# Patient Record
Sex: Male | Born: 1935 | Race: Black or African American | Hispanic: No | Smoking: Former smoker
Health system: Southern US, Community
[De-identification: ages and names within clinical notes are randomized; demographics above are authoritative.]

## PROBLEM LIST (undated history)

## (undated) DIAGNOSIS — I509 Heart failure, unspecified: Secondary | ICD-10-CM

## (undated) DIAGNOSIS — J449 Chronic obstructive pulmonary disease, unspecified: Secondary | ICD-10-CM

## (undated) DIAGNOSIS — N189 Chronic kidney disease, unspecified: Secondary | ICD-10-CM

## (undated) DIAGNOSIS — R0689 Other abnormalities of breathing: Secondary | ICD-10-CM

## (undated) DIAGNOSIS — I10 Essential (primary) hypertension: Secondary | ICD-10-CM

## (undated) DIAGNOSIS — K219 Gastro-esophageal reflux disease without esophagitis: Secondary | ICD-10-CM

## (undated) HISTORY — DX: Chronic obstructive pulmonary disease, unspecified: J44.9

## (undated) HISTORY — DX: Chronic kidney disease, unspecified: N18.9

## (undated) HISTORY — DX: Essential (primary) hypertension: I10

## (undated) HISTORY — DX: Other abnormalities of breathing: R06.89

---

## 2015-05-10 ENCOUNTER — Encounter: Payer: Self-pay | Admitting: Pulmonary Disease

## 2015-05-10 ENCOUNTER — Ambulatory Visit (INDEPENDENT_AMBULATORY_CARE_PROVIDER_SITE_OTHER)
Admission: RE | Admit: 2015-05-10 | Discharge: 2015-05-10 | Disposition: A | Discharge status: Home / Self Care | Payer: Medicare Other | Source: Ambulatory Visit | Attending: Pulmonary Disease | Admitting: Pulmonary Disease

## 2015-05-10 ENCOUNTER — Other Ambulatory Visit (INDEPENDENT_AMBULATORY_CARE_PROVIDER_SITE_OTHER): Payer: Medicare Other

## 2015-05-10 ENCOUNTER — Ambulatory Visit (INDEPENDENT_AMBULATORY_CARE_PROVIDER_SITE_OTHER): Payer: Medicare Other | Admitting: Pulmonary Disease

## 2015-05-10 VITALS — BP 152/82 | HR 78 | Ht 67.0 in | Wt 174.4 lb

## 2015-05-10 DIAGNOSIS — J42 Unspecified chronic bronchitis: Secondary | ICD-10-CM

## 2015-05-10 DIAGNOSIS — I10 Essential (primary) hypertension: Secondary | ICD-10-CM | POA: Insufficient documentation

## 2015-05-10 DIAGNOSIS — J449 Chronic obstructive pulmonary disease, unspecified: Secondary | ICD-10-CM | POA: Insufficient documentation

## 2015-05-10 LAB — COMPREHENSIVE METABOLIC PANEL
ALT: 13 U/L (ref 0–53)
AST: 16 U/L (ref 0–37)
Albumin: 4.4 g/dL (ref 3.5–5.2)
Alkaline Phosphatase: 73 U/L (ref 39–117)
BUN: 40 mg/dL — ABNORMAL HIGH (ref 6–23)
CALCIUM: 10.4 mg/dL (ref 8.4–10.5)
CHLORIDE: 103 meq/L (ref 96–112)
CO2: 27 meq/L (ref 19–32)
Creatinine, Ser: 2.51 mg/dL — ABNORMAL HIGH (ref 0.40–1.50)
GFR: 31.52 mL/min — AB (ref >60.00)
Glucose, Bld: 102 mg/dL — ABNORMAL HIGH (ref 70–99)
POTASSIUM: 4.6 meq/L (ref 3.5–5.1)
Sodium: 140 mEq/L (ref 135–145)
Total Bilirubin: 0.3 mg/dL (ref 0.2–1.2)
Total Protein: 8.2 g/dL (ref 6.0–8.3)

## 2015-05-10 NOTE — Progress Notes (Signed)
Subjective:    Patient ID: Greg RobertsJonathan Jones, male    DOB: 04/12/1928, 81 y.o.   MRN: [id number]  HPI  81 [place], presents to establish care for COPD accompanied by his sister. He smoked about half pack per day for 70 years-about 35 pack years before he quit in 2014. He was admitted for a pneumonia to [facility] in 10/2014 and was seen by pulmonologist there. He was diagnosed with severe COPD. PFTs 10/2014 showed FEV1 of 0.84-39%, FVC of 2.1-67%, without significant broncho-dilator response. DLCO was relatively preserved at 73%. His wife passed away in June, and he moved to [place] to be with his sister. He also has hypertension and is maintained on benazepril. He also has CK D stage V per report  He reports dyspnea on walking stairs, can walk in the store but cannot walk for extended distance. Reports occasional wheezing. He reports a daily cough productive of clear sputum.  Spirometry today showed FEV1 of 34%-0.81 He is maintained on a regimen of Symbicort and Spiriva and uses albuterol on an as-needed basis. He reports some kind of reaction when he took albuterol nebs in the hospital and prefers not to use this  Past Medical History  Diagnosis Date  . COPD (chronic obstructive pulmonary disease) (HCC)   . Hypertension   . Chronic kidney disease   . Respiratory insufficiency     No past surgical history on file.  Allergies  Allergen Reactions  . Other     Oxygen Sensitive  . Penicillins Rash  . Vitamin B12 Itching and Rash    Social History   Social History  . Marital Status: Widowed    Spouse Name: N/A  . Number of Children: N/A  . Years of Education: N/A   Occupational History  . Not on file.   Social History Main Topics  . Smoking status: Former Smoker -- 0.50 packs/day for 70 years    Types: Cigarettes    Quit date: 04/19/2013  . Smokeless tobacco: Not on file  . Alcohol Use: No  . Drug Use: No    . Sexual Activity: Not on file   Other Topics Concern  . Not on file   Social History Narrative  . No narrative on file   Family history- sister is healthy, no CAD No family history on file.   Review of Systems  Constitutional: Negative for fever, chills, activity change, appetite change and unexpected weight change.  HENT: Negative for congestion, dental problem, postnasal drip, rhinorrhea, sneezing, sore throat, trouble swallowing and voice change.   Eyes: Negative for visual disturbance.  Respiratory: Positive for shortness of breath. Negative for cough and choking.   Cardiovascular: Negative for chest pain and leg swelling.  Gastrointestinal: Negative for nausea, vomiting and abdominal pain.  Genitourinary: Negative for difficulty urinating.  Musculoskeletal: Negative for arthralgias.  Skin: Negative for rash.  Psychiatric/Behavioral: Negative for behavioral problems and confusion.       Objective:   Physical Exam  Gen. Pleasant, well-nourished, in no distress, normal affect ENT - no lesions, no post nasal drip Neck: No JVD, no thyromegaly, no carotid bruits Lungs: no use of accessory muscles, no dullness to percussion, decreased bilateral without rales or rhonchi  Cardiovascular: Rhythm regular, heart sounds  normal, no murmurs or gallops, no peripheral edema Abdomen: soft and non-tender, no hepatosplenomegaly, BS normal. Musculoskeletal: No deformities, no cyanosis or clubbing Neuro:  alert, non focal  Assessment & Plan:

## 2015-05-10 NOTE — Assessment & Plan Note (Signed)
You have severe COPD STOP lotensin-HCTZ - we will substitute different medication Blood work & CXR today OK to take mucinex 600 mg twice daily ( or cough syrup 5ml twice daily) We discussed signs of a flare up You will benefit from a rehab program

## 2015-05-10 NOTE — Patient Instructions (Signed)
You have severe COPD STOP lotensin-HCTZ - we will substitute different medication Blood work & CXR today OK to take mucinex 600 mg twice daily ( or cough syrup 5ml twice daily) We discussed signs of a flare up You will benefit from a rehab program 

## 2015-05-10 NOTE — Assessment & Plan Note (Signed)
Blood pressure is slight high today We'll check creatinine-he also has a chronic cough-hence benazepril will need to be stopped If creatinine is okay, we will substitute with ARB, otherwise we will have to use amlodipine

## 2015-05-11 LAB — CBC WITH DIFFERENTIAL/PLATELET
Basophils Absolute: 0.1 10*3/uL (ref 0.0–0.1)
Basophils Relative: 0.6 % (ref 0.0–3.0)
EOS ABS: 0.4 10*3/uL (ref 0.0–0.7)
EOS PCT: 4 % (ref 0.0–5.0)
HEMATOCRIT: 43.1 % (ref 39.0–52.0)
Hemoglobin: 14 g/dL (ref 13.0–17.0)
LYMPHS PCT: 22.2 % (ref 12.0–46.0)
Lymphs Abs: 2.2 10*3/uL (ref 0.7–4.0)
MCHC: 32.4 g/dL (ref 30.0–36.0)
MCV: 82.9 fl (ref 78.0–100.0)
MONOS PCT: 9.2 % (ref 3.0–12.0)
Monocytes Absolute: 0.9 10*3/uL (ref 0.1–1.0)
NEUTROS ABS: 6.4 10*3/uL (ref 1.4–7.7)
Neutrophils Relative %: 64 % (ref 43.0–77.0)
PLATELETS: 292 10*3/uL (ref 150.0–400.0)
RBC: 5.2 Mil/uL (ref 4.22–5.81)
RDW: 16.2 % — AB (ref 11.5–15.5)
WBC: 10.1 10*3/uL (ref 4.0–10.5)

## 2015-05-14 MED ORDER — AMLODIPINE BESYLATE 10 MG PO TABS: 10.0000 mg | ORAL_TABLET | Freq: Every day | ORAL | Status: DC

## 2015-05-17 ENCOUNTER — Telehealth: Payer: Self-pay | Admitting: Pulmonary Disease

## 2015-05-17 NOTE — Telephone Encounter (Signed)
[facility]. They received order on pt but since pt does not have PFT's with post bronch then 1) pt will need to be set up for PFT w/ post bronch or 2) change DX to emphysema. Please advise RA thanks

## 2015-05-18 NOTE — Telephone Encounter (Signed)
Called and spoke with Kirt BoysMolly Faxed a copy of the PFT w/ Post Bronch to [facility]. Nothing further needed. Closing encounter

## 2015-05-18 NOTE — Telephone Encounter (Signed)
PFTs with post bronch available (from diff hospital ) - pl look at scan folder & send them copy

## 2015-05-24 ENCOUNTER — Telehealth (HOSPITAL_COMMUNITY): Payer: Self-pay

## 2015-05-24 NOTE — Telephone Encounter (Signed)
Called patient to discuss Pulmonary Rehab referral. Patient is interested and we are currently verifying insurance.  Will follow up with patient once insurance is verified.

## 2015-05-28 ENCOUNTER — Encounter (HOSPITAL_COMMUNITY): Payer: Self-pay

## 2015-05-28 ENCOUNTER — Encounter (HOSPITAL_COMMUNITY)
Admission: RE | Admit: 2015-05-28 | Discharge: 2015-05-28 | Disposition: A | Discharge status: Home / Self Care | Payer: Medicare Other | Source: Ambulatory Visit | Attending: Pulmonary Disease | Admitting: Pulmonary Disease

## 2015-05-28 DIAGNOSIS — J449 Chronic obstructive pulmonary disease, unspecified: Secondary | ICD-10-CM | POA: Insufficient documentation

## 2015-05-28 DIAGNOSIS — K219 Gastro-esophageal reflux disease without esophagitis: Secondary | ICD-10-CM | POA: Insufficient documentation

## 2015-05-28 HISTORY — DX: Gastro-esophageal reflux disease without esophagitis: K21.9

## 2015-05-28 NOTE — Progress Notes (Signed)
Greg RobertsJonathan Jones 81 y.o. male Pulmonary Rehab Orientation Note 13:30-16:00 Patient arrived today in Cardiac and Pulmonary Rehab for orientation to Pulmonary Rehab. He was transported [facility] via wheel chair accompanied by his sister. He has not been prescribed oxygen for home use. Color good, skin warm and dry. Patient is oriented to time and place. Patient's medical history, psychosocial health, and medications reviewed. Psychosocial assessment reveals pt his sister. [place]. His wife died in June of this year and he decided to move to [place] to be closer to his sisters. He has one biological son who he talks with every day that still lives in [place]. He [place] the [place] [facility] [place]. Pt is currently retired. 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 Pt states he really has no hobbies. He denies depression but lays in bed most of the day and watches TV. He is hopeful that he will make new friends in pulmonary rehab.  Pt reports his stress level is low. Areas of stress/anxiety include Health.  Pt does exhibits signs of depression. Signs of depression include hypersomnia. PHQ2/9 score 0/0. Pt shows good  coping skills with positive outlook . He was offered emotional support and reassurance. Will continue to monitor and evaluate progress toward psychosocial goal(s). Physical assessment reveals heart rate is normal, S1S2 present. Breath sounds clear to auscultation, no wheezes, rales, or rhonchi, moderate to severe expiratory wheezing heard diffusely throughout both lungs. He was encouraged to use his rescue inhaler during assessment. It is noted that patient did not use inhaler properly. Technique reviewed and patient able to return successful demonstration for second puff. Wheezing improved after use and patient stated his breathing felt much better. Grip strength equal, strong. Distal pulses palpable. No edema  noted. Patient reports he does take medications as prescribed. Patient states he follows a Regular diet. The patient reports no specific efforts to gain or lose weight.. Patient's weight will be monitored closely. Demonstration and practice of PLB using pulse oximeter. Patient able to return demonstration satisfactorily. Safety and hand hygiene in the exercise area reviewed with patient. Patient voices understanding of the information reviewed. Department expectations discussed with patient and achievable goals were set. The patient shows enthusiasm about attending the program and we look forward to working with this nice gentleman. The patient is scheduled for a 6 min walk test on Tuesday 12/13 at 3:30, a face to face with the pulmonary rehab medical director on Thursday 12/15 at 10:30 and to begin exercise on Tuesday 12/20 in the 10:30 class.   45 minutes was spent on a variety of activities such as assessment of the patient, obtaining baseline data including height, weight, BMI, and grip strength, verifying medical history, allergies, and current medications, and teaching patient strategies for performing tasks with less respiratory effort with emphasis on pursed lip breathing.

## 2015-06-01 ENCOUNTER — Encounter (HOSPITAL_COMMUNITY)
Admission: RE | Admit: 2015-06-01 | Discharge: 2015-06-01 | Disposition: A | Discharge status: Home / Self Care | Payer: Medicare Other | Source: Ambulatory Visit | Attending: Pulmonary Disease | Admitting: Pulmonary Disease

## 2015-06-01 NOTE — Progress Notes (Addendum)
Greg Jones completed a Six-Minute Walk Test on 06/01/15 . Greg Jones walked 516 feet with 2 breaks lasting 1 minute and 15 seconds total.  The patient's lowest oxygen saturation was 93 %, highest heart rate was 124 bpm , and highest blood pressure was 190/80. The patient was on room air. Patient stated that nothing hindered their walk test.

## 2015-06-03 ENCOUNTER — Encounter (HOSPITAL_COMMUNITY)
Admission: RE | Admit: 2015-06-03 | Discharge: 2015-06-03 | Disposition: A | Discharge status: Home / Self Care | Payer: Medicare Other | Source: Ambulatory Visit | Attending: Pulmonary Disease | Admitting: Pulmonary Disease

## 2015-06-03 NOTE — Outcomes Assessment (Cosign Needed)
[facility] Baseline Outcomes Assessment   Anthropometrics:  . Height (inches): 67.5 in . Weight (kg): 80.2 kg . Grip strength was measured using a Dynamometer.  The patient's highest score was a 35.  Functional Status/Exercise Capacity: . Greg Jones had a resting heart rate of 93 BPM, a resting blood pressure of 178/70, and an oxygen saturation of 98 % on RA liters of O2.  Greg Jones performed a 6-minute walk test on 06/01/15.  The patient completed 516 feet in 6 minutes with 2 rest breaks.  This quantifies 1.92 METS.   Dyspnea Measures: . The [facility] is a simple and standardized method of classifying disability in patients with COPD.  The assessment correlates disability and dyspnea.  At entrance the patient scored a 4. The scale is provided below.   0= I only get breathless with strenuous exercise. 1= I get short of breath when hurrying on level ground or walking up a slight incline. 2= On level ground, I walk slower than people of the same age because of breathlessness, or have to stop for breath when walking at my own pace. 3= I stop for breath after walking 100 yards or after a few minutes on level ground. 4=I am too breathless to leave the house or I am breathless when dressing.   . The patient completed the [facility] Shortness Of Breath Questionnaire ([facility] [place]).  This questionnaire relates activities of daily living and shortness of breath.  The score ranges from 0-120, a higher score relates to severe shortness of breath during activities of daily living. The patient's score at entrance was 86.  Quality of Life: . Ferrans and Powers Quality of Life Index Pulmonary Version is used to assess the patients satisfaction in different domains of their life; health and functioning, socioeconomic, psychological/spiritual, and family. The overall score is recorded out of 30 points.  The patient's goal is to achieve an overall  score of 21 or higher.  [place] received a 18.00 at entrance.  . The Patient Health Questionnaire (PHQ-2) is a first step approach for the screening of depression.  If the patient scores positive on the PHQ-2 the patient should be further assessed with the PHQ-9.  The Patient Health Questionnaire (PHQ-9) assesses the degree of depression.  Depression is important to monitor and track in pulmonary patients due to its prevalence in the population.  If the patient advances to the PHQ-9 the goal is to score less than 4 on this assessment.  [place] scored a 0 at entrance.  Clinical Assessment Tools: . The COPD Assessment Test (CAT) is a measurement tool to quantify how much of an impact the disease has on the patient's life.  This assessment aids the Pulmonary Rehab Team in designing the patients individualized treatment plan.  A CAT score ranges from 0-40.  A score of 10 or below indicates that COPD has a low impact on the patient's life whereas a score of 30 or higher indicates a severe impact. The patient's goal is a decrease of 1 point from entrance to discharge.  Greg Jones had a CAT score of 33 at entrance.  Nutrition: . The "Rate My Plate" is a dietary assessment that quantifies the balance of a patient's diet.  This tool allows the Pulmonary Rehab Team to key in on the areas of the patient's diet that needs improving.  The team can then focus their nutritional education on those areas.  If the patient scores 24-40, this means  there are many ways they can make their eating habits healthier, 41-57 states that there are some ways they can make their eating habits healthier and a score of 58-72 states that they are making many healthy choices.  The patient's goal is to achieve a score of 49 or higher on this assessment.  Greg Jones scored a 44 at entrance.  Oxygen Compliance: . Patient is currently on RA at rest, RA at night, and RA for exercise.  Greg Jones has currently not been prescribed a cpap/bipap at  night.  Education: . Greg Jones will attend education classes during the course of Pulmonary Rehab.  Education classes that will be offered to the patient are Activities of Daily Living and Energy Conservation, Pursed Lip Breathing and Diaphragmatic Breathing, Nutrition, Exercise for the Pulmonary Patient, Warning Signs of Infection, Chronic Lung Disease, Advanced Directives, Medications, and Stress and Meditation.  The patient completed an assessment at the entrance of the program and will complete it again upon discharge to demonstrate the level of understanding provided by the educational classes.  This assessment includes 14 questions regarding all of the education topics above.  Greg Jones achieved a score of 13/13 at entrance.  Smoking Cessation:  Patient is not currently a smoker.  Exercise: . Greg Jones will be provided with an individualized Home Exercise Prescription (HEP) at the entrance of the program.  The patient will be followed by the Pulmonary Exercise Physiologist throughout the program to assist with the progression of the frequency, intensity, time, and type of exercise. The patient's long-term goal is to be exercising 30-60 minutes, 3-5 days per week. At entrance, the patient was exercising 0 days at home.

## 2015-06-03 NOTE — Progress Notes (Signed)
Initial Pulmonary Rehab Evaluation  S: Patient reports able to walk about 100 feet before having to stop for SOB.  No oxygen at home.  Reason for stopping is SOB only, no CP and no joint problem.  O:  Chronically ill appearing male. HEENT: [place]/AT, PERRL, EOM-I and MMM. Heart: RRR, Nl S1/S2, -M/R/G. Lung: Diminished BS diffusely. Abd: Soft, NT, ND and +BS. Ext: -edema and -tenderness.  CXR: I reviewed myself, hyperinflation.  A/P: 81 year old male with COPD presenting for pulmonary rehab, only limitation is SOB, no CP or joint discomfort.  May proceed with pulmonary rehab plans.  Alyson ReedyWesam G. Yacoub, M.D. [facility]monary/[facility] Care Medicine. Pager: [phone]. After hours pager: [phone].

## 2015-06-07 ENCOUNTER — Ambulatory Visit (INDEPENDENT_AMBULATORY_CARE_PROVIDER_SITE_OTHER): Payer: Medicare Other | Admitting: Adult Health

## 2015-06-07 ENCOUNTER — Encounter: Payer: Self-pay | Admitting: Adult Health

## 2015-06-07 VITALS — BP 116/70 | HR 84 | Temp 98.4°F | Ht 67.0 in | Wt 178.0 lb

## 2015-06-07 DIAGNOSIS — I10 Essential (primary) hypertension: Secondary | ICD-10-CM

## 2015-06-07 DIAGNOSIS — J449 Chronic obstructive pulmonary disease, unspecified: Secondary | ICD-10-CM

## 2015-06-07 NOTE — Assessment & Plan Note (Signed)
Severe COPD  Plan  Avoid ACE inhibitors if possible in future  Continue on Symbicort and Spiriva daily .  Begin Pulmonary rehab as planned.  Follow up with Primary MD for blood pressure management .  Follow up with Dr. Vassie LollAlva  In 3-4 months and As needed.

## 2015-06-07 NOTE — Assessment & Plan Note (Signed)
Blood pressure is controlled on Norvasc. avoid ACE inhibitor's if possible Follow-up primary care physician in the next few weeks for ongoing blood pressure management.

## 2015-06-07 NOTE — Patient Instructions (Addendum)
Continue on Symbicort and Spiriva daily .  Begin Pulmonary rehab as planned.  Follow up with Primary MD for blood pressure management .  Follow up with Dr. Vassie LollAlva  In 3-4 months and As needed.

## 2015-06-07 NOTE — Progress Notes (Signed)
Subjective:    Patient ID: Greg RobertsJonathan Jones, male    DOB: 11/03/1927, 81 y.o.   MRN: [id number]  HPI 81 year old male former smoker with severe COPD seen for pulmonary consult 05/10/2015 by Dr. Vassie LollAlva  TEST :  PFTs 10/2014 showed FEV1 of 0.84-39%, FVC of 2.1-67%, without significant broncho-dilator response. DLCO was relatively preserved at 73%. 04/2015 Spirometry today showed FEV1 of 34%-0.81  06/07/2015 Follow up : COPD  Patient returns for a one-month follow-up. Patient was seen last visit for severe COPD. FEV1 is at 34%.[name]. She was started on Symbicort and Spiriva.. Was changed off of his ACE inhibitor to Norvasc. Blood pressure is well-controlled. Patient does have known chronic stage IV kidney disease reported. His creatinine was 2.5. Patient says his cough might be slightly improved since last visit. We discussed follow up with his primary care doctor for further blood pressure management. Labs from last visit. Will be faxed to his primary care physician . Patient says overall he is doing okay. He does get winded with activity. Has a daily cough. He denies any hemoptysis, orthopnea, PND or leg swelling. Does request a [facility] to the [facility] for a pulse oximeter and walker. He is beginning pulmonary rehabilitation tomorrow.   Past Medical History  Diagnosis Date  . COPD (chronic obstructive pulmonary disease) (HCC)   . Hypertension   . Chronic kidney disease   . Respiratory insufficiency   . GERD (gastroesophageal reflux disease)    Current Outpatient Prescriptions on File Prior to Visit  Medication Sig Dispense Refill  . amLODipine (NORVASC) 10 MG tablet Take 1 tablet (10 mg total) by mouth daily. 30 tablet 1  . budesonide-formoterol (SYMBICORT) 160-4.5 MCG/ACT inhaler Inhale 2 puffs into the lungs 2 (two) times daily.    . cholecalciferol (VITAMIN D) 400 UNITS TABS tablet Take 400 Units by mouth 2 (two) times daily.    [name]. omeprazole (PRILOSEC) 20 MG capsule Take 20 mg by  mouth daily.    [name]. tiotropium (SPIRIVA) 18 MCG inhalation capsule Place 18 mcg into inhaler and inhale daily.    [name]. albuterol (PROVENTIL) (2.5 MG/3ML) 0.083% nebulizer solution Take 2.5 mg by nebulization every 6 (six) hours as needed for wheezing or shortness of breath. Reported on 06/07/2015    . prochlorperazine (COMPAZINE) 5 MG tablet Take 5 mg by mouth every 6 (six) hours as needed for nausea or vomiting. Reported on 06/07/2015     No current facility-administered medications on file prior to visit.      Review of Systems Constitutional:   No  weight loss, night sweats,  Fevers, chills,  +fatigue, or  lassitude.  HEENT:   No headaches,  Difficulty swallowing,  Tooth/dental problems, or  Sore throat,                No sneezing, itching, ear ache, nasal congestion, post nasal drip,   CV:  No chest pain,  Orthopnea, PND, swelling in lower extremities, anasarca, dizziness, palpitations, syncope.   GI  No heartburn, indigestion, abdominal pain, nausea, vomiting, diarrhea, change in bowel habits, loss of appetite, bloody stools.   Resp:    No chest wall deformity  Skin: no rash or lesions.  GU: no dysuria, change in color of urine, no urgency or frequency.  No flank pain, no hematuria   MS:  No joint pain or swelling.  No decreased range of motion.  No back pain.  Psych:  No change in mood or affect. No depression or anxiety.  No memory loss.         Objective:   Physical Exam  GEN: A/Ox3; pleasant , NAD, [name] chronically ill-appearing  HEENT:  [place]/AT,  EACs-clear, TMs-wnl, NOSE-clear, THROAT-clear, no lesions, no postnasal drip or exudate noted.   NECK:  Supple w/ fair ROM; no JVD; normal carotid impulses w/o bruits; no thyromegaly or nodules palpated; no lymphadenopathy.  RESP  Decreased BS in bases .no accessory muscle use, no dullness to percussion  CARD:  RRR, no m/r/g  , no peripheral edema, pulses intact, no cyanosis or clubbing.  GI:   Soft & nt; nml bowel  sounds; no organomegaly or masses detected.  Musco: Warm bil, no deformities or joint swelling noted.   Neuro: alert, no focal deficits noted.    Skin: Warm, no lesions or rashes '      Assessment & Plan:

## 2015-06-08 ENCOUNTER — Encounter (HOSPITAL_COMMUNITY)
Admission: RE | Admit: 2015-06-08 | Discharge: 2015-06-08 | Disposition: A | Discharge status: Home / Self Care | Payer: Medicare Other | Source: Ambulatory Visit | Attending: Pulmonary Disease | Admitting: Pulmonary Disease

## 2015-06-08 DIAGNOSIS — J449 Chronic obstructive pulmonary disease, unspecified: Secondary | ICD-10-CM | POA: Diagnosis present

## 2015-06-09 ENCOUNTER — Ambulatory Visit: Payer: Medicare Other | Admitting: Adult Health

## 2015-06-10 ENCOUNTER — Encounter (HOSPITAL_COMMUNITY)
Admission: RE | Admit: 2015-06-10 | Discharge: 2015-06-10 | Disposition: A | Discharge status: Home / Self Care | Payer: Medicare Other | Source: Ambulatory Visit | Attending: Pulmonary Disease | Admitting: Pulmonary Disease

## 2015-06-10 DIAGNOSIS — J449 Chronic obstructive pulmonary disease, unspecified: Secondary | ICD-10-CM | POA: Diagnosis not present

## 2015-06-10 NOTE — Progress Notes (Signed)
Today, Greg Jones exercised at [facility]. Service time was from 10:30am to 12:00pm.  The patient exercised by performing aerobic, strengthening, and stretching exercises. Oxygen saturation, heart rate, blood pressure, rate of perceived exertion, and shortness of breath were all monitored before, during, and after exercise. Greg Jones presented with no problems at today's exercise session. The patient attended education today with [facility]ston Trevino on Pursed Lip and Diaphragmatic Breathing.  The patient did not have an increase in workload intensity during today's exercise session.  Pre-exercise vitals: . Weight kg: 80.8 . Liters of O2: ra . SpO2: 95 . HR: 91 . BP: 124/62 . CBG: na  Exercise vitals: . Highest heartrate:  114 . Lowest oxygen saturation: 96 . Highest blood pressure: 118/70 . Liters of 02: ra  Post-exercise vitals: . SpO2: 99 . HR: 97 . BP: 112/70 . Liters of O2: ra . CBG: na  Dr. Alyson ReedyWesam G. Yacoub, Medical Director Dr. Gonzella Lexhungel is immediately available during today's Pulmonary Rehab session for Greg Jones on 06/10/15 at 10:30am class time.

## 2015-06-15 ENCOUNTER — Encounter (HOSPITAL_COMMUNITY)
Admission: RE | Admit: 2015-06-15 | Discharge: 2015-06-15 | Disposition: A | Discharge status: Home / Self Care | Payer: Medicare Other | Source: Ambulatory Visit | Attending: Pulmonary Disease | Admitting: Pulmonary Disease

## 2015-06-15 DIAGNOSIS — J449 Chronic obstructive pulmonary disease, unspecified: Secondary | ICD-10-CM | POA: Diagnosis not present

## 2015-06-15 NOTE — Progress Notes (Addendum)
Today, Greg Jones exercised at [facility]. Service time was from 1030 to 1155.  The patient exercised by performing aerobic, strengthening, and stretching exercises. Oxygen saturation, heart rate, blood pressure, rate of perceived exertion, and shortness of breath were all monitored before, during, and after exercise. Greg Jones presented with no problems at today's exercise session.  The patient did have an increase in workload intensity during today's exercise session.  Pre-exercise vitals: . Weight kg: 81.2 . Liters of O2: ra . SpO2: 94 . HR: 86 . BP: 120/54 . CBG: na  Exercise vitals: . Highest heartrate:  108 . Lowest oxygen saturation: 94 . Highest blood pressure: 126/70 . Liters of 02: ra  Post-exercise vitals: . SpO2: 93 . HR: 99 . BP: 104/62 . Liters of O2: ra . CBG: na  Dr. Alyson ReedyWesam G. Yacoub, Medical Director Dr. Konrad DoloresMerrell is immediately available during today's Pulmonary Rehab session for Greg Jones on 06/15/2015 at 1030 class time

## 2015-06-17 ENCOUNTER — Encounter (HOSPITAL_COMMUNITY)
Admission: RE | Admit: 2015-06-17 | Discharge: 2015-06-17 | Disposition: A | Discharge status: Home / Self Care | Payer: Medicare Other | Source: Ambulatory Visit | Attending: Pulmonary Disease | Admitting: Pulmonary Disease

## 2015-06-17 DIAGNOSIS — J449 Chronic obstructive pulmonary disease, unspecified: Secondary | ICD-10-CM | POA: Diagnosis not present

## 2015-06-17 NOTE — Progress Notes (Addendum)
Today, Greg Jones exercised at [facility]. Service time was from 10:30am to 12:20pm.  The patient exercised by performing aerobic, strengthening, and stretching exercises. Oxygen saturation, heart rate, blood pressure, rate of perceived exertion, and shortness of breath were all monitored before, during, and after exercise. Greg Jones presented with no problems at today's exercise session. The patient attended education class on Nutrition. Greg Jones also met with Dr. Nelda Marseille, Medical Director for Pulmonary rehab for his 30 day face to face.   The patient did not have an increase in workload intensity during today's exercise session.  Pre-exercise vitals: . Weight kg: 81.1 . Liters of O2: ra . SpO2: 98 . HR: 98 . BP: 128/52 . CBG: na  Exercise vitals: . Highest heartrate:  120 . Lowest oxygen saturation: 93 . Highest blood pressure: 134/80 . Liters of 02: ra  Post-exercise vitals: . SpO2: 95 . HR: 101 . BP: 110/66 . Liters of O2: ra . CBG: na  Dr. Rush Farmer, Medical Director Dr. Darrick Meigs is immediately available during today's Pulmonary Rehab session for Greg Jones on 06/17/15 at 10:30am class time

## 2015-06-22 ENCOUNTER — Encounter (HOSPITAL_COMMUNITY)
Admission: RE | Admit: 2015-06-22 | Discharge: 2015-06-22 | Disposition: A | Discharge status: Home / Self Care | Payer: Medicare Other | Source: Ambulatory Visit | Attending: Pulmonary Disease | Admitting: Pulmonary Disease

## 2015-06-22 DIAGNOSIS — J449 Chronic obstructive pulmonary disease, unspecified: Secondary | ICD-10-CM | POA: Diagnosis present

## 2015-06-22 NOTE — Progress Notes (Signed)
Reviewed & agree with plan  

## 2015-06-22 NOTE — Progress Notes (Signed)
Today, Greg Jones exercised at [facility]. Service time was from 10:30am to 12:05pm.  The patient exercised by performing aerobic, strengthening, and stretching exercises. Oxygen saturation, heart rate, blood pressure, rate of perceived exertion, and shortness of breath were all monitored before, during, and after exercise. Greg Jones presented with no problems at today's exercise session.  The patient did not have an increase in workload intensity during today's exercise session.  Pre-exercise vitals: . Weight kg: 81.6 . Liters of O2: ra . SpO2: 94 . HR: 96 . BP: 142/74 . CBG: na  Exercise vitals: . Highest heartrate:  109 . Lowest oxygen saturation: 94 . Highest blood pressure: 144/84 . Liters of 02: ra  Post-exercise vitals: . SpO2: 94 . HR: 104 . BP: 134/60 . Liters of O2: ra . CBG: na  Dr. Alyson ReedyWesam G. Yacoub, Medical Director Dr. Konrad DoloresMerrell is immediately available during today's Pulmonary Rehab session for Greg Jones on 06/22/15 at 10:30am class time

## 2015-06-24 ENCOUNTER — Encounter (HOSPITAL_COMMUNITY)
Admission: RE | Admit: 2015-06-24 | Discharge: 2015-06-24 | Disposition: A | Discharge status: Home / Self Care | Payer: Medicare Other | Source: Ambulatory Visit | Attending: Pulmonary Disease | Admitting: Pulmonary Disease

## 2015-06-24 DIAGNOSIS — J449 Chronic obstructive pulmonary disease, unspecified: Secondary | ICD-10-CM | POA: Diagnosis not present

## 2015-06-24 NOTE — Progress Notes (Signed)
Today, Greg Jones exercised at [facility]. Service time was from 1030 to 1200.  The patient exercised by performing aerobic, strengthening, and stretching exercises. Oxygen saturation, heart rate, blood pressure, rate of perceived exertion, and shortness of breath were all monitored before, during, and after exercise. Greg Jones presented with no problems at today's exercise session. Greg Jones also attended an education session on risk factor reduction.  The patient did have an increase in workload intensity during today's exercise session.  Pre-exercise vitals: . Weight kg: 81.3 . Liters of O2: ra . SpO2: 96 . HR: 96 . BP: 108/72 . CBG: na  Exercise vitals: . Highest heartrate:  111 . Lowest oxygen saturation: 97 . Highest blood pressure: 108/70 . Liters of 02: ra  Post-exercise vitals: . SpO2: 94 . HR: 95 . BP: 136/64 after albuterol use . Liters of O2: ra . CBG: na  Dr. Alyson ReedyWesam G. Yacoub, Medical Director Dr. Konrad DoloresMerrell is immediately available during today's Pulmonary Rehab session for Cherlyn RobertsJonathan Dukes on 06/24/2015 at 1030 class time

## 2015-06-25 ENCOUNTER — Telehealth: Payer: Self-pay | Admitting: Pulmonary Disease

## 2015-06-25 MED ORDER — PREDNISONE 10 MG PO TABS: ORAL_TABLET | ORAL | Status: DC

## 2015-06-25 NOTE — Telephone Encounter (Signed)
Called spoke with spouse and she is aware of recs. RX for prednisone sent in. Nothing further needed

## 2015-06-25 NOTE — Telephone Encounter (Signed)
Called spoke with spouse. She reports pt has been having increase SOB w/ exertion, prod cough (clear phlem), wheezing but no chest tx. They are wanting an RX called in to have on hand since snow is expected. RA NA. Please advise MW thanks

## 2015-06-25 NOTE — Telephone Encounter (Signed)
Prednisone 10 mg take  4 each am x 2 days,   2 each am x 2 days,  1 each am x 2 days and stop  

## 2015-06-28 ENCOUNTER — Telehealth (HOSPITAL_COMMUNITY): Payer: Self-pay | Admitting: *Deleted

## 2015-06-29 ENCOUNTER — Encounter (HOSPITAL_COMMUNITY): Admission: RE | Admit: 2015-06-29 | Payer: Medicare Other | Source: Ambulatory Visit

## 2015-06-29 NOTE — Progress Notes (Signed)
Late Entry  Today, Greg Jones exercised at [facility]. Service time was from 1030 to 1210.  The patient exercised by performing aerobic, strengthening, and stretching exercises. Oxygen saturation, heart rate, blood pressure, rate of perceived exertion, and shortness of breath were all monitored before, during, and after exercise. Greg Jones presented with no problems at today's exercise session.  The patient did not have an increase in workload intensity during today's exercise session.  Pre-exercise vitals: . Weight kg: 81.1 . Liters of O2: RA . SpO2: 95 . HR: 59 . BP: 108/72 . CBG: NA  Exercise vitals: . Highest heartrate:  111 . Lowest oxygen saturation: 91 . Highest blood pressure: 108/70 . Liters of 02: RA  Post-exercise vitals: . SpO2: 94 . HR: 95 . BP: 136/64 . Liters of O2: RA . CBG: NA Dr. Alyson ReedyWesam G. Yacoub, Medical Director Dr. Konrad DoloresMerrell is immediately available during today's Pulmonary Rehab session for Greg RobertsJonathan Jones on 12/20/2016at 1030 class time  .  .[name]

## 2015-07-01 ENCOUNTER — Encounter (HOSPITAL_COMMUNITY)
Admission: RE | Admit: 2015-07-01 | Discharge: 2015-07-01 | Disposition: A | Discharge status: Home / Self Care | Payer: Medicare Other | Source: Ambulatory Visit | Attending: Pulmonary Disease | Admitting: Pulmonary Disease

## 2015-07-01 DIAGNOSIS — J449 Chronic obstructive pulmonary disease, unspecified: Secondary | ICD-10-CM | POA: Diagnosis not present

## 2015-07-01 NOTE — Progress Notes (Signed)
Today, Greg Jones exercised at [facility]. Service time was from 1030 to 1215.  The patient exercised by performing aerobic, strengthening, and stretching exercises. Oxygen saturation, heart rate, blood pressure, rate of perceived exertion, and shortness of breath were all monitored before, during, and after exercise. Greg Jones presented with no problems at today's exercise session. He attended stress management class today.  The patient did not have an increase in workload intensity during today's exercise session.  Pre-exercise vitals: . Weight kg: 86.8 . Liters of O2: RA . SpO2: 96 . HR: 89 . BP: 139/60 . CBG: NA  Exercise vitals: . Highest heartrate:  107 . Lowest oxygen saturation: 93 . Highest blood pressure: 142/70 . Liters of 02: RA  Post-exercise vitals: . SpO2: 97 . HR: 88 . BP: 130/70 . Liters of O2: RA . CBG: NA Dr. Alyson ReedyWesam G. Yacoub, Medical Director Dr. Randol KernElgergawy is immediately available during today's Pulmonary Rehab session for Greg Jones on 07/01/2015  at 1030 class time.  .[name]

## 2015-07-01 NOTE — Progress Notes (Signed)
Greg RobertsJonathan Jones 82 y.o. male Nutrition Note Spoke with pt and pt's sister. There are some ways the pt can make his eating habits healthier. Pt's Rate Your Plate results reviewed with pt. Age-appropriate nutrition recommendations discussed.  No results found for: HGBA1C  Nutrition Diagnosis ? Food-and nutrition-related knowledge deficit related to lack of exposure to information as related to diagnosis of pulmonary disease ?  Nutrition Intervention ? Pt's individual nutrition plan and goals reviewed with pt. ? Benefits of adopting healthy eating habits discussed when pt's Rate Your Plate reviewed. ? Pt to attend the Nutrition and Lung Disease class ? Continual client-centered nutrition education by RD, as part of interdisciplinary care. Goal(s) 1. Describe the benefit of including fruits, vegetables, whole grains, and low-fat dairy products in a healthy meal plan. Monitor and Evaluate progress toward nutrition goal with team.   Mickle PlumbEdna Jaedynn Bohlken, M.Ed, RD, LDN, CDE 07/01/2015 11:34 AM

## 2015-07-01 NOTE — Psychosocial Assessment (Signed)
Greg RobertsJonathan Jones 82 y.o. male  30 day Psychosocial Note  Patient psychosocial assessment reveals barriers to participation in Pulmonary Rehab, although he has only missed 1 session in the past 30 days and this was due to inclimate weather. Psychosocial areas that are currently affecting patient's rehab experience include concerns about treatment decisions and the progression of his COPD. He has also recently moved to the area and is living with his sister. He has not social connections to [place] friends in [place] except by phone. He does stated he has a loss of interest in usual activities, and does not get out of bed during the day except on rehab days. This is in part to his coughing and wheezing. He states he does not cough or wheeze as much when he is lying still. This will prevent him from following his home exercise instructions. He also has [facility] to discuss better treatment options to manage his wheezing and coughing. Patient does not to exhibit positive coping skills to deal with his psychosocial concerns.He verbalized that he does but his sister is concerned about how much he remains in bed and watches TV. They will also discuss depression symptoms with his primary care tomorrow. Offered emotional support and reassurance. Patient does not feel he is making progress toward Pulmonary Rehab goals. Patient reports his health and activity level has not improved in the past 30 days as evidenced by patient's report of unchanged ability to exert himself without significant shortness of breath and wheezing. Patient states his sister has not noticed changes in his activity or mood. Patient reports not feeling positive about current and projected progression in Pulmonary Rehab. After reviewing the patient's treatment plan, the patient is not making progress toward Pulmonary Rehab goals. Hopefully his coughing and wheezing can be managed with other treatment options  and he can began to exert himself with less respiratory effects which will greatly improve his psychosocial health. Patient's rate of progress toward rehab goals is poor at this time Plan of action to help patient continue to work towards rehab goals include working closely with MD to determine best treatment for patient and to increase workloads on equipment as tolerated in order to increase his stamina and stregnth. Will continue to monitor and evaluate progress toward psychosocial goal(s).  Goal(s) in progress: Improved management of stress, depression, anxiety related to his COPD  Improved coping skills  Help patient work toward returning to meaningful activities that improve patient's QOL and are attainable with patient's lung disease

## 2015-07-01 NOTE — Progress Notes (Signed)
Pt completed Quality of Life survey as a participant in Pulmonary Rehab. Scores 21.0 or below are considered low. Pt score very low in several areas Overall 18.0, Health and Function 12.4, family 16.5.  Discussed in detail with patient. Patient feels his disease has progressed to the point that exercise may not help, but if patient were to see an improvement in his shortness of breath and stamina and strength, he feels his quality of life would improve significantly. He has an appointment with his MD tomorrow to discuss possible medication changes that would improve the amount of wheezing he does with minimal exertion. Will continue to follow patients quality of life.

## 2015-07-06 ENCOUNTER — Encounter (HOSPITAL_COMMUNITY)
Admission: RE | Admit: 2015-07-06 | Discharge: 2015-07-06 | Disposition: A | Discharge status: Home / Self Care | Payer: Medicare Other | Source: Ambulatory Visit | Attending: Pulmonary Disease | Admitting: Pulmonary Disease

## 2015-07-06 DIAGNOSIS — J449 Chronic obstructive pulmonary disease, unspecified: Secondary | ICD-10-CM | POA: Diagnosis not present

## 2015-07-06 NOTE — Progress Notes (Signed)
I have reviewed a Home Exercise Prescription with Greg Jones . Greg Jones is walking in house and doing some bands for current exercise at home.  The patient was advised to walk 2-3 days a week for 30-45 minutes at home.  [name] and I discussed how to progress jis exercise prescription.  The patient stated that their goals were to be able to move around better without getting SOB.  The patient stated that they understand the exercise prescription.  We reviewed exercise guidelines, target heart rate during exercise, oxygen use, weather, home pulse oximeter, endpoints for exercise, and goals.  Patient is encouraged to come to me with any questions. I also discussed program briefly with sister.  I will continue to follow up with the patient to assist them with progression and safety.   Fabio Pierce, MA, ACSM RCEP

## 2015-07-06 NOTE — Progress Notes (Signed)
Today, Greg Jones exercised at [facility]. Service time was from 1030 to 1210.  The patient exercised by performing aerobic, strengthening, and stretching exercises. Oxygen saturation, heart rate, blood pressure, rate of perceived exertion, and shortness of breath were all monitored before, during, and after exercise. Greg Jones presented with no problems at today's exercise session.  The patient did  have an increase in workload intensity during today's exercise session.  Pre-exercise vitals: . Weight kg: 82.7 . Liters of O2: RA . SpO2: 96 . HR: 81 . BP: 140/60 . CBG: NA  Exercise vitals: . Highest heartrate:  91 . Lowest oxygen saturation: 98 . Highest blood pressure: 118/60 . Liters of 02: RA  Post-exercise vitals: . SpO2: 98 . HR: 85 . BP: 132/76 . Liters of O2: RA . CBG: NA Dr. Alyson Reedy, Medical Director [name] is immediately available during today's Pulmonary Rehab session for Greg Jones on .tdd  at 1030 class time  .

## 2015-07-08 ENCOUNTER — Encounter (HOSPITAL_COMMUNITY)
Admission: RE | Admit: 2015-07-08 | Discharge: 2015-07-08 | Disposition: A | Discharge status: Home / Self Care | Payer: Medicare Other | Source: Ambulatory Visit | Attending: Pulmonary Disease | Admitting: Pulmonary Disease

## 2015-07-08 ENCOUNTER — Ambulatory Visit: Payer: Medicare Other | Admitting: Pulmonary Disease

## 2015-07-08 DIAGNOSIS — J449 Chronic obstructive pulmonary disease, unspecified: Secondary | ICD-10-CM | POA: Diagnosis not present

## 2015-07-08 NOTE — Progress Notes (Signed)
Today, Orell exercised at [facility]. Service time was from 1030 to 1220.  The patient exercised by performing aerobic, strengthening, and stretching exercises. Oxygen saturation, heart rate, blood pressure, rate of perceived exertion, and shortness of breath were all monitored before, during, and after exercise. Andrew presented with no problems at today's exercise session. Devrin also attended an education session on pulmonary medications. Masaru also met with Dr. Ellin Goodie, medical director to discuss any needs, concerns of the patient.  The patient did not have an increase in workload intensity during today's exercise session.  Pre-exercise vitals: . Weight kg: 82.5 . Liters of O2: ra . SpO2: 95 . HR: 79 . BP: 116/60 . CBG: na  Exercise vitals: . Highest heartrate:  103 . Lowest oxygen saturation: 98 . Highest blood pressure: 132/80 . Liters of 02: ra  Post-exercise vitals: . SpO2: 96 . HR: 87 . BP: 124/60 . Liters of O2: ra . CBG: na  Dr. Rush Farmer, Medical Director Dr. Marily Memos is immediately available during today's Pulmonary Rehab session for Yunus Stoklosa on 07/08/2015 at 1030 class time

## 2015-07-13 ENCOUNTER — Encounter (HOSPITAL_COMMUNITY)
Admission: RE | Admit: 2015-07-13 | Discharge: 2015-07-13 | Disposition: A | Discharge status: Home / Self Care | Payer: Medicare Other | Source: Ambulatory Visit | Attending: Pulmonary Disease | Admitting: Pulmonary Disease

## 2015-07-13 DIAGNOSIS — J449 Chronic obstructive pulmonary disease, unspecified: Secondary | ICD-10-CM | POA: Diagnosis not present

## 2015-07-13 NOTE — Progress Notes (Signed)
Today, Jeramiah exercised at [facility]. Service time was from 1030 to 1200.  The patient exercised by performing aerobic, strengthening, and stretching exercises. Oxygen saturation, heart rate, blood pressure, rate of perceived exertion, and shortness of breath were all monitored before, during, and after exercise. Ramere presented with no problems at today's exercise session.  The patient did not have an increase in workload intensity during today's exercise session.  Pre-exercise vitals: . Weight kg: 81.9 . Liters of O2: RA . SpO2: 96 . HR: 89 . BP: 138/56 . CBG: NA  Exercise vitals: . Highest heartrate:  104 . Lowest oxygen saturation: 98 . Highest blood pressure: 132/70 . Liters of 02: RA  Post-exercise vitals: . SpO2: 96 . HR: 93 . BP: 118/60 . Liters of O2: RA . CBG: NA Dr. Alyson Reedy, Medical Director [name] is immediately available during today's Pulmonary Rehab session for Nahiem Dredge on 07/13/2015  at 1030 class time  .

## 2015-07-14 ENCOUNTER — Encounter: Payer: Self-pay | Admitting: Adult Health

## 2015-07-14 ENCOUNTER — Ambulatory Visit (INDEPENDENT_AMBULATORY_CARE_PROVIDER_SITE_OTHER): Payer: Medicare Other | Admitting: Adult Health

## 2015-07-14 VITALS — BP 134/72 | HR 107 | Temp 97.9°F | Ht 67.0 in | Wt 181.0 lb

## 2015-07-14 DIAGNOSIS — J449 Chronic obstructive pulmonary disease, unspecified: Secondary | ICD-10-CM | POA: Diagnosis not present

## 2015-07-14 DIAGNOSIS — I10 Essential (primary) hypertension: Secondary | ICD-10-CM

## 2015-07-14 MED ORDER — ALBUTEROL SULFATE (2.5 MG/3ML) 0.083% IN NEBU: 2.5000 mg | INHALATION_SOLUTION | Freq: Four times a day (QID) | RESPIRATORY_TRACT | Status: DC | PRN

## 2015-07-14 MED ORDER — PREDNISONE 10 MG PO TABS: ORAL_TABLET | ORAL | Status: DC

## 2015-07-14 MED ORDER — AMLODIPINE BESYLATE 10 MG PO TABS: 10.0000 mg | ORAL_TABLET | Freq: Every day | ORAL | Status: DC

## 2015-07-14 MED ORDER — LEVALBUTEROL HCL 0.63 MG/3ML IN NEBU
0.6300 mg | INHALATION_SOLUTION | Freq: Once | RESPIRATORY_TRACT | Status: AC
  Administered 2015-07-14: 0.63 mg via RESPIRATORY_TRACT

## 2015-07-14 NOTE — Addendum Note (Signed)
Addended by: Karalee Height on: 07/14/2015 11:44 AM   Modules accepted: Orders

## 2015-07-14 NOTE — Progress Notes (Signed)
Subjective:    Patient ID: Greg Jones, male    DOB: 1928/05/09, 82 y.o.   MRN: [id number]  HPI    Review of Systems     Objective:   Physical Exam        Assessment & Plan:     Subjective:    Patient ID: Greg Jones, male    DOB: [birth year], 82 y.o.   MRN: [id number]  HPI 82 year old male former smoker with severe COPD seen for pulmonary consult 05/10/2015 by Dr. Vassie Loll  TEST :  PFTs 10/2014 showed FEV1 of 0.84-39%, FVC of 2.1-67%, without significant broncho-dilator response. DLCO was relatively preserved at 73%. 04/2015 Spirometry today showed FEV1 of 34%-0.81  07/14/2015 Follow up : COPD  Patient returns for a one-month follow-up  for severe COPD. FEV1 is at 34%.[name] She was remains on Symbicort and Spiriva..  Previously on ACE , stopped in Nov 2016 due to cough.  He complains that he continues to have wheezing and cough , gets better on prednisone but then feels wheezing comes back when he stops. He is in pulmonary rehab now, likes it but wheezing a lot.  email  from pulmonary rehab regarding DOE/Wheezing.   No desats with walking. Is getting a walker with seat.  He denies any hemoptysis, orthopnea, PND or leg swelling.   Past Medical History  Diagnosis Date  . COPD (chronic obstructive pulmonary disease) (HCC)   . Hypertension   . Chronic kidney disease   . Respiratory insufficiency   . GERD (gastroesophageal reflux disease)    Current Outpatient Prescriptions on File Prior to Visit  Medication Sig Dispense Refill  . amLODipine (NORVASC) 10 MG tablet Take 1 tablet (10 mg total) by mouth daily. 30 tablet 1  . budesonide-formoterol (SYMBICORT) 160-4.5 MCG/ACT inhaler Inhale 2 puffs into the lungs 2 (two) times daily.    . cholecalciferol (VITAMIN D) 400 UNITS TABS tablet Take 400 Units by mouth 2 (two) times daily.    [name] omeprazole (PRILOSEC) 20 MG capsule Take 20 mg by mouth daily.    [name] tiotropium (SPIRIVA) 18 MCG inhalation capsule Place 18 mcg into  inhaler and inhale daily.    [name] albuterol (PROVENTIL) (2.5 MG/3ML) 0.083% nebulizer solution Take 2.5 mg by nebulization every 6 (six) hours as needed for wheezing or shortness of breath. Reported on 07/14/2015    . prochlorperazine (COMPAZINE) 5 MG tablet Take 5 mg by mouth every 6 (six) hours as needed for nausea or vomiting. Reported on 07/14/2015     No current facility-administered medications on file prior to visit.      Review of Systems Constitutional:   No  weight loss, night sweats,  Fevers, chills,  +fatigue, or  lassitude.  HEENT:   No headaches,  Difficulty swallowing,  Tooth/dental problems, or  Sore throat,                No sneezing, itching, ear ache, nasal congestion, post nasal drip,   CV:  No chest pain,  Orthopnea, PND, swelling in lower extremities, anasarca, dizziness, palpitations, syncope.   GI  No heartburn, indigestion, abdominal pain, nausea, vomiting, diarrhea, change in bowel habits, loss of appetite, bloody stools.   Resp:    No chest wall deformity  Skin: no rash or lesions.  GU: no dysuria, change in color of urine, no urgency or frequency.  No flank pain, no hematuria   MS:  No joint pain or swelling.  No decreased range of motion.  No back pain.  Psych:  No change in mood or affect. No depression or anxiety.  No memory loss.         Objective:   Physical Exam Filed Vitals:   07/14/15 1028  BP: 134/72  Pulse: 107  Temp: 97.9 F (36.6 C)  TempSrc: Oral  Height:  (1.702 m)  Weight: 181 lb (82.101 kg)  SpO2: 96%     GEN: A/Ox3; pleasant , NAD, chronically ill-appearing  HEENT:  [place]/AT,  EACs-clear, TMs-wnl, NOSE-clear, THROAT-clear, no lesions, no postnasal drip or exudate noted.   NECK:  Supple w/ fair ROM; no JVD; normal carotid impulses w/o bruits; no thyromegaly or nodules palpated; no lymphadenopathy.  RESP  Decreased BS in bases .no accessory muscle use, no dullness to percussion  CARD:  RRR, no m/r/g  , no peripheral  edema, pulses intact, no cyanosis or clubbing.  GI:   Soft & nt; nml bowel sounds; no organomegaly or masses detected.  Musco: Warm bil, no deformities or joint swelling noted.   Neuro: alert, no focal deficits noted.    Skin: Warm, no lesions or rashes '      Assessment & Plan:

## 2015-07-14 NOTE — Patient Instructions (Addendum)
Continue on Symbicort and Spiriva daily .  Continue with  Pulmonary rehab. Prednisone  taper to  daily . Hold at this dose until seen back .  Follow up with Primary MD for blood pressure management .  Follow up with Dr. Vassie Loll  In March as planned and  and As needed.

## 2015-07-14 NOTE — Assessment & Plan Note (Signed)
Recurrent COPD flare , will try prednisone taper to low dose at  daily  Advised on side effects of steroids , hope is he will cont to get stronger in rehab On return in 6 weeks try to wean totally off steroids if able.   Plan  Continue on Symbicort and Spiriva daily .  Continue with  Pulmonary rehab. Prednisone  taper to  daily . Hold at this dose until seen back .  Follow up with Primary MD for blood pressure management .  Follow up with Dr. Vassie Loll  In March as planned and  and As needed.

## 2015-07-14 NOTE — Assessment & Plan Note (Signed)
Follow up with PCP for b/p rx

## 2015-07-15 ENCOUNTER — Encounter (HOSPITAL_COMMUNITY)
Admission: RE | Admit: 2015-07-15 | Discharge: 2015-07-15 | Disposition: A | Discharge status: Home / Self Care | Payer: Medicare Other | Source: Ambulatory Visit | Attending: Pulmonary Disease | Admitting: Pulmonary Disease

## 2015-07-15 DIAGNOSIS — J449 Chronic obstructive pulmonary disease, unspecified: Secondary | ICD-10-CM | POA: Diagnosis not present

## 2015-07-15 NOTE — Progress Notes (Signed)
Today, Joss exercised at [facility]. Service time was from 1030 to 1230.  The patient exercised by performing aerobic, strengthening, and stretching exercises. Oxygen saturation, heart rate, blood pressure, rate of perceived exertion, and shortness of breath were all monitored before, during, and after exercise. Prithvi presented with no problems at today's exercise session. Patient attended the Oxygen Safety class today.   The patient did not have an increase in workload intensity during today's exercise session.  Pre-exercise vitals: . Weight kg: 81.7 . Liters of O2: ra . SpO2: 95 . HR: 84 . BP: 140/62 . CBG: na  Exercise vitals: . Highest heartrate:  93 . Lowest oxygen saturation: 97 . Highest blood pressure: 146/76 . Liters of 02: ra  Post-exercise vitals: . SpO2: 96 . HR: 82 . BP: 118/60 . Liters of O2: ra . CBG: na Dr. Alyson Reedy, Medical Director Dr. Malachi Bonds is immediately available during today's Pulmonary Rehab session for Braeson Rupe on 07/15/2015  at 1030 class time.  [name]

## 2015-07-19 NOTE — Progress Notes (Signed)
Reviewed & agree with plan  

## 2015-07-20 ENCOUNTER — Encounter (HOSPITAL_COMMUNITY)
Admission: RE | Admit: 2015-07-20 | Discharge: 2015-07-20 | Disposition: A | Discharge status: Home / Self Care | Payer: Medicare Other | Source: Ambulatory Visit | Attending: Pulmonary Disease | Admitting: Pulmonary Disease

## 2015-07-20 DIAGNOSIS — J449 Chronic obstructive pulmonary disease, unspecified: Secondary | ICD-10-CM | POA: Diagnosis not present

## 2015-07-20 NOTE — Progress Notes (Signed)
Today, [name] exercised at [facility]. Service time was from 1030 to 1200.  The patient exercised by performing aerobic, strengthening, and stretching exercises. Oxygen saturation, heart rate, blood pressure, rate of perceived exertion, and shortness of breath were all monitored before, during, and after exercise. Greg Jones presented with no problems at today's exercise session.  The patient did not have an increase in workload intensity during today's exercise session.  Pre-exercise vitals: . Weight kg: 83.1 . Liters of O2: RA . SpO2: 94 . HR: 92 . BP: 110/70 . CBG: NA  Exercise vitals: . Highest heartrate:  106 . Lowest oxygen saturation: 95 . Highest blood pressure: 124/64 . Liters of 02: RA  Post-exercise vitals: . SpO2: 95 . HR: 86 . BP: 104/70 . Liters of O2: RA . CBG: NA Dr. Alyson Reedy, Medical Director [name] is immediately available during today's Pulmonary Rehab session for Greg Jones on 07/20/2015  at 1030 class time  .

## 2015-07-22 ENCOUNTER — Encounter (HOSPITAL_COMMUNITY)
Admission: RE | Admit: 2015-07-22 | Discharge: 2015-07-22 | Disposition: A | Discharge status: Home / Self Care | Payer: Medicare Other | Source: Ambulatory Visit | Attending: Pulmonary Disease | Admitting: Pulmonary Disease

## 2015-07-22 DIAGNOSIS — J449 Chronic obstructive pulmonary disease, unspecified: Secondary | ICD-10-CM | POA: Diagnosis not present

## 2015-07-22 NOTE — Progress Notes (Signed)
Today, Brittany exercised at [facility]. Service time was from 1030 to 1220.  The patient exercised by performing aerobic, strengthening, and stretching exercises. Oxygen saturation, heart rate, blood pressure, rate of perceived exertion, and shortness of breath were all monitored before, during, and after exercise. Deyon presented with no problems at today's exercise session. Patient attended the Diaphragmatic , purse lip breathing and pulmonary meds class today.   The patient did not have an increase in workload intensity during today's exercise session.  Pre-exercise vitals: . Weight kg: 82.6 . Liters of O2: ra . SpO2: 96 . HR: 76 . BP: 122/60 . CBG: na  Exercise vitals: . Highest heartrate:  111 . Lowest oxygen saturation: 92 . Highest blood pressure: 144/66 . Liters of 02: ra  Post-exercise vitals: . SpO2: 97 . HR: 96 . BP: 126/80 . Liters of O2: ra . CBG: na Dr. Alyson Reedy, Medical Director Dr. Ardyth Harps is immediately available during today's Pulmonary Rehab session for Djuan Talton on 07/22/2015  at 1030 class time.  [name]

## 2015-07-27 ENCOUNTER — Encounter (HOSPITAL_COMMUNITY)
Admission: RE | Admit: 2015-07-27 | Discharge: 2015-07-27 | Disposition: A | Discharge status: Home / Self Care | Payer: Medicare Other | Source: Ambulatory Visit | Attending: Pulmonary Disease | Admitting: Pulmonary Disease

## 2015-07-27 DIAGNOSIS — J449 Chronic obstructive pulmonary disease, unspecified: Secondary | ICD-10-CM | POA: Diagnosis not present

## 2015-07-27 NOTE — Progress Notes (Signed)
Today, Brayen exercised at [facility]. Service time was from 1030 to 1200.  The patient exercised by performing aerobic, strengthening, and stretching exercises. Oxygen saturation, heart rate, blood pressure, rate of perceived exertion, and shortness of breath were all monitored before, during, and after exercise. Nicklous presented with no problems at today's exercise session.  The patient did  have an increase in workload intensity during today's exercise session.  Pre-exercise vitals: . Weight kg: 84.5 . Liters of O2: ra . SpO2: 94 . HR: 85 . BP: 104/60 . CBG: na  Exercise vitals: . Highest heartrate:  98 . Lowest oxygen saturation: 95 . Highest blood pressure: 122/72 . Liters of 02: ra  Post-exercise vitals: . SpO2: 96 . HR: 97 . BP: 148/80 . Liters of O2: ra . CBG: na Dr. Alyson Reedy, Medical Director [name] is immediately available during today's Pulmonary Rehab session for Karver Fadden on 07/27/2015  at 1030 class time  .

## 2015-07-29 ENCOUNTER — Encounter (HOSPITAL_COMMUNITY)
Admission: RE | Admit: 2015-07-29 | Discharge: 2015-07-29 | Disposition: A | Discharge status: Home / Self Care | Payer: Medicare Other | Source: Ambulatory Visit | Attending: Pulmonary Disease | Admitting: Pulmonary Disease

## 2015-07-29 DIAGNOSIS — J449 Chronic obstructive pulmonary disease, unspecified: Secondary | ICD-10-CM | POA: Diagnosis not present

## 2015-07-29 NOTE — Psychosocial Assessment (Signed)
Greg Jones 82 y.o. male  15 day Psychosocial Note  Patient psychosocial assessment reveals no barriers to participation in Pulmonary Rehab. Vashon has made every appointment to pulmonary rehab in the last 30 days. However he does have barriers that prevent him from doing his home exercises as recommended. Psychosocial areas that are currently affecting patient's rehab experience include concerns about his future health and the progression of his disease. He and his sister states that he continues to be inactive at home and often remains lying in the bed most of the day watching TV. I have attempted to speak with the patient about this and he states that he breaths better when he doesn't exert himself and he is tired of coughing with activity. RN educated patient on importance of building stamina and strength by getting out of bed and walking but patient's sister continues to verbalize his lack of motivation. He has received a Rolator to assist with ambulation. The goal is for him to have a place to sit and rest with activity while walking. Patient does continue to exhibit negative coping skills to deal with his psychosocial concerns. His sister states that he fixates himself on TV shows and often doesn't interact with her during the day. She strongly encouraged him to sit on the porch when the weather is pretty but often he declines. As stated previously, he has moved here from [place], and does not have any social support other than his two sisters. Offered emotional support and reassurance. Patient does feel he is beginning to make slight progress toward Pulmonary Rehab goals. He understands that at this point he has to find the motivation within himself to exercise at home. He also understands that is the key to making significant progress in pulmonary rehab. Patient reports his health and activity level has not really improved in the past 30 days as evidenced by patient's report of not significantly  changed ability to ambulate. Patient states family have not noticed changes in his activity or mood. Patient reports not feeling positive about current and projected progression in Pulmonary Rehab. After reviewing the patient's treatment plan, the patient is making progress toward Pulmonary Rehab goals. Patient's rate of progress toward rehab goals is poor unless he begins to move around at home. Plan of action to help patient continue to work towards rehab goals include working closely with MD to determine best treatment for patient and to increase workloads on equipment as tolerated in order to increase his stamina and stregnth. Will continue to monitor and evaluate progress toward psychosocial goal(s).Will continue to monitor and evaluate progress toward psychosocial goal(s).  Goal(s) in progress:  Improved management of stress, depression, anxiety related to his COPD  Improved coping skills  Help patient work toward returning to meaningful activities that improve patient's QOL and are attainable with patient's lung diseasKevonte Jones

## 2015-07-29 NOTE — Progress Notes (Signed)
Today, Greg Jones exercised at [facility] [employer] [facility]. Service time was from 1030 to 1230.  The patient exercised by performing aerobic, strengthening, and stretching exercises. Oxygen saturation, heart rate, blood pressure, rate of perceived exertion, and shortness of breath were all monitored before, during, and after exercise. Greg Jones presented with no problems at today's exercise session. Greg Jones also attended an education session with [facility] on oxygen equiptment. The patient did not have an increase in workload intensity during today's exercise session.  Pre-exercise vitals: . Weight kg: 83.6 . Liters of O2: ra . SpO2: 96 . HR: 88 . BP: 110/60 . CBG: na  Exercise vitals: . Highest heartrate:  121 . Lowest oxygen saturation: 96 . Highest blood pressure: 152/72 . Liters of 02: ra  Post-exercise vitals: . SpO2: 97 . HR: 90 . BP: 120/56 . Liters of O2: ra . CBG: na  Dr. Alyson Reedy, Medical Director Dr. [name] is immediately available during today's Pulmonary Rehab session for [name] on 07/29/2015 at 1030 class time.

## 2015-08-03 ENCOUNTER — Encounter (HOSPITAL_COMMUNITY)
Admission: RE | Admit: 2015-08-03 | Discharge: 2015-08-03 | Disposition: A | Discharge status: Home / Self Care | Payer: Medicare Other | Source: Ambulatory Visit | Attending: Pulmonary Disease | Admitting: Pulmonary Disease

## 2015-08-03 ENCOUNTER — Telehealth: Payer: Self-pay | Admitting: Adult Health

## 2015-08-03 DIAGNOSIS — J449 Chronic obstructive pulmonary disease, unspecified: Secondary | ICD-10-CM | POA: Diagnosis not present

## 2015-08-03 NOTE — Telephone Encounter (Signed)
That is fine , will call pt and have him increase to  daily and set up follow up in office with Dr. Vassie Loll  In March as planned Please contact office for sooner follow up if symptoms do not improve or worsen or seek emergency care

## 2015-08-03 NOTE — Telephone Encounter (Signed)
-----   Message from Oscar La, RN sent at 08/03/2015  4:40 PM EST ----- Regarding: Dr. Molli Knock request Hi Tammy. I am sending this over as a favor to Dr. Molli Knock. Per Medicare requirements he, as our Wellsite geologist, has to see/assess COPD patients q 30 days. Today he saw Mr. Merrow who was extremely wheezy throughout. Mr. Goodpasture stated he had used his nebulizer this am AND his rescue inhaler. I straightened that out so now he knows not to use both together. However, Dr. Molli Knock suggested his prednisone be increased from 5 mg to 10 mg daily. If you don't mind could you please prescribe this for him if you accept this recommendation from [facility]. Thanks Nixa, [employer]

## 2015-08-03 NOTE — Progress Notes (Addendum)
Today, Greg Jones exercised at [facility] [facility]. Service time was from 1030 to 1215.  The patient exercised by performing aerobic, strengthening, and stretching exercises. Oxygen saturation, heart rate, blood pressure, rate of perceived exertion, and shortness of breath were all monitored before, during, and after exercise. Greg Jones presented with no problems at today's exercise session. Greg Jones also had a 30 day face to face with Dr. Alyson Reedy, medical director for pulmonary rehab.  The patient did not have an increase in workload intensity during today's exercise session.  Pre-exercise vitals: . Weight kg: 83.2 . Liters of O2: ra . SpO2: 96 . HR: 91 . BP: 122/62 . CBG: na  Exercise vitals: . Highest heartrate:  105 . Lowest oxygen saturation: 93 . Highest blood pressure: 140/64 . Liters of 02: ra  Post-exercise vitals: . SpO2: 96 . HR: 94 . BP: 122/60 . Liters of O2: ra . CBG: na Dr. Alyson Reedy, Medical Director [name] is immediately available during today's Pulmonary Rehab session for Greg Jones on 08/03/2015  at 1030 class time  .

## 2015-08-03 NOTE — Telephone Encounter (Signed)
Called and spoke with pt's sister. Informed her of TP's recs. She stated that he had enough prednisone taper. Pt voiced and had no further questions. Nothing further needed. Will sign off on message.

## 2015-08-03 NOTE — Telephone Encounter (Signed)
Spoke with pt's wife, states that pt has enough prednisone to double up "for a while", does not need a new rx at this time.  Aware of recs to keep appt in March with RA.  Will call if pt needs a new rx for prednisone.  Nothing further needed at this time.

## 2015-08-05 ENCOUNTER — Encounter (HOSPITAL_COMMUNITY)
Admission: RE | Admit: 2015-08-05 | Discharge: 2015-08-05 | Disposition: A | Discharge status: Home / Self Care | Payer: Medicare Other | Source: Ambulatory Visit | Attending: Pulmonary Disease | Admitting: Pulmonary Disease

## 2015-08-05 DIAGNOSIS — J449 Chronic obstructive pulmonary disease, unspecified: Secondary | ICD-10-CM | POA: Diagnosis not present

## 2015-08-05 NOTE — Progress Notes (Signed)
Today, Greg Jones exercised at [facility] [employer] [facility]. Service time was from 1030 to 1200.  The patient exercised by performing aerobic, strengthening, and stretching exercises. Oxygen saturation, heart rate, blood pressure, rate of perceived exertion, and shortness of breath were all monitored before, during, and after exercise. Greg Jones presented with no problems at today's exercise session. Greg Jones also attended an education on warning signs and symptoms of illnesses.  The patient did not have an increase in workload intensity during today's exercise session.  Pre-exercise vitals: . Weight kg: 84.0 . Liters of O2: ra . SpO2: 94 . HR: 83 . BP: 120/80 . CBG: na  Exercise vitals: . Highest heartrate:  93 . Lowest oxygen saturation: 94 . Highest blood pressure: 148/70 . Liters of 02: ra  Post-exercise vitals: . SpO2: 95 . HR: 77 . BP: 126/64 . Liters of O2: ra . CBG: na  Dr. Alyson Reedy, Medical Director Dr. Benjamine Mola is immediately available during today's Pulmonary Rehab session for Greg Jones on 08/05/2015 at 1030 class time

## 2015-08-10 ENCOUNTER — Telehealth (HOSPITAL_COMMUNITY): Payer: Self-pay | Admitting: Primary Care

## 2015-08-10 ENCOUNTER — Encounter (HOSPITAL_COMMUNITY): Payer: Medicare Other

## 2015-08-12 ENCOUNTER — Encounter (HOSPITAL_COMMUNITY)
Admission: RE | Admit: 2015-08-12 | Discharge: 2015-08-12 | Disposition: A | Discharge status: Home / Self Care | Payer: Medicare Other | Source: Ambulatory Visit | Attending: Pulmonary Disease | Admitting: Pulmonary Disease

## 2015-08-12 DIAGNOSIS — J449 Chronic obstructive pulmonary disease, unspecified: Secondary | ICD-10-CM | POA: Diagnosis not present

## 2015-08-12 NOTE — Progress Notes (Signed)
Today, [name] exercised at [facility] [facility]. Service time was from 1030 to 1215.  The patient exercised by performing aerobic, strengthening, and stretching exercises. Oxygen saturation, heart rate, blood pressure, rate of perceived exertion, and shortness of breath were all monitored before, during, and after exercise. Greg Jones presented with no problems at today's exercise session. He attended exercise for the pulmonary patient class today.  The patient did not have an increase in workload intensity during today's exercise session.  Pre-exercise vitals: . Weight kg: 85.4 . Liters of O2: RA . SpO2: 93 . HR: 90 . BP: 134/60 . CBG: NA  Exercise vitals: . Highest heartrate:  107 . Lowest oxygen saturation: 95 . Highest blood pressure: 126/62 . Liters of 02: RA  Post-exercise vitals: . SpO2: 95 . HR: 91 . BP: 120/56 . Liters of O2: RA . CBG: NA Dr. Alyson Reedy, Medical Director Dr. [name] is immediately available during today's Pulmonary Rehab session for Greg Jones on 08/12/2015  at 1030 class time.  [name]

## 2015-08-17 ENCOUNTER — Encounter (HOSPITAL_COMMUNITY)
Admission: RE | Admit: 2015-08-17 | Discharge: 2015-08-17 | Disposition: A | Discharge status: Home / Self Care | Payer: Medicare Other | Source: Ambulatory Visit | Attending: Pulmonary Disease | Admitting: Pulmonary Disease

## 2015-08-17 DIAGNOSIS — J449 Chronic obstructive pulmonary disease, unspecified: Secondary | ICD-10-CM | POA: Diagnosis not present

## 2015-08-17 NOTE — Progress Notes (Signed)
Daily Session Note  Patient Details  Name: Greg Jones MRN: [id number] Date of Birth: Nov 01, 1927 Referring Provider:  Kerin Perna, NP  Encounter Date: 08/17/2015  Check In:     Session Check In - 08/17/15 1019    Check-In   Location [facility]   Staff Present Trish Fountain, RN, BSN;Ramon Dredge, RN, MHA;Jessica Luan Pulling, MA, ACSM RCEP, Exercise Physiologist;Wanona Stare Leonia Reeves, RN, BSN   Supervising physician immediately available to respond to emergencies [facility] immediately available   Physician(s) Dr. Marily Memos   Medication changes reported     No   Fall or balance concerns reported    No   Warm-up and Cool-down Performed as group-led instruction   Resistance Training Performed Yes   Pain Assessment   Currently in Pain? No/denies      Capillary Blood Glucose: No results found for this or any previous visit (from the past 24 hour(s)).      Exercise Prescription Changes - 08/17/15 1200    Response to Exercise   Blood Pressure (Admit) 126/64 mmHg   Blood Pressure (Exercise) 144/64 mmHg   Blood Pressure (Exit) 138/64 mmHg   Heart Rate (Admit) 92 bpm   Heart Rate (Exercise) 99 bpm   Heart Rate (Exit) 91 bpm   Oxygen Saturation (Admit) 98 %   Oxygen Saturation (Exercise) 95 %   Oxygen Saturation (Exit) 96 %   Rating of Perceived Exertion (Exercise) 15   Perceived Dyspnea (Exercise) 3   Symptoms none   Comments none   Duration Progress to 45 minutes of aerobic exercise without signs/symptoms of physical distress   Intensity Other (comment)  40-80% hhr   Progression   Progression Continue progressive overload as per policy without signs/symptoms or physical distress.   Resistance Training   Training Prescription Yes   Weight BANDS   Reps 10-12   Interval Training   Interval Training No   NuStep   Level 4   Minutes 17   METs 1.7   Arm Ergometer   Level 2.5   Minutes 17   Track   Laps 2   Minutes 17     Goals Met:   Proper associated with RPD/PD & O2 Sat [place] with exercise equipment Using PLB without cueing & demonstrates good technique Exercise tolerated well  Goals Unmet:  Not Applicable  Comments:    Dr. Rush Farmer is Medical Director for Pulmonary Rehab at [facility].

## 2015-08-19 ENCOUNTER — Encounter (HOSPITAL_COMMUNITY)
Admission: RE | Admit: 2015-08-19 | Discharge: 2015-08-19 | Disposition: A | Discharge status: Home / Self Care | Payer: Medicare Other | Source: Ambulatory Visit | Attending: Pulmonary Disease | Admitting: Pulmonary Disease

## 2015-08-19 DIAGNOSIS — J449 Chronic obstructive pulmonary disease, unspecified: Secondary | ICD-10-CM | POA: Insufficient documentation

## 2015-08-19 NOTE — Progress Notes (Signed)
Daily Session Note  Patient Details  Name: Greg Jones MRN: [id number] Date of Birth: [birth year] Referring Provider:  Kerin Perna, NP  Encounter Date: 08/19/2015  Check In:     Session Check In - 08/19/15 1138    Check-In   [facility]   Staff Present Rosebud Poles, RN, Luisa Hart, RN, BSN;Ramon Dredge, RN, MHA;Jessica Luan Pulling, MA, ACSM RCEP, Exercise Physiologist   Supervising physician immediately available to respond to emergencies [facility] immediately available   Physician(s) Dr. Marily Memos   Medication changes reported     No   Fall or balance concerns reported    No   Warm-up and Cool-down Performed as group-led instruction   Resistance Training Performed Yes   VAD Patient? No   Pain Assessment   Currently in Pain? No/denies   Multiple Pain Sites No      Capillary Blood Glucose: No results found for this or any previous visit (from the past 24 hour(s)).      Exercise Prescription Changes - 08/19/15 1300    Exercise Review   Progression No   Response to Exercise   Blood Pressure (Admit) 140/60 mmHg   Blood Pressure (Exercise) 164/74 mmHg   Blood Pressure (Exit) 122/60 mmHg   Heart Rate (Admit) 86 bpm   Heart Rate (Exercise) 95 bpm   Heart Rate (Exit) 77 bpm   Oxygen Saturation (Admit) 93 %   Oxygen Saturation (Exercise) 96 %   Oxygen Saturation (Exit) 95 %   Rating of Perceived Exertion (Exercise) 11   Perceived Dyspnea (Exercise) 3   Symptoms none   Comments none   Duration Progress to 45 minutes of aerobic exercise without signs/symptoms of physical distress   Intensity Other (comment)   Progression   Progression --  40-80% HRR   Resistance Training   Training Prescription Yes   Weight orange bands   Reps 10-12   Interval Training   Interval Training No   NuStep   Level 4   Minutes 15   METs 1.7   Arm Ergometer   Level 3.5   Minutes 15     Goals Met:  Proper associated with RPD/PD & O2  Sat Independence with exercise equipment Queuing for purse lip breathing Strength training completed today  Goals Unmet:  Not Applicable  Comments: Service time is from 1030 to 1230    Dr. Rush Farmer is Medical Director for Pulmonary Rehab at [facility].

## 2015-08-24 ENCOUNTER — Encounter (HOSPITAL_COMMUNITY): Payer: Medicare Other

## 2015-08-26 ENCOUNTER — Encounter (HOSPITAL_COMMUNITY)
Admission: RE | Admit: 2015-08-26 | Discharge: 2015-08-26 | Disposition: A | Discharge status: Home / Self Care | Payer: Medicare Other | Source: Ambulatory Visit | Attending: Pulmonary Disease | Admitting: Pulmonary Disease

## 2015-08-26 VITALS — Wt 187.4 lb

## 2015-08-26 DIAGNOSIS — J449 Chronic obstructive pulmonary disease, unspecified: Secondary | ICD-10-CM

## 2015-08-26 NOTE — Progress Notes (Signed)
Daily Session Note  Patient Details  Name: Greg Jones MRN: [id number] Date of Birth: 12-06-1927 Referring Provider:  Kerin Perna, NP  Encounter Date: 08/26/2015  Check In:     Session Check In - 08/26/15 1045    Check-In   [facility]   Staff Present Rosebud Poles, RN, Luisa Hart, RN, BSN;Ramon Dredge, RN, MHA;Jessica Luan Pulling, MA, ACSM RCEP, Exercise Physiologist   Supervising physician immediately available to respond to emergencies [facility] immediately available   Physician(s)  Dr. Waldron Labs   Medication changes reported     No   Fall or balance concerns reported    No   Warm-up and Cool-down Performed as group-led instruction   Resistance Training Performed Yes   VAD Patient? No   Pain Assessment   Currently in Pain? No/denies   Multiple Pain Sites No      Capillary Blood Glucose: No results found for this or any previous visit (from the past 24 hour(s)).      Exercise Prescription Changes - 08/26/15 1200    Exercise Review   Progression Yes   Response to Exercise   Blood Pressure (Admit) 118/70 mmHg   Blood Pressure (Exercise) 132/60 mmHg   Blood Pressure (Exit) 120/62 mmHg   Heart Rate (Admit) 84 bpm   Heart Rate (Exercise) 100 bpm   Heart Rate (Exit) 89 bpm   Oxygen Saturation (Admit) 94 %   Oxygen Saturation (Exercise) 92 %   Oxygen Saturation (Exit) 91 %   Rating of Perceived Exertion (Exercise) 15   Perceived Dyspnea (Exercise) 2   Duration Progress to 45 minutes of aerobic exercise without signs/symptoms of physical distress   Intensity Other (comment)   Progression   Progression --  40-80% HRR   Resistance Training   Training Prescription Yes   Weight orange bands   Reps 10-12   Interval Training   Interval Training No   NuStep   Level 5   Minutes 15   METs 1.9   Track   Laps 3   Minutes 15     Goals Met:  Proper associated with RPD/PD & O2 Sat Exercise tolerated well Queuing for  purse lip breathing Strength training completed today  Goals Unmet:  Not Applicable  Comments: Service time is from 1030 to 1215    Dr. Rush Farmer is Medical Director for Pulmonary Rehab at [facility].

## 2015-08-31 ENCOUNTER — Encounter (HOSPITAL_COMMUNITY)
Admission: RE | Admit: 2015-08-31 | Discharge: 2015-08-31 | Disposition: A | Discharge status: Home / Self Care | Payer: Medicare Other | Source: Ambulatory Visit | Attending: Pulmonary Disease | Admitting: Pulmonary Disease

## 2015-08-31 DIAGNOSIS — J449 Chronic obstructive pulmonary disease, unspecified: Secondary | ICD-10-CM | POA: Diagnosis not present

## 2015-08-31 NOTE — Progress Notes (Addendum)
Daily Session Note  Patient Details  Name: Greg Jones MRN: [id number] Date of Birth: Nov 06, 1927 Referring Provider:  Kerin Perna, NP  Encounter Date: 08/31/2015  Check In:     Session Check In - 08/31/15 1014    Check-In   [facility]   Staff Present Rosebud Poles, RN, Luisa Hart, RN, BSN;Ramon Dredge, RN, MHA;Jessica Luan Pulling, MA, ACSM RCEP, Exercise Physiologist   Supervising physician immediately available to respond to emergencies [facility] immediately available   Physician(s) Dr. Marily Memos   Medication changes reported     No   Fall or balance concerns reported    No   Warm-up and Cool-down Performed as group-led instruction   Resistance Training Performed Yes   VAD Patient? No   Pain Assessment   Currently in Pain? No/denies      Capillary Blood Glucose: No results found for this or any previous visit (from the past 24 hour(s)).      Exercise Prescription Changes - 08/31/15 1200    Exercise Review   Progression Yes   Response to Exercise   Blood Pressure (Admit) 118/70 mmHg   Blood Pressure (Exercise) 112/70 mmHg   Blood Pressure (Exit) 100/60 mmHg   Heart Rate (Admit) 100 bpm   Heart Rate (Exercise) 111 bpm   Heart Rate (Exit) 98 bpm   Oxygen Saturation (Admit) 93 %   Oxygen Saturation (Exercise) 94 %   Oxygen Saturation (Exit) 96 %   Rating of Perceived Exertion (Exercise) 11   Perceived Dyspnea (Exercise) 1   Symptoms SOB   Duration Progress to 45 minutes of aerobic exercise without signs/symptoms of physical distress   Intensity Other (comment)   Progression   Progression Continue to progress workloads to maintain intensity without signs/symptoms of physical distress.   Resistance Training   Training Prescription Yes   Weight orange bands   Reps 10-12   Interval Training   Interval Training No   NuStep   Level 4   Minutes 15   METs 1.7   Arm Ergometer   Level 4.5   Watts 27   Minutes 15   Track   Laps 2   Minutes 15     Goals Met:  Exercise tolerated well Personal goals reviewed Queuing for purse lip breathing No report of cardiac concerns or symptoms  Goals Unmet:  Shortness of breath with activity  Comments: Service time is from 1030 to 1230   Dr. Rush Farmer is Medical Director for Pulmonary Rehab at [facility]. Patient met with medical director to discuss progression with exercise tolerance and to review discharge plans.

## 2015-09-02 ENCOUNTER — Encounter (HOSPITAL_COMMUNITY): Payer: Medicare Other

## 2015-09-02 NOTE — Psychosocial Assessment (Signed)
Greg RobertsJonathan Jones 82 y.o. male  2390 day Psychosocial Note  Patient psychosocial assessment reveals barriers to participation in Pulmonary Rehab. Greg HaJonathan has missed 2 appointment to pulmonary rehab in the last 30 days. His biggest barrier is his health. He continues to have barriers that prevent him from doing his home exercises as recommended. Psychosocial areas that are currently affecting patient's rehab experience include concerns about his future health and the progression of his disease. He is disappointed that he continues to wheeze with any exertion even though he pulmonologist has increased his daily prednisone dosages. He and his sister states that he continues to be inactive at home and often remains lying in the bed most of the day watching TV. I continue to reinforce home exercise and he states that he breaths better when he doesn't exert himself and he is tired of coughing with activity. RN continues to educated patient on importance of building stamina and strength by getting out of bed and walking but patient's sister continues to verbalize his lack of motivation. Patient does continue to exhibit negative coping skills to deal with his psychosocial concerns. His sister states that he continues to watchTV shows and often doesn't interact with her during the day. She strongly encouraged him to sit on the porch when the weather is pretty but often he declines. As stated previously, he has moved here from [place], and does not have any social support other than his two sisters. Offered emotional support and reassurance. Patient does not feel he is making progress toward Pulmonary Rehab goals. He is scheduled to graduate next week, but afraid his health will quickly deteriorate after discharge. He plans to continue to exercise twice weekly in our self pay maintenance program. He also understands that is the key to making significant progress in rehab. Patient reports his health and activity level has not  really improved in the past 30 days as evidenced by patient's report of not significantly changed ability to ambulate. Patient states family have not noticed changes in his activity or mood. Patient reports not feeling positive about current and projected progression in Pulmonary Rehab. After reviewing the patient's treatment plan, the patient is making progress toward Pulmonary Rehab goals. Patient's rate of progress toward rehab goals is poor unless he begins to move around at home. Plan of action to help patient continue to work towards rehab goals include working closely with MD to determine best treatment for patient and to increase workloads on equipment as tolerated in order to increase his stamina and strength, encourage him to continue to exercise and become physically active post discharge. Will continue to monitor and evaluate progress toward psychosocial goal(s).  Goal(s) in progress:  Improved management of stress, depression, anxiety related to his COPD  Improved coping skills  Help patient work toward returning to meaningful activities that improve patient's QOL and are attainable with patient's lung disease

## 2015-09-07 ENCOUNTER — Telehealth: Payer: Self-pay | Admitting: Pulmonary Disease

## 2015-09-07 ENCOUNTER — Ambulatory Visit (INDEPENDENT_AMBULATORY_CARE_PROVIDER_SITE_OTHER): Payer: Medicare Other | Admitting: Adult Health

## 2015-09-07 ENCOUNTER — Encounter: Payer: Self-pay | Admitting: Adult Health

## 2015-09-07 ENCOUNTER — Encounter (HOSPITAL_COMMUNITY): Admission: RE | Admit: 2015-09-07 | Payer: Medicare Other | Source: Ambulatory Visit

## 2015-09-07 ENCOUNTER — Inpatient Hospital Stay (HOSPITAL_COMMUNITY): Payer: Medicare Other

## 2015-09-07 ENCOUNTER — Inpatient Hospital Stay (HOSPITAL_COMMUNITY)
Admission: AD | Admit: 2015-09-07 | Discharge: 2015-09-14 | DRG: 190 | Disposition: A | Discharge status: Home / Self Care | Payer: Medicare Other | Source: Ambulatory Visit | Attending: Pulmonary Disease | Admitting: Pulmonary Disease

## 2015-09-07 ENCOUNTER — Encounter (HOSPITAL_COMMUNITY): Payer: Self-pay

## 2015-09-07 VITALS — BP 122/72 | HR 94 | Temp 97.8°F | Ht 67.0 in | Wt 188.0 lb

## 2015-09-07 DIAGNOSIS — Z888 Allergy status to other drugs, medicaments and biological substances status: Secondary | ICD-10-CM

## 2015-09-07 DIAGNOSIS — Z880 Allergy status to penicillin: Secondary | ICD-10-CM

## 2015-09-07 DIAGNOSIS — J9621 Acute and chronic respiratory failure with hypoxia: Secondary | ICD-10-CM | POA: Diagnosis not present

## 2015-09-07 DIAGNOSIS — B349 Viral infection, unspecified: Secondary | ICD-10-CM | POA: Diagnosis present

## 2015-09-07 DIAGNOSIS — J810 Acute pulmonary edema: Secondary | ICD-10-CM | POA: Insufficient documentation

## 2015-09-07 DIAGNOSIS — Z9981 Dependence on supplemental oxygen: Secondary | ICD-10-CM | POA: Diagnosis not present

## 2015-09-07 DIAGNOSIS — J111 Influenza due to unidentified influenza virus with other respiratory manifestations: Secondary | ICD-10-CM | POA: Diagnosis not present

## 2015-09-07 DIAGNOSIS — R0600 Dyspnea, unspecified: Secondary | ICD-10-CM | POA: Diagnosis not present

## 2015-09-07 DIAGNOSIS — I130 Hypertensive heart and chronic kidney disease with heart failure and stage 1 through stage 4 chronic kidney disease, or unspecified chronic kidney disease: Secondary | ICD-10-CM | POA: Diagnosis present

## 2015-09-07 DIAGNOSIS — J440 Chronic obstructive pulmonary disease with acute lower respiratory infection: Secondary | ICD-10-CM | POA: Diagnosis present

## 2015-09-07 DIAGNOSIS — J441 Chronic obstructive pulmonary disease with (acute) exacerbation: Secondary | ICD-10-CM | POA: Diagnosis present

## 2015-09-07 DIAGNOSIS — Z7951 Long term (current) use of inhaled steroids: Secondary | ICD-10-CM | POA: Diagnosis not present

## 2015-09-07 DIAGNOSIS — I5033 Acute on chronic diastolic (congestive) heart failure: Secondary | ICD-10-CM | POA: Diagnosis present

## 2015-09-07 DIAGNOSIS — N183 Chronic kidney disease, stage 3 (moderate): Secondary | ICD-10-CM | POA: Diagnosis present

## 2015-09-07 DIAGNOSIS — Z87891 Personal history of nicotine dependence: Secondary | ICD-10-CM | POA: Diagnosis not present

## 2015-09-07 DIAGNOSIS — Z7952 Long term (current) use of systemic steroids: Secondary | ICD-10-CM

## 2015-09-07 DIAGNOSIS — K219 Gastro-esophageal reflux disease without esophagitis: Secondary | ICD-10-CM | POA: Diagnosis present

## 2015-09-07 DIAGNOSIS — R0602 Shortness of breath: Secondary | ICD-10-CM | POA: Diagnosis present

## 2015-09-07 LAB — URINALYSIS, ROUTINE W REFLEX MICROSCOPIC
BILIRUBIN URINE: NEGATIVE
GLUCOSE, UA: NEGATIVE mg/dL
Hgb urine dipstick: NEGATIVE
KETONES UR: NEGATIVE mg/dL
Nitrite: NEGATIVE
PH: 5 (ref 5.0–8.0)
Protein, ur: NEGATIVE mg/dL
SPECIFIC GRAVITY, URINE: 1.015 (ref 1.005–1.030)

## 2015-09-07 LAB — COMPREHENSIVE METABOLIC PANEL
ALK PHOS: 64 U/L (ref 38–126)
ALT: 32 U/L (ref 17–63)
ANION GAP: 13 (ref 5–15)
AST: 30 U/L (ref 15–41)
Albumin: 4.4 g/dL (ref 3.5–5.0)
BILIRUBIN TOTAL: 0.4 mg/dL (ref 0.3–1.2)
BUN: 26 mg/dL — AB (ref 6–20)
CO2: 25 mmol/L (ref 22–32)
CREATININE: 1.97 mg/dL — AB (ref 0.61–1.24)
Calcium: 10.2 mg/dL (ref 8.9–10.3)
Chloride: 104 mmol/L (ref 101–111)
GFR, EST AFRICAN AMERICAN: 34 mL/min — AB (ref >60)
GFR, EST NON AFRICAN AMERICAN: 29 mL/min — AB (ref >60)
GLUCOSE: 144 mg/dL — AB (ref 65–99)
Potassium: 4.8 mmol/L (ref 3.5–5.1)
Sodium: 142 mmol/L (ref 135–145)
Total Protein: 7.8 g/dL (ref 6.5–8.1)

## 2015-09-07 LAB — URINE MICROSCOPIC-ADD ON

## 2015-09-07 LAB — CBC WITH DIFFERENTIAL/PLATELET
Basophils Absolute: 0 10*3/uL (ref 0.0–0.1)
Basophils Relative: 0 %
EOS ABS: 0.1 10*3/uL (ref 0.0–0.7)
EOS PCT: 1 %
HCT: 42.2 % (ref 39.0–52.0)
Hemoglobin: 14 g/dL (ref 13.0–17.0)
LYMPHS ABS: 0.8 10*3/uL (ref 0.7–4.0)
Lymphocytes Relative: 8 %
MCH: 27.4 pg (ref 26.0–34.0)
MCHC: 33.2 g/dL (ref 30.0–36.0)
MCV: 82.6 fL (ref 78.0–100.0)
MONO ABS: 0.3 10*3/uL (ref 0.1–1.0)
MONOS PCT: 2 %
Neutro Abs: 10 10*3/uL — ABNORMAL HIGH (ref 1.7–7.7)
Neutrophils Relative %: 89 %
PLATELETS: 230 10*3/uL (ref 150–400)
RBC: 5.11 MIL/uL (ref 4.22–5.81)
RDW: 16.5 % — ABNORMAL HIGH (ref 11.5–15.5)
WBC: 11.2 10*3/uL — AB (ref 4.0–10.5)

## 2015-09-07 LAB — TROPONIN I: Troponin I: <0.03 ng/mL (ref <0.031)

## 2015-09-07 LAB — BRAIN NATRIURETIC PEPTIDE: B NATRIURETIC PEPTIDE 5: 45.9 pg/mL (ref 0.0–100.0)

## 2015-09-07 MED ORDER — SODIUM CHLORIDE 0.9% FLUSH
3.0000 mL | Freq: Two times a day (BID) | INTRAVENOUS | Status: DC
  Administered 2015-09-08 – 2015-09-13 (×4): 3 mL via INTRAVENOUS

## 2015-09-07 MED ORDER — AMLODIPINE BESYLATE 5 MG PO TABS
5.0000 mg | ORAL_TABLET | Freq: Every day | ORAL | Status: DC
  Administered 2015-09-08 – 2015-09-14 (×7): 5 mg via ORAL
  Filled 2015-09-07 (×7): qty 1

## 2015-09-07 MED ORDER — FUROSEMIDE 10 MG/ML IJ SOLN
40.0000 mg | Freq: Once | INTRAMUSCULAR | Status: AC
  Administered 2015-09-07: 40 mg via INTRAVENOUS
  Filled 2015-09-07: qty 4

## 2015-09-07 MED ORDER — LEVALBUTEROL HCL 0.63 MG/3ML IN NEBU
0.6300 mg | INHALATION_SOLUTION | Freq: Once | RESPIRATORY_TRACT | Status: AC
  Administered 2015-09-07: 0.63 mg via RESPIRATORY_TRACT

## 2015-09-07 MED ORDER — HEPARIN SODIUM (PORCINE) 5000 UNIT/ML IJ SOLN
5000.0000 [IU] | Freq: Three times a day (TID) | INTRAMUSCULAR | Status: DC
  Administered 2015-09-07 – 2015-09-14 (×21): 5000 [IU] via SUBCUTANEOUS
  Filled 2015-09-07 (×22): qty 1

## 2015-09-07 MED ORDER — SODIUM CHLORIDE 0.9% FLUSH
3.0000 mL | Freq: Two times a day (BID) | INTRAVENOUS | Status: DC
Start: 2015-09-07 — End: 2015-09-14
  Administered 2015-09-07 – 2015-09-13 (×10): 3 mL via INTRAVENOUS

## 2015-09-07 MED ORDER — SENNOSIDES-DOCUSATE SODIUM 8.6-50 MG PO TABS: 1.0000 | ORAL_TABLET | Freq: Every evening | ORAL | Status: DC | PRN

## 2015-09-07 MED ORDER — METHYLPREDNISOLONE SODIUM SUCC 125 MG IJ SOLR
60.0000 mg | Freq: Three times a day (TID) | INTRAMUSCULAR | Status: DC
  Administered 2015-09-07 – 2015-09-08 (×3): 60 mg via INTRAVENOUS
  Filled 2015-09-07 (×4): qty 0.96

## 2015-09-07 MED ORDER — ONDANSETRON HCL 4 MG PO TABS: 4.0000 mg | ORAL_TABLET | Freq: Four times a day (QID) | ORAL | Status: DC | PRN

## 2015-09-07 MED ORDER — LEVOFLOXACIN IN D5W 500 MG/100ML IV SOLN
500.0000 mg | INTRAVENOUS | Status: DC
  Administered 2015-09-07: 500 mg via INTRAVENOUS
  Filled 2015-09-07: qty 100

## 2015-09-07 MED ORDER — ACETAMINOPHEN 650 MG RE SUPP: 650.0000 mg | Freq: Four times a day (QID) | RECTAL | Status: DC | PRN

## 2015-09-07 MED ORDER — ACETAMINOPHEN 325 MG PO TABS: 650.0000 mg | ORAL_TABLET | Freq: Four times a day (QID) | ORAL | Status: DC | PRN

## 2015-09-07 MED ORDER — SODIUM CHLORIDE 0.9 % IV SOLN: 250.0000 mL | INTRAVENOUS | Status: DC | PRN

## 2015-09-07 MED ORDER — BUDESONIDE 0.25 MG/2ML IN SUSP
0.2500 mg | Freq: Two times a day (BID) | RESPIRATORY_TRACT | Status: DC
  Administered 2015-09-07 – 2015-09-13 (×12): 0.25 mg via RESPIRATORY_TRACT
  Filled 2015-09-07 (×12): qty 2

## 2015-09-07 MED ORDER — ARFORMOTEROL TARTRATE 15 MCG/2ML IN NEBU
15.0000 ug | INHALATION_SOLUTION | Freq: Two times a day (BID) | RESPIRATORY_TRACT | Status: DC
  Administered 2015-09-07 – 2015-09-13 (×12): 15 ug via RESPIRATORY_TRACT
  Filled 2015-09-07 (×12): qty 2

## 2015-09-07 MED ORDER — ONDANSETRON HCL 4 MG/2ML IJ SOLN
4.0000 mg | Freq: Four times a day (QID) | INTRAMUSCULAR | Status: DC | PRN
  Administered 2015-09-07 – 2015-09-10 (×2): 4 mg via INTRAVENOUS
  Filled 2015-09-07 (×2): qty 2

## 2015-09-07 MED ORDER — IPRATROPIUM-ALBUTEROL 0.5-2.5 (3) MG/3ML IN SOLN
3.0000 mL | Freq: Four times a day (QID) | RESPIRATORY_TRACT | Status: DC
  Administered 2015-09-07 – 2015-09-10 (×10): 3 mL via RESPIRATORY_TRACT
  Filled 2015-09-07 (×12): qty 3

## 2015-09-07 MED ORDER — PANTOPRAZOLE SODIUM 40 MG PO TBEC
40.0000 mg | DELAYED_RELEASE_TABLET | Freq: Every day | ORAL | Status: DC
  Administered 2015-09-08 – 2015-09-13 (×6): 40 mg via ORAL
  Filled 2015-09-07 (×7): qty 1

## 2015-09-07 MED ORDER — LEVALBUTEROL HCL 0.63 MG/3ML IN NEBU: 0.6300 mg | INHALATION_SOLUTION | RESPIRATORY_TRACT | Status: DC | PRN

## 2015-09-07 MED ORDER — SODIUM CHLORIDE 0.9% FLUSH: 3.0000 mL | INTRAVENOUS | Status: DC | PRN

## 2015-09-07 NOTE — Patient Instructions (Signed)
Admit to [facility]  

## 2015-09-07 NOTE — Telephone Encounter (Signed)
Spoke with the pt's sister She states that the pt is having a lot of SOB for the past few days  He is waking up in the night gasping and having to use neb several times per day  OV with TP at 2 pm today

## 2015-09-07 NOTE — H&P (Signed)
Name: Greg RobertsJonathan Jones MRN: [id number] DOB: 09/15/1927    ADMISSION DATE:  (Not on file)  CHIEF COMPLAINT:  Sob   BRIEF PATIENT DESCRIPTION: 82 year old male former smoker with severe COPD admitted from office 09/07/15 for COPD exacerbation +/- D CHF decompensation.   SIGNIFICANT EVENTS  09/07/15 admit from office   STUDIES:  PFTs 10/2014 showed FEV1 of 0.84-39%, FVC of 2.1-67%, without significant broncho-dilator response. DLCO was relatively preserved at 73%. 04/2015 Spirometry today showed FEV1 of 34%-0.81   HISTORY OF PRESENT ILLNESS:   Pt presents for an acute office visit to office.  Complains of prod cough with clear mucus, wheezing, SOB at times, chest congestion/tightness starting on 2 weeks ago. Worse for last 3 days . SOB is much worse. Took 3 neb treatments since yesterday , but not helping. Legs more swollen. Feels like he has fluid in stomach.  Denies any sinus drainage/congestion, fever, nausea or vomiting.  Hhe was remains on Symbicort and Spiriva..  Seen by PCP 2 weeks ago started on HCTZ and pred taper.  Previously on ACE , stopped in Nov 2016 due to cough.  Last cxr 04/2015 with chronic changes BB atx. Vs scarring . [facility] .     PAST MEDICAL HISTORY :   has a past medical history of COPD (chronic obstructive pulmonary disease) (HCC); Hypertension; Chronic kidney disease; Respiratory insufficiency; and GERD (gastroesophageal reflux disease).  has no past surgical history on file.    Prior to Admission medications   Medication Sig Start Date End Date Taking? Authorizing Provider  albuterol (PROVENTIL) (2.5 MG/3ML) 0.083% nebulizer solution Take 3 mLs (2.5 mg total) by nebulization every 6 (six) hours as needed for wheezing or shortness of breath. Reported on 07/14/2015 07/14/15   Llewyn Heap S Lashawne Dura, NP  amLODipine (NORVASC) 10 MG tablet Take 1 tablet (10 mg total) by mouth daily. 07/14/15   Lawonda Pretlow S Valisha Heslin, NP  budesonide-formoterol  (SYMBICORT) 160-4.5 MCG/ACT inhaler Inhale 2 puffs into the lungs 2 (two) times daily.    Historical Provider, MD  cholecalciferol (VITAMIN D) 400 UNITS TABS tablet Take 400 Units by mouth 2 (two) times daily.    Historical Provider, MD  guaiFENesin (ROBITUSSIN) 100 MG/5ML liquid Take 200 mg by mouth 3 (three) times daily as needed for cough.    Historical Provider, MD  hydrochlorothiazide (HYDRODIURIL) 12.5 MG tablet Take 12.5 mg by mouth daily.    Historical Provider, MD  omeprazole (PRILOSEC) 20 MG capsule Take 20 mg by mouth daily.    Historical Provider, MD  Phenylephrine-DM-GG-APAP (MUCINEX FAST-MAX) 10-20-400-650 MG PACK Take by mouth. Take as needed    Historical Provider, MD  predniSONE (DELTASONE) 10 MG tablet 4 tabs for 2 days, then 3 tabs for 2 days, 2 tabs for 2 days, then 1 tab for 2 days, then 1/2 daily 07/14/15   Euclid Cassetta S Kahari Critzer, NP  prochlorperazine (COMPAZINE) 5 MG tablet Take 5 mg by mouth every 6 (six) hours as needed for nausea or vomiting. Reported on 07/14/2015    Historical Provider, MD  tiotropium (SPIRIVA) 18 MCG inhalation capsule Place 18 mcg into inhaler and inhale daily.    Historical Provider, MD   Allergies  Allergen Reactions  . Other     Oxygen Sensitive  . Penicillins Rash  . Vitamin B12 Itching and Rash    injections    FAMILY HISTORY:  family history is not on file. SOCIAL HISTORY:  reports that he quit smoking about 2 years ago.  His smoking use included Cigarettes. He has a 35 pack-year smoking history. He does not have any smokeless tobacco history on file. He reports that he does not drink alcohol or use illicit drugs.  REVIEW OF SYSTEMS:   Constitutional: No weight loss, night sweats, Fevers, chills,  +fatigue, or lassitude.  HEENT: No headaches, Difficulty swallowing, Tooth/dental problems, or Sore throat,   No sneezing, itching, ear ache, nasal congestion, post nasal drip,   CV: No chest pain, Orthopnea, PND, +swelling  in lower extremities,  No anasarca, dizziness, palpitations, syncope.   GI No heartburn, indigestion, abdominal pain, nausea, vomiting, diarrhea, change in bowel habits, loss of appetite, bloody stools.   Resp: No chest wall deformity  Skin: no rash or lesions.  GU: no dysuria, change in color of urine, no urgency or frequency. No flank pain, no hematuria   MS: No joint pain or swelling. No decreased range of motion. No back pain.  Psych: No change in mood or affect. No depression or anxiety. No memory loss.    SUBJECTIVE:  Breathing is getting worse    VITAL SIGNS: Temp:  [97.8 F (36.6 C)] 97.8 F (36.6 C) (03/21 1406) Pulse Rate:  [94] 94 (03/21 1406) BP: (122)/(72) 122/72 mmHg (03/21 1406) SpO2:  [98 %] 98 % (03/21 1406) Weight:  [188 lb (85.276 kg)] 188 lb (85.276 kg) (03/21 1406)  PHYSICAL EXAMINATION:  GEN: A/Ox3; pleasant , NAD, chronically ill-appearing  HEENT: [place]/AT, EACs-clear, TMs-wnl, NOSE-clear, THROAT-clear, no lesions, no postnasal drip or exudate noted.   NECK: Supple w/ fair ROM; no JVD; normal carotid impulses w/o bruits; no thyromegaly or nodules palpated; no lymphadenopathy.   RESP Inspiratory /Exp wheezing , increased wob with +accessory muscle use, no dullness to percussion  CARD: RRR, no m/r/g , 1+ peripheral edema, pulses intact, no cyanosis or clubbing.  GI: Soft & nt; nml bowel sounds; no organomegaly or masses detected.   Musco: Warm bil, no deformities or joint swelling noted.   Neuro: alert, no focal deficits noted.   Skin: Warm, no lesions or rashes  No results for input(s): NA, K, CL, CO2, BUN, CREATININE, GLUCOSE in the last 168 hours. No results for input(s): HGB, HCT, WBC, PLT in the last 168 hours. No results found.  ASSESSMENT / PLAN: 1. COPD exacerbation -refractory to OP treatment  Plan  Admit to [facility] Tele  Check cxr , labs with BNP (if elevated consider echo)  Begin IV abx with Levaquin  Begin IV  solumedrol  q 8 .  Hold Symbicort and Spiriva from home  Begin Brovana /Pulmicort /Duoneb Neb .  Xopenex neb. As needed   Gentle diuresis as b/p and scr allow. W/ Lasix  x 1 .  I/O q shift.  Check troponin x 1 .     2. HTN   Plan  Cont home meds   3. GERD   Plan  PPI    Dale Ribeiro NP-C  Pulmonary and Critical Care Medicine [facility] Pager: [phone]  09/07/2015, 3:11 PM

## 2015-09-07 NOTE — Addendum Note (Signed)
Addended by: Karalee HeightOX, Alessander Sikorski P on: 09/07/2015 02:29 PM   Modules accepted: Orders

## 2015-09-07 NOTE — Progress Notes (Signed)
Subjective:    Patient ID: Greg Jones, male    DOB: 1927/07/14, 82 y.o.   MRN: [id number]  HPI 82 year old male former smoker with severe COPD seen for pulmonary consult 05/10/2015 by Dr. Vassie Loll  TEST :  PFTs 10/2014 showed FEV1 of 0.84-39%, FVC of 2.1-67%, without significant broncho-dilator response. DLCO was relatively preserved at 73%. 04/2015 Spirometry today showed FEV1 of 34%-0.81  09/07/2015  Acute OV : COPD  Pt presents for an acute office visit.  Complains of prod cough with clear mucus, wheezing, SOB at times, chest congestion/tightness starting on 2 weeks ago. Worse for last 3 days . SOB is much worse. Took 3 neb treatments since yesterday , but not helping. Legs more swollen. Feels like he has fluid in stomach.   Denies any sinus drainage/congestion, fever, nausea or vomiting.  Hhe was remains on Symbicort and Spiriva..  Seen by PCP 2 weeks ago started on HCTZ and pred taper.  Previously on ACE , stopped in Nov 2016 due to cough.  Last cxr 04/2015 with chronic changes BB atx. Vs scarring      Past Medical History  Diagnosis Date  . COPD (chronic obstructive pulmonary disease) (HCC)   . Hypertension   . Chronic kidney disease   . Respiratory insufficiency   . GERD (gastroesophageal reflux disease)    Current Outpatient Prescriptions on File Prior to Visit  Medication Sig Dispense Refill  . albuterol (PROVENTIL) (2.5 MG/3ML) 0.083% nebulizer solution Take 3 mLs (2.5 mg total) by nebulization every 6 (six) hours as needed for wheezing or shortness of breath. Reported on 07/14/2015 75 mL 5  . amLODipine (NORVASC) 10 MG tablet Take 1 tablet (10 mg total) by mouth daily. 30 tablet 1  . budesonide-formoterol (SYMBICORT) 160-4.5 MCG/ACT inhaler Inhale 2 puffs into the lungs 2 (two) times daily.    . cholecalciferol (VITAMIN D) 400 UNITS TABS tablet Take 400 Units by mouth 2 (two) times daily.    [name] omeprazole (PRILOSEC) 20 MG capsule Take 20 mg by mouth daily.    .  predniSONE (DELTASONE) 10 MG tablet 4 tabs for 2 days, then 3 tabs for 2 days, 2 tabs for 2 days, then 1 tab for 2 days, then 1/2 daily 60 tablet 2  . prochlorperazine (COMPAZINE) 5 MG tablet Take 5 mg by mouth every 6 (six) hours as needed for nausea or vomiting. Reported on 07/14/2015    . tiotropium (SPIRIVA) 18 MCG inhalation capsule Place 18 mcg into inhaler and inhale daily.     No current facility-administered medications on file prior to visit.     Review of Systems Constitutional:   No  weight loss, night sweats,  Fevers, chills,  +fatigue, or  lassitude.  HEENT:   No headaches,  Difficulty swallowing,  Tooth/dental problems, or  Sore throat,                No sneezing, itching, ear ache, nasal congestion, post nasal drip,   CV:  No chest pain,  Orthopnea, PND, swelling in lower extremities, anasarca, dizziness, palpitations, syncope.   GI  No heartburn, indigestion, abdominal pain, nausea, vomiting, diarrhea, change in bowel habits, loss of appetite, bloody stools.   Resp:    No chest wall deformity  Skin: no rash or lesions.  GU: no dysuria, change in color of urine, no urgency or frequency.  No flank pain, no hematuria   MS:  No joint pain or swelling.  No decreased range of motion.  No back pain.  Psych:  No change in mood or affect. No depression or anxiety.  No memory loss.         Objective:   Physical Exam  Filed Vitals:   09/07/15 1406  BP: 122/72  Pulse: 94  Temp: 97.8 F (36.6 C)  TempSrc: Oral  Height: 5\' 7"  (1.702 m)  Weight: 188 lb (85.276 kg)  SpO2: 98%    GEN: A/Ox3; pleasant , NAD, chronically ill-appearing  HEENT:  [place]/AT,  EACs-clear, TMs-wnl, NOSE-clear, THROAT-clear, no lesions, no postnasal drip or exudate noted.   NECK:  Supple w/ fair ROM; no JVD; normal carotid impulses w/o bruits; no thyromegaly or nodules palpated; no lymphadenopathy.  RESP  Inspiratory /Exp wheezing , increased wob with +accessory muscle use, no dullness to  percussion  CARD: RRR, no m/r/g  , 1+ peripheral edema, pulses intact, no cyanosis or clubbing.  GI:   Soft & nt; nml bowel sounds; no organomegaly or masses detected.  Musco: Warm bil, no deformities or joint swelling noted.   Neuro: alert, no focal deficits noted.    Skin: Warm, no lesions or rashes ' Tammy Parrett NP-C  [facility]  09/07/15      Assessment & Plan:

## 2015-09-07 NOTE — Assessment & Plan Note (Signed)
Exacerbation refractory to OP treatment .  [facility]

## 2015-09-08 DIAGNOSIS — J810 Acute pulmonary edema: Secondary | ICD-10-CM

## 2015-09-08 LAB — BASIC METABOLIC PANEL
Anion gap: 13 (ref 5–15)
BUN: 32 mg/dL — AB (ref 6–20)
CHLORIDE: 101 mmol/L (ref 101–111)
CO2: 23 mmol/L (ref 22–32)
Calcium: 9.6 mg/dL (ref 8.9–10.3)
Creatinine, Ser: 2.43 mg/dL — ABNORMAL HIGH (ref 0.61–1.24)
GFR, EST AFRICAN AMERICAN: 26 mL/min — AB (ref >60)
GFR, EST NON AFRICAN AMERICAN: 23 mL/min — AB (ref >60)
Glucose, Bld: 165 mg/dL — ABNORMAL HIGH (ref 65–99)
POTASSIUM: 4.4 mmol/L (ref 3.5–5.1)
SODIUM: 137 mmol/L (ref 135–145)

## 2015-09-08 LAB — CBC
HEMATOCRIT: 42.2 % (ref 39.0–52.0)
Hemoglobin: 13.4 g/dL (ref 13.0–17.0)
MCH: 26.9 pg (ref 26.0–34.0)
MCHC: 31.8 g/dL (ref 30.0–36.0)
MCV: 84.6 fL (ref 78.0–100.0)
PLATELETS: 243 10*3/uL (ref 150–400)
RBC: 4.99 MIL/uL (ref 4.22–5.81)
RDW: 16.7 % — AB (ref 11.5–15.5)
WBC: 9.4 10*3/uL (ref 4.0–10.5)

## 2015-09-08 LAB — GLUCOSE, CAPILLARY: Glucose-Capillary: 147 mg/dL — ABNORMAL HIGH (ref 65–99)

## 2015-09-08 MED ORDER — METHYLPREDNISOLONE SODIUM SUCC 40 MG IJ SOLR
40.0000 mg | Freq: Four times a day (QID) | INTRAMUSCULAR | Status: DC
  Administered 2015-09-08 – 2015-09-11 (×12): 40 mg via INTRAVENOUS
  Filled 2015-09-08 (×15): qty 1

## 2015-09-08 MED ORDER — LEVOFLOXACIN IN D5W 250 MG/50ML IV SOLN
250.0000 mg | INTRAVENOUS | Status: AC
Start: 2015-09-08 — End: 2015-09-12
  Administered 2015-09-08 – 2015-09-12 (×5): 250 mg via INTRAVENOUS
  Filled 2015-09-08 (×5): qty 50

## 2015-09-08 NOTE — H&P (Signed)
Name: Cherlyn RobertsJonathan Rommel MRN: [id number] DOB: 06/08/1928    ADMISSION DATE:  09/07/2015  CHIEF COMPLAINT:  Sob   BRIEF PATIENT DESCRIPTION: 82 year old male former smoker with severe COPD admitted from office 09/07/15 for COPD exacerbation +/- D CHF decompensation.   SIGNIFICANT EVENTS  09/07/15 admit from office   STUDIES:  PFTs 10/2014 showed FEV1 of 0.84-39%, FVC of 2.1-67%, without significant broncho-dilator response. DLCO was relatively preserved at 73%. 04/2015 Spirometry today showed FEV1 of 34%-0.81   REVIEW OF SYSTEMS:   Less SOB, wheezing, cough. Rest of ROS is (-).    SUBJECTIVE:  Significantly improved. Had a good night. Gets SOB with exertion (? Baseline)   VITAL SIGNS: Temp:  [97.8 F (36.6 C)-98.3 F (36.8 C)] 97.9 F (36.6 C) (03/22 0511) Pulse Rate:  [89-100] 89 (03/22 0511) Resp:  [18-24] 18 (03/22 0511) BP: (122-173)/(72-86) 141/72 mmHg (03/22 0928) SpO2:  [95 %-99 %] 97 % (03/22 0740) Weight:  [182 lb 12.2 oz (82.9 kg)-188 lb (85.276 kg)] 182 lb 12.2 oz (82.9 kg) (03/22 0511)  PHYSICAL EXAMINATION:  GEN: A/Ox3; pleasant , NAD, chronically ill-appearing  HEENT: [place]/AT, EACs-clear, TMs-wnl, NOSE-clear, THROAT-clear, no lesions, no postnasal drip or exudate noted.   NECK: Supple w/ fair ROM; no JVD; normal carotid impulses w/o bruits; no thyromegaly or nodules palpated; no lymphadenopathy.   RESP (-) accessory muscle use.  Occasional wheezing at bases. (-) crackles/rhonchi.   CARD: RRR, no m/r/g , 1+ peripheral edema, pulses intact, no cyanosis or clubbing.  GI: Soft & nt; nml bowel sounds; no organomegaly or masses detected.   Musco: Warm bil, no deformities or joint swelling noted.   Neuro: alert, no focal deficits noted.   Skin: Warm, no lesions or rashes   Recent Labs Lab 09/07/15 1713 09/08/15 0500  NA 142 137  K 4.8 4.4  CL 104 101  CO2 25 23  BUN 26* 32*  CREATININE 1.97* 2.43*  GLUCOSE 144* 165*    Recent Labs Lab  09/07/15 1713 09/08/15 0500  HGB 14.0 13.4  HCT 42.2 42.2  WBC 11.2* 9.4  PLT 230 243   X-ray Chest Pa And Lateral  09/07/2015  CLINICAL DATA:  Shortness of breath, especially with exertion and productive cough. EXAM: CHEST  2 VIEW COMPARISON:  Chest x-ray dated 05/10/2015. FINDINGS: Lungs again appear hyperexpanded suggesting COPD. Suspect associated chronic bronchitic changes centrally. Lungs otherwise clear. No evidence of pneumonia. No pleural effusion or pneumothorax seen. Osseous and soft tissue structures about the chest are unremarkable. IMPRESSION: 1. Hyperexpanded lungs suggesting COPD. Suspect associated chronic bronchitic changes centrally. 2. No acute findings.  No evidence of pneumonia. Electronically Signed   By: Bary RichardStan  Maynard M.D.   On: 09/07/2015 16:45    ASSESSMENT / PLAN: 1. Severe Acute COPD exacerbation -refractory to OP treatment -- Clinically improved.  Plan  Cont pulmicort, brovana, duoneb. Dec medrol to 40 q 8 Cont O2. On levaquin.   2. Mild Volume Overload / Pulmonary edema. Clinically better on lasix. (-) 1L over 24 hrs. Will observe.  Hold off on lasix today.  3. HTN. BP better controlled. Cont BP meds.   4. DVT prophylaxis.   5. Plan d/w pt. Anticipate d/c in 2-3 days as long as he continues to improve. No obvious infxn. Possible aecopd 2 to viral infxn.   Pollie MeyerJ. Angelo A de Dios, MD 09/08/2015, 9:56 AM [place] [facility] Pager [phone] After 3 pm or if no answer, call [phone]

## 2015-09-08 NOTE — Progress Notes (Signed)
No new changes from previous nurse assessment. Will continue to monitor.

## 2015-09-08 NOTE — Care Management Note (Signed)
Case Management Note  Patient Details  Name: Greg RobertsJonathan Jones MRN: [id number] Date of Birth: 09/24/1927  Subjective/Objective: 82 y/o m admitted w/COPD. From home.                   Action/Plan:d/c plan home.   Expected Discharge Date:   (UNKNOWN)               Expected Discharge Plan:  Home/Self Care  In-House Referral:     Discharge planning Services  CM Consult  Post Acute Care Choice:    Choice offered to:     DME Arranged:    DME Agency:     HH Arranged:    HH Agency:     Status of Service:  In process, will continue to follow  Medicare Important Message Given:    Date Medicare IM Given:    Medicare IM give by:    Date Additional Medicare IM Given:    Additional Medicare Important Message give by:     If discussed at Long Length of Stay Meetings, dates discussed:    Additional Comments:  Greg Jones, Greg Culliton, RN 09/08/2015, 12:04 PM

## 2015-09-08 NOTE — Care Management Note (Signed)
Case Management Note  Patient Details  Name: Greg RobertsJonathan Jones MRN: [id number] Date of Birth: 07/18/1927  Subjective/Objective:  Per Nsg no PT cons needed.                  Action/Plan:   Expected Discharge Date:   (UNKNOWN)               Expected Discharge Plan:  Home/Self Care  In-House Referral:     Discharge planning Services  CM Consult  Post Acute Care Choice:    Choice offered to:     DME Arranged:    DME Agency:     HH Arranged:    HH Agency:     Status of Service:  In process, will continue to follow  Medicare Important Message Given:    Date Medicare IM Given:    Medicare IM give by:    Date Additional Medicare IM Given:    Additional Medicare Important Message give by:     If discussed at Long Length of Stay Meetings, dates discussed:    Additional Comments:  Lanier ClamMahabir, Vyron Fronczak, RN 09/08/2015, 12:04 PM

## 2015-09-09 ENCOUNTER — Encounter (HOSPITAL_COMMUNITY): Payer: Self-pay

## 2015-09-09 ENCOUNTER — Encounter (HOSPITAL_COMMUNITY): Payer: Medicare Other

## 2015-09-09 DIAGNOSIS — R0600 Dyspnea, unspecified: Secondary | ICD-10-CM

## 2015-09-09 LAB — GLUCOSE, CAPILLARY: GLUCOSE-CAPILLARY: 157 mg/dL — AB (ref 65–99)

## 2015-09-09 NOTE — Clinical Documentation Improvement (Signed)
Critical Care  Can the diagnosis of CHF be further specified?    Acuity - Acute, Chronic, Acute on Chronic   Type - Systolic, Diastolic, Systolic and Diastolic  Other  Clinically Undetermined   Document any associated diagnoses/conditions   Supporting Information: Mild Volume Overload / Pulmonary edema. Clinically better on lasix. (-) 1L over 24 hrs. Hold off on lasix today per 3/22 progress notes. + Bipedal edema, will diurese gently with low dose lasix per 3/21 progress notes. + / - CHF decompensation per 3/21 progress notes.   Please exercise your independent, professional judgment when responding. A specific answer is not anticipated or expected.   Thank Sabino DonovanYou,  [facility] [place] [phone]

## 2015-09-09 NOTE — Progress Notes (Signed)
Pulmonary Rehab Discharge Note  Greg Jones has been discharged from pulmonary rehab related to a current inpatient hospitalization for COPD exacerbation and Diastolic HF. Spoke with patients sister this am and it was agreed that patient would not be ready to return to rehab for several weeks. He has completed his exercise sessions and was to complete his final 6 min walk test on his initial day of absence because of his illness. Based on conversation with his sister, a final six min walk test may not show the potential benefits Greg Jones received in rehab. Encouraged his sister to contact us once Greg Jones was ready to return.

## 2015-09-09 NOTE — Clinical Documentation Improvement (Signed)
Critical Care  Can the diagnosis of CKD be further specified?   CKD Stage I - GFR greater than or equal to 90  CKD Stage II - GFR 60-89  CKD Stage III - GFR 30-59  CKD Stage IV - GFR 15-29  CKD Stage V - GFR < 15  ESRD (End Stage Renal Disease)  Other condition  Unable to clinically determine   Supporting Information: : (risk factors, signs and symptoms, diagnostics, treatment) CKD noted per 3/21 progress notes. Labs:   Bun   Creat   GFR: 3/21:     26     1.97      34 3/22:     32     2.43      26  Please exercise your independent, professional judgment when responding. A specific answer is not anticipated or expected.   Thank Sabino DonovanYou, Felicia Bloomquist [facility] [place] [phone]

## 2015-09-09 NOTE — Progress Notes (Signed)
Name: Greg RobertsJonathan Jones MRN: [id number] DOB: 03/14/1928    ADMISSION DATE:  09/07/2015  CHIEF COMPLAINT:  Sob   BRIEF PATIENT DESCRIPTION: 82 year old male former smoker with severe COPD admitted from office 09/07/15 for COPD exacerbation +/- D CHF decompensation.   SIGNIFICANT EVENTS  09/07/15 admit from office   STUDIES:  PFTs 10/2014 showed FEV1 of 0.84-39%, FVC of 2.1-67%, without significant broncho-dilator response. DLCO was relatively preserved at 73%. 04/2015 Spirometry today showed FEV1 of 34%-0.81   REVIEW OF SYSTEMS:   More SOB this am. Was better on 3/21. On and off cough. (-) cp. Rest of ROS (-).    SUBJECTIVE:  More SOB but not as bad as admit day. On and off cough.  Comfortably laying in bed.    VITAL SIGNS: Temp:  [97.7 F (36.5 C)-98 F (36.7 C)] 97.8 F (36.6 C) (03/23 0525) Pulse Rate:  [81-96] 81 (03/23 0525) Resp:  [18-20] 18 (03/23 0525) BP: (132-171)/(66-79) 132/68 mmHg (03/23 0525) SpO2:  [95 %-99 %] 96 % (03/23 0900) Weight:  [181 lb 10.5 oz (82.4 kg)] 181 lb 10.5 oz (82.4 kg) (03/23 0525)  PHYSICAL EXAMINATION:  GEN: A/Ox3; pleasant , NAD, chronically ill-appearing  HEENT: [place]/AT, EACs-clear, TMs-wnl, NOSE-clear, THROAT-clear, no lesions, no postnasal drip or exudate noted.   NECK: Supple w/ fair ROM; no JVD; normal carotid impulses w/o bruits; no thyromegaly or nodules palpated; no lymphadenopathy.   RESP (-) accessory muscle use.  More wheeze on L upper and mid lung zones. (-) crackles/rhonchi.   CARD: RRR, no m/r/g , 1+ peripheral edema, pulses intact, no cyanosis or clubbing.  GI: Soft & nt; nml bowel sounds; no organomegaly or masses detected.   Musco: Warm bil, no deformities or joint swelling noted.   Neuro: alert, no focal deficits noted.   Skin: Warm, no lesions or rashes   Recent Labs Lab 09/07/15 1713 09/08/15 0500  NA 142 137  K 4.8 4.4  CL 104 101  CO2 25 23  BUN 26* 32*  CREATININE 1.97* 2.43*  GLUCOSE 144*  165*    Recent Labs Lab 09/07/15 1713 09/08/15 0500  HGB 14.0 13.4  HCT 42.2 42.2  WBC 11.2* 9.4  PLT 230 243   X-ray Chest Pa And Lateral  09/07/2015  CLINICAL DATA:  Shortness of breath, especially with exertion and productive cough. EXAM: CHEST  2 VIEW COMPARISON:  Chest x-ray dated 05/10/2015. FINDINGS: Lungs again appear hyperexpanded suggesting COPD. Suspect associated chronic bronchitic changes centrally. Lungs otherwise clear. No evidence of pneumonia. No pleural effusion or pneumothorax seen. Osseous and soft tissue structures about the chest are unremarkable. IMPRESSION: 1. Hyperexpanded lungs suggesting COPD. Suspect associated chronic bronchitic changes centrally. 2. No acute findings.  No evidence of pneumonia. Electronically Signed   By: Bary RichardStan  Maynard M.D.   On: 09/07/2015 16:45    ASSESSMENT / PLAN: 1. Severe Acute COPD exacerbation -refractory to OP treatment -- Clinically improved yesterday but more wheezing today.  Plan  Cont pulmicort, brovana, duoneb. Cont medrol to 40 q8.  Cont O2. Try to wean off. Keep O2 sats > 90%. Not on o2 at home.  On levaquin. (D3) -- for bronchitis. Plan for 5days.   2. Mild Volume Overload / Pulmonary edema. Clinically better on lasix. Still on the (-) side.  Will observe.  Hold off on lasix today.  3. HTN. BP better controlled this am.  Cont BP meds. If BP gets elevated tonight, try to increase norvasc dose to 10 mg/d.  4. CKD st III-IV.  Creat at baseline. Making urine. Check creat in am.   5. Plan d/w pt. Anticipate d/c in 2-3 days as long as he continues to improve. No obvious infxn. Possible aecopd 2 to viral infxn.   6. DVT prophylaxis   J. Alexis Frock, MD 09/09/2015, 11:14 AM [place] [facility] Pager [phone] After 3 pm or if no answer, call [phone]

## 2015-09-10 ENCOUNTER — Inpatient Hospital Stay (HOSPITAL_COMMUNITY): Payer: Medicare Other

## 2015-09-10 LAB — BASIC METABOLIC PANEL
Anion gap: 10 (ref 5–15)
BUN: 40 mg/dL — AB (ref 6–20)
CHLORIDE: 102 mmol/L (ref 101–111)
CO2: 26 mmol/L (ref 22–32)
CREATININE: 1.96 mg/dL — AB (ref 0.61–1.24)
Calcium: 9.4 mg/dL (ref 8.9–10.3)
GFR calc Af Amer: 34 mL/min — ABNORMAL LOW (ref >60)
GFR calc non Af Amer: 29 mL/min — ABNORMAL LOW (ref >60)
Glucose, Bld: 167 mg/dL — ABNORMAL HIGH (ref 65–99)
POTASSIUM: 4.5 mmol/L (ref 3.5–5.1)
Sodium: 138 mmol/L (ref 135–145)

## 2015-09-10 LAB — CBC
HEMATOCRIT: 41.7 % (ref 39.0–52.0)
Hemoglobin: 13.6 g/dL (ref 13.0–17.0)
MCH: 26.9 pg (ref 26.0–34.0)
MCHC: 32.6 g/dL (ref 30.0–36.0)
MCV: 82.6 fL (ref 78.0–100.0)
Platelets: 227 10*3/uL (ref 150–400)
RBC: 5.05 MIL/uL (ref 4.22–5.81)
RDW: 16.8 % — AB (ref 11.5–15.5)
WBC: 16.3 10*3/uL — ABNORMAL HIGH (ref 4.0–10.5)

## 2015-09-10 LAB — GLUCOSE, CAPILLARY: Glucose-Capillary: 131 mg/dL — ABNORMAL HIGH (ref 65–99)

## 2015-09-10 MED ORDER — IPRATROPIUM-ALBUTEROL 0.5-2.5 (3) MG/3ML IN SOLN
3.0000 mL | Freq: Four times a day (QID) | RESPIRATORY_TRACT | Status: DC
  Administered 2015-09-11 – 2015-09-13 (×8): 3 mL via RESPIRATORY_TRACT
  Filled 2015-09-10 (×9): qty 3

## 2015-09-10 NOTE — Care Management Important Message (Signed)
Important Message  Patient Details  Name: Greg Jones MRN: [id number] Date of Birth: 01/27/1928   Medicare Important Message Given:  Yes    Haskell FlirtJamison, Chynah Orihuela 09/10/2015, 11:41 AMImportant Message  Patient Details  Name: Greg RobertsJonathan Jones MRN: [id number] Date of Birth: 01/14/1928   Medicare Important Message Given:  Yes    Haskell FlirtJamison, Aslan Montagna 09/10/2015, 11:41 AM

## 2015-09-10 NOTE — Progress Notes (Signed)
Name: Greg RobertsJonathan Jones MRN: [id number] DOB: 08/29/1927    ADMISSION DATE:  09/07/2015  CHIEF COMPLAINT:  Sob   BRIEF PATIENT DESCRIPTION: 82 year old male former smoker with severe COPD admitted from office 09/07/15 for COPD exacerbation +/- D CHF decompensation.   SIGNIFICANT EVENTS  09/07/15 admit from office   STUDIES:  PFTs 10/2014 showed FEV1 of 0.84-39%, FVC of 2.1-67%, without significant broncho-dilator response. DLCO was relatively preserved at 73%. 04/2015 Spirometry today showed FEV1 of 34%-0.81    SUBJECTIVE:  Pt remains SOB with exertion (not baseline); gets SOB walking to BR. Got rescue alb neb x 2.  40-50% better than baseline.     VITAL SIGNS: Temp:  [97.6 F (36.4 C)-97.8 F (36.6 C)] 97.8 F (36.6 C) (03/24 0943) Pulse Rate:  [73-95] 95 (03/24 0943) Resp:  [16-18] 18 (03/24 0943) BP: (134-163)/(59-72) 163/72 mmHg (03/24 0943) SpO2:  [95 %-99 %] 97 % (03/24 0943) Weight:  [183 lb 6.8 oz (83.2 kg)] 183 lb 6.8 oz (83.2 kg) (03/24 0444)  PHYSICAL EXAMINATION:  GEN: A/Ox3; pleasant , NAD, chronically ill-appearing  HEENT: [place]/AT, EACs-clear, TMs-wnl, NOSE-clear, THROAT-clear, no lesions, no postnasal drip or exudate noted.   NECK: Supple w/ fair ROM; no JVD; normal carotid impulses w/o bruits; no thyromegaly or nodules palpated; no lymphadenopathy.   RESP (-) accessory muscle use.  Bilateral wheezing.  (-) crackles/rhonchi.   CARD: RRR, no m/r/g , 1+ peripheral edema, pulses intact, no cyanosis or clubbing.  GI: Soft & nt; nml bowel sounds; no organomegaly or masses detected.   Musco: Warm bil, no deformities or joint swelling noted.   Neuro: alert, no focal deficits noted.   Skin: Warm, no lesions or rashes   Recent Labs Lab 09/07/15 1713 09/08/15 0500 09/10/15 0509  NA 142 137 138  K 4.8 4.4 4.5  CL 104 101 102  CO2 25 23 26   BUN 26* 32* 40*  CREATININE 1.97* 2.43* 1.96*  GLUCOSE 144* 165* 167*    Recent Labs Lab 09/07/15 1713  09/08/15 0500 09/10/15 0509  HGB 14.0 13.4 13.6  HCT 42.2 42.2 41.7  WBC 11.2* 9.4 16.3*  PLT 230 243 227   Dg Chest Port 1 View  09/10/2015  CLINICAL DATA:  Shortness of breath . EXAM: PORTABLE CHEST 1 VIEW COMPARISON:  03/21/ 2017. FINDINGS: Mediastinum and hilar structures normal. Mild bibasilar subsegmental atelectasis. Heart size normal. No pleural effusion or pneumothorax. IMPRESSION: Mild bibasilar subsegmental atelectasis. Similar findings noted on prior exam. Electronically Signed   By: Maisie Fushomas  Register   On: 09/10/2015 07:12    ASSESSMENT / PLAN: 1. Severe Acute COPD exacerbation -refractory to OP treatment -- slow to improve. Pt was on pred at 10 mg/d over a month before admission.  Plan  Cont pulmicort, brovana, duoneb. Cont medrol to 40 q6. Cont O2 if sats < 90%. Wean off. Keep O2 sats > 90%. Not on o2 at home.  On levaquin. (D4) -- for bronchitis. Plan for 5days.  CXR 3/24 -- no infiltrate/congestion.   2. Mild Volume Overload / Pulmonary edema. Clinically better on lasix. Still on the (-) side.  Will observe.  Hold off on lasix today.   3. HTN. BP better controlled this am.  Cont BP meds. If BP gets elevated, try to increase norvasc dose to 10 mg/d.   4. CKD st III Creat at baseline. Making urine.   5. Plan d/w pt.Slow to improve. No obvious infxn. Possible aecopd 2 to viral infxn. Possible d/c Sunday or Monday.  Needs close f/u and long pred taper.   6. DVT prophylaxis   J. Alexis Frock, MD 09/10/2015, 12:23 PM [facility] Pager [phone] After 3 pm or if no answer, call [phone]

## 2015-09-11 DIAGNOSIS — J111 Influenza due to unidentified influenza virus with other respiratory manifestations: Secondary | ICD-10-CM

## 2015-09-11 LAB — GLUCOSE, CAPILLARY: Glucose-Capillary: 176 mg/dL — ABNORMAL HIGH (ref 65–99)

## 2015-09-11 LAB — BASIC METABOLIC PANEL
ANION GAP: 13 (ref 5–15)
BUN: 41 mg/dL — AB (ref 6–20)
CHLORIDE: 101 mmol/L (ref 101–111)
CO2: 27 mmol/L (ref 22–32)
Calcium: 9.5 mg/dL (ref 8.9–10.3)
Creatinine, Ser: 2.17 mg/dL — ABNORMAL HIGH (ref 0.61–1.24)
GFR, EST AFRICAN AMERICAN: 30 mL/min — AB (ref >60)
GFR, EST NON AFRICAN AMERICAN: 26 mL/min — AB (ref >60)
Glucose, Bld: 281 mg/dL — ABNORMAL HIGH (ref 65–99)
POTASSIUM: 4.7 mmol/L (ref 3.5–5.1)
SODIUM: 141 mmol/L (ref 135–145)

## 2015-09-11 MED ORDER — METHYLPREDNISOLONE SODIUM SUCC 125 MG IJ SOLR
80.0000 mg | Freq: Two times a day (BID) | INTRAMUSCULAR | Status: DC
  Administered 2015-09-11 – 2015-09-12 (×2): 80 mg via INTRAVENOUS
  Filled 2015-09-11 (×3): qty 1.28

## 2015-09-11 MED ORDER — METHYLPREDNISOLONE SODIUM SUCC 40 MG IJ SOLR
40.0000 mg | Freq: Two times a day (BID) | INTRAMUSCULAR | Status: DC
  Filled 2015-09-11: qty 1

## 2015-09-11 MED ORDER — FUROSEMIDE 10 MG/ML IJ SOLN
40.0000 mg | Freq: Two times a day (BID) | INTRAMUSCULAR | Status: DC
  Administered 2015-09-11 – 2015-09-12 (×3): 40 mg via INTRAVENOUS
  Filled 2015-09-11 (×3): qty 4

## 2015-09-11 NOTE — Progress Notes (Signed)
   Name: Greg RobertsJonathan Jones MRN: [id number] DOB: 07/20/1927    ADMISSION DATE:  09/07/2015  CHIEF COMPLAINT:  Sob   BRIEF PATIENT DESCRIPTION: 82 year old male former smoker with severe COPD admitted from office 09/07/15 for COPD exacerbation +/- D CHF decompensation.   SIGNIFICANT EVENTS  09/07/15 admit from office   STUDIES:  PFTs 10/2014 showed FEV1 of 0.84-39%, FVC of 2.1-67%, without significant broncho-dilator response. DLCO was relatively preserved at 73%. 04/2015 Spirometry today showed FEV1 of 34%-0.81   SUBJECTIVE: in chair no distress, neg 650 cc   VITAL SIGNS: Temp:  [97.8 F (36.6 C)-98.1 F (36.7 C)] 98.1 F (36.7 C) (03/25 0506) Pulse Rate:  [80-98] 80 (03/25 0506) Resp:  [16-18] 16 (03/25 0506) BP: (135-163)/(71-72) 135/71 mmHg (03/25 0506) SpO2:  [93 %-99 %] 97 % (03/25 0753) Weight:  [83.2 kg (183 lb 6.8 oz)] 83.2 kg (183 lb 6.8 oz) (03/25 0513)  PHYSICAL EXAMINATION: General:awake in chair Neuro: nonfocal HEENT: jvd noted PULM: coarse reduced, wheezing bilateral insp and exp mild to moderate CV: s1 s2 RRR no r GI: soft, BS wnl no r Extremities: edema 1 plus equal   Recent Labs Lab 09/07/15 1713 09/08/15 0500 09/10/15 0509  NA 142 137 138  K 4.8 4.4 4.5  CL 104 101 102  CO2 25 23 26   BUN 26* 32* 40*  CREATININE 1.97* 2.43* 1.96*  GLUCOSE 144* 165* 167*    Recent Labs Lab 09/07/15 1713 09/08/15 0500 09/10/15 0509  HGB 14.0 13.4 13.6  HCT 42.2 42.2 41.7  WBC 11.2* 9.4 16.3*  PLT 230 243 227   Dg Chest Port 1 View  09/10/2015  CLINICAL DATA:  Shortness of breath . EXAM: PORTABLE CHEST 1 VIEW COMPARISON:  03/21/ 2017. FINDINGS: Mediastinum and hilar structures normal. Mild bibasilar subsegmental atelectasis. Heart size normal. No pleural effusion or pneumothorax. IMPRESSION: Mild bibasilar subsegmental atelectasis. Similar findings noted on prior exam. Electronically Signed   By: Greg Jones  Register   On: 09/10/2015 07:12    ASSESSMENT /  PLAN: 1. Severe Acute COPD exacerbation -refractory to OP treatment -- slow to improve. Pt was on pred at 10 mg/d over a month before admission.  Plan  Cont pulmicort, brovana, duoneb. Cont medrol but change to 80 q12h Cont O2 if sats < 90% currently on RA but wheezing worse On levaquin. (D5) -- for bronchitis - allow to dc in am dose 26th Repeat pcxr in am for continued reports SOB, sense is need further neg balance and improved bronchospasm control  2. Mild Volume Overload / Pulmonary edema. Clinically better on lasix. Still on the (-) side.  Lasix restart Chem bmet now and in am    3. HTN. BP better controlled this am.  Cont BP meds.norvasc  4. CKD st III Repeat bmet with lasix restart  5. Plan d/w pt.Slow to improve. No obvious infxn. Possible aecopd 2 to viral infxn. Possible d/c Monday he wheezing is worse today  6. DVT prophylaxis    Greg Jones J. Jones AliasFeinstein, MD, FACP Pgr: [phone] [facility]

## 2015-09-12 ENCOUNTER — Inpatient Hospital Stay (HOSPITAL_COMMUNITY): Payer: Medicare Other

## 2015-09-12 LAB — GLUCOSE, CAPILLARY: Glucose-Capillary: 184 mg/dL — ABNORMAL HIGH (ref 65–99)

## 2015-09-12 LAB — BASIC METABOLIC PANEL
ANION GAP: 13 (ref 5–15)
BUN: 46 mg/dL — ABNORMAL HIGH (ref 6–20)
CALCIUM: 8.7 mg/dL — AB (ref 8.9–10.3)
CO2: 27 mmol/L (ref 22–32)
Chloride: 98 mmol/L — ABNORMAL LOW (ref 101–111)
Creatinine, Ser: 2.31 mg/dL — ABNORMAL HIGH (ref 0.61–1.24)
GFR calc Af Amer: 28 mL/min — ABNORMAL LOW (ref >60)
GFR calc non Af Amer: 24 mL/min — ABNORMAL LOW (ref >60)
GLUCOSE: 238 mg/dL — AB (ref 65–99)
Potassium: 4.5 mmol/L (ref 3.5–5.1)
Sodium: 138 mmol/L (ref 135–145)

## 2015-09-12 LAB — CBC
HEMATOCRIT: 40.9 % (ref 39.0–52.0)
Hemoglobin: 13.3 g/dL (ref 13.0–17.0)
MCH: 26.7 pg (ref 26.0–34.0)
MCHC: 32.5 g/dL (ref 30.0–36.0)
MCV: 82 fL (ref 78.0–100.0)
Platelets: 221 10*3/uL (ref 150–400)
RBC: 4.99 MIL/uL (ref 4.22–5.81)
RDW: 17 % — AB (ref 11.5–15.5)
WBC: 13.9 10*3/uL — AB (ref 4.0–10.5)

## 2015-09-12 MED ORDER — METHYLPREDNISOLONE SODIUM SUCC 40 MG IJ SOLR
40.0000 mg | Freq: Two times a day (BID) | INTRAMUSCULAR | Status: DC
  Administered 2015-09-12 – 2015-09-13 (×2): 40 mg via INTRAVENOUS
  Filled 2015-09-12 (×3): qty 1

## 2015-09-12 NOTE — Progress Notes (Signed)
Pt oxygen saturation at rest 96% RA. Pt  Ambulated 150 feet and oxygen saturation was 94-95% on RA.

## 2015-09-12 NOTE — Progress Notes (Addendum)
   Name: Greg RobertsJonathan Jones MRN: [id number] DOB: 11/01/1927    ADMISSION DATE:  09/07/2015  CHIEF COMPLAINT:  Sob   BRIEF PATIENT DESCRIPTION: 82 year old male former smoker with severe COPD admitted from office 09/07/15 for COPD exacerbation +/- D CHF decompensation.   SIGNIFICANT EVENTS  09/07/15 admit from office   STUDIES:  PFTs 10/2014 showed FEV1 of 0.84-39%, FVC of 2.1-67%, without significant broncho-dilator response. DLCO was relatively preserved at 73%. 04/2015 Spirometry today showed FEV1 of 34%-0.81   SUBJECTIVE: IMproved after lasix restart Neg 1.8 liters He feels better   VITAL SIGNS: Temp:  [97.8 F (36.6 C)-97.9 F (36.6 C)] 97.9 F (36.6 C) (03/26 0437) Pulse Rate:  [67-88] 88 (03/26 0437) Resp:  [18-20] 18 (03/26 0437) BP: (131-159)/(66-68) 132/66 mmHg (03/26 0437) SpO2:  [95 %-97 %] 97 % (03/26 0437) Weight:  [82.8 kg (182 lb 8.7 oz)] 82.8 kg (182 lb 8.7 oz) (03/26 0437)  PHYSICAL EXAMINATION: General:awake in chair Neuro: nonfocal HEENT: jvd noted and reduced PULM: coarse reduced, wheezing bilateral insp and exp improved significantly CV: s1 s2 RRR no r GI: soft, BS wnl no r Extremities: edema 1 plus equal and reduced   Recent Labs Lab 09/10/15 0509 09/11/15 0942 09/12/15 0439  NA 138 141 138  K 4.5 4.7 4.5  CL 102 101 98*  CO2 26 27 27   BUN 40* 41* 46*  CREATININE 1.96* 2.17* 2.31*  GLUCOSE 167* 281* 238*    Recent Labs Lab 09/08/15 0500 09/10/15 0509 09/12/15 0439  HGB 13.4 13.6 13.3  HCT 42.2 41.7 40.9  WBC 9.4 16.3* 13.9*  PLT 243 227 221   Dg Chest Port 1 View  09/12/2015  CLINICAL DATA:  F/u flu, hx copd, htn, ex-smoker EXAM: PORTABLE CHEST 1 VIEW COMPARISON:  09/10/2015 FINDINGS: Stable cardiomediastinal silhouette. Mild bibasilar subsegmental atelectasis is improved. No active infiltrates or overt failure. No osseous findings. IMPRESSION: Slight improvement aeration.  No active infiltrates or failure. Electronically Signed   By:  Elsie StainJohn T Curnes M.D.   On: 09/12/2015 07:19    ASSESSMENT / PLAN: 1. Severe Acute COPD exacerbation -refractory to OP treatment -- slow to improve. Pt was on pred at 10 mg/d over a month before admission.  Plan  Cont pulmicort, brovana, duoneb. Reduce steroids On levaquin. - dc stop date in  pcxr is improved and he feels better after lasix restart but crt up, hold further as he has clinical progress and follow chem in am  Ambulatory pulse ox  2. Mild Volume Overload / Pulmonary edema. Clinically better on lasix 3/26 Lasix dc Chem bmet am  3. HTN. BP better controlled this am.  Cont BP meds.norvasc  4. CKD st III Repeat bmet am with lasix hold again  5. Plan d/w pt.Slow to improve. No obvious infxn. Possible aecopd 2 to viral infxn. Possible d/c Monday  6. DVT prophylaxis    Mcarthur Rossettianiel J. Tyson AliasFeinstein, MD, FACP Pgr: [phone] [facility]

## 2015-09-13 LAB — BASIC METABOLIC PANEL
Anion gap: 12 (ref 5–15)
BUN: 48 mg/dL — AB (ref 6–20)
CHLORIDE: 97 mmol/L — AB (ref 101–111)
CO2: 28 mmol/L (ref 22–32)
CREATININE: 2.45 mg/dL — AB (ref 0.61–1.24)
Calcium: 8.9 mg/dL (ref 8.9–10.3)
GFR calc non Af Amer: 22 mL/min — ABNORMAL LOW (ref >60)
GFR, EST AFRICAN AMERICAN: 26 mL/min — AB (ref >60)
Glucose, Bld: 200 mg/dL — ABNORMAL HIGH (ref 65–99)
POTASSIUM: 4.6 mmol/L (ref 3.5–5.1)
Sodium: 137 mmol/L (ref 135–145)

## 2015-09-13 LAB — GLUCOSE, CAPILLARY: GLUCOSE-CAPILLARY: 158 mg/dL — AB (ref 65–99)

## 2015-09-13 MED ORDER — FUROSEMIDE 20 MG PO TABS
20.0000 mg | ORAL_TABLET | Freq: Every day | ORAL | Status: DC
  Administered 2015-09-13 – 2015-09-14 (×2): 20 mg via ORAL
  Filled 2015-09-13 (×2): qty 1

## 2015-09-13 MED ORDER — PREDNISONE 20 MG PO TABS
40.0000 mg | ORAL_TABLET | Freq: Every day | ORAL | Status: DC
  Administered 2015-09-14: 40 mg via ORAL
  Filled 2015-09-13: qty 2

## 2015-09-13 MED ORDER — TIOTROPIUM BROMIDE MONOHYDRATE 18 MCG IN CAPS
18.0000 ug | ORAL_CAPSULE | Freq: Every day | RESPIRATORY_TRACT | Status: DC
  Administered 2015-09-14: 18 ug via RESPIRATORY_TRACT
  Filled 2015-09-13: qty 5

## 2015-09-13 MED ORDER — MOMETASONE FURO-FORMOTEROL FUM 200-5 MCG/ACT IN AERO
2.0000 | INHALATION_SPRAY | Freq: Two times a day (BID) | RESPIRATORY_TRACT | Status: DC
  Administered 2015-09-13 – 2015-09-14 (×3): 2 via RESPIRATORY_TRACT
  Filled 2015-09-13: qty 8.8

## 2015-09-13 NOTE — Discharge Summary (Signed)
Physician Discharge Summary       Patient ID: Greg Jones MRN: [id number] DOB/AGE: 80/11/1927 80 y.o.  Admit date: 09/07/2015 Discharge date: 09/14/2015  Discharge Diagnoses:  Acute exacerbation of COPD Volume overload Chronic kidney disease stage: 3 HTN GERD  Detailed Hospital Course:   82 year old male who presents for an acute office visit to office on 3/21 w/ c/o prod cough with clear mucus, wheezing, SOB at times, chest congestion/tightness starting on 2 weeks ago PTA. Worse for last 3 days PTA . SOB was much worse. Took 3 neb treatments since 20th, but did not help. Legs were more swollen.Denied any sinus drainage/congestion, fever, nausea or vomiting. Seen by PCP 2 weeks ago started on HCTZ and pred taper. Previously on ACE , stopped in Nov 2016 due to cough.   Last cxr 04/2015 with chronic changes BB atx. Vs scarring .[facility]. Was treated in the usual fashion with supplemental oxygen, inhaled broncho-dilators, systemic steroids and brief antibiotics. We also added IV diuresis and he did seem to respond positively with this intervention. We were slowly able to step down his therapy. His antibiotics were stopped, his systemic steroids stepped back and broncho-dilator regimen switched back to his home routine. As of 3/28 he had made significant improvement with in-patient care and was deemed ready for d/c with the following plan of care as outlined below.    Discharge Plan by active problems  Severe Acute COPD exacerbation  Plan  Convert BD's back to home regimen steroids to pred and taper, final dose 10mg  daily  Mild Volume Overload / Pulmonary edema. Clinically better on lasix 3/26 Plan Follow BMP Low dose scheduled lasix  HTN Plan Cont .norvasc  CKD stage III Plan Cont lasix Follow BMP   Significant Hospital tests/ studies  Consults   Discharge Exam: BP 165/65 mmHg  Pulse 69  Temp(Src) 97.8 F (36.6 C) (Oral)  Resp 20  Ht 5'  7" (1.702 m)  Wt 183 lb 6.8 oz (83.2 kg)  BMI 28.72 kg/m2  SpO2 95%  General:awake in chair Neuro: nonfocal HEENT: jvd noted and reduced PULM: coarse reduced, wheezing bilateral insp and exp improved significantly CV: s1 s2 RRR no r GI: soft, BS wnl no r Extremities: edema 1 plus equal and reduced  Labs at discharge Lab Results  Component Value Date   CREATININE 2.11* 09/14/2015   BUN 43* 09/14/2015   NA 137 09/14/2015   K 4.6 09/14/2015   CL 99* 09/14/2015   CO2 29 09/14/2015   Lab Results  Component Value Date   WBC 13.9* 09/12/2015   HGB 13.3 09/12/2015   HCT 40.9 09/12/2015   MCV 82.0 09/12/2015   PLT 221 09/12/2015   Lab Results  Component Value Date   ALT 32 09/07/2015   AST 30 09/07/2015   ALKPHOS 64 09/07/2015   BILITOT 0.4 09/07/2015   No results found for: INR, PROTIME  Current radiology studies No results found.  Disposition:  Final discharge disposition not confirmed      Discharge Instructions    Diet - low sodium heart healthy    Complete by:  As directed      Discharge instructions    Complete by:  As directed   After you complete the current prednisone taper resume your 10mg  prednisone daily.     Increase activity slowly    Complete by:  As directed             Medication  List    STOP taking these medications        hydrochlorothiazide 12.5 MG tablet  Commonly known as:  HYDRODIURIL      TAKE these medications        albuterol 108 (90 Base) MCG/ACT inhaler  Commonly known as:  PROVENTIL HFA;VENTOLIN HFA  Inhale 1-2 puffs into the lungs every 6 (six) hours as needed for wheezing or shortness of breath.     albuterol (2.5 MG/3ML) 0.083% nebulizer solution  Commonly known as:  PROVENTIL  Take 3 mLs (2.5 mg total) by nebulization every 6 (six) hours as needed for wheezing or shortness of breath. Reported on 07/14/2015     amLODipine 5 MG tablet  Commonly known as:  NORVASC  Take 1 tablet (5 mg total) by mouth daily.      AQUAPHOR EX  Apply 1 application topically daily as needed (dry skin. (hands)).     budesonide-formoterol 160-4.5 MCG/ACT inhaler  Commonly known as:  SYMBICORT  Inhale 2 puffs into the lungs 2 (two) times daily.     cholecalciferol 400 units Tabs tablet  Commonly known as:  VITAMIN D  Take 400 Units by mouth 2 (two) times daily.     furosemide 20 MG tablet  Commonly known as:  LASIX  Take 1 tablet (20 mg total) by mouth daily.     guaiFENesin 100 MG/5ML liquid  Commonly known as:  ROBITUSSIN  Take 200 mg by mouth 3 (three) times daily as needed for cough.     MUCINEX FAST-MAX 10-650-400 MG/20ML Liqd  Generic drug:  Phenylephrine-APAP-Guaifenesin  Take 30 mLs by mouth daily.     omeprazole 20 MG capsule  Commonly known as:  PRILOSEC  Take 20 mg by mouth daily.     predniSONE 10 MG tablet  Commonly known as:  DELTASONE  Take 4 tabs  daily with food x 4 days, then 3 tabs daily x 4 days, then 2 tabs daily x 4 days, then 1 tab daily x4 days then stop. #40     prochlorperazine 5 MG tablet  Commonly known as:  COMPAZINE  Take 5 mg by mouth every 6 (six) hours as needed for nausea or vomiting. Reported on 07/14/2015     tiotropium 18 MCG inhalation capsule  Commonly known as:  SPIRIVA  Place 18 mcg into inhaler and inhale daily.       Follow-up Information    Follow up with [facility], NP On 09/22/2015.   Specialty:  Pulmonary Disease   Why:  0930am    Contact information:   [address] [place] [address] [phone]       Discharged Condition: good  Physician Statement:   The Patient was personally examined, the discharge assessment and plan has been personally reviewed and I agree with ACNP Babcock's assessment and plan. > 30 minutes of time have been dedicated to discharge assessment, planning and discharge instructions.   Signed: Shelby Mattocks 09/14/2015, 1:24 PM   Attending Note:  I have examined patient, reviewed labs, studies and notes. I have  discussed the case with Kreg Shropshire, and I agree with the data and plans as amended above.   Levy Pupa, MD, PhD 09/14/2015, 5:35 PM [facility] [phone] or if no answer [phone]

## 2015-09-13 NOTE — Progress Notes (Signed)
   Name: Greg RobertsJonathan Jones MRN: [id number] DOB: 07/01/1927    ADMISSION DATE:  09/07/2015  CHIEF COMPLAINT:  Sob   BRIEF PATIENT DESCRIPTION: 82 year old male former smoker with severe COPD admitted from office 09/07/15 for COPD exacerbation +/- D CHF decompensation.   SIGNIFICANT EVENTS  09/07/15 admit from office   STUDIES:  PFTs 10/2014 showed FEV1 of 0.84-39%, FVC of 2.1-67%, without significant broncho-dilator response. DLCO was relatively preserved at 73%. 04/2015 Spirometry today showed FEV1 of 34%-0.81   SUBJECTIVE:    VITAL SIGNS: Temp:  [98 F (36.7 C)-98.4 F (36.9 C)] 98.4 F (36.9 C) (03/27 0615) Pulse Rate:  [73-84] 84 (03/27 0615) Resp:  [18] 18 (03/27 0615) BP: (132-164)/(52-72) 132/52 mmHg (03/27 0615) SpO2:  [96 %-98 %] 96 % (03/27 0615) Weight:  [83.1 kg (183 lb 3.2 oz)] 83.1 kg (183 lb 3.2 oz) (03/27 0615)  PHYSICAL EXAMINATION: General:awake in chair Neuro: nonfocal HEENT: jvd noted and reduced PULM: coarse reduced, wheezing bilateral insp and exp improved significantly CV: s1 s2 RRR no r GI: soft, BS wnl no r Extremities: edema 1 plus equal and reduced   Recent Labs Lab 09/11/15 0942 09/12/15 0439 09/13/15 0525  NA 141 138 137  K 4.7 4.5 4.6  CL 101 98* 97*  CO2 27 27 28   BUN 41* 46* 48*  CREATININE 2.17* 2.31* 2.45*  GLUCOSE 281* 238* 200*    Recent Labs Lab 09/08/15 0500 09/10/15 0509 09/12/15 0439  HGB 13.4 13.6 13.3  HCT 42.2 41.7 40.9  WBC 9.4 16.3* 13.9*  PLT 243 227 221   Dg Chest Port 1 View  09/12/2015  CLINICAL DATA:  F/u flu, hx copd, htn, ex-smoker EXAM: PORTABLE CHEST 1 VIEW COMPARISON:  09/10/2015 FINDINGS: Stable cardiomediastinal silhouette. Mild bibasilar subsegmental atelectasis is improved. No active infiltrates or overt failure. No osseous findings. IMPRESSION: Slight improvement aeration.  No active infiltrates or failure. Electronically Signed   By: Elsie StainJohn T Curnes M.D.   On: 09/12/2015 07:19    ASSESSMENT /  PLAN: 1. Severe Acute COPD exacerbation -refractory to OP treatment -- slow to improve. Pt was on pred at 10 mg/d over a month before admission.  Plan  Convert BD's back to home regimen Elwin Sleight(Dulera is formulary substitute for Symbicort) Change steroids to pred and taper, final dose 10mg  daily Levaquin, completed Consider start maintenance dose lasix - CXR and clinically improved Ambulatory pulse ox  2. Mild Volume Overload / Pulmonary edema. Clinically better on lasix 3/26 Follow BMP Low dose scheduled lasix  3. HTN Cont BP meds.norvasc  4. CKD  Follow BMP  5. Plan d/w pt.Slow to improve. No obvious infxn. Possible aecopd 2 to viral infxn.   6. DVT prophylaxis  Goal home tomorrow 3/28   Levy Pupaobert Isha Seefeld, MD, PhD 09/13/2015, 11:36 AM [facility] [phone] or if no answer [phone]

## 2015-09-14 ENCOUNTER — Encounter (HOSPITAL_COMMUNITY): Payer: Medicare Other

## 2015-09-14 LAB — BASIC METABOLIC PANEL
ANION GAP: 9 (ref 5–15)
BUN: 43 mg/dL — ABNORMAL HIGH (ref 6–20)
CO2: 29 mmol/L (ref 22–32)
Calcium: 8.8 mg/dL — ABNORMAL LOW (ref 8.9–10.3)
Chloride: 99 mmol/L — ABNORMAL LOW (ref 101–111)
Creatinine, Ser: 2.11 mg/dL — ABNORMAL HIGH (ref 0.61–1.24)
GFR calc Af Amer: 31 mL/min — ABNORMAL LOW (ref >60)
GFR, EST NON AFRICAN AMERICAN: 27 mL/min — AB (ref >60)
GLUCOSE: 138 mg/dL — AB (ref 65–99)
POTASSIUM: 4.6 mmol/L (ref 3.5–5.1)
Sodium: 137 mmol/L (ref 135–145)

## 2015-09-14 LAB — MAGNESIUM: Magnesium: 2.2 mg/dL (ref 1.7–2.4)

## 2015-09-14 LAB — GLUCOSE, CAPILLARY: Glucose-Capillary: 107 mg/dL — ABNORMAL HIGH (ref 65–99)

## 2015-09-14 MED ORDER — AMLODIPINE BESYLATE 5 MG PO TABS: 5.0000 mg | ORAL_TABLET | Freq: Every day | ORAL | Status: DC

## 2015-09-14 MED ORDER — FUROSEMIDE 20 MG PO TABS: 20.0000 mg | ORAL_TABLET | Freq: Every day | ORAL | Status: DC

## 2015-09-14 MED ORDER — PREDNISONE 10 MG PO TABS: ORAL_TABLET | ORAL | Status: DC

## 2015-09-14 NOTE — Care Management Note (Signed)
Case Management Note  Patient Details  Name: Cherlyn RobertsJonathan Echavarria MRN: [id number] Date of Birth: 08/17/1927  Subjective/Objective:                    Action/Plan:d/c home.   Expected Discharge Date:   (UNKNOWN)               Expected Discharge Plan:  Home/Self Care  In-House Referral:     Discharge planning Services  CM Consult  Post Acute Care Choice:    Choice offered to:     DME Arranged:    DME Agency:     HH Arranged:    HH Agency:     Status of Service:  Completed, signed off  Medicare Important Message Given:  Yes Date Medicare IM Given:    Medicare IM give by:    Date Additional Medicare IM Given:    Additional Medicare Important Message give by:     If discussed at Long Length of Stay Meetings, dates discussed:    Additional Comments:  Lanier ClamMahabir, Alyn Jurney, RN 09/14/2015, 1:27 PM

## 2015-09-14 NOTE — Discharge Instructions (Signed)
It is OK to use your current Prednisone and amlodipine at home as long as you can split them for correct dosing.  The amlodipine has been changed from 10 mg to 5mg   The prednisone dosing will go from 40 mg a day for 4 days then 30mg  a day for 4 days then 20 mg a day for 4 d and 10 mg a day and hold

## 2015-09-14 NOTE — Care Management Important Message (Signed)
Important Message  Patient Details  Name: Greg RobertsJonathan Jones MRN: [id number] Date of Birth: 07/14/1927   Medicare Important Message Given:  Yes    Haskell FlirtJamison, Aleksander Edmiston 09/14/2015, 11:07 AMImportant Message  Patient Details  Name: Greg RobertsJonathan Jones MRN: [id number] Date of Birth: 04/23/1928   Medicare Important Message Given:  Yes    Haskell FlirtJamison, Breeze Angell 09/14/2015, 11:06 AM

## 2015-09-15 ENCOUNTER — Ambulatory Visit: Payer: Medicare Other | Admitting: Pulmonary Disease

## 2015-09-16 ENCOUNTER — Encounter (HOSPITAL_COMMUNITY): Payer: Medicare Other

## 2015-09-21 ENCOUNTER — Encounter (HOSPITAL_COMMUNITY): Payer: Medicare Other

## 2015-09-22 ENCOUNTER — Ambulatory Visit (INDEPENDENT_AMBULATORY_CARE_PROVIDER_SITE_OTHER): Payer: Medicare Other | Admitting: Adult Health

## 2015-09-22 ENCOUNTER — Encounter: Payer: Self-pay | Admitting: Adult Health

## 2015-09-22 VITALS — BP 124/62 | HR 94 | Ht 67.5 in | Wt 186.2 lb

## 2015-09-22 DIAGNOSIS — J42 Unspecified chronic bronchitis: Secondary | ICD-10-CM

## 2015-09-22 DIAGNOSIS — I10 Essential (primary) hypertension: Secondary | ICD-10-CM

## 2015-09-22 NOTE — Progress Notes (Signed)
Chief Complaint  Patient presents with  . Hospitalization Follow-up    pt states breathing has improved since hosp. c/o SOB w/activity, prod cough clear in color, wheezing, chest pain/tightness.     Tests PFTs 10/2014 showed FEV1 of 0.84-39%, FVC of 2.1-67%, without significant broncho-dilator response. DLCO was relatively preserved at 73%. 04/2015 Spirometry showed FEV1 of 34%-0.81  Past medical hx Past Medical History  Diagnosis Date  . COPD (chronic obstructive pulmonary disease) (HCC)   . Hypertension   . Chronic kidney disease   . Respiratory insufficiency   . GERD (gastroesophageal reflux disease)      Past surgical hx, Allergies, Family hx, Social hx all reviewed.  Current Outpatient Prescriptions on File Prior to Visit  Medication Sig  . albuterol (PROVENTIL HFA;VENTOLIN HFA) 108 (90 Base) MCG/ACT inhaler Inhale 1-2 puffs into the lungs every 6 (six) hours as needed for wheezing or shortness of breath.  [name]. albuterol (PROVENTIL) (2.5 MG/3ML) 0.083% nebulizer solution Take 3 mLs (2.5 mg total) by nebulization every 6 (six) hours as needed for wheezing or shortness of breath. Reported on 07/14/2015  . amLODipine (NORVASC) 5 MG tablet Take 1 tablet (5 mg total) by mouth daily.  . budesonide-formoterol (SYMBICORT) 160-4.5 MCG/ACT inhaler Inhale 2 puffs into the lungs 2 (two) times daily.  . cholecalciferol (VITAMIN D) 400 UNITS TABS tablet Take 400 Units by mouth 2 (two) times daily.  . Emollient (AQUAPHOR EX) Apply 1 application topically daily as needed (dry skin. (hands)).  . furosemide (LASIX) 20 MG tablet Take 1 tablet (20 mg total) by mouth daily.  [name]. guaiFENesin (ROBITUSSIN) 100 MG/5ML liquid Take 200 mg by mouth 3 (three) times daily as needed for cough.  [name]. omeprazole (PRILOSEC) 20 MG capsule Take 20 mg by mouth daily.  [name]. Phenylephrine-APAP-Guaifenesin (MUCINEX FAST-MAX) 10-650-400 MG/20ML LIQD Take 30 mLs by mouth daily.  . predniSONE (DELTASONE) 10 MG tablet Take 4 tabs   daily with food x 4 days, then 3 tabs daily x 4 days, then 2 tabs daily x 4 days, then 1 tab daily x4 days then stop. #40  . prochlorperazine (COMPAZINE) 5 MG tablet Take 5 mg by mouth every 6 (six) hours as needed for nausea or vomiting. Reported on 07/14/2015  . tiotropium (SPIRIVA) 18 MCG inhalation capsule Place 18 mcg into inhaler and inhale daily.   No current facility-administered medications on file prior to visit.     Vital Signs BP 124/62 mmHg  Pulse 94  Ht 5' 7.5" (1.715 m)  Wt 186 lb 3.2 oz (84.46 kg)  BMI 28.72 kg/m2  SpO2 97%  History of Present Illness Greg RobertsJonathan Enloe is a 82 y.o. male former smoker with severe COPD on chronic prednisone, NOT on home O2, HTN, CKD with recent hospitalization 3/21-3/28 for severe AECOPD refractory to outpt rx as well as pulmonary edema. Treated with IV steroids, BD's, IV diuresis.  He returns today for hospital follow up.  He is feeling much better since hospitalization but not yet back to baseline.  Still with intermittent minimal cough productive of white sputum.  He has been afebrile.  Remains on last day of 30mg  prednisone, will drop to 20mg  tomorrow.  Did wake up a bit wheezy this am and used albuterol neb with sig improvement.  First time he has needed a PRN SABA since discharge.   Denies chest pain, purulent sputum, fevers/chills, hemoptysis, weight change, edema, orthopnea.    Physical Exam  General - pleasant male No distress  ENT - No sinus  tenderness, no oral exudate, no LAN Cardiac - s1s2 regular, no murmur Chest - resps even, non labored on RA, occasional pursed lip breathing, speaks in full sentences, diminished throughout, no audible wheeze  Back - No focal tenderness Abd - Soft, non-tender Ext - No edema Neuro - Normal strength Skin - No rashes Psych - normal mood, and behavior   Assessment/Plan  Severe COPD  HTN  Volume overload   Patient Instructions  So glad you are feeling better after the hospital!!  Continue  prednisone taper back to your /day maintenance dose  Continue lasix  Follow up with your primary doctor in the next 1-2 months  Continue symbicort, spiriva  Follow up with Dr. Vassie Loll, Babette Relic or [place] in 3 months  Please contact office for sooner follow up if needed      Dirk Dress, NP 09/22/2015  11:29 AM

## 2015-09-22 NOTE — Progress Notes (Signed)
Reviewed & agree with plan  

## 2015-09-22 NOTE — Patient Instructions (Signed)
So glad you are feeling better after the hospital!!  Continue prednisone taper back to your 10mg /day maintenance dose  Continue lasix  Follow up with your primary doctor in the next 1-2 months  Continue symbicort, spiriva  Follow up with Dr. Vassie LollAlva, Babette Relicammy or [place] in 3 months  Please contact office for sooner follow up if needed

## 2015-09-23 ENCOUNTER — Encounter (HOSPITAL_COMMUNITY): Payer: Medicare Other

## 2015-09-28 ENCOUNTER — Encounter (HOSPITAL_COMMUNITY): Payer: Medicare Other

## 2015-09-30 ENCOUNTER — Encounter (HOSPITAL_COMMUNITY): Payer: Medicare Other

## 2015-10-05 ENCOUNTER — Encounter (HOSPITAL_COMMUNITY): Payer: Medicare Other

## 2015-10-07 ENCOUNTER — Encounter (HOSPITAL_COMMUNITY): Payer: Medicare Other

## 2015-11-05 ENCOUNTER — Telehealth: Payer: Self-pay | Admitting: Pulmonary Disease

## 2015-11-05 DIAGNOSIS — J449 Chronic obstructive pulmonary disease, unspecified: Secondary | ICD-10-CM

## 2015-11-05 NOTE — Telephone Encounter (Signed)
LMTCB for Greg Jones 

## 2015-11-08 NOTE — Telephone Encounter (Signed)
Spoke with Kirt BoysMolly at [facility], requesting we place an order for pt for pulm rehab.   RA ok to place order?  Thanks!

## 2015-11-09 NOTE — Telephone Encounter (Signed)
ok 

## 2015-11-09 NOTE — Telephone Encounter (Signed)
Order placed. Nothing further needed. 

## 2015-11-11 ENCOUNTER — Telehealth: Payer: Self-pay | Admitting: Pulmonary Disease

## 2015-11-11 NOTE — Telephone Encounter (Signed)
Referral to Pulm Rehab was ordered per the 5.19.17 phone note  Spoke with Greg Jones, pt has been to pulmonary rehab in the past and used 23 of his lifetime visits - has 12 left to use under the diagnosis of COPD.  However if emphysema is an appropriate diagnosis, this would grant patient another 36 visits to use.  Emphysema is not on problem list Dr Vassie LollAlva please advise, thank you.

## 2015-11-12 NOTE — Telephone Encounter (Signed)
OK to use emphysema

## 2015-11-16 NOTE — Telephone Encounter (Signed)
Portia returned call and said that she wanted to put everything on hold for right now, said that pt needed to have a cardio eval due to some other issues he is having, and said if you have questions, to give her a call.Caren GriffinsStanley A Dalton

## 2015-11-16 NOTE — Telephone Encounter (Signed)
Left msg with Liborio NixonJanice for Daniel Nonesortia to return call -- does she need a new order placed? Will send msg to triage for f/u as msg was created by Shanda BumpsJessica.

## 2015-11-16 NOTE — Telephone Encounter (Signed)
Called Pulm Rehab for Greg Jones [phone]- [phone] Greg Jones states that the patient's PCP referred him to Cardiology to rule out Pulm HTN and/or CHF Pt having severe bilateral lower leg edema and is unable to sit to even put his shoes on or able to bathe due to SOB.  Greg Jones states that they are putting Pulm Rehab on hold for now until he sees Cardiology evaluates him and rules out the above and [facility]. Will send to Dr Vassie LollAlva as Lorain ChildesFYI. Nothing further needed.

## 2015-11-19 ENCOUNTER — Telehealth: Payer: Self-pay | Admitting: Pulmonary Disease

## 2015-11-19 MED ORDER — PREDNISONE 10 MG PO TABS: 10.0000 mg | ORAL_TABLET | Freq: Every day | ORAL | Status: DC

## 2015-11-19 NOTE — Telephone Encounter (Signed)
Patient Instructions     So glad you are feeling better after the hospital!!  Continue prednisone taper back to your 10mg /day maintenance dose  Continue lasix  Follow up with your primary doctor in the next 1-2 months  Continue symbicort, spiriva  Follow up with Dr. Vassie LollAlva, Babette Relicammy or [place] in 3 months  Please contact office for sooner follow up if needed    Spoke with pt's sister. Pt needs a refill on Prednisone 10mg . Rx will be sent in. Nothing further was needed.

## 2016-01-04 ENCOUNTER — Other Ambulatory Visit: Payer: Self-pay | Admitting: Nephrology

## 2016-01-04 DIAGNOSIS — N183 Chronic kidney disease, stage 3 unspecified: Secondary | ICD-10-CM

## 2016-01-06 ENCOUNTER — Ambulatory Visit
Admission: RE | Admit: 2016-01-06 | Discharge: 2016-01-06 | Disposition: A | Discharge status: Home / Self Care | Payer: Medicare Other | Source: Ambulatory Visit | Attending: Nephrology | Admitting: Nephrology

## 2016-01-06 DIAGNOSIS — N183 Chronic kidney disease, stage 3 unspecified: Secondary | ICD-10-CM

## 2016-01-11 ENCOUNTER — Encounter: Payer: Self-pay | Admitting: Pulmonary Disease

## 2016-01-11 ENCOUNTER — Ambulatory Visit (INDEPENDENT_AMBULATORY_CARE_PROVIDER_SITE_OTHER): Payer: Medicare Other | Admitting: Pulmonary Disease

## 2016-01-11 DIAGNOSIS — I5042 Chronic combined systolic (congestive) and diastolic (congestive) heart failure: Secondary | ICD-10-CM | POA: Insufficient documentation

## 2016-01-11 DIAGNOSIS — I5022 Chronic systolic (congestive) heart failure: Secondary | ICD-10-CM

## 2016-01-11 DIAGNOSIS — J441 Chronic obstructive pulmonary disease with (acute) exacerbation: Secondary | ICD-10-CM

## 2016-01-11 NOTE — Patient Instructions (Signed)
Stay on Symbicort and Spiriva Increase prednisone to 20 mg for one week or until breathing better, then drop back down to 10 mg daily Take Mucinex 600 twice daily for 3 days

## 2016-01-11 NOTE — Assessment & Plan Note (Signed)
Keep weight at around 190 pounds with Lasix 80 twice daily

## 2016-01-11 NOTE — Progress Notes (Signed)
   Subjective:    Patient ID: Greg Jones, male    DOB: 06-24-1927, 82 y.o.   MRN: [id number]  HPI  82 year old  for FU of COPD . He smoked about half pack per day for 70 years-about 35 pack years before he quit in 11-05-2012. His wife passed away in 2023/01/06, and he moved to [place] to be with his sister. He also has hypertension & CK D stage 4. He sees [place] for systolic CHF  01/11/2016  Chief Complaint  Patient presents with  . Follow-up    very short winded, wheezing, took neb treatment this morning, still wheezing. no energy.       He is maintained on a regimen of Symbicort and Spiriva and uses albuterol on an as-needed basis  [facility] 08/2015 for COPD flare vs edema  He developed increasing edema and his Lasix was increased to 80 mg twice daily-with this edema has subsided but he continues to have occasional wheezing and cough productive of clear sputum He remains on 10 mg of prednisone daily His weight has been tightly maintained around 190 pounds  Significant tests/ events  PFTs 10/2014 showed FEV1 of 0.84-39%, FVC of 2.1-67%, without significant broncho-dilator response. DLCO was relatively preserved at 73%. 04/2015 Spirometry showed FEV1 of 34%-0.81  Review of Systems neg for any significant sore throat, dysphagia, itching, sneezing, nasal congestion or excess/ purulent secretions, fever, chills, sweats, unintended wt loss, pleuritic or exertional cp, hempoptysis, orthopnea pnd or change in chronic leg swelling. Also denies presyncope, palpitations, heartburn, abdominal pain, nausea, vomiting, diarrhea or change in bowel or urinary habits, dysuria,hematuria, rash, arthralgias, visual complaints, headache, numbness weakness or ataxia.     Objective:   Physical Exam  Gen. Pleasant, well-nourished, in no distress ENT - no lesions, no post nasal drip Neck: No JVD, no thyromegaly, no carotid bruits Lungs: no use of accessory muscles, no dullness to percussion, clear without  rales, Scattered rhonchi on forced expiration Cardiovascular: Rhythm regular, heart sounds  normal, no murmurs or gallops, no peripheral edema Musculoskeletal: No deformities, no cyanosis or clubbing        Assessment & Plan:

## 2016-01-11 NOTE — Assessment & Plan Note (Signed)
Stay on Symbicort and Spiriva Increase prednisone to 20 mg for one week or until breathing better, then drop back down to 10 mg daily Take Mucinex 600 twice daily for 3 days  Does not qualify for oxygen yet

## 2016-01-21 ENCOUNTER — Telehealth: Payer: Self-pay | Admitting: Pulmonary Disease

## 2016-01-21 NOTE — Telephone Encounter (Signed)
Take an extra dose of Lasix for 2 days

## 2016-01-21 NOTE — Telephone Encounter (Signed)
Called and lmomtcb x 1 to make pt aware of RA recs.

## 2016-01-21 NOTE — Telephone Encounter (Signed)
Called and spoke with pt and she stated that they were told if the pt gained 5 lbs in 1 week to call for update.  Pt told his wife that he weighed yesterday and was 190 and weighed this morning and he was 195.4.  She stated that the pt is doing well this morning and feeling good, no complaints.  pts wife wanted to call and make sure there was nothing else that she needed to do for the pt.  RA please advise. Thanks  Allergies  Allergen Reactions  . Other     Oxygen Sensitive  . Penicillins Rash    Any -illins Has patient had a PCN reaction causing immediate rash, facial/tongue/throat swelling, SOB or lightheadedness with hypotension: Yes Has patient had a PCN reaction causing severe rash involving mucus membranes or skin necrosis: Yes Has patient had a PCN reaction that required hospitalization No Has patient had a PCN reaction occurring within the last 10 years: No If all of the above answers are "NO", then may proceed with Cephalosporin use.   . Vitamin B12 Itching and Rash    injections

## 2016-01-24 NOTE — Telephone Encounter (Signed)
Called and spoke with pts wife and she stated that she did watch the pt over the weekend and he never did have any complaints.  She stated that on Saturday his weight went down by 1 pound and on Sunday he was down 3 pounds.  She is aware of RA recs and will remember this for future reference.

## 2016-01-28 ENCOUNTER — Other Ambulatory Visit: Payer: Self-pay | Admitting: Gastroenterology

## 2016-01-28 DIAGNOSIS — R1084 Generalized abdominal pain: Secondary | ICD-10-CM

## 2016-01-28 DIAGNOSIS — R112 Nausea with vomiting, unspecified: Secondary | ICD-10-CM

## 2016-01-28 DIAGNOSIS — R140 Abdominal distension (gaseous): Secondary | ICD-10-CM

## 2016-01-31 ENCOUNTER — Other Ambulatory Visit: Payer: Medicare Other

## 2016-02-07 ENCOUNTER — Ambulatory Visit
Admission: RE | Admit: 2016-02-07 | Discharge: 2016-02-07 | Disposition: A | Discharge status: Home / Self Care | Payer: Medicare Other | Source: Ambulatory Visit | Attending: Gastroenterology | Admitting: Gastroenterology

## 2016-02-07 DIAGNOSIS — R1084 Generalized abdominal pain: Secondary | ICD-10-CM

## 2016-02-07 DIAGNOSIS — R112 Nausea with vomiting, unspecified: Secondary | ICD-10-CM

## 2016-02-07 DIAGNOSIS — R140 Abdominal distension (gaseous): Secondary | ICD-10-CM

## 2016-02-15 ENCOUNTER — Ambulatory Visit (INDEPENDENT_AMBULATORY_CARE_PROVIDER_SITE_OTHER): Payer: Medicare Other | Admitting: Adult Health

## 2016-02-15 ENCOUNTER — Encounter: Payer: Self-pay | Admitting: Adult Health

## 2016-02-15 DIAGNOSIS — I5022 Chronic systolic (congestive) heart failure: Secondary | ICD-10-CM

## 2016-02-15 DIAGNOSIS — J441 Chronic obstructive pulmonary disease with (acute) exacerbation: Secondary | ICD-10-CM

## 2016-02-15 DIAGNOSIS — Z23 Encounter for immunization: Secondary | ICD-10-CM

## 2016-02-15 MED ORDER — ALBUTEROL SULFATE (2.5 MG/3ML) 0.083% IN NEBU: 2.5000 mg | INHALATION_SOLUTION | Freq: Four times a day (QID) | RESPIRATORY_TRACT | 5 refills | Status: DC | PRN

## 2016-02-15 NOTE — Patient Instructions (Addendum)
Continue on Symbicort and Spiriva daily .  Remain on Prednisone 10mg  daily .  Mucinex DM Twice daily  As needed  Cough/congestion  Flu shot today .  Follow up with Primary MD for blood pressure management .  Follow up with Dr. Vassie LollAlva  In 3 months and As needed

## 2016-02-15 NOTE — Assessment & Plan Note (Signed)
Has some mild peripheral edema , goal weight is up some but does not appear in overt acute CHF - Will cont on current dose of lasix with his CKD   Plan Patient Instructions  Continue on Symbicort and Spiriva daily .  Remain on Prednisone 10mg  daily .  Mucinex DM Twice daily  As needed  Cough/congestion  Flu shot today .  Follow up with Primary MD for blood pressure management .  Follow up with Dr. Vassie LollAlva  In 3 months and As needed

## 2016-02-15 NOTE — Assessment & Plan Note (Signed)
Recent flare now resolving  Pt has multiple comorbidities that affect his breathing -currently appears at baseline   Plan  Patient Instructions  Continue on Symbicort and Spiriva daily .  Remain on Prednisone 10mg  daily .  Mucinex DM Twice daily  As needed  Cough/congestion  Flu shot today .  Follow up with Primary MD for blood pressure management .  Follow up with Dr. Vassie LollAlva  In 3 months and As needed

## 2016-02-15 NOTE — Addendum Note (Signed)
Addended by: Karalee HeightOX, Doren Kaspar P on: 02/15/2016 03:29 PM   Modules accepted: Orders

## 2016-02-15 NOTE — Progress Notes (Signed)
Subjective:    Patient ID: Cherlyn RobertsJonathan Prokop, male    DOB: 08/06/1927, 82 y.o.   MRN: [id number]  HPI 82 year old male former smoker with severe COPD seen for pulmonary consult 05/10/2015 by Dr. Vassie LollAlva  TEST :  PFTs 10/2014 showed FEV1 of 0.84-39%, FVC of 2.1-67%, without significant broncho-dilator response. DLCO was relatively preserved at 73%. 04/2015 Spirometry today showed FEV1 of 34%-0.81  02/15/2016  Follow up :  COPD  Pt presents for a 1 month follow up for severe COPD Seen last ov with mild COPD flare , tx w/ increased steroid dose w/ prednisone 20mg  daily for 1 week then back to 10mg  . He says he is some better . Baseline he gets winded with walking. Has daily mucus and cough . No fevere or discolored mucus.  Has CHF,  goal weight at 190lbs, and cont on lasix 80mg  Twice daily  .  Home weights range 190-195 . Leg swelling is down today.  Scr is >2.    Denies any chest pain , fever, nausea or vomiting.  He was remains on Symbicort and Spiriva..   Recently underwent ear tubes on right ear for eustachian tube dysfunction.   Recently found AAA at 3.6cm on CT ABD , will cont to follow this serially.  Follows with Dr. Algie CofferKadakia , recent echo showed EF at 50%, Was started on Imdur.   Has CKD , Had renal US that showed chronic kidney disease.  [facility] [place].       Past Medical History:  Diagnosis Date  . Chronic kidney disease   . COPD (chronic obstructive pulmonary disease) (HCC)   . GERD (gastroesophageal reflux disease)   . Hypertension   . Respiratory insufficiency    Current Outpatient Prescriptions on File Prior to Visit  Medication Sig Dispense Refill  . albuterol (PROVENTIL HFA;VENTOLIN HFA) 108 (90 Base) MCG/ACT inhaler Inhale 1-2 puffs into the lungs every 6 (six) hours as needed for wheezing or shortness of breath.    [name]. albuterol (PROVENTIL) (2.5 MG/3ML) 0.083% nebulizer solution Take 3 mLs (2.5 mg total) by nebulization every 6 (six) hours as  needed for wheezing or shortness of breath. Reported on 07/14/2015 75 mL 5  . amLODipine (NORVASC) 5 MG tablet Take 1 tablet (5 mg total) by mouth daily. 30 tablet 6  . budesonide-formoterol (SYMBICORT) 160-4.5 MCG/ACT inhaler Inhale 2 puffs into the lungs 2 (two) times daily.    . cholecalciferol (VITAMIN D) 400 UNITS TABS tablet Take 400 Units by mouth 2 (two) times daily.    . Emollient (AQUAPHOR EX) Apply 1 application topically daily as needed (dry skin. (hands)).    . furosemide (LASIX) 20 MG tablet Take 1 tablet (20 mg total) by mouth daily. (Patient taking differently: Take 80 mg by mouth daily. ) 30 tablet 6  . predniSONE (DELTASONE) 10 MG tablet Take 1 tablet (10 mg total) by mouth daily with breakfast. 30 tablet 5  . tiotropium (SPIRIVA) 18 MCG inhalation capsule Place 18 mcg into inhaler and inhale daily.     No current facility-administered medications on file prior to visit.      Review of Systems Constitutional:   No  weight loss, night sweats,  Fevers, chills,  +fatigue, or  lassitude.  HEENT:   No headaches,  Difficulty swallowing,  Tooth/dental problems, or  Sore throat,                No sneezing, itching, ear ache,  +nasal  congestion, post nasal drip,   CV:  No chest pain,  Orthopnea, PND, swelling in lower extremities, anasarca, dizziness, palpitations, syncope.   GI  No heartburn, indigestion, abdominal pain, nausea, vomiting, diarrhea, change in bowel habits, loss of appetite, bloody stools.   Resp:    No chest wall deformity  Skin: no rash or lesions.  GU: no dysuria, change in color of urine, no urgency or frequency.  No flank pain, no hematuria   MS:  No joint pain or swelling.  No decreased range of motion.  No back pain.  Psych:  No change in mood or affect. No depression or anxiety.  No memory loss.         Objective:   Physical Exam  Vitals:   02/15/16 1422  BP: (!) 156/84  Pulse: 99  Temp: 97.5 F (36.4 C)  TempSrc: Oral  SpO2: 95%    Weight: 199 lb (90.3 kg)  Height: 5\' 7"  (1.702 m)    GEN: A/Ox3; pleasant , NAD, chronically ill-appearing   HEENT:  [place]/AT,  EACs-clear, TMs-wnl, NOSE-clear, THROAT-clear, no lesions, no postnasal drip or exudate noted.   NECK:  Supple w/ fair ROM; no JVD; normal carotid impulses w/o bruits; no thyromegaly or nodules palpated; no lymphadenopathy.    RESP  Few rhonchi , no dullness to percussion  CARD: RRR, no m/r/g  , tr + peripheral edema, pulses intact, no cyanosis or clubbing.  GI:   Soft & nt; nml bowel sounds; no organomegaly or masses detected.   Musco: Warm bil, no deformities or joint swelling noted.   Neuro: alert, no focal deficits noted.    Skin: Warm, no lesions or rashes ' Praise Stennett NP-C  [facility]  02/15/2016

## 2016-02-17 NOTE — Progress Notes (Signed)
Reviewed & agree with plan  

## 2016-03-15 ENCOUNTER — Inpatient Hospital Stay (HOSPITAL_COMMUNITY)
Admission: AD | Admit: 2016-03-15 | Discharge: 2016-03-18 | DRG: 291 | Disposition: A | Discharge status: Home / Self Care | Payer: Medicare Other | Source: Ambulatory Visit | Attending: Cardiovascular Disease | Admitting: Cardiovascular Disease

## 2016-03-15 ENCOUNTER — Inpatient Hospital Stay (HOSPITAL_COMMUNITY): Payer: Medicare Other

## 2016-03-15 ENCOUNTER — Emergency Department (HOSPITAL_COMMUNITY)
Admission: EM | Admit: 2016-03-15 | Discharge: 2016-03-15 | Disposition: A | Discharge status: Home / Self Care | Payer: Medicare Other | Source: Home / Self Care

## 2016-03-15 ENCOUNTER — Encounter (HOSPITAL_COMMUNITY): Payer: Self-pay | Admitting: Emergency Medicine

## 2016-03-15 DIAGNOSIS — Z87891 Personal history of nicotine dependence: Secondary | ICD-10-CM

## 2016-03-15 DIAGNOSIS — Z5321 Procedure and treatment not carried out due to patient leaving prior to being seen by health care provider: Secondary | ICD-10-CM

## 2016-03-15 DIAGNOSIS — E876 Hypokalemia: Secondary | ICD-10-CM | POA: Diagnosis not present

## 2016-03-15 DIAGNOSIS — I509 Heart failure, unspecified: Secondary | ICD-10-CM

## 2016-03-15 DIAGNOSIS — I5023 Acute on chronic systolic (congestive) heart failure: Secondary | ICD-10-CM | POA: Diagnosis present

## 2016-03-15 DIAGNOSIS — I130 Hypertensive heart and chronic kidney disease with heart failure and stage 1 through stage 4 chronic kidney disease, or unspecified chronic kidney disease: Principal | ICD-10-CM | POA: Diagnosis present

## 2016-03-15 DIAGNOSIS — K219 Gastro-esophageal reflux disease without esophagitis: Secondary | ICD-10-CM | POA: Diagnosis present

## 2016-03-15 DIAGNOSIS — Z79899 Other long term (current) drug therapy: Secondary | ICD-10-CM | POA: Diagnosis not present

## 2016-03-15 DIAGNOSIS — R188 Other ascites: Secondary | ICD-10-CM

## 2016-03-15 DIAGNOSIS — T501X5A Adverse effect of loop [high-ceiling] diuretics, initial encounter: Secondary | ICD-10-CM | POA: Diagnosis present

## 2016-03-15 DIAGNOSIS — E669 Obesity, unspecified: Secondary | ICD-10-CM | POA: Diagnosis present

## 2016-03-15 DIAGNOSIS — Z6829 Body mass index (BMI) 29.0-29.9, adult: Secondary | ICD-10-CM

## 2016-03-15 DIAGNOSIS — Z7952 Long term (current) use of systemic steroids: Secondary | ICD-10-CM | POA: Diagnosis not present

## 2016-03-15 DIAGNOSIS — Z880 Allergy status to penicillin: Secondary | ICD-10-CM

## 2016-03-15 DIAGNOSIS — I5042 Chronic combined systolic (congestive) and diastolic (congestive) heart failure: Secondary | ICD-10-CM | POA: Diagnosis present

## 2016-03-15 DIAGNOSIS — Z887 Allergy status to serum and vaccine status: Secondary | ICD-10-CM | POA: Diagnosis not present

## 2016-03-15 DIAGNOSIS — I10 Essential (primary) hypertension: Secondary | ICD-10-CM | POA: Diagnosis present

## 2016-03-15 DIAGNOSIS — R0602 Shortness of breath: Secondary | ICD-10-CM | POA: Insufficient documentation

## 2016-03-15 DIAGNOSIS — J449 Chronic obstructive pulmonary disease, unspecified: Secondary | ICD-10-CM | POA: Diagnosis present

## 2016-03-15 DIAGNOSIS — N184 Chronic kidney disease, stage 4 (severe): Secondary | ICD-10-CM | POA: Diagnosis present

## 2016-03-15 DIAGNOSIS — Z7951 Long term (current) use of inhaled steroids: Secondary | ICD-10-CM | POA: Diagnosis not present

## 2016-03-15 LAB — BASIC METABOLIC PANEL
ANION GAP: 15 (ref 5–15)
BUN: 31 mg/dL — ABNORMAL HIGH (ref 6–20)
CALCIUM: 9.8 mg/dL (ref 8.9–10.3)
CHLORIDE: 99 mmol/L — AB (ref 101–111)
CO2: 24 mmol/L (ref 22–32)
CREATININE: 2.36 mg/dL — AB (ref 0.61–1.24)
GFR calc Af Amer: 27 mL/min — ABNORMAL LOW (ref >60)
GFR calc non Af Amer: 23 mL/min — ABNORMAL LOW (ref >60)
GLUCOSE: 117 mg/dL — AB (ref 65–99)
Potassium: 3.6 mmol/L (ref 3.5–5.1)
Sodium: 138 mmol/L (ref 135–145)

## 2016-03-15 LAB — CBC
HCT: 43.7 % (ref 39.0–52.0)
HEMOGLOBIN: 13.9 g/dL (ref 13.0–17.0)
MCH: 27.5 pg (ref 26.0–34.0)
MCHC: 31.8 g/dL (ref 30.0–36.0)
MCV: 86.5 fL (ref 78.0–100.0)
Platelets: 249 10*3/uL (ref 150–400)
RBC: 5.05 MIL/uL (ref 4.22–5.81)
RDW: 16.1 % — ABNORMAL HIGH (ref 11.5–15.5)
WBC: 14.2 10*3/uL — ABNORMAL HIGH (ref 4.0–10.5)

## 2016-03-15 LAB — TROPONIN I: Troponin I: 0.07 ng/mL (ref <0.03)

## 2016-03-15 LAB — I-STAT TROPONIN, ED: TROPONIN I, POC: 0.02 ng/mL (ref 0.00–0.08)

## 2016-03-15 LAB — BRAIN NATRIURETIC PEPTIDE: B NATRIURETIC PEPTIDE 5: 63.7 pg/mL (ref 0.0–100.0)

## 2016-03-15 MED ORDER — ONDANSETRON HCL 4 MG/2ML IJ SOLN: 4.0000 mg | Freq: Four times a day (QID) | INTRAMUSCULAR | Status: DC | PRN

## 2016-03-15 MED ORDER — SODIUM CHLORIDE 0.9 % IV SOLN: 250.0000 mL | INTRAVENOUS | Status: DC | PRN

## 2016-03-15 MED ORDER — TIOTROPIUM BROMIDE MONOHYDRATE 18 MCG IN CAPS
18.0000 ug | ORAL_CAPSULE | Freq: Every day | RESPIRATORY_TRACT | Status: DC
  Administered 2016-03-16 – 2016-03-18 (×3): 18 ug via RESPIRATORY_TRACT
  Filled 2016-03-15: qty 5

## 2016-03-15 MED ORDER — FUROSEMIDE 10 MG/ML IJ SOLN
40.0000 mg | Freq: Two times a day (BID) | INTRAMUSCULAR | Status: DC
  Administered 2016-03-15 – 2016-03-17 (×4): 40 mg via INTRAVENOUS
  Filled 2016-03-15 (×4): qty 4

## 2016-03-15 MED ORDER — AMLODIPINE BESYLATE 5 MG PO TABS
5.0000 mg | ORAL_TABLET | Freq: Every day | ORAL | Status: DC
  Administered 2016-03-16 – 2016-03-18 (×3): 5 mg via ORAL
  Filled 2016-03-15 (×3): qty 1

## 2016-03-15 MED ORDER — MOMETASONE FURO-FORMOTEROL FUM 200-5 MCG/ACT IN AERO
2.0000 | INHALATION_SPRAY | Freq: Two times a day (BID) | RESPIRATORY_TRACT | Status: DC
  Administered 2016-03-16 – 2016-03-18 (×5): 2 via RESPIRATORY_TRACT
  Filled 2016-03-15: qty 8.8

## 2016-03-15 MED ORDER — AQUAPHOR EX OINT
TOPICAL_OINTMENT | Freq: Every day | CUTANEOUS | Status: DC | PRN
  Filled 2016-03-15: qty 50

## 2016-03-15 MED ORDER — ALBUTEROL SULFATE (2.5 MG/3ML) 0.083% IN NEBU
2.5000 mg | INHALATION_SOLUTION | Freq: Four times a day (QID) | RESPIRATORY_TRACT | Status: DC | PRN
  Administered 2016-03-16 – 2016-03-17 (×4): 2.5 mg via RESPIRATORY_TRACT
  Filled 2016-03-15 (×4): qty 3

## 2016-03-15 MED ORDER — ACETAMINOPHEN 325 MG PO TABS: 650.0000 mg | ORAL_TABLET | ORAL | Status: DC | PRN

## 2016-03-15 MED ORDER — PREDNISONE 10 MG PO TABS
10.0000 mg | ORAL_TABLET | Freq: Every day | ORAL | Status: DC
  Administered 2016-03-16 – 2016-03-18 (×3): 10 mg via ORAL
  Filled 2016-03-15 (×3): qty 1

## 2016-03-15 MED ORDER — ALBUTEROL SULFATE HFA 108 (90 BASE) MCG/ACT IN AERS: 1.0000 | INHALATION_SPRAY | Freq: Four times a day (QID) | RESPIRATORY_TRACT | Status: DC | PRN

## 2016-03-15 MED ORDER — SODIUM CHLORIDE 0.9% FLUSH
3.0000 mL | Freq: Two times a day (BID) | INTRAVENOUS | Status: DC
  Administered 2016-03-15 – 2016-03-18 (×6): 3 mL via INTRAVENOUS

## 2016-03-15 MED ORDER — SODIUM CHLORIDE 0.9% FLUSH: 3.0000 mL | INTRAVENOUS | Status: DC | PRN

## 2016-03-15 MED ORDER — HEPARIN SODIUM (PORCINE) 5000 UNIT/ML IJ SOLN
5000.0000 [IU] | Freq: Three times a day (TID) | INTRAMUSCULAR | Status: DC
  Administered 2016-03-15 – 2016-03-18 (×8): 5000 [IU] via SUBCUTANEOUS
  Filled 2016-03-15 (×8): qty 1

## 2016-03-15 MED ORDER — CHOLECALCIFEROL 10 MCG (400 UNIT) PO TABS
400.0000 [IU] | ORAL_TABLET | Freq: Two times a day (BID) | ORAL | Status: DC
  Administered 2016-03-16 – 2016-03-18 (×5): 400 [IU] via ORAL
  Filled 2016-03-15 (×6): qty 1

## 2016-03-15 NOTE — Progress Notes (Signed)
Pt refused bed alarm on. Will continue hourly rounding. 

## 2016-03-15 NOTE — H&P (Signed)
Referring Physician:  [name] is an 82 y.o. male.                       Chief Complaint: Shortness of breath  HPI: 82 year old male with CKD, COPD, GERD and hyertension has 1 week history of weight gain, leg edema and shortness of breath.   Past Medical History:  Diagnosis Date  . Chronic kidney disease   . COPD (chronic obstructive pulmonary disease) ([place])   . GERD (gastroesophageal reflux disease)   . Hypertension   . Respiratory insufficiency       No past surgical history on file.  No family history on file. Social History:  reports that he quit smoking about 2 years ago. His smoking use included Cigarettes. He has a 35.00 pack-year smoking history. He has never used smokeless tobacco. He reports that he does not drink alcohol or use drugs.  Allergies:  Allergies  Allergen Reactions  . Other     Oxygen Sensitive  . Penicillins Rash    Any -illins Has patient had a PCN reaction causing immediate rash, facial/tongue/throat swelling, SOB or lightheadedness with hypotension: Yes Has patient had a PCN reaction causing severe rash involving mucus membranes or skin necrosis: Yes Has patient had a PCN reaction that required hospitalization No Has patient had a PCN reaction occurring within the last 10 years: No If all of the above answers are "NO", then may proceed with Cephalosporin use.   . Vitamin B12 Itching and Rash    injections    Medications Prior to Admission  Medication Sig Dispense Refill  . albuterol (PROVENTIL HFA;VENTOLIN HFA) 108 (90 Base) MCG/ACT inhaler Inhale 1-2 puffs into the lungs every 6 (six) hours as needed for wheezing or shortness of breath.    [name] albuterol (PROVENTIL) (2.5 MG/3ML) 0.083% nebulizer solution Take 3 mLs (2.5 mg total) by nebulization every 6 (six) hours as needed for wheezing or shortness of breath. Reported on 07/14/2015 75 mL 5  . amLODipine (NORVASC) 5 MG tablet Take 1 tablet (5 mg total) by mouth daily. 30 tablet 6  .  budesonide-formoterol (SYMBICORT) 160-4.5 MCG/ACT inhaler Inhale 2 puffs into the lungs 2 (two) times daily.    . cholecalciferol (VITAMIN D) 400 UNITS TABS tablet Take 400 Units by mouth 2 (two) times daily.    . Emollient (AQUAPHOR EX) Apply 1 application topically daily as needed (dry skin. (hands)).    . furosemide (LASIX) 20 MG tablet Take 1 tablet (20 mg total) by mouth daily. (Patient taking differently: Take 80 mg by mouth daily. ) 30 tablet 6  . predniSONE (DELTASONE) 10 MG tablet Take 1 tablet (10 mg total) by mouth daily with breakfast. 30 tablet 5  . tiotropium (SPIRIVA) 18 MCG inhalation capsule Place 18 mcg into inhaler and inhale daily.      Results for orders placed or performed during the hospital encounter of 03/15/16 (from the past 48 hour(s))  Basic metabolic panel     Status: Abnormal   Collection Time: 03/15/16  4:00 PM  Result Value Ref Range   Sodium 138 135 - 145 mmol/L   Potassium 3.6 3.5 - 5.1 mmol/L   Chloride 99 (L) 101 - 111 mmol/L   CO2 24 22 - 32 mmol/L   Glucose, Bld 117 (H) 65 - 99 mg/dL   BUN 31 (H) 6 - 20 mg/dL   Creatinine, Ser 2.36 (H) 0.61 - 1.24 mg/dL   Calcium 9.8 8.9 - 10.3  mg/dL   GFR calc non Af Amer 23 (L) >60 mL/min   GFR calc Af Amer 27 (L) >60 mL/min    Comment: (NOTE) The eGFR has been calculated using the CKD EPI equation. This calculation has not been validated in all clinical situations. eGFR's persistently <60 mL/min signify possible Chronic Kidney Disease.    Anion gap 15 5 - 15  CBC     Status: Abnormal   Collection Time: 03/15/16  4:00 PM  Result Value Ref Range   WBC 14.2 (H) 4.0 - 10.5 K/uL   RBC 5.05 4.22 - 5.81 MIL/uL   Hemoglobin 13.9 13.0 - 17.0 g/dL   HCT 43.7 39.0 - 52.0 %   MCV 86.5 78.0 - 100.0 fL   MCH 27.5 26.0 - 34.0 pg   MCHC 31.8 30.0 - 36.0 g/dL   RDW 16.1 (H) 11.5 - 15.5 %   Platelets 249 150 - 400 K/uL  I-Stat Troponin, ED (not at [facility])     Status: None   Collection Time: 03/15/16  4:21 PM  Result  Value Ref Range   Troponin i, poc 0.02 0.00 - 0.08 ng/mL   Comment 3            Comment: Due to the release kinetics of cTnI, a negative result within the first hours of the onset of symptoms does not rule out myocardial infarction with certainty. If myocardial infarction is still suspected, repeat the test at appropriate intervals.    No results found.  Review Of Systems   Blood pressure (!) 152/83, pulse 94, temperature 97.5 F (36.4 C), temperature source Oral, resp. rate 20, height _0  (1.727 m), weight 89 kg (196 lb 3.2 oz), SpO2 96 %. General appearance: alert, cooperative, appears stated age and moderate distress. Head: Normocephalic, without obvious abnormality, atraumatic. Eyes: Brown eyes, Conjunctivae-pink, Corneas clear. PERRL, EOM's intact.  Neck: No adenopathy, no carotid bruit, + JVD, supple, symmetrical, trachea midline and thyroid not enlarged. Resp: Basal crackles, bilaterally Cardio: Regular rate and rhythm, S1, S2 normal, III/VI systolic murmur. No click, rub or gallop. Abdomen: Distended with dullness over lower abdomen. Bowel sounds are normal. Extremities: No cyanosis. 1 + edema Skin: Warm and dry. No rashes or lesions Neurologic: Alert and oriented X 3, normal strength and tone. Normal coordination and slow gait.  Assessment/Plan Acute on chronic left heart systolic failure End stage COPD Hypertension CKD GERD R/O ascites  Admit/IV lasix/Breathing Tx/Oxygen/R/O MI   Birdie Riddle, MD  03/15/2016, 6:54 PM

## 2016-03-15 NOTE — ED Notes (Signed)
Pt's sister states Dr. Algie CofferKadakia states pt to possible have Ascites and needing for draining of the abdomen.  Pt also states they were going to try extra Lasix this weekend and admit pt if symptoms did not improve over the weekend.

## 2016-03-15 NOTE — ED Triage Notes (Signed)
Pt presents to ED for assessment of increased SOB.  Pt has a hx of COPD, and was leaving Dr. Roseanne KaufmanKadakia's (cardiology) office and had a severe episode of SOB where pt had to lean on wall to catch breath.  MD assessed and asked patient to come to ED.  Pt c/o of increased SOB with effort.    Dr. Algie CofferKadakia is asking to be notified of pt's arrival.  [phone]

## 2016-03-15 NOTE — Progress Notes (Signed)
New pt admission, direct admit. Pt brought to the floor in stable condition. Vitals taken. Initial Assessment done. All immediate pertinent needs to patient addressed. Patient Guide given to patient. Important safety instructions relating to hospitalization reviewed with patient. Patient verbalized understanding. Will continue to monitor pt.  Wynona NeatJessica Enaya Howze RN

## 2016-03-15 NOTE — ED Notes (Signed)
Spoke to Dr. Algie CofferKadakia, informed of pt's arrival.

## 2016-03-15 NOTE — ED Notes (Signed)
Pt supposed to be straight admission  Doctor came to take him to admitting

## 2016-03-15 NOTE — Progress Notes (Signed)
Pt's troponin is 0.07. Pt denied chest pain at this time, resting in bed comfortably, Dr. Algie Cofferkadakia made aware and came to see the patient, troponin to be check again in 6 hours. Will continue to monitor.

## 2016-03-16 ENCOUNTER — Encounter (HOSPITAL_COMMUNITY): Payer: Self-pay | Admitting: *Deleted

## 2016-03-16 LAB — BASIC METABOLIC PANEL
Anion gap: 10 (ref 5–15)
BUN: 28 mg/dL — AB (ref 6–20)
CALCIUM: 9.4 mg/dL (ref 8.9–10.3)
CHLORIDE: 98 mmol/L — AB (ref 101–111)
CO2: 29 mmol/L (ref 22–32)
Creatinine, Ser: 2.41 mg/dL — ABNORMAL HIGH (ref 0.61–1.24)
GFR calc Af Amer: 26 mL/min — ABNORMAL LOW (ref >60)
GFR, EST NON AFRICAN AMERICAN: 23 mL/min — AB (ref >60)
GLUCOSE: 122 mg/dL — AB (ref 65–99)
POTASSIUM: 3.3 mmol/L — AB (ref 3.5–5.1)
Sodium: 137 mmol/L (ref 135–145)

## 2016-03-16 LAB — TROPONIN I
Troponin I: <0.03 ng/mL (ref <0.03)
Troponin I: 0.03 ng/mL (ref <0.03)

## 2016-03-16 NOTE — Progress Notes (Signed)
CRITICAL VALUE ALERT  Critical value received: troponin 0.03 Date of notification:  03/16/2016  Time of notification:  0901  Critical value read back:yes  Nurse who received alert: Clorene Nerio Tamsen Snidermaria Lukka Black RN  MD notified (1st page):  Dr .Algie CofferKadakia  Time of first page:  0902  MD notified (2nd page):  Time of second page:  Responding MD:  Dr. Algie CofferKadakia  Time MD responded:  1219

## 2016-03-16 NOTE — Progress Notes (Signed)
Ref: Greg Jones, [name], MD   Subjective:  Breathing better. Afebrile. CXR shows mild vascular congestion and emphysema. Ultrasound of abdomen is negative for ascites. BNP is normal.  Objective:  Vital Signs in the last 24 hours: Temp:  [97.3 F (36.3 C)-98.7 F (37.1 C)] 98.3 F (36.8 C) (09/28 0732) Pulse Rate:  [55-97] 79 (09/28 0732) Cardiac Rhythm: Normal sinus rhythm (09/28 0700) Resp:  [16-24] 16 (09/28 0732) BP: (143-155)/(80-97) 153/80 (09/28 0732) SpO2:  [92 %-97 %] 96 % (09/28 0732) Weight:  [88.2 kg (194 lb 8 oz)-89 kg (196 lb 3.2 oz)] 88.2 kg (194 lb 8 oz) (09/28 0549)  Physical Exam: BP Readings from Last 1 Encounters:  03/16/16 (!) 153/80    Wt Readings from Last 1 Encounters:  03/16/16 88.2 kg (194 lb 8 oz)    Weight change:   HEENT: [place]/AT, Eyes-Brown, PERL, EOMI, Conjunctiva-Pink, Sclera-Non-icteric Neck: No JVD, No bruit, Trachea midline. Lungs:  Clearing, Bilateral. Cardiac:  Regular rhythm, normal S1 and S2, no S3. III/VI systolic murmur.  Abdomen:  Soft, non-tender. Extremities:  No edema present. No cyanosis. No clubbing. CNS: AxOx3, Cranial nerves grossly intact, moves all 4 extremities.  Skin: Warm and dry.   Intake/Output from previous day: 09/27 0701 - 09/28 0700 In: 480 [P.O.:480] Out: 1175 [Urine:1175]    Lab Results: BMET    Component Value Date/Time   NA 138 03/15/2016 1600   NA 137 09/14/2015 0517   NA 137 09/13/2015 0525   K 3.6 03/15/2016 1600   K 4.6 09/14/2015 0517   K 4.6 09/13/2015 0525   CL 99 (L) 03/15/2016 1600   CL 99 (L) 09/14/2015 0517   CL 97 (L) 09/13/2015 0525   CO2 24 03/15/2016 1600   CO2 29 09/14/2015 0517   CO2 28 09/13/2015 0525   GLUCOSE 117 (H) 03/15/2016 1600   GLUCOSE 138 (H) 09/14/2015 0517   GLUCOSE 200 (H) 09/13/2015 0525   BUN 31 (H) 03/15/2016 1600   BUN 43 (H) 09/14/2015 0517   BUN 48 (H) 09/13/2015 0525   CREATININE 2.36 (H) 03/15/2016 1600   CREATININE 2.11 (H) 09/14/2015 0517   CREATININE  2.45 (H) 09/13/2015 0525   CALCIUM 9.8 03/15/2016 1600   CALCIUM 8.8 (L) 09/14/2015 0517   CALCIUM 8.9 09/13/2015 0525   GFRNONAA 23 (L) 03/15/2016 1600   GFRNONAA 27 (L) 09/14/2015 0517   GFRNONAA 22 (L) 09/13/2015 0525   GFRAA 27 (L) 03/15/2016 1600   GFRAA 31 (L) 09/14/2015 0517   GFRAA 26 (L) 09/13/2015 0525   CBC    Component Value Date/Time   WBC 14.2 (H) 03/15/2016 1600   RBC 5.05 03/15/2016 1600   HGB 13.9 03/15/2016 1600   HCT 43.7 03/15/2016 1600   PLT 249 03/15/2016 1600   MCV 86.5 03/15/2016 1600   MCH 27.5 03/15/2016 1600   MCHC 31.8 03/15/2016 1600   RDW 16.1 (H) 03/15/2016 1600   LYMPHSABS 0.8 09/07/2015 1713   MONOABS 0.3 09/07/2015 1713   EOSABS 0.1 09/07/2015 1713   BASOSABS 0.0 09/07/2015 1713   HEPATIC Function Panel  Recent Labs  05/10/15 1640 09/07/15 1713  PROT 8.2 7.8   HEMOGLOBIN A1C No components found for: HGA1C,  MPG CARDIAC ENZYMES Lab Results  Component Value Date   TROPONINI <0.03 03/16/2016   TROPONINI 0.07 (HH) 03/15/2016   TROPONINI <0.03 09/07/2015   BNP No results for input(s): PROBNP in the last 8760 hours. TSH No results for input(s): TSH in the last 8760 hours. CHOLESTEROL  No results for input(s): CHOL in the last 8760 hours.  Scheduled Meds: . amLODipine  5 mg Oral Daily  . cholecalciferol  400 Units Oral BID  . furosemide  40 mg Intravenous Q12H  . heparin  5,000 Units Subcutaneous Q8H  . mometasone-formoterol  2 puff Inhalation BID  . predniSONE  10 mg Oral Q breakfast  . sodium chloride flush  3 mL Intravenous Q12H  . 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

## 2016-03-17 ENCOUNTER — Other Ambulatory Visit (HOSPITAL_COMMUNITY): Payer: Medicare Other

## 2016-03-17 LAB — BASIC METABOLIC PANEL
Anion gap: 10 (ref 5–15)
BUN: 33 mg/dL — ABNORMAL HIGH (ref 6–20)
CHLORIDE: 99 mmol/L — AB (ref 101–111)
CO2: 28 mmol/L (ref 22–32)
CREATININE: 2.46 mg/dL — AB (ref 0.61–1.24)
Calcium: 9.3 mg/dL (ref 8.9–10.3)
GFR calc non Af Amer: 22 mL/min — ABNORMAL LOW (ref >60)
GFR, EST AFRICAN AMERICAN: 26 mL/min — AB (ref >60)
Glucose, Bld: 115 mg/dL — ABNORMAL HIGH (ref 65–99)
POTASSIUM: 3.3 mmol/L — AB (ref 3.5–5.1)
Sodium: 137 mmol/L (ref 135–145)

## 2016-03-17 MED ORDER — ALBUTEROL SULFATE (2.5 MG/3ML) 0.083% IN NEBU
INHALATION_SOLUTION | RESPIRATORY_TRACT | Status: AC
  Filled 2016-03-17: qty 3

## 2016-03-17 MED ORDER — IPRATROPIUM-ALBUTEROL 0.5-2.5 (3) MG/3ML IN SOLN
3.0000 mL | Freq: Three times a day (TID) | RESPIRATORY_TRACT | Status: DC
  Administered 2016-03-17 – 2016-03-18 (×2): 3 mL via RESPIRATORY_TRACT
  Filled 2016-03-17 (×2): qty 3

## 2016-03-17 MED ORDER — ALBUTEROL SULFATE (2.5 MG/3ML) 0.083% IN NEBU
2.5000 mg | INHALATION_SOLUTION | RESPIRATORY_TRACT | Status: DC | PRN
  Administered 2016-03-17: 2.5 mg via RESPIRATORY_TRACT

## 2016-03-17 MED ORDER — POTASSIUM CHLORIDE ER 10 MEQ PO TBCR
20.0000 meq | EXTENDED_RELEASE_TABLET | Freq: Two times a day (BID) | ORAL | Status: AC
  Administered 2016-03-17 (×2): 20 meq via ORAL
  Filled 2016-03-17 (×4): qty 2

## 2016-03-17 NOTE — Progress Notes (Signed)
1720 breathiing easier. Expiratory wheezing minimal post resp tt rendered

## 2016-03-17 NOTE — Progress Notes (Signed)
1640 Pt remarked of SOB .O2sat 97% on room air /Pt with audible expiratory wheezing . Dr Algie CofferKadakia ordered respiratory tratment

## 2016-03-17 NOTE — Care Management Note (Signed)
Case Management Note  Patient Details  Name: Greg RobertsJonathan Jones MRN: [id number] Date of Birth: 10/07/1927  Subjective/Objective:      Admitted with CHF              Action/Plan: Patient recently moved to [place] from [place] after his spouse died and now he is living with his sister. He is not familiar with the area therefore his sister takes him to his apts and other errands. [place] [facility] [place] [employer]); private insurance with [employer] with prescription drug coverage; [facility]; patient reports no problem getting his medication. He weigh himself daily and his sister cooks a heart health diet with low sodium; DME - rollater ( walker with seat); No needs identified. CM will continue to follow for DCP.  Expected Discharge Date:    possibly 03/18/2016              Expected Discharge Plan:  Home/Self Care  Discharge planning Services  CM Consult  Status of Service:  In process, will continue to follow  Cherrie DistanceChandler, Epifania Littrell L, RN 03/17/2016, 11:17 AM

## 2016-03-17 NOTE — Progress Notes (Signed)
Ref: Augustine RadarOBERSON, [name], MD   Subjective:  Breathing improving. Additional worsening of renal function due to lasix use.   Objective:  Vital Signs in the last 24 hours: Temp:  [97.4 F (36.3 C)-98 F (36.7 C)] 97.5 F (36.4 C) (09/29 0431) Pulse Rate:  [71-87] 71 (09/29 0431) Cardiac Rhythm: Normal sinus rhythm (09/29 0726) Resp:  [18-19] 19 (09/29 0431) BP: (149-164)/(74-80) 153/74 (09/29 0431) SpO2:  [94 %-98 %] 98 % (09/29 0842) Weight:  [88.1 kg (194 lb 3.2 oz)] 88.1 kg (194 lb 3.2 oz) (09/29 0431)  Physical Exam: BP Readings from Last 1 Encounters:  03/17/16 (!) 153/74    Wt Readings from Last 1 Encounters:  03/17/16 88.1 kg (194 lb 3.2 oz)    Weight change: -0.907 kg (-2 lb)  HEENT: [place]/AT, Eyes-Brown, PERL, EOMI, Conjunctiva-Pink, Sclera-Non-icteric Neck: No JVD, No bruit, Trachea midline. Lungs:  Clearing, Bilateral. Cardiac:  Regular rhythm, normal S1 and S2, no S3. III/VI systolic murmur. Abdomen:  Soft, non-tender. Extremities:  No edema present. No cyanosis. No clubbing. CNS: AxOx3, Cranial nerves grossly intact, moves all 4 extremities. Right handed. Skin: Warm and dry.   Intake/Output from previous day: 09/28 0701 - 09/29 0700 In: 1180 [P.O.:1180] Out: 2375 [Urine:2375]    Lab Results: BMET    Component Value Date/Time   NA 137 03/17/2016 0437   NA 137 03/16/2016 0746   NA 138 03/15/2016 1600   K 3.3 (L) 03/17/2016 0437   K 3.3 (L) 03/16/2016 0746   K 3.6 03/15/2016 1600   CL 99 (L) 03/17/2016 0437   CL 98 (L) 03/16/2016 0746   CL 99 (L) 03/15/2016 1600   CO2 28 03/17/2016 0437   CO2 29 03/16/2016 0746   CO2 24 03/15/2016 1600   GLUCOSE 115 (H) 03/17/2016 0437   GLUCOSE 122 (H) 03/16/2016 0746   GLUCOSE 117 (H) 03/15/2016 1600   BUN 33 (H) 03/17/2016 0437   BUN 28 (H) 03/16/2016 0746   BUN 31 (H) 03/15/2016 1600   CREATININE 2.46 (H) 03/17/2016 0437   CREATININE 2.41 (H) 03/16/2016 0746   CREATININE 2.36 (H) 03/15/2016 1600   CALCIUM 9.3  03/17/2016 0437   CALCIUM 9.4 03/16/2016 0746   CALCIUM 9.8 03/15/2016 1600   GFRNONAA 22 (L) 03/17/2016 0437   GFRNONAA 23 (L) 03/16/2016 0746   GFRNONAA 23 (L) 03/15/2016 1600   GFRAA 26 (L) 03/17/2016 0437   GFRAA 26 (L) 03/16/2016 0746   GFRAA 27 (L) 03/15/2016 1600   CBC    Component Value Date/Time   WBC 14.2 (H) 03/15/2016 1600   RBC 5.05 03/15/2016 1600   HGB 13.9 03/15/2016 1600   HCT 43.7 03/15/2016 1600   PLT 249 03/15/2016 1600   MCV 86.5 03/15/2016 1600   MCH 27.5 03/15/2016 1600   MCHC 31.8 03/15/2016 1600   RDW 16.1 (H) 03/15/2016 1600   LYMPHSABS 0.8 09/07/2015 1713   MONOABS 0.3 09/07/2015 1713   EOSABS 0.1 09/07/2015 1713   BASOSABS 0.0 09/07/2015 1713   HEPATIC Function Panel  Recent Labs  05/10/15 1640 09/07/15 1713  PROT 8.2 7.8   HEMOGLOBIN A1C No components found for: HGA1C,  MPG CARDIAC ENZYMES Lab Results  Component Value Date   TROPONINI 0.03 (HH) 03/16/2016   TROPONINI <0.03 03/16/2016   TROPONINI 0.07 (HH) 03/15/2016   BNP No results for input(s): PROBNP in the last 8760 hours. TSH No results for input(s): TSH in the last 8760 hours. CHOLESTEROL No results for input(s): CHOL in the last  8760 hours.  Scheduled Meds: . amLODipine  5 mg Oral Daily  . cholecalciferol  400 Units Oral BID  . heparin  5,000 Units Subcutaneous Q8H  . mometasone-formoterol  2 puff Inhalation BID  . potassium chloride  20 mEq Oral BID  . predniSONE  10 mg Oral Q breakfast  . sodium chloride flush  3 mL Intravenous Q12H  . tiotropium  18 mcg Inhalation Daily   Continuous Infusions:  PRN Meds:.sodium chloride, acetaminophen, albuterol, mineral oil-hydrophilic petrolatum, ondansetron (ZOFRAN) IV, sodium chloride flush  Assessment/Plan: End stage COPD Hypertension CKD, IV GERD Obesity Hypokalemia  Awaiting echocardiogram. DC IV lasix. Potassium supplement   LOS: 2 days    Orpah Cobb  MD  03/17/2016, 10:48 AM

## 2016-03-18 ENCOUNTER — Inpatient Hospital Stay (HOSPITAL_COMMUNITY): Payer: Medicare Other

## 2016-03-18 LAB — BASIC METABOLIC PANEL
ANION GAP: 8 (ref 5–15)
BUN: 32 mg/dL — ABNORMAL HIGH (ref 6–20)
CHLORIDE: 100 mmol/L — AB (ref 101–111)
CO2: 30 mmol/L (ref 22–32)
Calcium: 9.3 mg/dL (ref 8.9–10.3)
Creatinine, Ser: 2.27 mg/dL — ABNORMAL HIGH (ref 0.61–1.24)
GFR calc non Af Amer: 24 mL/min — ABNORMAL LOW (ref >60)
GFR, EST AFRICAN AMERICAN: 28 mL/min — AB (ref >60)
Glucose, Bld: 122 mg/dL — ABNORMAL HIGH (ref 65–99)
Potassium: 3.9 mmol/L (ref 3.5–5.1)
SODIUM: 138 mmol/L (ref 135–145)

## 2016-03-18 MED ORDER — FUROSEMIDE 20 MG PO TABS: 80.0000 mg | ORAL_TABLET | Freq: Every day | ORAL | 3 refills | Status: DC

## 2016-03-18 NOTE — Progress Notes (Signed)
Patient alert and oriented, denies pain, no shortness of breath, v/s stable . Will continue to monitor the patient.

## 2016-03-18 NOTE — Progress Notes (Signed)
  Echocardiogram 2D Echocardiogram has been performed.  Leta JunglingCooper, Aldridge Krzyzanowski M 03/18/2016, 11:23 AM

## 2016-03-18 NOTE — Progress Notes (Signed)
Patient alert and oriented, denies pain, no shortness of breath, v/s stable. D/c instruction explain and given to the patient and family, all questions answer, patient verbalized understanding, Iv and tele d/c. Patient d/c home per order.

## 2016-03-18 NOTE — Progress Notes (Signed)
Physical Therapy Evaluation & Discharge Patient Details Name: Greg Jones MRN: [id number] DOB: 1928/05/10 Today's Date: 03/18/2016   History of Present Illness  82 year old male with CKD, COPD, GERD and hyertension has 1 week history of weight gain, leg edema and shortness of breath.  Dx: CHF  Clinical Impression  Patient reports his normal mobility is short distances with rest intervals in community, but had been limited to 20 feet prior to hospital.  Demonstrated 50-100 feet ambulation to fatigue today, uses walker for energy conservation > balance support, and requires 2-4 minutes recovery intervals.  O2 remains in good range after activity, though patient wheezes during recovery.  Educated patient about activity dosing, patient states he wants to continue the rehab he was doing here at the hospital (cardiac rehab?) on discharge.  Patient without further acute PT needs noted.  May DC when medically ready.    Follow Up Recommendations Other (comment) (Was doing rehab as outpatient in hospital, can resume.)    Equipment Recommendations  None recommended by PT    Recommendations for Other Services       Precautions / Restrictions Precautions Precautions: None Restrictions Weight Bearing Restrictions: No      Mobility  Bed Mobility Overal bed mobility: Independent                Transfers Overall transfer level: Independent Equipment used: Rolling walker (2 wheeled)                Ambulation/Gait Ambulation/Gait assistance: Modified independent (Device/Increase time) Ambulation Distance (Feet):  (1 x 50 feet, 1 x 100 feet.) Assistive device: Rolling walker (2 wheeled) Gait Pattern/deviations: WFL(Within Functional Limits)   Gait velocity interpretation: Below normal speed for age/gender General Gait Details: uses walker for energy support > balance support  Stairs            Wheelchair Mobility    Modified Rankin (Stroke Patients Only)        Balance Overall balance assessment: Modified Independent;No apparent balance deficits (not formally assessed)                                           Pertinent Vitals/Pain Pain Assessment: No/denies pain    Home Living Family/patient expects to be discharged to:: Private residence Living Arrangements: Other relatives (Sister) Available Help at Discharge: Family Type of Home: House Home Access: Level entry     Home Layout: One level Home Equipment: Environmental consultant - 2 wheels;Walker - 4 wheels      Prior Function Level of Independence: Independent with assistive device(s)         Comments: Can walk around home without device, uses device for energy support     Hand Dominance        Extremity/Trunk Assessment   Upper Extremity Assessment: Overall WFL for tasks assessed           Lower Extremity Assessment: Overall WFL for tasks assessed         Communication   Communication: No difficulties  Cognition Arousal/Alertness: Awake/alert Behavior During Therapy: WFL for tasks assessed/performed Overall Cognitive Status: Within Functional Limits for tasks assessed                      General Comments General comments (skin integrity, edema, etc.): Requires 2-3 minutes to recover from exertion    Exercises  Assessment/Plan    PT Assessment All further PT needs can be met in the next venue of care  PT Problem List Decreased strength;Decreased activity tolerance;Cardiopulmonary status limiting activity          PT Treatment Interventions      PT Goals (Current goals can be found in the Care Plan section)  Acute Rehab PT Goals Patient Stated Goal: Get back home.    Frequency     Barriers to discharge        Co-evaluation               End of Session Equipment Utilized During Treatment: Gait belt Activity Tolerance: Patient tolerated treatment well Patient left: in bed;with call bell/phone within reach Nurse  Communication: Mobility status         Time: 3475-8307 PT Time Calculation (min) (ACUTE ONLY): 30 min   Charges:   PT Evaluation $PT Eval Low Complexity: 1 Procedure PT Treatments $Gait Training: 8-22 mins   PT G Codes:        Vidalia Serpas L 04/10/2016, 10:01 AM

## 2016-03-18 NOTE — Discharge Summary (Signed)
Physician Discharge Summary  Patient ID: Greg Jones MRN: [id number] DOB/AGE: 09/22/1927 82 y.o.  Admit date: 03/15/2016 Discharge date: 03/18/2016  Admission Diagnoses: Acute on chronic left heart systolic failure End stage COPD Hypertension CKD, III GERD R/O ascites  Discharge Diagnoses:  Principal Problem:   *Acute on chronic systolic and diastolic heart failure (HCC)* Active Problems:   COPD (chronic obstructive pulmonary disease) (HCC)   Essential hypertension   CKD, III   GERD   Obesity   Discharged Condition: fair  Hospital Course: 82 years old male with CKD, COPD, Obesity, GERD and hypertension had 1 week history of weight gain, leg edema and shortness of breath. He improved with IV lasix use. His echocardiogram showed mild systolic and diastolic dysfunction with 45% EF. He will see me in 1 week and primary care in 2 weeks.  Consults: cardiology  Significant Diagnostic Studies: Mildly elevated WBC count form chronic prednisone use. Normal Hgb and platelets. Normal BMET except BUN-31 and Creatinine-2.36. BNP and Troponin I were unremarkable.  EKG-NSR, PACs and lateral wall ischemia.  Echocardiogram: Mild LV systolic and diastolic dysfunction.  Treatments: cardiac meds: furosemide and pulmonary medications: Symbicort, Spiriva and albuterol MDI and nebulizer.  Discharge Exam: Blood pressure 130/84, pulse 92, temperature 97.7 F (36.5 C), temperature source Oral, resp. rate 18, height 5\' 8"  (1.727 m), weight 88.1 kg (194 lb 3.6 oz), SpO2 98 %.  General appearance: alert, cooperative, appears stated age and mild distress Head: Normocephalic,atraumatic. Somewhat moon face. Eyes: Brown eyes, pink conjunctivae, corneas clear. PERRL, EOM's intact.  Neck: No adenopathy, no carotid bruit, no JVD, supple, symmetrical, trachea midline and thyroid not enlarged. Resp: Clearing to auscultation bilaterally. Cardio: Regular rate and rhythm, S1, S2 normal, no click, rub or  gallop. II/VI systolic murmur. GI: soft, non-tender; bowel sounds normal; no masses,  no organomegaly Extremities: extremities normal, atraumatic, no cyanosis or edema Skin: Warm and dry. Neurologic: Alert and oriented X 3, normal strength and tone. Normal coordination and gait  Disposition: 01-Home or Self Care     Medication List    TAKE these medications   albuterol 108 (90 Base) MCG/ACT inhaler Commonly known as:  PROVENTIL HFA;VENTOLIN HFA Inhale 1-2 puffs into the lungs every 6 (six) hours as needed for wheezing or shortness of breath.   albuterol (2.5 MG/3ML) 0.083% nebulizer solution Commonly known as:  PROVENTIL Take 3 mLs (2.5 mg total) by nebulization every 6 (six) hours as needed for wheezing or shortness of breath. Reported on 07/14/2015   amLODipine 5 MG tablet Commonly known as:  NORVASC Take 1 tablet (5 mg total) by mouth daily.   AQUAPHOR EX Apply 1 application topically daily as needed (dry skin. (hands)).   budesonide-formoterol 160-4.5 MCG/ACT inhaler Commonly known as:  SYMBICORT Inhale 2 puffs into the lungs 2 (two) times daily.   cholecalciferol 400 units Tabs tablet Commonly known as:  VITAMIN D Take 400 Units by mouth daily.   furosemide 20 MG tablet Commonly known as:  LASIX Take 4 tablets (80 mg total) by mouth daily.   predniSONE 10 MG tablet Commonly known as:  DELTASONE Take 1 tablet (10 mg total) by mouth daily with breakfast.   tiotropium 18 MCG inhalation capsule Commonly known as:  SPIRIVA Place 18 mcg into inhaler and inhale daily.      Follow-up Information    Augustine Radar, MD. Schedule an appointment as soon as possible for a visit in 2 week(s).   Specialty:  Nurse Practitioner Contact information: [address] [place] [address] [phone]        [facility] Center[facility]ADAKIA,Lataunya Ruud S, MD. Schedule an appointment as soon as possible for a visit in 1 week(s).   Specialty:  Cardiology Contact information: [address] [place] [address] [phone]           Signed: Ricki RodriguezKADAKIA,Krysteena Stalker S 03/18/2016, 11:33 AM

## 2016-03-24 LAB — ECHOCARDIOGRAM COMPLETE
HEIGHTINCHES: 68 in
Weight: 3107.6 oz

## 2016-04-11 ENCOUNTER — Emergency Department (HOSPITAL_COMMUNITY): Payer: Medicare Other

## 2016-04-11 ENCOUNTER — Encounter (HOSPITAL_COMMUNITY): Payer: Self-pay | Admitting: Emergency Medicine

## 2016-04-11 ENCOUNTER — Observation Stay (HOSPITAL_COMMUNITY)
Admission: EM | Admit: 2016-04-11 | Discharge: 2016-04-12 | Disposition: A | Discharge status: Home / Self Care | Payer: Medicare Other | Attending: Internal Medicine | Admitting: Internal Medicine

## 2016-04-11 DIAGNOSIS — I5042 Chronic combined systolic (congestive) and diastolic (congestive) heart failure: Secondary | ICD-10-CM | POA: Diagnosis not present

## 2016-04-11 DIAGNOSIS — Z888 Allergy status to other drugs, medicaments and biological substances status: Secondary | ICD-10-CM | POA: Insufficient documentation

## 2016-04-11 DIAGNOSIS — I130 Hypertensive heart and chronic kidney disease with heart failure and stage 1 through stage 4 chronic kidney disease, or unspecified chronic kidney disease: Secondary | ICD-10-CM | POA: Diagnosis not present

## 2016-04-11 DIAGNOSIS — E876 Hypokalemia: Secondary | ICD-10-CM | POA: Diagnosis present

## 2016-04-11 DIAGNOSIS — R739 Hyperglycemia, unspecified: Secondary | ICD-10-CM | POA: Insufficient documentation

## 2016-04-11 DIAGNOSIS — Z7982 Long term (current) use of aspirin: Secondary | ICD-10-CM | POA: Insufficient documentation

## 2016-04-11 DIAGNOSIS — Z880 Allergy status to penicillin: Secondary | ICD-10-CM | POA: Insufficient documentation

## 2016-04-11 DIAGNOSIS — D72829 Elevated white blood cell count, unspecified: Secondary | ICD-10-CM | POA: Insufficient documentation

## 2016-04-11 DIAGNOSIS — I951 Orthostatic hypotension: Principal | ICD-10-CM | POA: Insufficient documentation

## 2016-04-11 DIAGNOSIS — Z7952 Long term (current) use of systemic steroids: Secondary | ICD-10-CM | POA: Insufficient documentation

## 2016-04-11 DIAGNOSIS — R9431 Abnormal electrocardiogram [ECG] [EKG]: Secondary | ICD-10-CM

## 2016-04-11 DIAGNOSIS — Z79899 Other long term (current) drug therapy: Secondary | ICD-10-CM | POA: Diagnosis not present

## 2016-04-11 DIAGNOSIS — K219 Gastro-esophageal reflux disease without esophagitis: Secondary | ICD-10-CM | POA: Diagnosis not present

## 2016-04-11 DIAGNOSIS — J438 Other emphysema: Secondary | ICD-10-CM | POA: Diagnosis not present

## 2016-04-11 DIAGNOSIS — Z87891 Personal history of nicotine dependence: Secondary | ICD-10-CM | POA: Diagnosis not present

## 2016-04-11 DIAGNOSIS — N184 Chronic kidney disease, stage 4 (severe): Secondary | ICD-10-CM | POA: Diagnosis not present

## 2016-04-11 DIAGNOSIS — I491 Atrial premature depolarization: Secondary | ICD-10-CM | POA: Diagnosis not present

## 2016-04-11 DIAGNOSIS — I4581 Long QT syndrome: Secondary | ICD-10-CM | POA: Insufficient documentation

## 2016-04-11 DIAGNOSIS — R55 Syncope and collapse: Secondary | ICD-10-CM

## 2016-04-11 DIAGNOSIS — I10 Essential (primary) hypertension: Secondary | ICD-10-CM | POA: Diagnosis present

## 2016-04-11 DIAGNOSIS — J449 Chronic obstructive pulmonary disease, unspecified: Secondary | ICD-10-CM | POA: Insufficient documentation

## 2016-04-11 DIAGNOSIS — L8991 Pressure ulcer of unspecified site, stage 1: Secondary | ICD-10-CM | POA: Insufficient documentation

## 2016-04-11 HISTORY — DX: Heart failure, unspecified: I50.9

## 2016-04-11 LAB — URINALYSIS, ROUTINE W REFLEX MICROSCOPIC
BILIRUBIN URINE: NEGATIVE
GLUCOSE, UA: NEGATIVE mg/dL
HGB URINE DIPSTICK: NEGATIVE
KETONES UR: NEGATIVE mg/dL
Leukocytes, UA: NEGATIVE
NITRITE: NEGATIVE
PH: 6 (ref 5.0–8.0)
Protein, ur: NEGATIVE mg/dL
SPECIFIC GRAVITY, URINE: 1.01 (ref 1.005–1.030)

## 2016-04-11 LAB — BRAIN NATRIURETIC PEPTIDE: B Natriuretic Peptide: 342.2 pg/mL — ABNORMAL HIGH (ref 0.0–100.0)

## 2016-04-11 LAB — COMPREHENSIVE METABOLIC PANEL
ALBUMIN: 4.7 g/dL (ref 3.5–5.0)
ALK PHOS: 58 U/L (ref 38–126)
ALT: 40 U/L (ref 17–63)
ANION GAP: 18 — AB (ref 5–15)
AST: 28 U/L (ref 15–41)
BUN: 60 mg/dL — ABNORMAL HIGH (ref 6–20)
CALCIUM: 10.3 mg/dL (ref 8.9–10.3)
CHLORIDE: 84 mmol/L — AB (ref 101–111)
CO2: 35 mmol/L — AB (ref 22–32)
Creatinine, Ser: 2.99 mg/dL — ABNORMAL HIGH (ref 0.61–1.24)
GFR calc non Af Amer: 18 mL/min — ABNORMAL LOW (ref >60)
GFR, EST AFRICAN AMERICAN: 20 mL/min — AB (ref >60)
GLUCOSE: 184 mg/dL — AB (ref 65–99)
Potassium: 2.5 mmol/L — CL (ref 3.5–5.1)
SODIUM: 137 mmol/L (ref 135–145)
Total Bilirubin: 1.1 mg/dL (ref 0.3–1.2)
Total Protein: 8 g/dL (ref 6.5–8.1)

## 2016-04-11 LAB — CBC WITH DIFFERENTIAL/PLATELET
Basophils Absolute: 0 10*3/uL (ref 0.0–0.1)
Basophils Relative: 0 %
Eosinophils Absolute: 0.1 10*3/uL (ref 0.0–0.7)
Eosinophils Relative: 1 %
HEMATOCRIT: 49.5 % (ref 39.0–52.0)
HEMOGLOBIN: 16.4 g/dL (ref 13.0–17.0)
LYMPHS ABS: 1.9 10*3/uL (ref 0.7–4.0)
LYMPHS PCT: 12 %
MCH: 27.9 pg (ref 26.0–34.0)
MCHC: 33.1 g/dL (ref 30.0–36.0)
MCV: 84.3 fL (ref 78.0–100.0)
MONO ABS: 1 10*3/uL (ref 0.1–1.0)
MONOS PCT: 6 %
NEUTROS ABS: 13.4 10*3/uL — AB (ref 1.7–7.7)
NEUTROS PCT: 81 %
Platelets: 252 10*3/uL (ref 150–400)
RBC: 5.87 MIL/uL — ABNORMAL HIGH (ref 4.22–5.81)
RDW: 16 % — AB (ref 11.5–15.5)
WBC: 16.5 10*3/uL — ABNORMAL HIGH (ref 4.0–10.5)

## 2016-04-11 LAB — I-STAT TROPONIN, ED: Troponin i, poc: 0.04 ng/mL (ref 0.00–0.08)

## 2016-04-11 LAB — MAGNESIUM: Magnesium: 2.4 mg/dL (ref 1.7–2.4)

## 2016-04-11 MED ORDER — SODIUM CHLORIDE 0.9% FLUSH
3.0000 mL | Freq: Two times a day (BID) | INTRAVENOUS | Status: DC
  Administered 2016-04-11: 3 mL via INTRAVENOUS

## 2016-04-11 MED ORDER — HEPARIN SODIUM (PORCINE) 5000 UNIT/ML IJ SOLN
5000.0000 [IU] | Freq: Three times a day (TID) | INTRAMUSCULAR | Status: DC
  Administered 2016-04-11 – 2016-04-12 (×2): 5000 [IU] via SUBCUTANEOUS
  Filled 2016-04-11 (×2): qty 1

## 2016-04-11 MED ORDER — ALBUTEROL SULFATE HFA 108 (90 BASE) MCG/ACT IN AERS: 1.0000 | INHALATION_SPRAY | Freq: Four times a day (QID) | RESPIRATORY_TRACT | Status: DC | PRN

## 2016-04-11 MED ORDER — POTASSIUM CHLORIDE CRYS ER 20 MEQ PO TBCR
40.0000 meq | EXTENDED_RELEASE_TABLET | ORAL | Status: AC
  Administered 2016-04-11 (×2): 40 meq via ORAL
  Filled 2016-04-11 (×2): qty 2

## 2016-04-11 MED ORDER — MAGNESIUM SULFATE 2 GM/50ML IV SOLN
2.0000 g | Freq: Once | INTRAVENOUS | Status: AC
  Administered 2016-04-11: 2 g via INTRAVENOUS
  Filled 2016-04-11: qty 50

## 2016-04-11 MED ORDER — SODIUM CHLORIDE 0.9 % IV BOLUS (SEPSIS)
500.0000 mL | Freq: Once | INTRAVENOUS | Status: AC
  Administered 2016-04-11: 500 mL via INTRAVENOUS

## 2016-04-11 MED ORDER — POTASSIUM CHLORIDE 10 MEQ/100ML IV SOLN
10.0000 meq | Freq: Once | INTRAVENOUS | Status: AC
  Administered 2016-04-11: 10 meq via INTRAVENOUS
  Filled 2016-04-11: qty 100

## 2016-04-11 MED ORDER — ISOSORBIDE MONONITRATE ER 30 MG PO TB24
30.0000 mg | ORAL_TABLET | Freq: Every day | ORAL | Status: DC
  Administered 2016-04-12: 30 mg via ORAL
  Filled 2016-04-11: qty 1

## 2016-04-11 MED ORDER — MOMETASONE FURO-FORMOTEROL FUM 200-5 MCG/ACT IN AERO
2.0000 | INHALATION_SPRAY | Freq: Two times a day (BID) | RESPIRATORY_TRACT | Status: DC
  Administered 2016-04-11 – 2016-04-12 (×2): 2 via RESPIRATORY_TRACT
  Filled 2016-04-11: qty 8.8

## 2016-04-11 MED ORDER — TIOTROPIUM BROMIDE MONOHYDRATE 18 MCG IN CAPS
18.0000 ug | ORAL_CAPSULE | Freq: Every day | RESPIRATORY_TRACT | Status: DC
  Administered 2016-04-12: 18 ug via RESPIRATORY_TRACT
  Filled 2016-04-11: qty 5

## 2016-04-11 MED ORDER — ASPIRIN EC 81 MG PO TBEC
81.0000 mg | DELAYED_RELEASE_TABLET | Freq: Every day | ORAL | Status: DC
  Administered 2016-04-12: 81 mg via ORAL
  Filled 2016-04-11: qty 1

## 2016-04-11 MED ORDER — ALBUTEROL SULFATE (2.5 MG/3ML) 0.083% IN NEBU: 2.5000 mg | INHALATION_SOLUTION | Freq: Four times a day (QID) | RESPIRATORY_TRACT | Status: DC | PRN

## 2016-04-11 MED ORDER — CHOLECALCIFEROL 10 MCG (400 UNIT) PO TABS
400.0000 [IU] | ORAL_TABLET | Freq: Every day | ORAL | Status: DC
  Administered 2016-04-12: 400 [IU] via ORAL
  Filled 2016-04-11: qty 1

## 2016-04-11 MED ORDER — POTASSIUM CHLORIDE CRYS ER 20 MEQ PO TBCR
40.0000 meq | EXTENDED_RELEASE_TABLET | Freq: Once | ORAL | Status: AC
  Administered 2016-04-11: 40 meq via ORAL
  Filled 2016-04-11: qty 2

## 2016-04-11 MED ORDER — PREDNISONE 5 MG PO TABS
10.0000 mg | ORAL_TABLET | Freq: Every day | ORAL | Status: DC
Start: 2016-04-12 — End: 2016-04-12
  Administered 2016-04-12: 10 mg via ORAL
  Filled 2016-04-11: qty 2

## 2016-04-11 MED ORDER — AMLODIPINE BESYLATE 5 MG PO TABS
5.0000 mg | ORAL_TABLET | Freq: Every day | ORAL | Status: DC
  Administered 2016-04-12: 5 mg via ORAL
  Filled 2016-04-11: qty 1

## 2016-04-11 NOTE — ED Notes (Signed)
Pt can go to floor at 18:23.

## 2016-04-11 NOTE — ED Triage Notes (Signed)
Per EMS pt from home states he was walking from his bathroom to his kitchen when felt dizzy for about 15 minutes then it subsided. Pt denies dizziness at present time. Family reports pt has had loss of appetite past couple days and pt states he just is not hungry. Denies any SOB or blurred vision.

## 2016-04-11 NOTE — ED Notes (Signed)
One IV attempt unsuccessful, another RN to attempt next.

## 2016-04-11 NOTE — ED Notes (Signed)
Pt made aware need of UA

## 2016-04-11 NOTE — ED Provider Notes (Signed)
[facility]-EMERGENCY DEPT Provider Note   CSN: [id number] Arrival date & time: 04/11/16  1326     History   Chief Complaint Chief Complaint  Patient presents with  . Dizziness    HPI Jaylun Fleener is a 82 y.o. male.  HPI   Pt with hx CHF, COPD, CKDIII, HTN p/w episode of lightheadedness and near syncope that occurred earlier this afternoon.  States he has had decreased appetite since yesterday, stayed in bed all morning, not feeling the energy to get up.  Once he did get up (12:15pm) and went to kitchen table he sat down, had episode of lightheadedness and near syncope, was unable to communicate well because he couldn't stay completely conscious.  He now feels back to normal.  He recently had extra fluid pill added to his medication regimen.   Denies any recent illness, fever, CP, SOB, cough, pain anywhere, abdominal pain, GU or GI symptoms.    [facility] [place]     Past Medical History:  Diagnosis Date  . CHF (congestive heart failure) (HCC)   . Chronic kidney disease   . COPD (chronic obstructive pulmonary disease) (HCC)   . GERD (gastroesophageal reflux disease)   . Hypertension   . Respiratory insufficiency     Patient Active Problem List   Diagnosis Date Noted  . Near syncope 04/11/2016  . Pre-syncope 04/11/2016  . CKD (chronic kidney disease), stage IV (HCC) 04/11/2016  . QT prolongation 04/11/2016  . Leukocytosis 04/11/2016  . Hypokalemia 04/11/2016  . Blood glucose elevated 04/11/2016  . Acute on chronic left systolic heart failure (HCC) 03/15/2016  . Chronic combined systolic and diastolic CHF (congestive heart failure) (HCC) 01/11/2016  . Dyspnea   . COPD exacerbation (HCC) 09/07/2015  . GERD (gastroesophageal reflux disease) 05/28/2015  . COPD (chronic obstructive pulmonary disease) (HCC) 05/10/2015  . Essential hypertension 05/10/2015    History reviewed. No pertinent surgical history.     Home Medications    Prior to Admission medications     Medication Sig Start Date End Date Taking? Authorizing Provider  albuterol (PROVENTIL HFA;VENTOLIN HFA) 108 (90 Base) MCG/ACT inhaler Inhale 1-2 puffs into the lungs every 6 (six) hours as needed for wheezing or shortness of breath.   Yes Historical Provider, MD  albuterol (PROVENTIL) (2.5 MG/3ML) 0.083% nebulizer solution Take 3 mLs (2.5 mg total) by nebulization every 6 (six) hours as needed for wheezing or shortness of breath. Reported on 07/14/2015 02/15/16  Yes Tammy S Parrett, NP  amLODipine (NORVASC) 5 MG tablet Take 1 tablet (5 mg total) by mouth daily. 09/14/15  Yes Simonne Martinet, NP  aspirin EC 81 MG tablet Take 81 mg by mouth daily.   Yes Historical Provider, MD  budesonide-formoterol (SYMBICORT) 160-4.5 MCG/ACT inhaler Inhale 2 puffs into the lungs 2 (two) times daily.   Yes Historical Provider, MD  cholecalciferol (VITAMIN D) 400 UNITS TABS tablet Take 400 Units by mouth daily.    Yes Historical Provider, MD  Emollient (AQUAPHOR EX) Apply 1 application topically daily as needed (dry skin. (hands)).   Yes Historical Provider, MD  furosemide (LASIX) 20 MG tablet Take 4 tablets (80 mg total) by mouth daily. 03/18/16  Yes Orpah Cobb, MD  isosorbide mononitrate (IMDUR) 30 MG 24 hr tablet Take 30 mg by mouth daily.   Yes Historical Provider, MD  metolazone (ZAROXOLYN) 5 MG tablet Take 5 mg by mouth daily.   Yes Historical Provider, MD  predniSONE (DELTASONE) 10 MG tablet Take 1 tablet (10 mg  total) by mouth daily with breakfast. 11/19/15  Yes Oretha Milch, MD  tiotropium (SPIRIVA) 18 MCG inhalation capsule Place 18 mcg into inhaler and inhale daily.   Yes Historical Provider, MD    Family History Family History  Problem Relation Age of Onset  . Tuberculosis Mother   . Diabetes Father     Social History Social History  Substance Use Topics  . Smoking status: Former Smoker    Packs/day: 0.50    Years: 70.00    Types: Cigarettes    Quit date: 04/19/2013  . Smokeless tobacco: Never  Used  . Alcohol use No     Allergies   Other; Penicillins; and Vitamin b12   Review of Systems Review of Systems  All other systems reviewed and are negative.    Physical Exam Updated Vital Signs BP (!) 153/94 (BP Location: Right Arm)   Pulse (!) 102   Temp 97.9 F (36.6 C) (Oral)   Resp 18   Ht 5\' 7"  (1.702 m)   Wt 83.6 kg   SpO2 94%   BMI 28.87 kg/m   Physical Exam  Constitutional: He appears well-developed and well-nourished. No distress.  HENT:  Head: Normocephalic and atraumatic.  Neck: Neck supple.  Cardiovascular: Normal rate and regular rhythm.   Pulmonary/Chest: Effort normal. No respiratory distress. He has decreased breath sounds. He has no wheezes. He has no rhonchi. He has no rales.  Abdominal: Soft. He exhibits no distension and no mass. There is no tenderness. There is no rebound and no guarding.  Musculoskeletal: He exhibits no edema.  Neurological: He is alert. He exhibits normal muscle tone.  Skin: He is not diaphoretic.  Nursing note and vitals reviewed.    ED Treatments / Results  Labs (all labs ordered are listed, but only abnormal results are displayed) Labs Reviewed  COMPREHENSIVE METABOLIC PANEL - Abnormal; Notable for the following:       Result Value   Potassium 2.5 (*)    Chloride 84 (*)    CO2 35 (*)    Glucose, Bld 184 (*)    BUN 60 (*)    Creatinine, Ser 2.99 (*)    GFR calc non Af Amer 18 (*)    GFR calc Af Amer 20 (*)    Anion gap 18 (*)    All other components within normal limits  CBC WITH DIFFERENTIAL/PLATELET - Abnormal; Notable for the following:    WBC 16.5 (*)    RBC 5.87 (*)    RDW 16.0 (*)    Neutro Abs 13.4 (*)    All other components within normal limits  BRAIN NATRIURETIC PEPTIDE - Abnormal; Notable for the following:    B Natriuretic Peptide 342.2 (*)    All other components within normal limits  URINALYSIS, ROUTINE W REFLEX MICROSCOPIC (NOT AT [facility])  MAGNESIUM  CBC WITH DIFFERENTIAL/PLATELET  BASIC  METABOLIC PANEL  MAGNESIUM  HEMOGLOBIN A1C  TSH  I-STAT TROPOININ, ED    EKG  EKG Interpretation  Date/Time:  Tuesday April 11 2016 13:56:20 EDT Ventricular Rate:  104 PR Interval:    QRS Duration: 83 QT Interval:  402 QTC Calculation: 529 R Axis:   -43 Text Interpretation:  Sinus tachycardia Paired ventricular premature complexes Sinus pause with ventricular escape Aberrant conduction of SV complex(es) Consider right atrial enlargement Left axis deviation Borderline repolarization abnormality Prolonged QT interval Confirmed by [facility] MD, PEDRO ([id number]) on 04/11/2016 2:58:32 PM       Radiology Dg Chest Portable 1  View  Result Date: 04/11/2016 CLINICAL DATA:  Near syncope, lightheadedness EXAM: PORTABLE CHEST 1 VIEW COMPARISON:  03/15/2016 FINDINGS: There is no focal parenchymal opacity. There is no pleural effusion or pneumothorax. The heart and mediastinal contours are unremarkable. The osseous structures are unremarkable. IMPRESSION: No active disease. Electronically Signed   By: Elige KoHetal  Patel   On: 04/11/2016 14:24    Procedures Procedures (including critical care time)  Medications Ordered in ED Medications  albuterol (PROVENTIL) (2.5 MG/3ML) 0.083% nebulizer solution 2.5 mg (not administered)  amLODipine (NORVASC) tablet 5 mg (not administered)  aspirin EC tablet 81 mg (not administered)  mometasone-formoterol (DULERA) 200-5 MCG/ACT inhaler 2 puff (2 puffs Inhalation Given 04/11/16 1947)  cholecalciferol (VITAMIN D) tablet 400 Units (not administered)  isosorbide mononitrate (IMDUR) 24 hr tablet 30 mg (not administered)  predniSONE (DELTASONE) tablet 10 mg (not administered)  tiotropium (SPIRIVA) inhalation capsule 18 mcg (18 mcg Inhalation Not Given 04/11/16 1736)  potassium chloride SA (K-DUR,KLOR-CON) CR tablet 40 mEq (40 mEq Oral Given 04/11/16 1819)  heparin injection 5,000 Units (not administered)  sodium chloride flush (NS) 0.9 % injection 3 mL (not  administered)  sodium chloride 0.9 % bolus 500 mL (0 mLs Intravenous Stopped 04/11/16 1734)  potassium chloride 10 mEq in 100 mL IVPB (0 mEq Intravenous Stopped 04/11/16 1734)  potassium chloride SA (K-DUR,KLOR-CON) CR tablet 40 mEq (40 mEq Oral Given 04/11/16 1634)  magnesium sulfate IVPB 2 g 50 mL (2 g Intravenous Transfusing/Transfer 04/11/16 1822)     Initial Impression / Assessment and Plan / ED Course  I have reviewed the triage vital signs and the nursing notes.  Pertinent labs & imaging results that were available during my care of the patient were reviewed by me and considered in my medical decision making (see chart for details).  Clinical Course    Pt with hx CHF, COPD, CKD recently started on additional diuretic p/w episode of lightheadedness/near-syncope.   +Orthostatic.  +Prolonged QT on EKG +hypokalemia.  Admitted to [facility], Dr Roda ShuttersXu accepting.    Final Clinical Impressions(s) / ED Diagnoses   Final diagnoses:  Orthostatic hypotension  Prolonged Q-T interval on ECG  Near syncope  Hypokalemia    New Prescriptions Current Discharge Medication List       Trixie Dredgemily Dellas Guard, [place] JerseyPA-C 04/11/16 2056

## 2016-04-11 NOTE — ED Provider Notes (Signed)
Medical screening examination/treatment/procedure(s) were conducted as a shared visit with non-physician practitioner(s) and myself.  I personally evaluated the patient during the encounter. Briefly, the patient is a 82 y.o. male presents with a near-syncopal episode in the setting of new diuretic. Patient is orthostatic requiring IV fluids. Patient does have a history of congestive heart failure with an EKG revealing new prolonged QTC. Patient will require admission for close monitoring and rehydration.    EKG Interpretation  Date/Time:  Tuesday April 11 2016 13:56:20 EDT Ventricular Rate:  104 PR Interval:    QRS Duration: 83 QT Interval:  402 QTC Calculation: 529 R Axis:   -43 Text Interpretation:  Sinus tachycardia Paired ventricular premature complexes Sinus pause with ventricular escape Aberrant conduction of SV complex(es) Consider right atrial enlargement Left axis deviation Borderline repolarization abnormality Prolonged QT interval Confirmed by [facility], PEDRO ([id number]) on 04/11/2016 2:58:32 PM           Nira ConnPedro Eduardo Cardama, MD 04/11/16 [id number]

## 2016-04-11 NOTE — ED Notes (Signed)
Bed: WA22 Expected date:  Expected time:  Means of arrival:  Comments: EMS-dizziness 

## 2016-04-11 NOTE — H&P (Signed)
History and Physical  Greg Jones ZOX:[id number] DOB: 1928/01/05 DOA: 04/11/2016  Referring physician: EDP PCP: Augustine Radar, MD   Chief Complaint: weak, dizziness  HPI: Greg Jones is a 82 y.o. male   With h/o combined chf (last lvef in 02/2016 45-50%, grade I diastolic dysfunction), h/o copd, prednisone dependent, but not on home o2, h/o HTN presented to [facility] long ED due to above complaints, he states he has had decreased appetite since yesterday, stayed in bed all morning, not feeling the energy to get up.  Once he did get up (12:15pm) and went to kitchen table he sat down, had episode of lightheadedness and near syncope, was unable to communicate well because he couldn't stay completely conscious. He denies fever,no n/v, no chest pain , no jerky movement, no bowel /bladder incontinence, he denies fall, no injury, he has baseline DOE. He reported his cardiology has adjusted his diuretics a week ago.  Ed course, he is found to be orthostatic, had hypokalemia, k 2.5, wbc16.5, cr 2.99, glucose 184, troponin negative, ua pending collection,  cxr no acute findings, ekg with sinus rhythm, few pac's, QTc prolongation at , he received ivf, potassium supplement, now feeling better at rest.  [facility] [place]  cardiology Dr Algie Coffer.   Review of Systems:  Detail per HPI, Review of systems are otherwise negative  Past Medical History:  Diagnosis Date  . CHF (congestive heart failure) (HCC)   . Chronic kidney disease   . COPD (chronic obstructive pulmonary disease) (HCC)   . GERD (gastroesophageal reflux disease)   . Hypertension   . Respiratory insufficiency    History reviewed. No pertinent surgical history. Social History:  reports that he quit smoking about 2 years ago. His smoking use included Cigarettes. He has a 35.00 pack-year smoking history. He has never used smokeless tobacco. He reports that he does not drink alcohol or use drugs. Patient lives at home with  his sister & is able to participate in activities of daily living independently  He moved to [place] from [employer] a year ago  Allergies  Allergen Reactions  . Other Other (See Comments)    Oxygen Sensitive  . Penicillins Rash and Other (See Comments)    Any -illins as well as Mycins Has patient had a PCN reaction causing immediate rash, facial/tongue/throat swelling, SOB or lightheadedness with hypotension: Yes Has patient had a PCN reaction causing severe rash involving mucus membranes or skin necrosis: Yes Has patient had a PCN reaction that required hospitalization No Has patient had a PCN reaction occurring within the last 10 years: No If all of the above answers are "NO", then may proceed with Cephalosporin use.   . Vitamin B12 Itching, Rash and Other (See Comments)    injections    Family History  Problem Relation Age of Onset  . Tuberculosis Mother   . Diabetes Father       Prior to Admission medications   Medication Sig Start Date End Date Taking? Authorizing Provider  albuterol (PROVENTIL HFA;VENTOLIN HFA) 108 (90 Base) MCG/ACT inhaler Inhale 1-2 puffs into the lungs every 6 (six) hours as needed for wheezing or shortness of breath.   Yes Historical Provider, MD  albuterol (PROVENTIL) (2.5 MG/3ML) 0.083% nebulizer solution Take 3 mLs (2.5 mg total) by nebulization every 6 (six) hours as needed for wheezing or shortness of breath. Reported on 07/14/2015 02/15/16  Yes Tammy S Parrett, NP  amLODipine (NORVASC) 5 MG tablet Take 1 tablet (5 mg total) by mouth daily.  09/14/15  Yes Simonne MartinetPeter E Babcock, NP  aspirin EC 81 MG tablet Take 81 mg by mouth daily.   Yes Historical Provider, MD  budesonide-formoterol (SYMBICORT) 160-4.5 MCG/ACT inhaler Inhale 2 puffs into the lungs 2 (two) times daily.   Yes Historical Provider, MD  cholecalciferol (VITAMIN D) 400 UNITS TABS tablet Take 400 Units by mouth daily.    Yes Historical Provider, MD  Emollient (AQUAPHOR EX) Apply 1 application  topically daily as needed (dry skin. (hands)).   Yes Historical Provider, MD  furosemide (LASIX) 20 MG tablet Take 4 tablets (80 mg total) by mouth daily. 03/18/16  Yes Orpah CobbAjay Kadakia, MD  isosorbide mononitrate (IMDUR) 30 MG 24 hr tablet Take 30 mg by mouth daily.   Yes Historical Provider, MD  metolazone (ZAROXOLYN) 5 MG tablet Take 5 mg by mouth daily.   Yes Historical Provider, MD  predniSONE (DELTASONE) 10 MG tablet Take 1 tablet (10 mg total) by mouth daily with breakfast. 11/19/15  Yes Oretha Milchakesh V Alva, MD  tiotropium (SPIRIVA) 18 MCG inhalation capsule Place 18 mcg into inhaler and inhale daily.   Yes Historical Provider, MD    Physical Exam: BP 133/79 (BP Location: Right Arm)   Pulse 88   Temp 98.5 F (36.9 C) (Oral)   Resp 13   Ht 5\' 7"  (1.702 m)   Wt 83.5 kg (184 lb)   SpO2 95%   BMI 28.82 kg/m   General:  NAD, very pleasant Eyes: PERRL ENT: unremarkable Neck: supple, no JVD Cardiovascular: RRR Respiratory: CTABL Abdomen: protuberant abdomen, soft/NT, positive bowel sounds Skin: no rash Musculoskeletal:  No edema Psychiatric: calm/cooperative Neurologic: no focal findings            Labs on Admission:  Basic Metabolic Panel:  Recent Labs Lab 04/11/16 1433  NA 137  K 2.5*  CL 84*  CO2 35*  GLUCOSE 184*  BUN 60*  CREATININE 2.99*  CALCIUM 10.3  MG 2.4   Liver Function Tests:  Recent Labs Lab 04/11/16 1433  AST 28  ALT 40  ALKPHOS 58  BILITOT 1.1  PROT 8.0  ALBUMIN 4.7   No results for input(s): LIPASE, AMYLASE in the last 168 hours. No results for input(s): AMMONIA in the last 168 hours. CBC:  Recent Labs Lab 04/11/16 1433  WBC 16.5*  NEUTROABS 13.4*  HGB 16.4  HCT 49.5  MCV 84.3  PLT 252   Cardiac Enzymes: No results for input(s): CKTOTAL, CKMB, CKMBINDEX, TROPONINI in the last 168 hours.  BNP (last 3 results)  Recent Labs  09/07/15 1713 03/15/16 1847 04/11/16 1433  BNP 45.9 63.7 342.2*    ProBNP (last 3 results) No results  for input(s): PROBNP in the last 8760 hours.  CBG: No results for input(s): GLUCAP in the last 168 hours.  Radiological Exams on Admission: Dg Chest Portable 1 View  Result Date: 04/11/2016 CLINICAL DATA:  Near syncope, lightheadedness EXAM: PORTABLE CHEST 1 VIEW COMPARISON:  03/15/2016 FINDINGS: There is no focal parenchymal opacity. There is no pleural effusion or pneumothorax. The heart and mediastinal contours are unremarkable. The osseous structures are unremarkable. IMPRESSION: No active disease. Electronically Signed   By: Elige KoHetal  Patel   On: 04/11/2016 14:24    Assessment/Plan Present on Admission: **None**  Near syncope with orthostatic hypotention:  likely from overdiuresis. Hold all diuretics, gentle hydration, repeat orthostatic in am Observe with tele  Hypokalemia :  no n/v, no diarrhea, does has poor appetite, but most likely diuretic induced,  Patient received iv  kcl , oral kcl in the ED, will give additional kcl q4hrs x4, will give iv mag 2g this evening, Repeat k/mag level in am  QTC prolongation, likely from electrolyte abnormalities. Keep k> 4, mag >2, repeat ekg in am   Leukocytosis: likely from steroids, ua pending ,cxr unremarkable, no fever, patient does not look septic  Elevated blood glucose, no h/o of DM, likely steroids induced, will check a1c, diet control for now  Chronic Combined CHF: last lvef in 02/2016 45-50%, grade I diastolic dysfunction, currently dry, hold diuretics   CKD III/IV, bun/cr 60/2.99, slightly higher than baseline Likely from overdiuresis, hold diuretics  ua pending,  Repeat bmp in am  COPD, prednisone dependent, but not on home o2, currently stable, no wheezing, no cough  HTN ; continue home meds norvasc/imdur, hold diuretics   DVT prophylaxis: heparin subQ  Consultants: none  Code Status: full   Family Communication:  Patient   Disposition Plan: observation with tele  Time spent:  Neiman Roots  MD, PhD [facility] Pager [phone] If 7PM-7AM, please contact night-coverage at [email or web address], password [facility]

## 2016-04-12 DIAGNOSIS — E876 Hypokalemia: Secondary | ICD-10-CM

## 2016-04-12 DIAGNOSIS — I4581 Long QT syndrome: Secondary | ICD-10-CM | POA: Diagnosis not present

## 2016-04-12 DIAGNOSIS — R55 Syncope and collapse: Secondary | ICD-10-CM | POA: Diagnosis not present

## 2016-04-12 DIAGNOSIS — J438 Other emphysema: Secondary | ICD-10-CM | POA: Diagnosis not present

## 2016-04-12 DIAGNOSIS — I951 Orthostatic hypotension: Secondary | ICD-10-CM | POA: Diagnosis not present

## 2016-04-12 DIAGNOSIS — L8991 Pressure ulcer of unspecified site, stage 1: Secondary | ICD-10-CM | POA: Insufficient documentation

## 2016-04-12 DIAGNOSIS — I491 Atrial premature depolarization: Secondary | ICD-10-CM | POA: Diagnosis not present

## 2016-04-12 LAB — CBC WITH DIFFERENTIAL/PLATELET
Basophils Absolute: 0 10*3/uL (ref 0.0–0.1)
Basophils Relative: 0 %
Eosinophils Absolute: 0.1 10*3/uL (ref 0.0–0.7)
Eosinophils Relative: 1 %
HEMATOCRIT: 45.7 % (ref 39.0–52.0)
HEMOGLOBIN: 15 g/dL (ref 13.0–17.0)
LYMPHS ABS: 2.4 10*3/uL (ref 0.7–4.0)
LYMPHS PCT: 18 %
MCH: 27.6 pg (ref 26.0–34.0)
MCHC: 32.8 g/dL (ref 30.0–36.0)
MCV: 84 fL (ref 78.0–100.0)
MONO ABS: 1.1 10*3/uL — AB (ref 0.1–1.0)
MONOS PCT: 8 %
NEUTROS ABS: 9.9 10*3/uL — AB (ref 1.7–7.7)
NEUTROS PCT: 73 %
Platelets: 252 10*3/uL (ref 150–400)
RBC: 5.44 MIL/uL (ref 4.22–5.81)
RDW: 16.2 % — AB (ref 11.5–15.5)
WBC: 13.5 10*3/uL — ABNORMAL HIGH (ref 4.0–10.5)

## 2016-04-12 LAB — BASIC METABOLIC PANEL
Anion gap: 16 — ABNORMAL HIGH (ref 5–15)
BUN: 70 mg/dL — ABNORMAL HIGH (ref 6–20)
CALCIUM: 10 mg/dL (ref 8.9–10.3)
CHLORIDE: 92 mmol/L — AB (ref 101–111)
CO2: 31 mmol/L (ref 22–32)
CREATININE: 2.84 mg/dL — AB (ref 0.61–1.24)
GFR calc non Af Amer: 19 mL/min — ABNORMAL LOW (ref >60)
GFR, EST AFRICAN AMERICAN: 22 mL/min — AB (ref >60)
GLUCOSE: 146 mg/dL — AB (ref 65–99)
Potassium: 3.3 mmol/L — ABNORMAL LOW (ref 3.5–5.1)
Sodium: 139 mmol/L (ref 135–145)

## 2016-04-12 LAB — MAGNESIUM: Magnesium: 2.7 mg/dL — ABNORMAL HIGH (ref 1.7–2.4)

## 2016-04-12 LAB — TSH: TSH: 1.905 u[IU]/mL (ref 0.350–4.500)

## 2016-04-12 MED ORDER — FUROSEMIDE 20 MG PO TABS: 80.0000 mg | ORAL_TABLET | Freq: Every day | ORAL | 3 refills | Status: DC

## 2016-04-12 MED ORDER — METOLAZONE 5 MG PO TABS
5.0000 mg | ORAL_TABLET | Freq: Every day | ORAL | Status: DC
Start: 2016-04-19 — End: 2016-09-11

## 2016-04-12 MED ORDER — POTASSIUM CHLORIDE CRYS ER 20 MEQ PO TBCR
40.0000 meq | EXTENDED_RELEASE_TABLET | Freq: Two times a day (BID) | ORAL | Status: DC
  Administered 2016-04-12: 40 meq via ORAL
  Filled 2016-04-12: qty 2

## 2016-04-12 NOTE — Care Management Note (Signed)
Case Management Note  Patient Details  Name: Greg RobertsJonathan Jones MRN: [id number] Date of Birth: 05/17/1928  Subjective/Objective: Received call from attending that she was putting in PT imminent cons for d/c w/HHPT. Awaiting PT imminent cons-notified rehab.Patient already has rw. [facility] chosen for HHPT-rep Darl PikesSusan aware. No further CM needs.                   Action/Plan:d/c home w/HHPT.   Expected Discharge Date:   (unknown)               Expected Discharge Plan:  Home w Home Health Services  In-House Referral:     Discharge planning Services  CM Consult  Post Acute Care Choice:    Choice offered to:  Patient  DME Arranged:    DME Agency:     HH Arranged:  PT HH Agency:     Status of Service:  Completed, signed off  If discussed at Long Length of Stay Meetings, dates discussed:    Additional Comments:  Lanier ClamMahabir, Any Mcneice, RN 04/12/2016, 2:25 PM

## 2016-04-12 NOTE — Evaluation (Signed)
Physical Therapy Evaluation Patient Details Name: Greg Jones MRN: [id number] DOB: 11/01/1927 Today's Date: 04/12/2016   History of Present Illness  82 yo male admitted with dizziness, orthostatic hypotension. Hx of CHF, COPD, HTN  Clinical Impression  On eval, pt was Min guard assist for mobility. He walked ~125 feet without an assistive device. Mildly unsteady towards end of ambulation distance. Dyspnea 2/4 as well. Sister reports pt gets dyspneic with activity at baseline. Recommend HHPT follow up to ensure safe transition back into home environment. Encouraged pt to use walker at home if needed.     Follow Up Recommendations Home health PT;Supervision - Intermittent    Equipment Recommendations  None recommended by PT (pt states he already has a RW)    Recommendations for Other Services       Precautions / Restrictions Precautions Precautions: Fall Restrictions Weight Bearing Restrictions: No      Mobility  Bed Mobility               General bed mobility comments: sitting EOB  Transfers Overall transfer level: Needs assistance   Transfers: Sit to/from Stand Sit to Stand: Supervision         General transfer comment: for safety  Ambulation/Gait Ambulation/Gait assistance: Min guard Ambulation Distance (Feet): 125 Feet Assistive device: None Gait Pattern/deviations: Step-through pattern     General Gait Details: close guard for safety. 1 brief standing rest break midway. Dyspnea 2/4. Pt reported LE fatigued after ~90 feet.   Stairs            Wheelchair Mobility    Modified Rankin (Stroke Patients Only)       Balance Overall balance assessment: Needs assistance           Standing balance-Leahy Scale: Fair                               Pertinent Vitals/Pain Pain Assessment: No/denies pain    Home Living Family/patient expects to be discharged to:: Private residence Living Arrangements: Other relatives  (sister) Available Help at Discharge: Family Type of Home: House Home Access: Level entry     Home Layout: One level        Prior Function Level of Independence: Independent;Needs assistance      ADL's / Homemaking Assistance Needed: sister helps with getting into shower and donning/doffing shoes/socks  Comments: Can walk around home without device, uses device for energy support when needed     Hand Dominance        Extremity/Trunk Assessment   Upper Extremity Assessment: Generalized weakness           Lower Extremity Assessment: Generalized weakness      Cervical / Trunk Assessment: Normal  Communication   Communication: No difficulties  Cognition Arousal/Alertness: Awake/alert Behavior During Therapy: WFL for tasks assessed/performed Overall Cognitive Status: Within Functional Limits for tasks assessed                      General Comments      Exercises     Assessment/Plan    PT Assessment All further PT needs can be met in the next venue of care (HHPT)  PT Problem List Decreased mobility;Decreased activity tolerance          PT Treatment Interventions      PT Goals (Current goals can be found in the Care Plan section)  Acute Rehab PT Goals Patient Stated Goal: home   PT Goal Formulation: All assessment and education complete, DC therapy    Frequency     Barriers to discharge        Co-evaluation               End of Session Equipment Utilized During Treatment: Gait belt Activity Tolerance: Patient tolerated treatment well (get dyspneic at baseline per family) Patient left: in bed;with call bell/phone within reach;with family/visitor present      Functional Assessment Tool Used: clinical judgement Functional Limitation: Mobility: Walking and moving around Mobility: Walking and Moving Around Current Status (G8978): At least 1 percent but less than 20 percent impaired, limited or restricted Mobility: Walking and Moving  Around Goal Status (G8979): At least 1 percent but less than 20 percent impaired, limited or restricted Mobility: Walking and Moving Around Discharge Status (G8980): At least 1 percent but less than 20 percent impaired, limited or restricted    Time: 1540-1549 PT Time Calculation (min) (ACUTE ONLY): 9 min   Charges:   PT Evaluation $PT Eval Low Complexity: 1 Procedure     PT G Codes:   PT G-Codes **NOT FOR INPATIENT CLASS** Functional Assessment Tool Used: clinical judgement Functional Limitation: Mobility: Walking and moving around Mobility: Walking and Moving Around Current Status (G8978): At least 1 percent but less than 20 percent impaired, limited or restricted Mobility: Walking and Moving Around Goal Status (G8979): At least 1 percent but less than 20 percent impaired, limited or restricted Mobility: Walking and Moving Around Discharge Status (G8980): At least 1 percent but less than 20 percent impaired, limited or restricted    Jannie Porter, MPT Pager: [phone]   

## 2016-04-12 NOTE — Care Management Obs Status (Signed)
MEDICARE OBSERVATION STATUS NOTIFICATION   Patient Details  Name: Greg RobertsJonathan Braunschweig MRN: [id number] Date of Birth: 08/27/1927   Medicare Observation Status Notification Given:  Yes    MahabirOlegario Messier, Jakori Burkett, RN 04/12/2016, 11:05 AM

## 2016-04-12 NOTE — Discharge Summary (Signed)
Physician Discharge Summary  Greg RobertsJonathan Jones ZOX:[id number] DOB: 07/30/1927 DOA: 04/11/2016  PCP: Greg RadarOBERSON, [name], MD  Admit date: 04/11/2016 Discharge date: 04/12/2016  Admitted From: Home.  Disposition:  Home.   Recommendations for Outpatient Follow-up:  1. Follow up with PCP in 1-2 weeks 2. Please obtain BMP/CBC in one week 3. Please follow up on the following pending results:  Home Health:yes  Discharge Condition:stable.  CODE STATUS:full code Diet recommendation: Heart Healthy   Brief/Interim Summary: With h/o combined chf (last lvef in 02/2016 45-50%, grade I diastolic dysfunction), h/o copd, prednisone dependent, but not on home o2, h/o HTN presented to [facility] ED due to dizziness, near syncopal episode, and orthostatic hypotension.   Discharge Diagnoses:  Active Problems:   COPD (chronic obstructive pulmonary disease) (HCC)   Essential hypertension   Chronic combined systolic and diastolic CHF (congestive heart failure) (HCC)   Near syncope   Pre-syncope   CKD (chronic kidney disease), stage IV (HCC)   QT prolongation   Leukocytosis   Hypokalemia   Blood glucose elevated   Near syncope with orthostatic hypotention:  likely from overdiuresis. Hold all diuretics, gentle hydration, repeat orthostatic in am are negative.  Recommend holding diuretics for one week and resume them.   Hypokalemia : repleted .   QTC prolongation, likely from electrolyte abnormalities. Keep k> 4, mag >2, repeat ekg in am shows improveemnt in the qt interval.   Leukocytosis: likely from steroids, ua pending ,cxr unremarkable, no fever, patient does not look septic  Elevated blood glucose, no h/o of DM, likely steroids induced, hgba1c is pending.   Chronic Combined CHF: last lvef in 02/2016 45-50%, grade I diastolic dysfunction, currently dry, hold diuretics for one week and resume them in one week.   CKD III/IV, bun/cr 60/2.99, slightly higher than baseline Likely from  overdiuresis, hold diuretics for one week and resume them.   COPD, prednisone dependent, but not on home o2, currently stable, no wheezing, no cough  HTN ;better controlled.   Discharge Instructions  Discharge Instructions    Diet - low sodium heart healthy    Complete by:  As directed    Discharge instructions    Complete by:  As directed    Follow up with PCP in  One week.       Medication List    TAKE these medications   albuterol 108 (90 Base) MCG/ACT inhaler Commonly known as:  PROVENTIL HFA;VENTOLIN HFA Inhale 1-2 puffs into the lungs every 6 (six) hours as needed for wheezing or shortness of breath.   albuterol (2.5 MG/3ML) 0.083% nebulizer solution Commonly known as:  PROVENTIL Take 3 mLs (2.5 mg total) by nebulization every 6 (six) hours as needed for wheezing or shortness of breath. Reported on 07/14/2015   amLODipine 5 MG tablet Commonly known as:  NORVASC Take 1 tablet (5 mg total) by mouth daily.   AQUAPHOR EX Apply 1 application topically daily as needed (dry skin. (hands)).   aspirin EC 81 MG tablet Take 81 mg by mouth daily.   budesonide-formoterol 160-4.5 MCG/ACT inhaler Commonly known as:  SYMBICORT Inhale 2 puffs into the lungs 2 (two) times daily.   cholecalciferol 400 units Tabs tablet Commonly known as:  VITAMIN D Take 400 Units by mouth daily.   furosemide 20 MG tablet Commonly known as:  LASIX Take 4 tablets (80 mg total) by mouth daily. Start taking on:  04/19/2016   isosorbide mononitrate 30 MG 24 hr tablet Commonly known as:  IMDUR Take 30  mg by mouth daily.   metolazone 5 MG tablet Commonly known as:  ZAROXOLYN Take 1 tablet (5 mg total) by mouth daily. Start taking on:  04/19/2016   predniSONE 10 MG tablet Commonly known as:  DELTASONE Take 1 tablet (10 mg total) by mouth daily with breakfast.   tiotropium 18 MCG inhalation capsule Commonly known as:  SPIRIVA Place 18 mcg into inhaler and inhale daily.      Follow-up  Information    [facility] .   Why:  Home Health physical therapy Contact information: [address] [place] [address] [phone]        Greg Radar, MD. Schedule an appointment as soon as possible for a visit in 1 week(s).   Specialty:  Nurse Practitioner Contact information: [address] [place] [address] [phone]          Allergies  Allergen Reactions  . Other Other (See Comments)    Oxygen Sensitive  . Penicillins Rash and Other (See Comments)    Any -illins as well as Mycins Has patient had a PCN reaction causing immediate rash, facial/tongue/throat swelling, SOB or lightheadedness with hypotension: Yes Has patient had a PCN reaction causing severe rash involving mucus membranes or skin necrosis: Yes Has patient had a PCN reaction that required hospitalization No Has patient had a PCN reaction occurring within the last 10 years: No If all of the above answers are "NO", then may proceed with Cephalosporin use.   . Vitamin B12 Itching, Rash and Other (See Comments)    injections    Consultations:  none   Procedures/Studies: Dg Chest 2 View  Result Date: 03/15/2016 CLINICAL DATA:  Congestive heart failure. Shortness breath and wheezing. EXAM: CHEST  2 VIEW COMPARISON:  The chest x-ray 09/12/2015. FINDINGS: The heart size is normal. Atherosclerotic calcifications are present at the aortic arch. Pulmonary vascular congestion is present without frank edema. There are no effusions. No focal airspace consolidation is present. Mild emphysematous changes are noted. The visualized soft tissues and bony thorax are unremarkable. IMPRESSION: 1. Moderate pulmonary vascular congestion without frank edema. 2. Atherosclerosis. 3. Emphysema. Electronically Signed   By: Marin Jones M.D.   On: 03/15/2016 21:52   US Abdomen Limited  Result Date: 03/16/2016 CLINICAL DATA:  Assess for ascites. Abdominal distention for 2  months. EXAM: LIMITED ABDOMEN ULTRASOUND FOR ASCITES TECHNIQUE: Limited ultrasound survey for ascites was performed in all four abdominal quadrants. COMPARISON:  CT abdomen and pelvis February 07, 2016 FINDINGS: No free fluid in the abdomen or pelvis. Urinary bladder is partially distended. IMPRESSION: No ascites. Electronically Signed   By: Awilda Metro M.D.   On: 03/16/2016 01:05   Dg Chest Portable 1 View  Result Date: 04/11/2016 CLINICAL DATA:  Near syncope, lightheadedness EXAM: PORTABLE CHEST 1 VIEW COMPARISON:  03/15/2016 FINDINGS: There is no focal parenchymal opacity. There is no pleural effusion or pneumothorax. The heart and mediastinal contours are unremarkable. The osseous structures are unremarkable. IMPRESSION: No active disease. Electronically Signed   By: Elige Ko   On: 04/11/2016 14:24       Subjective: No new complaints.   Discharge Exam: Vitals:   04/12/16 0451 04/12/16 1358  BP: (!) 153/73 (!) 157/86  Pulse: (!) 48 98  Resp: 14 18  Temp: 98.4 F (36.9 C) 98 F (36.7 C)   Vitals:   04/11/16 2224 04/12/16 0451 04/12/16 0901 04/12/16 1358  BP: 122/80 (!) 153/73  (!) 157/86  Pulse: 97 (!)  48  98  Resp: 18 14  18   Temp: 97.9 F (36.6 C) 98.4 F (36.9 C)  98 F (36.7 C)  TempSrc: Oral Oral  Oral  SpO2: 95% 96% 96% 95%  Weight:      Height:        General: Pt is alert, awake, not in acute distress Cardiovascular: RRR, S1/S2 +, no rubs, no gallops Respiratory: CTA bilaterally, no wheezing, no rhonchi Abdominal: Soft, NT, ND, bowel sounds + Extremities: no edema, no cyanosis    The results of significant diagnostics from this hospitalization (including imaging, microbiology, ancillary and laboratory) are listed below for reference.     Microbiology: No results found for this or any previous visit (from the past 240 hour(s)).   Labs: BNP (last 3 results)  Recent Labs  09/07/15 1713 03/15/16 1847 04/11/16 1433  BNP 45.9 63.7 342.2*    Basic Metabolic Panel:  Recent Labs Lab 04/11/16 1433 04/12/16 0526  NA 137 139  K 2.5* 3.3*  CL 84* 92*  CO2 35* 31  GLUCOSE 184* 146*  BUN 60* 70*  CREATININE 2.99* 2.84*  CALCIUM 10.3 10.0  MG 2.4 2.7*   Liver Function Tests:  Recent Labs Lab 04/11/16 1433  AST 28  ALT 40  ALKPHOS 58  BILITOT 1.1  PROT 8.0  ALBUMIN 4.7   No results for input(s): LIPASE, AMYLASE in the last 168 hours. No results for input(s): AMMONIA in the last 168 hours. CBC:  Recent Labs Lab 04/11/16 1433 04/12/16 0526  WBC 16.5* 13.5*  NEUTROABS 13.4* 9.9*  HGB 16.4 15.0  HCT 49.5 45.7  MCV 84.3 84.0  PLT 252 252   Cardiac Enzymes: No results for input(s): CKTOTAL, CKMB, CKMBINDEX, TROPONINI in the last 168 hours. BNP: Invalid input(s): POCBNP CBG: No results for input(s): GLUCAP in the last 168 hours. D-Dimer No results for input(s): DDIMER in the last 72 hours. Hgb A1c No results for input(s): HGBA1C in the last 72 hours. Lipid Profile No results for input(s): CHOL, HDL, LDLCALC, TRIG, CHOLHDL, LDLDIRECT in the last 72 hours. Thyroid function studies  Recent Labs  04/12/16 0526  TSH 1.905   Anemia work up No results for input(s): VITAMINB12, FOLATE, FERRITIN, TIBC, IRON, RETICCTPCT in the last 72 hours. Urinalysis    Component Value Date/Time   COLORURINE YELLOW 04/11/2016 1731   APPEARANCEUR CLEAR 04/11/2016 1731   LABSPEC 1.010 04/11/2016 1731   PHURINE 6.0 04/11/2016 1731   GLUCOSEU NEGATIVE 04/11/2016 1731   HGBUR NEGATIVE 04/11/2016 1731   BILIRUBINUR NEGATIVE 04/11/2016 1731   KETONESUR NEGATIVE 04/11/2016 1731   PROTEINUR NEGATIVE 04/11/2016 1731   NITRITE NEGATIVE 04/11/2016 1731   LEUKOCYTESUR NEGATIVE 04/11/2016 1731   Sepsis Labs Invalid input(s): PROCALCITONIN,  WBC,  LACTICIDVEN Microbiology No results found for this or any previous visit (from the past 240 hour(s)).   Time coordinating discharge: Over 30  minutes  SIGNED:   Kathlen Mody, MD  [facility] 04/12/2016, 4:17 PM Pager   If 7PM-7AM, please contact night-coverage [email or web address] Password [email or web address]

## 2016-04-12 NOTE — Care Management Note (Signed)
Case Management Note  Patient Details  Name: Greg RobertsJonathan Jones MRN: [id number] Date of Birth: 12/04/1927  Subjective/Objective:82 y/o m admitted w/syncope. From home. Per nursing no PT cons needed.No CM needs.                    Action/Plan:d/c plan home.   Expected Discharge Date:   (unknown)               Expected Discharge Plan:  Home/Self Care  In-House Referral:     Discharge planning Services  CM Consult  Post Acute Care Choice:    Choice offered to:     DME Arranged:    DME Agency:     HH Arranged:    HH Agency:     Status of Service:  In process, will continue to follow  If discussed at Long Length of Stay Meetings, dates discussed:    Additional Comments:  Lanier ClamMahabir, Denaya Horn, RN 04/12/2016, 11:06 AM

## 2016-04-24 ENCOUNTER — Other Ambulatory Visit: Payer: Self-pay | Admitting: Pulmonary Disease

## 2016-05-01 ENCOUNTER — Ambulatory Visit (INDEPENDENT_AMBULATORY_CARE_PROVIDER_SITE_OTHER): Payer: Medicare Other | Admitting: Pulmonary Disease

## 2016-05-01 ENCOUNTER — Encounter: Payer: Self-pay | Admitting: Pulmonary Disease

## 2016-05-01 DIAGNOSIS — J449 Chronic obstructive pulmonary disease, unspecified: Secondary | ICD-10-CM

## 2016-05-01 MED ORDER — PREDNISONE 10 MG PO TABS: ORAL_TABLET | ORAL | 1 refills | Status: DC

## 2016-05-01 NOTE — Progress Notes (Signed)
   Subjective:    Patient ID: Greg RobertsJonathan Poe, male    DOB: 10/11/1927, 82 y.o.   MRN: [id number]  HPI  82 year old  for FU of COPD . He smoked about half pack per day for 70 years-about 35 pack years before he quit in 2014. His wife passed away in June, and he moved to [place] to be with his sister. He also has hypertension & CK D stage 4. He sees [place] for systolic CHF -EF 78%50% AAA at 3.6cm on CT ABD   [facility] [place]  05/01/2016  Chief Complaint  Patient presents with  . Follow-up    Pt. c/o of breathing is getting worse, increase SOB with exertion, wheezing, Pt has trouble catching his breath, Pt. is coughing with some clear mucus, has needed to use his rescue inhaler,    He was hospitalized 03/2016 -noted to have hypokalemia and was put on potassium supplements He ran out of prednisone about a week ago. Dyspnea-has good and bad days. His sister wonders of Zaroxolyn caused his symptoms. He also complains of increased constipation  He is maintained on a regimen of Symbicort and Spiriva and uses albuterol on an as-needed basis He has a very sedentary lifestyle  Hosp 08/2015 for COPD flare vs edema   Significant tests/ events  PFTs 10/2014 showed FEV1 of 0.84-39%, FVC of 2.1-67%, without significant broncho-dilator response. DLCO was relatively preserved at 73%. 04/2015 Spirometry showed FEV1 of 34%-0.81   Review of Systems neg for any significant sore throat, dysphagia, itching, sneezing, nasal congestion or excess/ purulent secretions, fever, chills, sweats, unintended wt loss, pleuritic or exertional cp, hempoptysis, orthopnea pnd or change in chronic leg swelling. Also denies presyncope, palpitations, heartburn, abdominal pain, nausea, vomiting, diarrhea or change in bowel or urinary habits, dysuria,hematuria, rash, arthralgias, visual complaints, headache, numbness weakness or ataxia.     Objective:   Physical Exam  Gen. Pleasant,  well-nourished, in no distress ENT - no lesions, no post nasal drip Neck: No JVD, no thyromegaly, no carotid bruits Lungs: no use of accessory muscles, no dullness to percussion, coarse without rales or rhonchi  Cardiovascular: Rhythm regular, heart sounds  normal, no murmurs or gallops, no peripheral edema Musculoskeletal: No deformities, no cyanosis or clubbing        Assessment & Plan:

## 2016-05-01 NOTE — Patient Instructions (Signed)
Prednisone 10 mg tabs  Take 2 tabs daily with food x 5ds, then 1 tab daily with food  #90 x 1 refill  Stay on Symbicort and Spiriva  Take miralax 17 g daily -as fiber  For laxative, okay to take Dulcolax 10 mg as needed 

## 2016-05-01 NOTE — Addendum Note (Signed)
Addended by: Garfield CorneaMABRY, JASMINE L on: 05/01/2016 02:21 PM   Modules accepted: Orders

## 2016-05-01 NOTE — Assessment & Plan Note (Signed)
Prednisone 10 mg tabs  Take 2 tabs daily with food x 5ds, then 1 tab daily with food  #90 x 1 refill  Stay on Symbicort and Spiriva  Take miralax 17 g daily -as fiber  For laxative, okay to take Dulcolax 10 mg as needed

## 2016-05-02 ENCOUNTER — Other Ambulatory Visit (HOSPITAL_COMMUNITY): Payer: Self-pay | Admitting: Gastroenterology

## 2016-05-02 DIAGNOSIS — R110 Nausea: Secondary | ICD-10-CM

## 2016-05-02 DIAGNOSIS — R6881 Early satiety: Secondary | ICD-10-CM

## 2016-05-02 DIAGNOSIS — R634 Abnormal weight loss: Secondary | ICD-10-CM

## 2016-05-08 ENCOUNTER — Ambulatory Visit (HOSPITAL_COMMUNITY)
Admission: RE | Admit: 2016-05-08 | Discharge: 2016-05-08 | Disposition: A | Discharge status: Home / Self Care | Payer: Medicare Other | Source: Ambulatory Visit | Attending: Gastroenterology | Admitting: Gastroenterology

## 2016-05-08 DIAGNOSIS — K224 Dyskinesia of esophagus: Secondary | ICD-10-CM | POA: Diagnosis not present

## 2016-05-08 DIAGNOSIS — R110 Nausea: Secondary | ICD-10-CM

## 2016-05-08 DIAGNOSIS — R634 Abnormal weight loss: Secondary | ICD-10-CM | POA: Diagnosis present

## 2016-05-08 DIAGNOSIS — K219 Gastro-esophageal reflux disease without esophagitis: Secondary | ICD-10-CM | POA: Diagnosis not present

## 2016-05-08 DIAGNOSIS — K449 Diaphragmatic hernia without obstruction or gangrene: Secondary | ICD-10-CM | POA: Diagnosis not present

## 2016-05-08 DIAGNOSIS — R6881 Early satiety: Secondary | ICD-10-CM | POA: Diagnosis present

## 2016-06-30 ENCOUNTER — Emergency Department (HOSPITAL_COMMUNITY)
Admission: EM | Admit: 2016-06-30 | Discharge: 2016-07-01 | Disposition: A | Discharge status: Home / Self Care | Payer: Medicare Other | Attending: Emergency Medicine | Admitting: Emergency Medicine

## 2016-06-30 ENCOUNTER — Emergency Department (HOSPITAL_COMMUNITY): Payer: Medicare Other

## 2016-06-30 ENCOUNTER — Encounter (HOSPITAL_COMMUNITY): Payer: Self-pay | Admitting: Emergency Medicine

## 2016-06-30 DIAGNOSIS — I130 Hypertensive heart and chronic kidney disease with heart failure and stage 1 through stage 4 chronic kidney disease, or unspecified chronic kidney disease: Secondary | ICD-10-CM | POA: Insufficient documentation

## 2016-06-30 DIAGNOSIS — N184 Chronic kidney disease, stage 4 (severe): Secondary | ICD-10-CM | POA: Insufficient documentation

## 2016-06-30 DIAGNOSIS — Z7982 Long term (current) use of aspirin: Secondary | ICD-10-CM | POA: Insufficient documentation

## 2016-06-30 DIAGNOSIS — I5023 Acute on chronic systolic (congestive) heart failure: Secondary | ICD-10-CM | POA: Diagnosis not present

## 2016-06-30 DIAGNOSIS — J441 Chronic obstructive pulmonary disease with (acute) exacerbation: Secondary | ICD-10-CM | POA: Diagnosis not present

## 2016-06-30 DIAGNOSIS — R0602 Shortness of breath: Secondary | ICD-10-CM | POA: Insufficient documentation

## 2016-06-30 DIAGNOSIS — Z87891 Personal history of nicotine dependence: Secondary | ICD-10-CM | POA: Insufficient documentation

## 2016-06-30 DIAGNOSIS — Z79899 Other long term (current) drug therapy: Secondary | ICD-10-CM | POA: Insufficient documentation

## 2016-06-30 LAB — CBC WITH DIFFERENTIAL/PLATELET
BASOS PCT: 0 %
Basophils Absolute: 0 10*3/uL (ref 0.0–0.1)
EOS ABS: 0 10*3/uL (ref 0.0–0.7)
Eosinophils Relative: 0 %
HEMATOCRIT: 38.8 % — AB (ref 39.0–52.0)
HEMOGLOBIN: 12.8 g/dL — AB (ref 13.0–17.0)
LYMPHS ABS: 0.7 10*3/uL (ref 0.7–4.0)
Lymphocytes Relative: 5 %
MCH: 26.9 pg (ref 26.0–34.0)
MCHC: 33 g/dL (ref 30.0–36.0)
MCV: 81.7 fL (ref 78.0–100.0)
Monocytes Absolute: 0.2 10*3/uL (ref 0.1–1.0)
Monocytes Relative: 2 %
NEUTROS ABS: 12.3 10*3/uL — AB (ref 1.7–7.7)
NEUTROS PCT: 93 %
Platelets: 288 10*3/uL (ref 150–400)
RBC: 4.75 MIL/uL (ref 4.22–5.81)
RDW: 16.5 % — ABNORMAL HIGH (ref 11.5–15.5)
WBC: 13.2 10*3/uL — AB (ref 4.0–10.5)

## 2016-06-30 LAB — BASIC METABOLIC PANEL
ANION GAP: 16 — AB (ref 5–15)
BUN: 39 mg/dL — ABNORMAL HIGH (ref 6–20)
CHLORIDE: 91 mmol/L — AB (ref 101–111)
CO2: 29 mmol/L (ref 22–32)
Calcium: 9.9 mg/dL (ref 8.9–10.3)
Creatinine, Ser: 2.26 mg/dL — ABNORMAL HIGH (ref 0.61–1.24)
GFR calc Af Amer: 29 mL/min — ABNORMAL LOW (ref >60)
GFR calc non Af Amer: 25 mL/min — ABNORMAL LOW (ref >60)
Glucose, Bld: 231 mg/dL — ABNORMAL HIGH (ref 65–99)
POTASSIUM: 2.8 mmol/L — AB (ref 3.5–5.1)
SODIUM: 136 mmol/L (ref 135–145)

## 2016-06-30 LAB — I-STAT TROPONIN, ED: Troponin i, poc: 0.03 ng/mL (ref 0.00–0.08)

## 2016-06-30 LAB — BRAIN NATRIURETIC PEPTIDE: B NATRIURETIC PEPTIDE 5: 67.4 pg/mL (ref 0.0–100.0)

## 2016-06-30 MED ORDER — LEVOFLOXACIN 750 MG PO TABS: 750.0000 mg | ORAL_TABLET | Freq: Every day | ORAL | 0 refills | Status: AC

## 2016-06-30 MED ORDER — PREDNISONE 20 MG PO TABS
60.0000 mg | ORAL_TABLET | Freq: Once | ORAL | Status: AC
  Administered 2016-06-30: 60 mg via ORAL
  Filled 2016-06-30: qty 3

## 2016-06-30 MED ORDER — LEVOFLOXACIN 750 MG PO TABS
750.0000 mg | ORAL_TABLET | Freq: Once | ORAL | Status: AC
  Administered 2016-06-30: 750 mg via ORAL
  Filled 2016-06-30: qty 1

## 2016-06-30 MED ORDER — IPRATROPIUM-ALBUTEROL 0.5-2.5 (3) MG/3ML IN SOLN
3.0000 mL | Freq: Once | RESPIRATORY_TRACT | Status: AC
  Administered 2016-06-30: 3 mL via RESPIRATORY_TRACT
  Filled 2016-06-30: qty 3

## 2016-06-30 MED ORDER — PREDNISONE 10 MG PO TABS: ORAL_TABLET | ORAL | 0 refills | Status: DC

## 2016-06-30 NOTE — ED Notes (Signed)
Patient transported to X-ray 

## 2016-06-30 NOTE — ED Notes (Signed)
[facility] called  

## 2016-06-30 NOTE — ED Notes (Signed)
Bed: WA18 Expected date:  Expected time:  Means of arrival:  Comments: EMS-COPD 

## 2016-06-30 NOTE — ED Provider Notes (Signed)
[facility]-EMERGENCY DEPT Provider Note   CSN: [id number] Arrival date & time: 06/30/16  1846     History   Chief Complaint No chief complaint on file.   HPI Greg Jones is a 83 y.o. male.  The history is provided by the patient.  Shortness of Breath  This is a recurrent problem. The average episode lasts 2 days. The problem occurs intermittently.The current episode started 2 days ago. The problem has been gradually worsening. Associated symptoms include cough, sputum production and wheezing. Pertinent negatives include no fever, no hemoptysis, no orthopnea, no chest pain, no abdominal pain and no leg swelling. He has tried nothing for the symptoms. He has had prior hospitalizations. Associated medical issues include COPD.    Past Medical History:  Diagnosis Date  . CHF (congestive heart failure) (HCC)   . Chronic kidney disease   . COPD (chronic obstructive pulmonary disease) (HCC)   . GERD (gastroesophageal reflux disease)   . Hypertension   . Respiratory insufficiency     Patient Active Problem List   Diagnosis Date Noted  . Pressure injury of skin 04/12/2016  . Near syncope 04/11/2016  . Pre-syncope 04/11/2016  . CKD (chronic kidney disease), stage IV (HCC) 04/11/2016  . QT prolongation 04/11/2016  . Leukocytosis 04/11/2016  . Hypokalemia 04/11/2016  . Blood glucose elevated 04/11/2016  . Acute on chronic left systolic heart failure (HCC) 03/15/2016  . Chronic combined systolic and diastolic CHF (congestive heart failure) (HCC) 01/11/2016  . Dyspnea   . COPD exacerbation (HCC) 09/07/2015  . GERD (gastroesophageal reflux disease) 05/28/2015  . COPD (chronic obstructive pulmonary disease) (HCC) 05/10/2015  . Essential hypertension 05/10/2015    No past surgical history on file.     Home Medications    Prior to Admission medications   Medication Sig Start Date End Date Taking? Authorizing Provider  albuterol (PROVENTIL HFA;VENTOLIN HFA) 108 (90 Base) MCG/ACT  inhaler Inhale 1-2 puffs into the lungs every 6 (six) hours as needed for wheezing or shortness of breath.   Yes Historical Provider, MD  albuterol (PROVENTIL) (2.5 MG/3ML) 0.083% nebulizer solution Take 3 mLs (2.5 mg total) by nebulization every 6 (six) hours as needed for wheezing or shortness of breath. Reported on 07/14/2015 02/15/16  Yes Tammy S Parrett, NP  amLODipine (NORVASC) 10 MG tablet Take 10 mg by mouth daily.   Yes Historical Provider, MD  aspirin EC 81 MG tablet Take 81 mg by mouth daily.   Yes Historical Provider, MD  budesonide-formoterol (SYMBICORT) 160-4.5 MCG/ACT inhaler Inhale 2 puffs into the lungs 2 (two) times daily.   Yes Historical Provider, MD  cholecalciferol (VITAMIN D) 400 UNITS TABS tablet Take 400 Units by mouth daily.    Yes Historical Provider, MD  furosemide (LASIX) 20 MG tablet Take 4 tablets (80 mg total) by mouth daily. 04/19/16  Yes Kathlen Mody, MD  isosorbide mononitrate (IMDUR) 30 MG 24 hr tablet Take 30 mg by mouth daily.   Yes Historical Provider, MD  metolazone (ZAROXOLYN) 5 MG tablet Take 1 tablet (5 mg total) by mouth daily. 04/19/16  Yes Kathlen Mody, MD  potassium chloride (K-DUR) 10 MEQ tablet Take 10 mEq by mouth daily.   Yes Historical Provider, MD  predniSONE (DELTASONE) 10 MG tablet TAKE ONE TABLET BY MOUTH ONCE DAILY WITH BREAKFAST 04/28/16  Yes Oretha Milch, MD  tiotropium (SPIRIVA) 18 MCG inhalation capsule Place 18 mcg into inhaler and inhale daily.   Yes Historical Provider, MD  amLODipine (NORVASC) 5 MG tablet Take  1 tablet (5 mg total) by mouth daily. Patient not taking: Reported on 06/30/2016 09/14/15   Simonne Martinet, NP  Emollient (AQUAPHOR EX) Apply 1 application topically daily as needed (dry skin. (hands)).    Historical Provider, MD  predniSONE (DELTASONE) 10 MG tablet Take 2 tabs daily for 5 days, then 1 tab daily with food Patient not taking: Reported on 06/30/2016 05/01/16   Oretha Milch, MD    Family History Family History    Problem Relation Age of Onset  . Tuberculosis Mother   . Diabetes Father     Social History Social History  Substance Use Topics  . Smoking status: Former Smoker    Packs/day: 0.50    Years: 70.00    Types: Cigarettes    Quit date: 04/19/2013  . Smokeless tobacco: Never Used  . Alcohol use No     Allergies   Other; Penicillins; and Vitamin b12   Review of Systems Review of Systems  Constitutional: Negative for fever.  Respiratory: Positive for cough, sputum production, shortness of breath and wheezing. Negative for hemoptysis.   Cardiovascular: Negative for chest pain, orthopnea and leg swelling.  Gastrointestinal: Negative for abdominal pain.  All other systems reviewed and are negative.    Physical Exam Updated Vital Signs BP 153/70 (BP Location: Left Arm)   Pulse 111   Temp 98.2 F (36.8 C) (Oral)   Resp 19   SpO2 99% Comment: 2L  Physical Exam  Constitutional: He is oriented to person, place, and time. He appears well-developed and well-nourished. No distress.  HENT:  Head: Normocephalic and atraumatic.  Nose: Nose normal.  Eyes: Conjunctivae are normal.  Neck: Neck supple. No tracheal deviation present.  Cardiovascular: Normal rate, regular rhythm and normal heart sounds.   Pulmonary/Chest: Effort normal. No accessory muscle usage. No tachypnea. No respiratory distress. He has no decreased breath sounds. He has wheezes (diffuse expiratory). He has no rales.  Prolonged exhalation  Abdominal: Soft. He exhibits no distension.  Musculoskeletal:  0 b/l LE edema  Neurological: He is alert and oriented to person, place, and time.  Skin: Skin is warm and dry.  Psychiatric: He has a normal mood and affect.     ED Treatments / Results  Labs (all labs ordered are listed, but only abnormal results are displayed) Labs Reviewed  CBC WITH DIFFERENTIAL/PLATELET - Abnormal; Notable for the following:       Result Value   WBC 13.2 (*)    Hemoglobin 12.8 (*)     HCT 38.8 (*)    RDW 16.5 (*)    Neutro Abs 12.3 (*)    All other components within normal limits  BASIC METABOLIC PANEL - Abnormal; Notable for the following:    Potassium 2.8 (*)    Chloride 91 (*)    Glucose, Bld 231 (*)    BUN 39 (*)    Creatinine, Ser 2.26 (*)    GFR calc non Af Amer 25 (*)    GFR calc Af Amer 29 (*)    Anion gap 16 (*)    All other components within normal limits  BRAIN NATRIURETIC PEPTIDE  I-STAT TROPOININ, ED    EKG  EKG Interpretation  Date/Time:  Friday June 30 2016 18:56:21 EST Ventricular Rate:  107 PR Interval:    QRS Duration: 76 QT Interval:  342 QTC Calculation: 457 R Axis:   -54 Text Interpretation:  Sinus tachycardia Atrial premature complex Right atrial enlargement Left anterior fascicular block Borderline T abnormalities,  lateral leads Since last tracing rate faster Otherwise no significant change Confirmed by Jacson Rapaport MD, Teyana Pierron [phone]([id number]) on 06/30/2016 10:16:16 PM       Radiology Dg Chest 2 View  Result Date: 06/30/2016 CLINICAL DATA:  Shortness of breath.  History of COPD. EXAM: CHEST  2 VIEW COMPARISON:  04/11/2016 FINDINGS: Thoracic spondylosis. Cardiac and mediastinal margins appear normal. The lungs appear clear. Tapering of the peripheral pulmonary vasculature favors emphysema. IMPRESSION: 1. Emphysema. 2. No acute findings. Electronically Signed   By: Gaylyn RongWalter  Liebkemann M.D.   On: 06/30/2016 20:30    Procedures Procedures (including critical care time)  Medications Ordered in ED Medications  ipratropium-albuterol (DUONEB) 0.5-2.5 (3) MG/3ML nebulizer solution 3 mL (3 mLs Nebulization Given 06/30/16 2015)  predniSONE (DELTASONE) tablet 60 mg (60 mg Oral Given 06/30/16 2034)  levofloxacin (LEVAQUIN) tablet 750 mg (750 mg Oral Given 06/30/16 2301)     Initial Impression / Assessment and Plan / ED Course  I have reviewed the triage vital signs and the nursing notes.  Pertinent labs & imaging results that were available during my  care of the patient were reviewed by me and considered in my medical decision making (see chart for details).  Clinical Course     83 y.o. male presents with shortness of breath over the last 2 days. No new O2 requirement. Has diffuse wheezing on exam and prolonged exhalation requiring more frequent home breathing treatments. Has had productive yellow sputum. CXR negative for consolidative pneumonia. On chronic prednisone, will place on burst followed by taper until returning to chronic dose and levaquin added for coverage of atypicals. No indication for inpatient admission currently. Labs and appearance reassuring. Do not suspect CHF or volume overload. Plan to follow up with PCP as needed and return precautions discussed for worsening or new concerning symptoms.   Final Clinical Impressions(s) / ED Diagnoses   Final diagnoses:  COPD with acute exacerbation [facility](HCC)    New Prescriptions Discharge Medication List as of 06/30/2016 10:39 PM    START taking these medications   Details  levofloxacin (LEVAQUIN) 750 MG tablet Take 1 tablet (750 mg total) by mouth daily., Starting Sat 07/01/2016, Until Wed 07/05/2016, Print    !! predniSONE (DELTASONE) 10 MG tablet Take 5 tablets daily for 4 days, followed by 4 tablets for 2 days, followed by 3 tablets for 2 days, followed by 2 tablets for 2 days, followed by 1 tablet for 2 days for a total of 40 tablets over 12 days, Print     !! - Potential duplicate medications found. Please discuss with provider.       Lyndal Pulleyaniel Bayler Nehring, MD 07/01/16 [phone]

## 2016-06-30 NOTE — ED Triage Notes (Signed)
Per EMS, pt is coming from home with SOB. Pt has a hx of COPD. Pt used home nebulizer with no relief. Pt received a total of 10 mg albuterol, 0.5 mg atrovent, 125 mg solu-medrol. EMS reports that pt had labored breathing and retractions upon arrival.

## 2016-06-30 NOTE — ED Notes (Signed)
Tried x2 to get blood, not successful

## 2016-07-05 ENCOUNTER — Ambulatory Visit: Payer: Medicare Other | Admitting: Pulmonary Disease

## 2016-07-06 ENCOUNTER — Ambulatory Visit: Payer: Medicare Other | Admitting: Adult Health

## 2016-07-18 ENCOUNTER — Ambulatory Visit: Payer: Medicare Other | Attending: Gastroenterology | Admitting: *Deleted

## 2016-07-18 DIAGNOSIS — R1314 Dysphagia, pharyngoesophageal phase: Secondary | ICD-10-CM | POA: Insufficient documentation

## 2016-07-18 NOTE — Therapy (Signed)
[facility] [address] [place], [place], [address] Phone: [phone]   Fax:  [phone]  Speech Language Pathology Evaluation  Patient Details  Name: Greg RobertsJonathan Labell MRN: [id number] Date of Birth: 06/12/1928 No Data Recorded  Encounter Date: 07/18/2016      End of Session - 07/18/16 1400    Visit Number 1   Number of Visits 1   SLP Start Time 1315   SLP Stop Time  1340   SLP Time Calculation (min) 25 min   Activity Tolerance Patient tolerated treatment well      Past Medical History:  Diagnosis Date  . CHF (congestive heart failure) (HCC)   . Chronic kidney disease   . COPD (chronic obstructive pulmonary disease) (HCC)   . GERD (gastroesophageal reflux disease)   . Hypertension   . Respiratory insufficiency     No past surgical history on file.  There were no vitals filed for this visit.      Subjective Assessment - 07/18/16 1350    Subjective Pt seen for clinical swallow evaluation. Sister present   Patient is accompained by: Family member  sister   Special Tests Upper GI - 05/08/16 = severe esophageal dysmotility, spontaneous GERD to hypopharynx, prominent tertiary contractions, no narrowing or stricture.   CXR 07/09/16 - COPD, emphysema   Currently in Pain? No/denies            SLP Evaluation [facility] - 07/18/16 1350      SLP Visit Information   SLP Received On 07/18/16   Referring Provider --  Outlaw   Medical Diagnosis --  COPD, emphysema, esophageal dysmotility     Subjective   Subjective Pt seen for clinical swallow evaluation. Sister in attendance   Patient/Family Stated Goal none stated     General Information   HPI 83 year old male referred for clinical swallow evaluation following upper GI in November 2017, which revealed severe esophageal dysmotility with GERD to hypopharynx. PMH significant for CHF, HTN, GERD, respiratory insufficiency, COPD, emphysema.    Behavioral/Cognition WFL for this assessment   Mobility Status ambulatory with walker     Prior Functional Status   Cognitive/Linguistic Baseline Within functional limits    Lives With Family     Cognition   Overall Cognitive Status Within Functional Limits for tasks assessed     Auditory Comprehension   Overall Auditory Comprehension Appears within functional limits for tasks assessed     Verbal Expression   Overall Verbal Expression Appears within functional limits for tasks assessed     Oral Motor/Sensory Function   Overall Oral Motor/Sensory Function Appears within functional limits for tasks assessed     Motor Speech   Overall Motor Speech Appears within functional limits for tasks assessed         Clinical Swallow Study - 07/18/16 1350      Baseline Assessment   Temperature Spikes Noted No   Respiratory Status Room air   History of Recent Intubation No   Behavior/Cognition Alert;Cooperative;Pleasant mood   Oral Cavity - Dentition Adequate natural dentition   Vision Functional for self-feeding   Patient Positioning Upright in chair   Baseline Vocal Quality Hoarse  slight   Volitional Cough Strong   Volitional Swallow Able to elicit     Consistencies Assessed   Consistencies Assessed Yes     Thin Liquid   Thin Liquid Within functional limits     Puree   Puree Within functional limits  Solid   Solid Within functional limits     Aspiration Risk   Risk for Aspiration Mild   Diet Solids Recommendation General consistency   Diet Liquids Recommendations No liquid consistency restrictions   Compensatory Swallowing Strategies Upright as possible for all oral intake;Remain upright for 20-30 minutes after meals;Alternate solids and liquids;Small bites/sips;Eat/feed slowly   Recommended Form of Medications With liquid                        SLP Education - 26-Jul-2016 1359    Education provided Yes   Education Details Behavioral and dietary  strategies for management of esophageal dysmotility   Person(s) Educated Patient;Other (comment)  sister   Methods Explanation;Demonstration;Handout   Comprehension Verbalized understanding              Plan - 26-Jul-2016 1513    Clinical Impression Statement Pt presents with adequate oral motor strength and function. Adequate dentition. Cranial nerve exam is unremarkable, with the exception of hoarseness. This is more likely due to reflux to the hypopharynx, as documented on upper GI report, rather than CN X damage. Pt reports having a very dry mouth, and uses moisturizer as needed. Pt was presented with trials of thin liquid, puree, and solid. No overt s/s aspiration observed or reported on any consistency. Pt's sister did report that pt has lost ~24 pounds since August 2017 (Of note, pt became a widower in June 2017, which may have some correlation to his weight loss). Pt was encouraged to notify MD for possible dietician consult if poor appetite/weight loss continues. SLP reviewed behavioral and dietary strategies for managing esophageal dysmotility, and provided written information to pt/sister. Given primary esophageal dysphagia, further ST intervention is not recommended at this time. SLP available if needs arise in the future.   Speech Therapy Frequency One time visit   SLP Home Exercise Plan written strategies for management of esophageal dysmotility   Consulted and Agree with Plan of Care Patient;Family member/caregiver   Family Member Consulted sister      Patient will benefit from skilled therapeutic intervention in order to improve the following deficits and impairments:   Dysphagia, pharyngoesophageal phase      G-Codes - 07-26-2016 1523    Functional Assessment Tool Used asha noms, clinical judgment, BSE   Functional Limitations Swallowing   Swallow Current Status (U9811) At least 1 percent but less than 20 percent impaired, limited or restricted   Swallow Goal Status  (B1478) At least 1 percent but less than 20 percent impaired, limited or restricted   Swallow Discharge Status [phone]) At least 1 percent but less than 20 percent impaired, limited or restricted      Problem List Patient Active Problem List   Diagnosis Date Noted  . Pressure injury of skin 04/12/2016  . Near syncope 04/11/2016  . Pre-syncope 04/11/2016  . CKD (chronic kidney disease), stage IV (HCC) 04/11/2016  . QT prolongation 04/11/2016  . Leukocytosis 04/11/2016  . Hypokalemia 04/11/2016  . Blood glucose elevated 04/11/2016  . Acute on chronic left systolic heart failure (HCC) 03/15/2016  . Chronic combined systolic and diastolic CHF (congestive heart failure) (HCC) 01/11/2016  . Dyspnea   . COPD exacerbation (HCC) 09/07/2015  . GERD (gastroesophageal reflux disease) 05/28/2015  . COPD (chronic obstructive pulmonary disease) (HCC) 05/10/2015  . Essential hypertension 05/10/2015   Devarious Pavek B. Dean, MSP, CCC-SLP  Leigh Aurora Jul 26, 2016, 3:24 PM  [facility] [address]  [address] [place], [place], [address] Phone: [phone]   Fax:  [phone]  Name: Layton Naves MRN: [id number] Date of Birth: Apr 19, 1928

## 2016-09-07 ENCOUNTER — Inpatient Hospital Stay (HOSPITAL_COMMUNITY): Payer: Medicare Other

## 2016-09-07 ENCOUNTER — Inpatient Hospital Stay (HOSPITAL_COMMUNITY)
Admission: AD | Admit: 2016-09-07 | Discharge: 2016-09-11 | DRG: 291 | Disposition: A | Discharge status: Home / Self Care | Payer: Medicare Other | Source: Ambulatory Visit | Attending: Cardiovascular Disease | Admitting: Cardiovascular Disease

## 2016-09-07 DIAGNOSIS — I5030 Unspecified diastolic (congestive) heart failure: Secondary | ICD-10-CM | POA: Diagnosis present

## 2016-09-07 DIAGNOSIS — I130 Hypertensive heart and chronic kidney disease with heart failure and stage 1 through stage 4 chronic kidney disease, or unspecified chronic kidney disease: Secondary | ICD-10-CM | POA: Diagnosis present

## 2016-09-07 DIAGNOSIS — Z7982 Long term (current) use of aspirin: Secondary | ICD-10-CM | POA: Diagnosis not present

## 2016-09-07 DIAGNOSIS — I5033 Acute on chronic diastolic (congestive) heart failure: Secondary | ICD-10-CM | POA: Diagnosis present

## 2016-09-07 DIAGNOSIS — Z880 Allergy status to penicillin: Secondary | ICD-10-CM

## 2016-09-07 DIAGNOSIS — I509 Heart failure, unspecified: Secondary | ICD-10-CM

## 2016-09-07 DIAGNOSIS — I350 Nonrheumatic aortic (valve) stenosis: Secondary | ICD-10-CM | POA: Diagnosis present

## 2016-09-07 DIAGNOSIS — Z888 Allergy status to other drugs, medicaments and biological substances status: Secondary | ICD-10-CM

## 2016-09-07 DIAGNOSIS — J441 Chronic obstructive pulmonary disease with (acute) exacerbation: Secondary | ICD-10-CM | POA: Diagnosis present

## 2016-09-07 DIAGNOSIS — K219 Gastro-esophageal reflux disease without esophagitis: Secondary | ICD-10-CM | POA: Diagnosis present

## 2016-09-07 DIAGNOSIS — E669 Obesity, unspecified: Secondary | ICD-10-CM | POA: Diagnosis present

## 2016-09-07 DIAGNOSIS — N183 Chronic kidney disease, stage 3 (moderate): Secondary | ICD-10-CM | POA: Diagnosis present

## 2016-09-07 DIAGNOSIS — Z6829 Body mass index (BMI) 29.0-29.9, adult: Secondary | ICD-10-CM

## 2016-09-07 DIAGNOSIS — I5023 Acute on chronic systolic (congestive) heart failure: Secondary | ICD-10-CM | POA: Diagnosis present

## 2016-09-07 DIAGNOSIS — R0602 Shortness of breath: Secondary | ICD-10-CM | POA: Diagnosis present

## 2016-09-07 DIAGNOSIS — Z7951 Long term (current) use of inhaled steroids: Secondary | ICD-10-CM | POA: Diagnosis not present

## 2016-09-07 DIAGNOSIS — Z79899 Other long term (current) drug therapy: Secondary | ICD-10-CM

## 2016-09-07 DIAGNOSIS — Z87891 Personal history of nicotine dependence: Secondary | ICD-10-CM | POA: Diagnosis not present

## 2016-09-07 LAB — CBC WITH DIFFERENTIAL/PLATELET
BASOS PCT: 0 %
Basophils Absolute: 0 10*3/uL (ref 0.0–0.1)
Eosinophils Absolute: 0 10*3/uL (ref 0.0–0.7)
Eosinophils Relative: 0 %
HEMATOCRIT: 39.1 % (ref 39.0–52.0)
Hemoglobin: 12.3 g/dL — ABNORMAL LOW (ref 13.0–17.0)
LYMPHS PCT: 7 %
Lymphs Abs: 0.9 10*3/uL (ref 0.7–4.0)
MCH: 26.7 pg (ref 26.0–34.0)
MCHC: 31.5 g/dL (ref 30.0–36.0)
MCV: 85 fL (ref 78.0–100.0)
MONO ABS: 0.5 10*3/uL (ref 0.1–1.0)
MONOS PCT: 4 %
NEUTROS ABS: 11.1 10*3/uL — AB (ref 1.7–7.7)
Neutrophils Relative %: 89 %
Platelets: 261 10*3/uL (ref 150–400)
RBC: 4.6 MIL/uL (ref 4.22–5.81)
RDW: 18.7 % — AB (ref 11.5–15.5)
WBC: 12.6 10*3/uL — ABNORMAL HIGH (ref 4.0–10.5)

## 2016-09-07 LAB — COMPREHENSIVE METABOLIC PANEL
ALT: 34 U/L (ref 17–63)
ANION GAP: 10 (ref 5–15)
AST: 36 U/L (ref 15–41)
Albumin: 3.8 g/dL (ref 3.5–5.0)
Alkaline Phosphatase: 64 U/L (ref 38–126)
BILIRUBIN TOTAL: 0.6 mg/dL (ref 0.3–1.2)
BUN: 26 mg/dL — ABNORMAL HIGH (ref 6–20)
CO2: 24 mmol/L (ref 22–32)
Calcium: 9.3 mg/dL (ref 8.9–10.3)
Chloride: 102 mmol/L (ref 101–111)
Creatinine, Ser: 2.27 mg/dL — ABNORMAL HIGH (ref 0.61–1.24)
GFR, EST AFRICAN AMERICAN: 28 mL/min — AB (ref >60)
GFR, EST NON AFRICAN AMERICAN: 24 mL/min — AB (ref >60)
Glucose, Bld: 143 mg/dL — ABNORMAL HIGH (ref 65–99)
POTASSIUM: 4.5 mmol/L (ref 3.5–5.1)
Sodium: 136 mmol/L (ref 135–145)
TOTAL PROTEIN: 6.5 g/dL (ref 6.5–8.1)

## 2016-09-07 LAB — TROPONIN I: Troponin I: 0.03 ng/mL (ref <0.03)

## 2016-09-07 LAB — BRAIN NATRIURETIC PEPTIDE: B Natriuretic Peptide: 82.2 pg/mL (ref 0.0–100.0)

## 2016-09-07 MED ORDER — PANTOPRAZOLE SODIUM 40 MG PO TBEC
40.0000 mg | DELAYED_RELEASE_TABLET | Freq: Every day | ORAL | Status: DC
  Administered 2016-09-07 – 2016-09-11 (×5): 40 mg via ORAL
  Filled 2016-09-07 (×5): qty 1

## 2016-09-07 MED ORDER — SODIUM CHLORIDE 0.9% FLUSH: 3.0000 mL | INTRAVENOUS | Status: DC | PRN

## 2016-09-07 MED ORDER — SODIUM CHLORIDE 0.9 % IV SOLN: 250.0000 mL | INTRAVENOUS | Status: DC | PRN

## 2016-09-07 MED ORDER — ACETAMINOPHEN 325 MG PO TABS: 650.0000 mg | ORAL_TABLET | ORAL | Status: DC | PRN

## 2016-09-07 MED ORDER — FUROSEMIDE 10 MG/ML IJ SOLN
40.0000 mg | Freq: Two times a day (BID) | INTRAMUSCULAR | Status: DC
  Administered 2016-09-08: 40 mg via INTRAVENOUS
  Filled 2016-09-07 (×2): qty 4

## 2016-09-07 MED ORDER — ALBUTEROL SULFATE (2.5 MG/3ML) 0.083% IN NEBU
2.5000 mg | INHALATION_SOLUTION | Freq: Four times a day (QID) | RESPIRATORY_TRACT | Status: DC | PRN
  Administered 2016-09-09 – 2016-09-11 (×3): 2.5 mg via RESPIRATORY_TRACT
  Filled 2016-09-07 (×3): qty 3

## 2016-09-07 MED ORDER — MOMETASONE FURO-FORMOTEROL FUM 200-5 MCG/ACT IN AERO
2.0000 | INHALATION_SPRAY | Freq: Two times a day (BID) | RESPIRATORY_TRACT | Status: DC
  Administered 2016-09-07 – 2016-09-11 (×8): 2 via RESPIRATORY_TRACT
  Filled 2016-09-07: qty 8.8

## 2016-09-07 MED ORDER — FUROSEMIDE 10 MG/ML IJ SOLN
40.0000 mg | Freq: Once | INTRAMUSCULAR | Status: AC
  Administered 2016-09-07: 40 mg via INTRAVENOUS
  Filled 2016-09-07: qty 4

## 2016-09-07 MED ORDER — METOLAZONE 5 MG PO TABS
5.0000 mg | ORAL_TABLET | Freq: Every day | ORAL | Status: DC
  Administered 2016-09-08 – 2016-09-09 (×2): 5 mg via ORAL
  Filled 2016-09-07 (×2): qty 1

## 2016-09-07 MED ORDER — ASPIRIN EC 81 MG PO TBEC
81.0000 mg | DELAYED_RELEASE_TABLET | Freq: Every day | ORAL | Status: DC
  Administered 2016-09-07 – 2016-09-11 (×5): 81 mg via ORAL
  Filled 2016-09-07 (×5): qty 1

## 2016-09-07 MED ORDER — POTASSIUM CHLORIDE CRYS ER 20 MEQ PO TBCR
40.0000 meq | EXTENDED_RELEASE_TABLET | Freq: Two times a day (BID) | ORAL | Status: DC
  Administered 2016-09-07 – 2016-09-09 (×4): 40 meq via ORAL
  Filled 2016-09-07 (×4): qty 2

## 2016-09-07 MED ORDER — SODIUM CHLORIDE 0.9% FLUSH
3.0000 mL | Freq: Two times a day (BID) | INTRAVENOUS | Status: DC
  Administered 2016-09-07 – 2016-09-11 (×8): 3 mL via INTRAVENOUS

## 2016-09-07 MED ORDER — HEPARIN SODIUM (PORCINE) 5000 UNIT/ML IJ SOLN
5000.0000 [IU] | Freq: Three times a day (TID) | INTRAMUSCULAR | Status: DC
  Administered 2016-09-07 – 2016-09-11 (×11): 5000 [IU] via SUBCUTANEOUS
  Filled 2016-09-07 (×11): qty 1

## 2016-09-07 MED ORDER — AMLODIPINE BESYLATE 5 MG PO TABS
5.0000 mg | ORAL_TABLET | Freq: Every day | ORAL | Status: DC
  Administered 2016-09-07 – 2016-09-11 (×5): 5 mg via ORAL
  Filled 2016-09-07 (×5): qty 1

## 2016-09-07 MED ORDER — SODIUM CHLORIDE 0.9% FLUSH: 3.0000 mL | Freq: Two times a day (BID) | INTRAVENOUS | Status: DC

## 2016-09-07 NOTE — Progress Notes (Signed)
CRITICAL VALUE ALERT  Critical value received:  Troponin 0.03  Date of notification:  09/07/2016  Time of notification:  2031  Critical value read back: Yes  Nurse who received alert:  Petra KubaErica Claborn Janusz  MD notified (1st page):  Dr Algie CofferKadakia  Time of first page:  2101

## 2016-09-07 NOTE — H&P (Signed)
Referring Physician:  Cherlyn RobertsJonathan Vanderstelt is an 83 y.o. male.                       Chief Complaint: Shortness of breath and leg edema  HPI: 83 year old male with PMH of COPD, GERD, hypertension and CKD has shortness of breath, leg edema and 10 pound weight gain.  Past Medical History:  Diagnosis Date  . CHF (congestive heart failure) (HCC)   . Chronic kidney disease   . COPD (chronic obstructive pulmonary disease) (HCC)   . GERD (gastroesophageal reflux disease)   . Hypertension   . Respiratory insufficiency       No past surgical history on file.  Family History  Problem Relation Age of Onset  . Tuberculosis Mother   . Diabetes Father    Social History:  reports that he quit smoking about 3 years ago. His smoking use included Cigarettes. He has a 35.00 pack-year smoking history. He has never used smokeless tobacco. He reports that he does not drink alcohol or use drugs.  Allergies:  Allergies  Allergen Reactions  . Other Other (See Comments)    Oxygen Sensitive  . Penicillins Rash and Other (See Comments)    Any -illins as well as Mycins Has patient had a PCN reaction causing immediate rash, facial/tongue/throat swelling, SOB or lightheadedness with hypotension: Yes Has patient had a PCN reaction causing severe rash involving mucus membranes or skin necrosis: Yes Has patient had a PCN reaction that required hospitalization No Has patient had a PCN reaction occurring within the last 10 years: No If all of the above answers are "NO", then may proceed with Cephalosporin use.   . Vitamin B12 Itching, Rash and Other (See Comments)    injections    Medications Prior to Admission  Medication Sig Dispense Refill  . aspirin EC 81 MG tablet Take 81 mg by mouth daily.    . budesonide-formoterol (SYMBICORT) 160-4.5 MCG/ACT inhaler Inhale 2 puffs into the lungs 2 (two) times daily.    . furosemide (LASIX) 20 MG tablet Take 4 tablets (80 mg total) by mouth daily. (Patient taking  differently: Take 20 mg by mouth 2 (two) times daily. ) 30 tablet 3  . metolazone (ZAROXOLYN) 5 MG tablet Take 1 tablet (5 mg total) by mouth daily. (Patient taking differently: Take 5 mg by mouth every Monday, Wednesday, and Friday. As needed for leg edema)    . omeprazole (PRILOSEC) 20 MG capsule Take 20 mg by mouth daily.    . potassium chloride (K-DUR) 10 MEQ tablet Take 40 mEq by mouth daily.     . predniSONE (DELTASONE) 10 MG tablet TAKE ONE TABLET BY MOUTH ONCE DAILY WITH BREAKFAST 30 tablet 5  . albuterol (PROVENTIL) (2.5 MG/3ML) 0.083% nebulizer solution Take 3 mLs (2.5 mg total) by nebulization every 6 (six) hours as needed for wheezing or shortness of breath. Reported on 07/14/2015 (Patient not taking: Reported on 09/07/2016) 75 mL 5  . amLODipine (NORVASC) 5 MG tablet Take 1 tablet (5 mg total) by mouth daily. (Patient not taking: Reported on 06/30/2016) 30 tablet 6  . [DISCONTINUED] predniSONE (DELTASONE) 10 MG tablet Take 2 tabs daily for 5 days, then 1 tab daily with food (Patient not taking: Reported on 06/30/2016) 90 tablet 1  . [DISCONTINUED] predniSONE (DELTASONE) 10 MG tablet Take 5 tablets daily for 4 days, followed by 4 tablets for 2 days, followed by 3 tablets for 2 days, followed by 2 tablets for  2 days, followed by 1 tablet for 2 days for a total of 40 tablets over 12 days (Patient not taking: Reported on 09/07/2016) 40 tablet 0    No results found for this or any previous visit (from the past 48 hour(s)). No results found.  Review Of Systems Constitutional: No fever, chills , Positive weight gain. Eyes: No vision change, Wears glasses. No discharge or pain.. Ears: Positive hearing loss, No tinnitus. Respiratory: No asthma, positive COPD, pneumonias and shortness of breath. No hemoptysis. Cardiovascular: Positive chest pain, palpitation and leg edema. Gastrointestinal: No nausea, vomiting or diarrhea or constipation. No GI bleed. No hepatitis. Genitourinary: No dysuria,  hematuria or kidney stone. No incontinance. Neurological: No headache, stroke or seizures.  Psychiatry: No psych facility admission for anxiety, depression or suicide. No detox. Skin: No rash. Musculoskeletal: Positive joint pain, No fibromyalgia. Positive neck pain and back pain. Lymphadenopathy: No lymphadenopathy Hematology: No anemia or easy bruising.   Blood pressure (!) 160/81, pulse (!) 118, temperature 98.1 F (36.7 C), temperature source Oral, resp. rate (!) 22, height 5' 7.5" (1.715 m), weight 86.9 kg (191 lb 8 oz), SpO2 97 %. Body mass index is 29.55 kg/m. General appearance: alert, cooperative, appears stated age and no distress Head: Normocephalic, atraumatic. Eyes: Brown eyes, pink conjunctivae/corneas clear. PERRL, EOM's intact.  Neck: no adenopathy, no carotid bruit, + JVD, supple, symmetrical, trachea midline and thyroid not enlarged. Resp: Basal crackles to auscultation bilaterally Cardio: regular rate and rhythm, S1, S2 normal, III/VI systolic murmur, no click, rub or gallop GI: soft, distended, non-tender; bowel sounds normal; no masses,  no organomegaly Extremities: 2 + lower extremities edema, left more than right. Skin: Warm and dry. No rashes or lesions Neurologic: Alert and oriented X 3, normal strength and tone. Normal coordination and slow gait with walker use.  Assessment/Plan Acute on chronic left heart systolic failure COPD, exacerbation Hypertension CKD GERD Obesity  Admit/Lasix/Oxygen/R/O MI/Echocardiogram   Greg Rodriguez, MD  09/07/2016, 6:24 PM

## 2016-09-08 ENCOUNTER — Encounter (HOSPITAL_COMMUNITY): Payer: Self-pay

## 2016-09-08 ENCOUNTER — Inpatient Hospital Stay (HOSPITAL_COMMUNITY): Payer: Medicare Other

## 2016-09-08 LAB — ECHOCARDIOGRAM COMPLETE
HEIGHTINCHES: 67.5 in
WEIGHTICAEL: 3064 [oz_av]

## 2016-09-08 LAB — TROPONIN I
TROPONIN I: 0.03 ng/mL — AB (ref <0.03)
Troponin I: 0.03 ng/mL (ref <0.03)

## 2016-09-08 LAB — BASIC METABOLIC PANEL
ANION GAP: 13 (ref 5–15)
BUN: 23 mg/dL — AB (ref 6–20)
CHLORIDE: 99 mmol/L — AB (ref 101–111)
CO2: 28 mmol/L (ref 22–32)
Calcium: 9.4 mg/dL (ref 8.9–10.3)
Creatinine, Ser: 2.13 mg/dL — ABNORMAL HIGH (ref 0.61–1.24)
GFR, EST AFRICAN AMERICAN: 30 mL/min — AB (ref >60)
GFR, EST NON AFRICAN AMERICAN: 26 mL/min — AB (ref >60)
Glucose, Bld: 124 mg/dL — ABNORMAL HIGH (ref 65–99)
POTASSIUM: 4.4 mmol/L (ref 3.5–5.1)
SODIUM: 140 mmol/L (ref 135–145)

## 2016-09-08 MED ORDER — METHYLPREDNISOLONE SODIUM SUCC 125 MG IJ SOLR
80.0000 mg | Freq: Every day | INTRAMUSCULAR | Status: AC
  Administered 2016-09-08 – 2016-09-09 (×2): 80 mg via INTRAVENOUS
  Filled 2016-09-08 (×2): qty 2

## 2016-09-08 MED ORDER — FUROSEMIDE 40 MG PO TABS
40.0000 mg | ORAL_TABLET | Freq: Every day | ORAL | Status: DC
  Administered 2016-09-09 – 2016-09-11 (×3): 40 mg via ORAL
  Filled 2016-09-08 (×3): qty 1

## 2016-09-08 NOTE — Progress Notes (Addendum)
Pt has been in the chair most of the day, SOB on excertion, Urination in urinal with some stress incontinence, continuing Lasix PO starting tomorrow, with 80 of iv steriods, [facility]

## 2016-09-08 NOTE — Progress Notes (Signed)
  Echocardiogram 2D Echocardiogram has been performed.  Greg Jones 09/08/2016, 2:14 PM

## 2016-09-08 NOTE — Progress Notes (Signed)
Ref: Augustine Radar, MD   Subjective:  Little better. Shortness of breath continues. Mild LV systolic and moderate diastolic dysfunction. Mild aortic stenosis.   Objective:  Vital Signs in the last 24 hours: Temp:  [97.4 F (36.3 C)-97.7 F (36.5 C)] 97.5 F (36.4 C) (03/23 1434) Pulse Rate:  [83-97] 93 (03/23 1434) Cardiac Rhythm: Normal sinus rhythm (03/23 0807) Resp:  [18-21] 18 (03/23 0753) BP: (112-162)/(66-88) 112/76 (03/23 1434) SpO2:  [98 %-100 %] 98 % (03/23 1434)  Physical Exam: BP Readings from Last 1 Encounters:  09/08/16 112/76    Wt Readings from Last 1 Encounters:  09/07/16 86.9 kg (191 lb 8 oz)    Weight change:  Body mass index is 29.55 kg/m. HEENT: /AT, Eyes-Brown, PERL, EOMI, Conjunctiva-Pink, Sclera-Non-icteric Neck: No JVD, No bruit, Trachea midline. Lungs:  Decreased breath sounds, Bilateral. Cardiac:  Regular rhythm, normal S1 and S2, no S3. III/VI systolic murmur. Abdomen:  Soft, non-tender. BS present. Extremities:  1 + edema present. No cyanosis. Positive clubbing. CNS: AxOx3, Cranial nerves grossly intact, moves all 4 extremities.  Skin: Warm and dry.   Intake/Output from previous day: 03/22 0701 - 03/23 0700 In: -  Out: 675 [Urine:675]    Lab Results: BMET    Component Value Date/Time   NA 140 09/08/2016 0633   NA 136 09/07/2016 1831   NA 136 06/30/2016 2115   K 4.4 09/08/2016 0633   K 4.5 09/07/2016 1831   K 2.8 (L) 06/30/2016 2115   CL 99 (L) 09/08/2016 0633   CL 102 09/07/2016 1831   CL 91 (L) 06/30/2016 2115   CO2 28 09/08/2016 0633   CO2 24 09/07/2016 1831   CO2 29 06/30/2016 2115   GLUCOSE 124 (H) 09/08/2016 0633   GLUCOSE 143 (H) 09/07/2016 1831   GLUCOSE 231 (H) 06/30/2016 2115   BUN 23 (H) 09/08/2016 0633   BUN 26 (H) 09/07/2016 1831   BUN 39 (H) 06/30/2016 2115   CREATININE 2.13 (H) 09/08/2016 0633   CREATININE 2.27 (H) 09/07/2016 1831   CREATININE 2.26 (H) 06/30/2016 2115   CALCIUM 9.4 09/08/2016 0633   CALCIUM 9.3 09/07/2016 1831   CALCIUM 9.9 06/30/2016 2115   GFRNONAA 26 (L) 09/08/2016 0633   GFRNONAA 24 (L) 09/07/2016 1831   GFRNONAA 25 (L) 06/30/2016 2115   GFRAA 30 (L) 09/08/2016 0633   GFRAA 28 (L) 09/07/2016 1831   GFRAA 29 (L) 06/30/2016 2115   CBC    Component Value Date/Time   WBC 12.6 (H) 09/07/2016 1831   RBC 4.60 09/07/2016 1831   HGB 12.3 (L) 09/07/2016 1831   HCT 39.1 09/07/2016 1831   PLT 261 09/07/2016 1831   MCV 85.0 09/07/2016 1831   MCH 26.7 09/07/2016 1831   MCHC 31.5 09/07/2016 1831   RDW 18.7 (H) 09/07/2016 1831   LYMPHSABS 0.9 09/07/2016 1831   MONOABS 0.5 09/07/2016 1831   EOSABS 0.0 09/07/2016 1831   BASOSABS 0.0 09/07/2016 1831   HEPATIC Function Panel  Recent Labs  04/11/16 1433 09/07/16 1831  PROT 8.0 6.5   HEMOGLOBIN A1C No components found for: HGA1C,  MPG CARDIAC ENZYMES Lab Results  Component Value Date   TROPONINI 0.03 (HH) 09/08/2016   TROPONINI 0.03 (HH) 09/08/2016   TROPONINI 0.03 (HH) 09/07/2016   BNP No results for input(s): PROBNP in the last 8760 hours. TSH  Recent Labs  04/12/16 0526  TSH 1.905   CHOLESTEROL No results for input(s): CHOL in the last 8760 hours.  Scheduled Meds: .  amLODipine  5 mg Oral Daily  . aspirin EC  81 mg Oral Daily  . furosemide  40 mg Intravenous Q12H  . heparin  5,000 Units Subcutaneous Q8H  . metolazone  5 mg Oral Daily  . mometasone-formoterol  2 puff Inhalation BID  . pantoprazole  40 mg Oral Daily  . potassium chloride  40 mEq Oral BID  . sodium chloride flush  3 mL Intravenous Q12H   Continuous Infusions: PRN Meds:.sodium chloride, acetaminophen, albuterol, sodium chloride flush  Assessment/Plan: Acute on chronic diastolic left heart failure COPD exacerbation Hypertension CKD GERD Obesity  Discontinue IV lasix. Start IV solumedrol.   LOS: 1 day    Orpah CobbAjay Zaia Carre  MD  09/08/2016, 5:45 PM

## 2016-09-08 NOTE — Care Management Note (Signed)
Case Management Note  Patient Details  Name: Greg RobertsJonathan Jones MRN: [id number] Date of Birth: 12/09/1927  Subjective/Objective:                    Action/Plan: Patient known to me from previous admission. CM will continue to follow for DCP;  03/17/2016 -Patient recently moved to [place] from [place] after his spouse died and now he is living with his sister. He is not familiar with the area therefore his sister takes him to his apts and other errands. [place] [employer]); private insurance with [employer] with prescription drug coverage; [facility]; patient reports no problem getting his medication. He weigh himself daily and his sister cooks a heart health diet with low sodium; DME - rollater ( walker with seat); No needs identified. CM will continue to follow for DCP.  Expected Discharge Date:    possibly 09/12/2016              Expected Discharge Plan:  Home w Home Health Services  Discharge planning Services  CM Consult   Status of Service:  In process, will continue to follow  Reola MosherChandler, Ahmeer Tuman L, RN,MHA,BSN [phone] 09/08/2016, 1:46 PM

## 2016-09-09 LAB — BASIC METABOLIC PANEL
Anion gap: 13 (ref 5–15)
BUN: 34 mg/dL — AB (ref 6–20)
CALCIUM: 10 mg/dL (ref 8.9–10.3)
CO2: 27 mmol/L (ref 22–32)
Chloride: 95 mmol/L — ABNORMAL LOW (ref 101–111)
Creatinine, Ser: 2.81 mg/dL — ABNORMAL HIGH (ref 0.61–1.24)
GFR calc Af Amer: 22 mL/min — ABNORMAL LOW (ref >60)
GFR, EST NON AFRICAN AMERICAN: 19 mL/min — AB (ref >60)
GLUCOSE: 185 mg/dL — AB (ref 65–99)
Potassium: 5 mmol/L (ref 3.5–5.1)
Sodium: 135 mmol/L (ref 135–145)

## 2016-09-09 NOTE — Progress Notes (Signed)
Ref: Greg Jones, [name], MD   Subjective:  Decreasing leg edema. Shortness of breath continues.  Objective:  Vital Signs in the last 24 hours: Temp:  [97.4 F (36.3 C)-98.4 F (36.9 C)] 97.8 F (36.6 C) (03/24 1200) Pulse Rate:  [78-93] 78 (03/24 1200) Cardiac Rhythm: Normal sinus rhythm (03/24 0700) Resp:  [18] 18 (03/24 1200) BP: (112-158)/(59-92) 148/62 (03/24 1200) SpO2:  [95 %-99 %] 98 % (03/24 1200) Weight:  [85.2 kg (187 lb 12.8 oz)] 85.2 kg (187 lb 12.8 oz) (03/24 0534)  Physical Exam: BP Readings from Last 1 Encounters:  09/09/16 (!) 148/62    Wt Readings from Last 1 Encounters:  09/09/16 85.2 kg (187 lb 12.8 oz)    Weight change: -1.678 kg (-3 lb 11.2 oz) Body mass index is 28.98 kg/m. HEENT: [place]/AT, Eyes-Brown, PERL, EOMI, Conjunctiva-Pink, Sclera-Non-icteric Neck: No JVD, No bruit, Trachea midline. Lungs:  Decreased breath sounds, Bilateral. Cardiac:  Regular rhythm, normal S1 and S2, no S3. III/VI systolic murmur. Abdomen:  Soft, non-tender. BS present. Extremities:  Trace edema present. No cyanosis. No clubbing. CNS: AxOx3, Cranial nerves grossly intact, moves all 4 extremities.  Skin: Warm and dry.   Intake/Output from previous day: 03/23 0701 - 03/24 0700 In: 1080 [P.O.:1080] Out: 2150 [Urine:2150]    Lab Results: BMET    Component Value Date/Time   NA 135 09/09/2016 0433   NA 140 09/08/2016 0633   NA 136 09/07/2016 1831   K 5.0 09/09/2016 0433   K 4.4 09/08/2016 0633   K 4.5 09/07/2016 1831   CL 95 (L) 09/09/2016 0433   CL 99 (L) 09/08/2016 0633   CL 102 09/07/2016 1831   CO2 27 09/09/2016 0433   CO2 28 09/08/2016 0633   CO2 24 09/07/2016 1831   GLUCOSE 185 (H) 09/09/2016 0433   GLUCOSE 124 (H) 09/08/2016 0633   GLUCOSE 143 (H) 09/07/2016 1831   BUN 34 (H) 09/09/2016 0433   BUN 23 (H) 09/08/2016 0633   BUN 26 (H) 09/07/2016 1831   CREATININE 2.81 (H) 09/09/2016 0433   CREATININE 2.13 (H) 09/08/2016 0633   CREATININE 2.27 (H)  09/07/2016 1831   CALCIUM 10.0 09/09/2016 0433   CALCIUM 9.4 09/08/2016 0633   CALCIUM 9.3 09/07/2016 1831   GFRNONAA 19 (L) 09/09/2016 0433   GFRNONAA 26 (L) 09/08/2016 0633   GFRNONAA 24 (L) 09/07/2016 1831   GFRAA 22 (L) 09/09/2016 0433   GFRAA 30 (L) 09/08/2016 0633   GFRAA 28 (L) 09/07/2016 1831   CBC    Component Value Date/Time   WBC 12.6 (H) 09/07/2016 1831   RBC 4.60 09/07/2016 1831   HGB 12.3 (L) 09/07/2016 1831   HCT 39.1 09/07/2016 1831   PLT 261 09/07/2016 1831   MCV 85.0 09/07/2016 1831   MCH 26.7 09/07/2016 1831   MCHC 31.5 09/07/2016 1831   RDW 18.7 (H) 09/07/2016 1831   LYMPHSABS 0.9 09/07/2016 1831   MONOABS 0.5 09/07/2016 1831   EOSABS 0.0 09/07/2016 1831   BASOSABS 0.0 09/07/2016 1831   HEPATIC Function Panel  Recent Labs  04/11/16 1433 09/07/16 1831  PROT 8.0 6.5   HEMOGLOBIN A1C No components found for: HGA1C,  MPG CARDIAC ENZYMES Lab Results  Component Value Date   TROPONINI 0.03 (HH) 09/08/2016   TROPONINI 0.03 (HH) 09/08/2016   TROPONINI 0.03 (HH) 09/07/2016   BNP No results for input(s): PROBNP in the last 8760 hours. TSH  Recent Labs  04/12/16 0526  TSH 1.905   CHOLESTEROL No results for  input(s): CHOL in the last 8760 hours.  Scheduled Meds: . amLODipine  5 mg Oral Daily  . aspirin EC  81 mg Oral Daily  . furosemide  40 mg Oral Daily  . heparin  5,000 Units Subcutaneous Q8H  . mometasone-formoterol  2 puff Inhalation BID  . pantoprazole  40 mg Oral Daily  . sodium chloride flush  3 mL Intravenous Q12H   Continuous Infusions: PRN Meds:.sodium chloride, acetaminophen, albuterol, sodium chloride flush  Assessment/Plan: Acute on chronic diastolic left heart failure Acute exacerbation of COPD Hypertension CKD, III GERD Obesity  Continue medial treatment. Increase activity as tolerated.   LOS: 2 days    Orpah Cobb  MD  09/09/2016, 12:50 PM

## 2016-09-10 LAB — BASIC METABOLIC PANEL
Anion gap: 14 (ref 5–15)
BUN: 47 mg/dL — ABNORMAL HIGH (ref 6–20)
CHLORIDE: 94 mmol/L — AB (ref 101–111)
CO2: 27 mmol/L (ref 22–32)
Calcium: 9.9 mg/dL (ref 8.9–10.3)
Creatinine, Ser: 2.61 mg/dL — ABNORMAL HIGH (ref 0.61–1.24)
GFR calc non Af Amer: 21 mL/min — ABNORMAL LOW (ref >60)
GFR, EST AFRICAN AMERICAN: 24 mL/min — AB (ref >60)
Glucose, Bld: 175 mg/dL — ABNORMAL HIGH (ref 65–99)
POTASSIUM: 4.2 mmol/L (ref 3.5–5.1)
SODIUM: 135 mmol/L (ref 135–145)

## 2016-09-10 MED ORDER — PREDNISONE 20 MG PO TABS
20.0000 mg | ORAL_TABLET | Freq: Every day | ORAL | Status: DC
  Administered 2016-09-10 – 2016-09-11 (×2): 20 mg via ORAL
  Filled 2016-09-10 (×2): qty 1

## 2016-09-10 NOTE — Plan of Care (Signed)
Problem: Activity: Goal: Capacity to carry out activities will improve Outcome: Progressing Patient's SOB has decreased from admission baseline. Intermittent SOB reported by patient with exertion. No SOB at rest. Patient is diuresing well.

## 2016-09-10 NOTE — Progress Notes (Signed)
Pt eating breakfast notifiably wheezing Respiratory made aware.

## 2016-09-10 NOTE — Progress Notes (Signed)
Ref: Augustine Radar, MD   Subjective:  Shortness of breath with wheezing continues. Decreasing leg edema. Afebrile.  Objective:  Vital Signs in the last 24 hours: Temp:  [97.8 F (36.6 C)-98.5 F (36.9 C)] 98.5 F (36.9 C) (03/25 0516) Pulse Rate:  [78-80] 78 (03/25 0516) Cardiac Rhythm: Normal sinus rhythm (03/25 0700) Resp:  [17-18] 18 (03/25 0516) BP: (135-156)/(62-92) 154/92 (03/25 1050) SpO2:  [93 %-99 %] 93 % (03/25 1050) Weight:  [85 kg (187 lb 4.8 oz)] 85 kg (187 lb 4.8 oz) (03/25 0516)  Physical Exam: BP Readings from Last 1 Encounters:  09/10/16 (!) 154/92    Wt Readings from Last 1 Encounters:  09/10/16 85 kg (187 lb 4.8 oz)    Weight change: -0.227 kg (-8 oz) Body mass index is 28.9 kg/m. HEENT: [place]/AT, Eyes-Brown, PERL, EOMI, Conjunctiva-Pink, Sclera-Non-icteric Neck: No JVD, No bruit, Trachea midline. Lungs:  Decreased breath sounds, Bilateral. Cardiac:  Regular rhythm, normal S1 and S2, no S3. II/VI systolic murmur. Abdomen:  Soft, non-tender. BS present. Extremities:  Trace edema present. No cyanosis. No clubbing. CNS: AxOx3, Cranial nerves grossly intact, moves all 4 extremities.  Skin: Warm and dry.   Intake/Output from previous day: 03/24 0701 - 03/25 0700 In: 960 [P.O.:960] Out: 1750 [Urine:1750]    Lab Results: BMET    Component Value Date/Time   NA 135 09/10/2016 0512   NA 135 09/09/2016 0433   NA 140 09/08/2016 0633   K 4.2 09/10/2016 0512   K 5.0 09/09/2016 0433   K 4.4 09/08/2016 0633   CL 94 (L) 09/10/2016 0512   CL 95 (L) 09/09/2016 0433   CL 99 (L) 09/08/2016 0633   CO2 27 09/10/2016 0512   CO2 27 09/09/2016 0433   CO2 28 09/08/2016 0633   GLUCOSE 175 (H) 09/10/2016 0512   GLUCOSE 185 (H) 09/09/2016 0433   GLUCOSE 124 (H) 09/08/2016 0633   BUN 47 (H) 09/10/2016 0512   BUN 34 (H) 09/09/2016 0433   BUN 23 (H) 09/08/2016 0633   CREATININE 2.61 (H) 09/10/2016 0512   CREATININE 2.81 (H) 09/09/2016 0433   CREATININE 2.13 (H)  09/08/2016 0633   CALCIUM 9.9 09/10/2016 0512   CALCIUM 10.0 09/09/2016 0433   CALCIUM 9.4 09/08/2016 0633   GFRNONAA 21 (L) 09/10/2016 0512   GFRNONAA 19 (L) 09/09/2016 0433   GFRNONAA 26 (L) 09/08/2016 0633   GFRAA 24 (L) 09/10/2016 0512   GFRAA 22 (L) 09/09/2016 0433   GFRAA 30 (L) 09/08/2016 0633   CBC    Component Value Date/Time   WBC 12.6 (H) 09/07/2016 1831   RBC 4.60 09/07/2016 1831   HGB 12.3 (L) 09/07/2016 1831   HCT 39.1 09/07/2016 1831   PLT 261 09/07/2016 1831   MCV 85.0 09/07/2016 1831   MCH 26.7 09/07/2016 1831   MCHC 31.5 09/07/2016 1831   RDW 18.7 (H) 09/07/2016 1831   LYMPHSABS 0.9 09/07/2016 1831   MONOABS 0.5 09/07/2016 1831   EOSABS 0.0 09/07/2016 1831   BASOSABS 0.0 09/07/2016 1831   HEPATIC Function Panel  Recent Labs  04/11/16 1433 09/07/16 1831  PROT 8.0 6.5   HEMOGLOBIN A1C No components found for: HGA1C,  MPG CARDIAC ENZYMES Lab Results  Component Value Date   TROPONINI 0.03 (HH) 09/08/2016   TROPONINI 0.03 (HH) 09/08/2016   TROPONINI 0.03 (HH) 09/07/2016   BNP No results for input(s): PROBNP in the last 8760 hours. TSH  Recent Labs  04/12/16 0526  TSH 1.905   CHOLESTEROL No  results for input(s): CHOL in the last 8760 hours.  Scheduled Meds: . amLODipine  5 mg Oral Daily  . aspirin EC  81 mg Oral Daily  . furosemide  40 mg Oral Daily  . heparin  5,000 Units Subcutaneous Q8H  . mometasone-formoterol  2 puff Inhalation BID  . pantoprazole  40 mg Oral Daily  . predniSONE  20 mg Oral QAC breakfast  . 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 Increase activity.   LOS: 3 days    Orpah CobbAjay Shaunte Weissinger  MD  09/10/2016, 11:21 AM

## 2016-09-11 MED ORDER — FUROSEMIDE 20 MG PO TABS: 40.0000 mg | ORAL_TABLET | Freq: Every day | ORAL | 3 refills | Status: DC

## 2016-09-11 MED ORDER — METOLAZONE 5 MG PO TABS
5.0000 mg | ORAL_TABLET | ORAL | Status: DC
Start: 2016-09-11 — End: 2017-05-01

## 2016-09-11 NOTE — Discharge Summary (Signed)
Physician Discharge Summary  Patient ID: Greg Jones MRN: [id number] DOB/AGE: November 22, 1927 83 y.o.  Admit date: 09/07/2016 Discharge date: 09/11/2016  Admission Diagnoses: Acute on chronic left heart systolic failure COPD, exacerbation Hypertension CKD GERD Obesity  Discharge Diagnoses:  Principle problem: * Acute on chronic left heart diastolic failure * Active Problems:   Acute exacerbation of COPD   Hypertension   CKD, III   GERD   Obesity  Discharged Condition: fair  Hospital Course: 83 year old male presented with shortness of breath, leg edema and 10 pounds of weight gain. He responded some to IV lasix but had improvement with IV solumedrol for his COPD exacerbation. He had mild systolic and moderate diastolic dysfunction with mild Aortic valve stenosis. He understood to decrease fluid intake and reduce weight by diet and 1-2 minutes of exercise 10-20 times a day. He will see me in 1 week and primary care in 1 month or as arranged.  Consults: cardiology  Significant Diagnostic Studies: labs: Normal electrolytes, BUN 26 to 46 and Creatinine 2.27 to 2.61. Troponin-I was minimally elevated as demand ischemia. BNP was normal. CBC was near normal.  Treatments: cardiac meds: amlodipine, furosemide and metolazone.  Discharge Exam: Blood pressure (!) 147/80, pulse 70, temperature 97.7 F (36.5 C), temperature source Oral, resp. rate 16, height 5' 7.5" (1.715 m), weight 85.1 kg (187 lb 11.2 oz), SpO2 98 %. General appearance: alert, cooperative and appears stated age Head: Normocephalic, atraumatic. Eyes: Brown eyes, pink conjunctivae/corneas clear. PERRL, EOM's intact.  Neck: no adenopathy, no carotid bruit, no JVD, supple, symmetrical, trachea midline and thyroid not enlarged. Resp: clear to auscultation but decreased breath sounds, bilaterally. Cardio: regular rate and rhythm, S1, S2 normal, II/VI murmur, click, rub or gallop GI: soft, non-tender; bowel sounds normal; no  masses, no organomegaly. Extremities: No cyanosis and trace edema Skin: Warm and dry. No rashes or lesions Neurologic: Alert and oriented X 3, normal strength and tone. Normal coordination and slow gait.  Disposition: 01-Home or Self Care   Allergies as of 09/11/2016      Reactions   Other Other (See Comments)   Oxygen Sensitive   Penicillins Rash, Other (See Comments)   Any -illins as well as Mycins Has patient had a PCN reaction causing immediate rash, facial/tongue/throat swelling, SOB or lightheadedness with hypotension: Yes Has patient had a PCN reaction causing severe rash involving mucus membranes or skin necrosis: Yes Has patient had a PCN reaction that required hospitalization No Has patient had a PCN reaction occurring within the last 10 years: No If all of the above answers are "NO", then may proceed with Cephalosporin use.   Vitamin B12 Itching, Rash, Other (See Comments)   injections      Medication List    STOP taking these medications   potassium chloride 10 MEQ tablet Commonly known as:  K-DUR     TAKE these medications   albuterol (2.5 MG/3ML) 0.083% nebulizer solution Commonly known as:  PROVENTIL Take 3 mLs (2.5 mg total) by nebulization every 6 (six) hours as needed for wheezing or shortness of breath. Reported on 07/14/2015   amLODipine 5 MG tablet Commonly known as:  NORVASC Take 1 tablet (5 mg total) by mouth daily.   aspirin EC 81 MG tablet Take 81 mg by mouth daily.   budesonide-formoterol 160-4.5 MCG/ACT inhaler Commonly known as:  SYMBICORT Inhale 2 puffs into the lungs 2 (two) times daily.   furosemide 20 MG tablet Commonly known as:  LASIX Take 2 tablets (  40 mg total) by mouth daily. What changed:  how much to take   metolazone 5 MG tablet Commonly known as:  ZAROXOLYN Take 1 tablet (5 mg total) by mouth every Monday, Wednesday, and Friday. As needed for leg edema What changed:  when to take this  additional instructions    omeprazole 20 MG capsule Commonly known as:  PRILOSEC Take 20 mg by mouth daily.   predniSONE 10 MG tablet Commonly known as:  DELTASONE TAKE ONE TABLET BY MOUTH ONCE DAILY WITH BREAKFAST      Follow-up Information    Augustine RadarOBERSON, [name], MD. Schedule an appointment as soon as possible for a visit in 1 month(s).   Specialty:  Nurse Practitioner Contact information: [address] [place] [address] [phone]        Ricki RodriguezKADAKIA,Locke Barrell S, MD. Schedule an appointment as soon as possible for a visit in 1 week(s).   Specialty:  Cardiology Contact information: [address] [place] [address] [phone]           Signed: Ricki RodriguezKADAKIA,Nima Kemppainen S 09/11/2016, 10:15 AM

## 2016-09-11 NOTE — Care Management Important Message (Signed)
Important Message  Patient Details  Name: Greg Jones MRN: [id number] Date of Birth: 11/07/1927   Medicare Important Message Given:  Yes    Essica Kiker Stefan ChurchBratton 09/11/2016, 12:38 PM

## 2016-10-17 ENCOUNTER — Encounter: Payer: Self-pay | Admitting: Pulmonary Disease

## 2016-10-17 ENCOUNTER — Ambulatory Visit (INDEPENDENT_AMBULATORY_CARE_PROVIDER_SITE_OTHER): Payer: Medicare Other | Admitting: Pulmonary Disease

## 2016-10-17 DIAGNOSIS — J449 Chronic obstructive pulmonary disease, unspecified: Secondary | ICD-10-CM | POA: Diagnosis not present

## 2016-10-17 DIAGNOSIS — I5042 Chronic combined systolic (congestive) and diastolic (congestive) heart failure: Secondary | ICD-10-CM

## 2016-10-17 MED ORDER — ALBUTEROL SULFATE (2.5 MG/3ML) 0.083% IN NEBU: 2.5000 mg | INHALATION_SOLUTION | Freq: Four times a day (QID) | RESPIRATORY_TRACT | 5 refills | Status: DC | PRN

## 2016-10-17 NOTE — Assessment & Plan Note (Signed)
Refills for albuterol nebs will be sent Stay on prednisone 10 mg daily  Use Symbicort twice daily and Spiriva once daily Use albuterol every 6 hours as needed in between for rescue 

## 2016-10-17 NOTE — Progress Notes (Signed)
   Subjective:    Patient ID: Greg Jones, male    DOB: 1927-09-04, 83 y.o.   MRN: [id number]  HPI  83 year old for FU of COPD . He smoked 1/2 pack per day for 70 years-about 35 pack years before he quit in 2014. He also has hypertension &CK D stage 4. He sees [place] for systolic CHF -EF 40% AAA at 3.6cm on CT ABD   Follows at the [facility] in [facility] Complaint  Patient presents with  . Follow-up    COPD follow up. Still having the SOB episodes.    81-month follow-up Remains on prednisone He is not really thriving, very short of breath even on walking a few steps  Last hospitalized 03/2016 -  He is maintained on a regimen of Symbicort and Spiriva and uses albuterol on an as-needed basis -some confusion about his medications, he was placed on Dulera in the hospital and is still taking this-confused between [facility] and albuterol He has a very sedentary lifestyle  Continues to have edema left leg, compliant with Lasix, Spiriva seems to follow in office medication list although he still taking this   Significant tests/ events -reviewed  PFTs 10/2014 showed FEV1 of 0.84-39%, FVC of 2.1-67%, without significant broncho-dilator response. DLCO was relatively preserved at 73%.  04/2015 Spirometry showed FEV1 of 34%-0.81   Review of Systems neg for any significant sore throat, dysphagia, itching, sneezing, nasal congestion or excess/ purulent secretions, fever, chills, sweats, unintended wt loss, pleuritic or exertional cp, hempoptysis, orthopnea pnd or change in chronic leg swelling.   Also denies presyncope, palpitations, heartburn, abdominal pain, nausea, vomiting, diarrhea or change in bowel or urinary habits, dysuria,hematuria, rash, arthralgias, visual complaints, headache, numbness weakness or ataxia.     Objective:   Physical Exam  Gen. Pleasant, well-nourished, in no distress ENT - no thrush, no post nasal drip Neck: No JVD, no thyromegaly, no carotid  bruits Lungs: no use of accessory muscles, no dullness to percussion, clear without rales or rhonchi  Cardiovascular: Rhythm regular, heart sounds  normal, no murmurs or gallops, 1+ peripheral edema, L >> R Musculoskeletal: No deformities, no cyanosis or clubbing        Assessment & Plan:

## 2016-10-17 NOTE — Assessment & Plan Note (Signed)
Clarified Lasix dosing 40 daily Zaroxolyn as needed only

## 2016-10-17 NOTE — Patient Instructions (Signed)
Refills for albuterol nebs will be sent Stay on prednisone 10 mg daily  Use Symbicort twice daily and Spiriva once daily Use albuterol every 6 hours as needed in between for rescue

## 2016-10-17 NOTE — Addendum Note (Signed)
Addended by: Maurene Capes on: 10/17/2016 02:00 PM   Modules accepted: Orders

## 2016-12-18 ENCOUNTER — Telehealth: Payer: Self-pay | Admitting: Pulmonary Disease

## 2016-12-18 MED ORDER — ALBUTEROL SULFATE (2.5 MG/3ML) 0.083% IN NEBU: 2.5000 mg | INHALATION_SOLUTION | Freq: Four times a day (QID) | RESPIRATORY_TRACT | 5 refills | Status: DC | PRN

## 2016-12-18 NOTE — Telephone Encounter (Signed)
Spoke with pt's sister. Pt is needing a refill on albuterol nebulizer solution. Rx has been sent in. Nothing further was needed.

## 2017-01-16 ENCOUNTER — Telehealth: Payer: Self-pay | Admitting: Pulmonary Disease

## 2017-01-16 NOTE — Telephone Encounter (Signed)
Spoke with pt's sister, [name]. Pt is needing a refill on Albuterol Neb Solution. This was sent in on 12/18/2016 with 5 refills. Pt's sister is aware of this. She will contact pt's pharmacy to have this refilled. Nothing further was needed.

## 2017-01-18 ENCOUNTER — Ambulatory Visit (INDEPENDENT_AMBULATORY_CARE_PROVIDER_SITE_OTHER): Payer: Medicare Other | Admitting: Adult Health

## 2017-01-18 ENCOUNTER — Encounter: Payer: Self-pay | Admitting: Adult Health

## 2017-01-18 DIAGNOSIS — I5042 Chronic combined systolic (congestive) and diastolic (congestive) heart failure: Secondary | ICD-10-CM | POA: Diagnosis not present

## 2017-01-18 DIAGNOSIS — J449 Chronic obstructive pulmonary disease, unspecified: Secondary | ICD-10-CM

## 2017-01-18 MED ORDER — ALBUTEROL SULFATE (2.5 MG/3ML) 0.083% IN NEBU: 2.5000 mg | INHALATION_SOLUTION | Freq: Four times a day (QID) | RESPIRATORY_TRACT | 1 refills | Status: DC | PRN

## 2017-01-18 MED ORDER — ALBUTEROL SULFATE (2.5 MG/3ML) 0.083% IN NEBU: 2.5000 mg | INHALATION_SOLUTION | Freq: Four times a day (QID) | RESPIRATORY_TRACT | 5 refills | Status: DC | PRN

## 2017-01-18 NOTE — Assessment & Plan Note (Signed)
Compensated on present regimen   Plan  Patient Instructions  Continue on Symbicort and Spiriva daily .  Remain on Prednisone 10mg  daily .  Mucinex DM Twice daily  As needed  Cough/congestion  Follow up with Dr. Vassie LollAlva  In 4 months and As needed

## 2017-01-18 NOTE — Patient Instructions (Signed)
Continue on Symbicort and Spiriva daily .  Remain on Prednisone 10mg  daily .  Mucinex DM Twice daily  As needed  Cough/congestion  Follow up with Dr. Vassie LollAlva  In 4 months and As needed

## 2017-01-18 NOTE — Addendum Note (Signed)
Addended by: Boone MasterJONES, Topher Buen[place]ent[place] E on: 01/18/2017 05:07 PM   Modules accepted: Orders

## 2017-01-18 NOTE — Progress Notes (Signed)
 @Patient  ID: Greg RobertsJonathan Jones, male    DOB: 02/01/1928, 83 y.o.   MRN: [id number]  Chief Complaint  Patient presents with  . Follow-up    COPD     Referring provider: [name], [name]*  HPI: 83 year old male former smoker followed her COPD Patient has a history of hypertension, chronic kidney disease stage IV, congestive heart failure, AAA  Significant tests/ events -reviewed  PFTs 10/2014 showed FEV1 of 0.84-39%, FVC of 2.1-67%, without significant broncho-dilator response. DLCO was relatively preserved at 73%.  04/2015 Spirometry showed FEV1 of 34%-0.81   01/18/2017 Follow up: COPD  Patient returns for three-month follow-up for COPD. Patient says overall he is doing okay with no flare cough or wheezing. He remains on Symbicort and Spiriva daily. He is on daily steroids with prednisone 10 mg daily. Steroid patient education given. Does get winded with prolonged walking . Chest x-ray in March 2018 showed no acute process.[name].  PV[facility] and [facility] utd, [facility] .  Patient does have congestive heart failure combined systolic and diastolic,. He is on Lasix . has not had to take Zaroxolyn in last several months. . Does get ankle edema. He is getting TED hose. Denies significant weight gain.   Allergies  Allergen Reactions  . Other Other (See Comments)    Oxygen Sensitive  . Penicillins Rash and Other (See Comments)    Any -illins as well as Mycins Has patient had a PCN reaction causing immediate rash, facial/tongue/throat swelling, SOB or lightheadedness with hypotension: Yes Has patient had a PCN reaction causing severe rash involving mucus membranes or skin necrosis: Yes Has patient had a PCN reaction that required hospitalization No Has patient had a PCN reaction occurring within the last 10 years: No If all of the above answers are "NO", then may proceed with Cephalosporin use.   . Vitamin B12 Itching, Rash and Other (See Comments)    injections    Immunization History    Administered Date(s) Administered  . Influenza,inj,Quad PF,36+ Mos 02/15/2016  . Influenza-Unspecified 05/03/2015  . Pneumococcal Conjugate-13 07/02/2015    Past Medical History:  Diagnosis Date  . CHF (congestive heart failure) (HCC)   . Chronic kidney disease   . COPD (chronic obstructive pulmonary disease) (HCC)   . GERD (gastroesophageal reflux disease)   . Hypertension   . Respiratory insufficiency     Tobacco History: History  Smoking Status  . Former Smoker  . Packs/day: 0.50  . Years: 70.00  . Types: Cigarettes  . Quit date: 04/19/2013  Smokeless Tobacco  . Never Used   Counseling given: Not Answered   Outpatient Encounter Prescriptions as of 01/18/2017  Medication Sig  . albuterol (PROVENTIL) (2.5 MG/3ML) 0.083% nebulizer solution Take 3 mLs (2.5 mg total) by nebulization every 6 (six) hours as needed for wheezing or shortness of breath. Reported on 07/14/2015  . amLODipine (NORVASC) 5 MG tablet Take 1 tablet (5 mg total) by mouth daily.  [name]. aspirin EC 81 MG tablet Take 81 mg by mouth daily.  . budesonide-formoterol (SYMBICORT) 160-4.5 MCG/ACT inhaler Inhale 2 puffs into the lungs 2 (two) times daily.  . furosemide (LASIX) 20 MG tablet Take 2 tablets (40 mg total) by mouth daily.  . isosorbide mononitrate (IMDUR) 30 MG 24 hr tablet Take 30 mg by mouth daily.  . metolazone (ZAROXOLYN) 5 MG tablet Take 1 tablet (5 mg total) by mouth every Monday, Wednesday, and Friday. As needed for leg edema (Patient taking differently: Take 5 mg by mouth as needed.  As needed for leg edema)  . omeprazole (PRILOSEC) 20 MG capsule Take 20 mg by mouth daily.  . predniSONE (DELTASONE) 10 MG tablet TAKE ONE TABLET BY MOUTH ONCE DAILY WITH BREAKFAST  . tiotropium (SPIRIVA) 18 MCG inhalation capsule Place 18 mcg into inhaler and inhale daily.   No facility-administered encounter medications on file as of 01/18/2017.      Review of Systems  Constitutional:   No  weight loss, night sweats,   Fevers, chills, + fatigue, or  lassitude.  HEENT:   No headaches,  Difficulty swallowing,  Tooth/dental problems, or  Sore throat,                No sneezing, itching, ear ache, nasal congestion, post nasal drip,   CV:  No chest pain,  Orthopnea, PND, swelling in lower extremities, anasarca, dizziness, palpitations, syncope.   GI  No heartburn, indigestion, abdominal pain, nausea, vomiting, diarrhea, change in bowel habits, loss of appetite, bloody stools.   Resp:    No wheezing.  No chest wall deformity  Skin: no rash or lesions.  GU: no dysuria, change in color of urine, no urgency or frequency.  No flank pain, no hematuria   MS:  No joint pain or swelling.  No decreased range of motion.  No back pain.    Physical Exam  BP 132/66 (BP Location: Left Arm, Cuff Size: Normal)   Pulse 95   Ht 5' 7.5" (1.715 m)   Wt 196 lb 6.4 oz (89.1 kg)   SpO2 98%   BMI 30.31 kg/m   GEN: A/Ox3; pleasant , NAD, elderly , walker    HEENT:  [place]/AT,  EACs-clear, TMs-wnl, NOSE-clear, THROAT-clear, no lesions, no postnasal drip or exudate noted.   NECK:  Supple w/ fair ROM; no JVD; normal carotid impulses w/o bruits; no thyromegaly or nodules palpated; no lymphadenopathy.    RESP  Decreased BS in bases   no accessory muscle use, no dullness to percussion  CARD:  RRR, no m/r/g, tr  peripheral edema, pulses intact, no cyanosis or clubbing.  GI:   Soft & nt; nml bowel sounds; no organomegaly or masses detected.   Musco: Warm bil, no deformities or joint swelling noted.   Neuro: alert, no focal deficits noted.    Skin: Warm, no lesions or rashes    Lab Results:  CBC  BMET  BNP  ProBNP No results found for: PROBNP  Imaging: No results found.   Assessment & Plan:   COPD (chronic obstructive pulmonary disease) (HCC) Compensated on present regimen   Plan  Patient Instructions  Continue on Symbicort and Spiriva daily .  Remain on Prednisone 10mg  daily .  Mucinex DM Twice daily   As needed  Cough/congestion  Follow up with Dr. Vassie LollAlva  In 4 months and As needed        Chronic combined systolic and diastolic CHF (congestive heart failure) (HCC) Appears stable without overt overload.   Plan  Patient Instructions  Continue on Symbicort and Spiriva daily .  Remain on Prednisone 10mg  daily .  Mucinex DM Twice daily  As needed  Cough/congestion  Follow up with Dr. Vassie LollAlva  In 4 months and As needed           Rubye Oaksammy Parrett, NP 01/18/2017

## 2017-01-18 NOTE — Assessment & Plan Note (Signed)
Appears stable without overt overload.   Plan  Patient Instructions  Continue on Symbicort and Spiriva daily .  Remain on Prednisone 10mg  daily .  Mucinex DM Twice daily  As needed  Cough/congestion  Follow up with Dr. Vassie LollAlva  In 4 months and As needed

## 2017-03-21 ENCOUNTER — Other Ambulatory Visit: Payer: Self-pay | Admitting: Pulmonary Disease

## 2017-03-22 ENCOUNTER — Telehealth: Payer: Self-pay | Admitting: Pulmonary Disease

## 2017-03-22 MED ORDER — PREDNISONE 10 MG PO TABS: ORAL_TABLET | ORAL | 5 refills | Status: DC

## 2017-03-22 NOTE — Telephone Encounter (Signed)
Called and spoke with pts sister and she is aware of the refill that has been sent to the pharmacy.

## 2017-03-23 ENCOUNTER — Telehealth: Payer: Self-pay | Admitting: Pulmonary Disease

## 2017-03-23 DIAGNOSIS — J449 Chronic obstructive pulmonary disease, unspecified: Secondary | ICD-10-CM

## 2017-03-23 MED ORDER — ALBUTEROL SULFATE (2.5 MG/3ML) 0.083% IN NEBU: 2.5000 mg | INHALATION_SOLUTION | Freq: Four times a day (QID) | RESPIRATORY_TRACT | 1 refills | Status: AC

## 2017-03-23 NOTE — Telephone Encounter (Signed)
Called and spoke with pts sister and she stated that the [place] will not refill the albuterol for the nebulizer until November.  This has been sent to the pharmacy as the pt will run out this weekend.  Nothing further is needed.

## 2017-05-01 ENCOUNTER — Ambulatory Visit (INDEPENDENT_AMBULATORY_CARE_PROVIDER_SITE_OTHER): Payer: Medicare Other | Admitting: Pulmonary Disease

## 2017-05-01 ENCOUNTER — Encounter: Payer: Self-pay | Admitting: Pulmonary Disease

## 2017-05-01 DIAGNOSIS — J441 Chronic obstructive pulmonary disease with (acute) exacerbation: Secondary | ICD-10-CM

## 2017-05-01 DIAGNOSIS — I5042 Chronic combined systolic (congestive) and diastolic (congestive) heart failure: Secondary | ICD-10-CM

## 2017-05-01 NOTE — Patient Instructions (Signed)
Increase prednisone to 20 mg daily for 5 days. Okay to use albuterol nebs every 6 hours as needed  Call us if sputum changes color.  Call us if no better by Monday Stay on Symbicort and Spiriva

## 2017-05-01 NOTE — Assessment & Plan Note (Signed)
We will treat as flare, although does not seem to be infective exacerbation  Increase prednisone to 20 mg daily for 5 days. Okay to use albuterol nebs every 6 hours as needed  Call us if sputum changes color.  Call us if no better by Monday Stay on Symbicort and Spiriva

## 2017-05-01 NOTE — Progress Notes (Signed)
   Subjective:    Patient ID: Greg RobertsJonathan Jones, male    DOB: 02/19/1928, 83 y.o.   MRN: [id number]  HPI  83 year old for FU of COPD . He smoked 1/2 pack per day for 70 years-about 35 pack years before he quit in 2014. He also has hypertension &CK D stage 4. [place] -EF 41%50% AAA at 3.6cm on CT ABD   [facility] [place] -he saw his nephrologist and was noted to be wheezing excessively.  And sister called and made the appointment a week early. Denies pedal edema, sputum is clear in color, excessive wheezing noted by sister. He ambulates with a walker for only short distances and is very sedentary. Completed pulmonary rehab in 2017.  He is compliant with Symbicort and Spiriva.  Denies reflux or sinus drip  He remains on 10 mg of prednisone daily   Significant tests/ events -reviewed  PFTs 10/2014 showed FEV1 of 0.84-39%, FVC of 2.1-67%, without significant broncho-dilator response. DLCO was relatively preserved at 73%.  04/2015 Spirometry showed FEV1 of 34%-0.81   Review of Systems neg for any significant sore throat, dysphagia, itching, sneezing, nasal congestion or excess/ purulent secretions, fever, chills, sweats, unintended wt loss, pleuritic or exertional cp, hempoptysis, orthopnea pnd or change in chronic leg swelling. Also denies presyncope, palpitations, heartburn, abdominal pain, nausea, vomiting, diarrhea or change in bowel or urinary habits, dysuria,hematuria, rash, arthralgias, visual complaints, headache, numbness weakness or ataxia.     Objective:   Physical Exam  Gen. Pleasant, elderly,well-nourished, in no distress ENT - no thrush, no post nasal drip Neck: No JVD, no thyromegaly, no carotid bruits Lungs: no use of accessory muscles, no dullness to percussion, decreased without rales or rhonchi  Cardiovascular: Rhythm regular, heart sounds  normal, no murmurs or gallops, no peripheral edema Musculoskeletal: No deformities,  no cyanosis or clubbing        Assessment & Plan:

## 2017-05-01 NOTE — Assessment & Plan Note (Signed)
No evidence of fluid overload.  

## 2017-05-07 ENCOUNTER — Ambulatory Visit: Payer: Medicare Other | Admitting: Pulmonary Disease

## 2017-05-07 ENCOUNTER — Telehealth: Payer: Self-pay | Admitting: Pulmonary Disease

## 2017-05-07 MED ORDER — PREDNISONE 10 MG PO TABS: ORAL_TABLET | ORAL | 2 refills | Status: DC

## 2017-05-07 NOTE — Telephone Encounter (Signed)
Stay on 20 mg of prednisone for 5 more days. Then 15 mg of prednisone for 2 weeks.  Then get down back to baseline dose of 10 mg of prednisone  Please provide extra dose of prednisone as needed

## 2017-05-07 NOTE — Telephone Encounter (Signed)
Spoke with pt's sister Advertising copywriterClementine (dpr on file) states that pt is doing much better on the "double dose" (20mg  daily) of prednisone.  [name] is wanting to know if they should keep pt on 20mg  daily for longer than the 5 days as originally prescribed.  Requesting RA's recs.    Nico[facility]Wal [facility] [address].

## 2017-05-07 NOTE — Telephone Encounter (Signed)
Spoke with pt's sister, aware of recs.  Supplemental prednisone sent to preferred pharmacy.  Nothing further needed.

## 2017-05-30 ENCOUNTER — Emergency Department (HOSPITAL_COMMUNITY)
Admission: EM | Admit: 2017-05-30 | Discharge: 2017-05-31 | Disposition: A | Discharge status: Home / Self Care | Payer: Medicare Other | Attending: Emergency Medicine | Admitting: Emergency Medicine

## 2017-05-30 ENCOUNTER — Emergency Department (HOSPITAL_COMMUNITY): Payer: Medicare Other

## 2017-05-30 ENCOUNTER — Other Ambulatory Visit: Payer: Self-pay

## 2017-05-30 ENCOUNTER — Encounter (HOSPITAL_COMMUNITY): Payer: Self-pay

## 2017-05-30 DIAGNOSIS — Z87891 Personal history of nicotine dependence: Secondary | ICD-10-CM | POA: Insufficient documentation

## 2017-05-30 DIAGNOSIS — Z7982 Long term (current) use of aspirin: Secondary | ICD-10-CM | POA: Insufficient documentation

## 2017-05-30 DIAGNOSIS — Z79899 Other long term (current) drug therapy: Secondary | ICD-10-CM | POA: Insufficient documentation

## 2017-05-30 DIAGNOSIS — R531 Weakness: Secondary | ICD-10-CM | POA: Diagnosis not present

## 2017-05-30 DIAGNOSIS — J449 Chronic obstructive pulmonary disease, unspecified: Secondary | ICD-10-CM | POA: Insufficient documentation

## 2017-05-30 DIAGNOSIS — I5042 Chronic combined systolic (congestive) and diastolic (congestive) heart failure: Secondary | ICD-10-CM | POA: Diagnosis not present

## 2017-05-30 DIAGNOSIS — I130 Hypertensive heart and chronic kidney disease with heart failure and stage 1 through stage 4 chronic kidney disease, or unspecified chronic kidney disease: Secondary | ICD-10-CM | POA: Insufficient documentation

## 2017-05-30 DIAGNOSIS — E860 Dehydration: Secondary | ICD-10-CM | POA: Insufficient documentation

## 2017-05-30 DIAGNOSIS — N184 Chronic kidney disease, stage 4 (severe): Secondary | ICD-10-CM | POA: Diagnosis not present

## 2017-05-30 LAB — URINALYSIS, ROUTINE W REFLEX MICROSCOPIC
BACTERIA UA: NONE SEEN
BILIRUBIN URINE: NEGATIVE
Glucose, UA: >=500 mg/dL — AB
HGB URINE DIPSTICK: NEGATIVE
Ketones, ur: 5 mg/dL — AB
LEUKOCYTES UA: NEGATIVE
Nitrite: NEGATIVE
PROTEIN: NEGATIVE mg/dL
SPECIFIC GRAVITY, URINE: 1.012 (ref 1.005–1.030)
pH: 6 (ref 5.0–8.0)

## 2017-05-30 LAB — COMPREHENSIVE METABOLIC PANEL
ALK PHOS: 95 U/L (ref 38–126)
ALT: 48 U/L (ref 17–63)
AST: 28 U/L (ref 15–41)
Albumin: 3.6 g/dL (ref 3.5–5.0)
Anion gap: 17 — ABNORMAL HIGH (ref 5–15)
BUN: 38 mg/dL — AB (ref 6–20)
CALCIUM: 9.4 mg/dL (ref 8.9–10.3)
CO2: 26 mmol/L (ref 22–32)
CREATININE: 2.43 mg/dL — AB (ref 0.61–1.24)
Chloride: 91 mmol/L — ABNORMAL LOW (ref 101–111)
GFR calc Af Amer: 26 mL/min — ABNORMAL LOW (ref >60)
GFR calc non Af Amer: 22 mL/min — ABNORMAL LOW (ref >60)
GLUCOSE: 530 mg/dL — AB (ref 65–99)
Potassium: 3.8 mmol/L (ref 3.5–5.1)
Sodium: 134 mmol/L — ABNORMAL LOW (ref 135–145)
Total Bilirubin: 1.1 mg/dL (ref 0.3–1.2)
Total Protein: 7.2 g/dL (ref 6.5–8.1)

## 2017-05-30 LAB — CBG MONITORING, ED
GLUCOSE-CAPILLARY: 276 mg/dL — AB (ref 65–99)
Glucose-Capillary: 211 mg/dL — ABNORMAL HIGH (ref 65–99)

## 2017-05-30 LAB — CBC WITH DIFFERENTIAL/PLATELET
BASOS ABS: 0 10*3/uL (ref 0.0–0.1)
Basophils Relative: 0 %
EOS PCT: 1 %
Eosinophils Absolute: 0.1 10*3/uL (ref 0.0–0.7)
HCT: 42.5 % (ref 39.0–52.0)
HEMOGLOBIN: 14.7 g/dL (ref 13.0–17.0)
LYMPHS ABS: 1 10*3/uL (ref 0.7–4.0)
Lymphocytes Relative: 7 %
MCH: 28.3 pg (ref 26.0–34.0)
MCHC: 34.6 g/dL (ref 30.0–36.0)
MCV: 81.7 fL (ref 78.0–100.0)
Monocytes Absolute: 0.6 10*3/uL (ref 0.1–1.0)
Monocytes Relative: 4 %
NEUTROS PCT: 88 %
Neutro Abs: 12.6 10*3/uL — ABNORMAL HIGH (ref 1.7–7.7)
PLATELETS: 226 10*3/uL (ref 150–400)
RBC: 5.2 MIL/uL (ref 4.22–5.81)
RDW: 15.2 % (ref 11.5–15.5)
WBC: 14.2 10*3/uL — AB (ref 4.0–10.5)

## 2017-05-30 LAB — TROPONIN I: Troponin I: 0.04 ng/mL (ref <0.03)

## 2017-05-30 LAB — I-STAT TROPONIN, ED: TROPONIN I, POC: 0.05 ng/mL (ref 0.00–0.08)

## 2017-05-30 LAB — BRAIN NATRIURETIC PEPTIDE: B Natriuretic Peptide: 66.8 pg/mL (ref 0.0–100.0)

## 2017-05-30 MED ORDER — SODIUM CHLORIDE 0.9 % IV BOLUS (SEPSIS)
500.0000 mL | Freq: Once | INTRAVENOUS | Status: AC
  Administered 2017-05-30: 500 mL via INTRAVENOUS

## 2017-05-30 MED ORDER — METFORMIN HCL 500 MG PO TABS: 500.0000 mg | ORAL_TABLET | Freq: Two times a day (BID) | ORAL | 0 refills | Status: DC

## 2017-05-30 MED ORDER — INSULIN ASPART 100 UNIT/ML IV SOLN
10.0000 [IU] | Freq: Once | INTRAVENOUS | Status: AC
  Administered 2017-05-30: 10 [IU] via INTRAVENOUS
  Filled 2017-05-30: qty 0.1

## 2017-05-30 NOTE — ED Notes (Signed)
Patient able to walk to the bathroom with assistance.

## 2017-05-30 NOTE — ED Notes (Signed)
Bed: [facility] Expected date:  Expected time:  Means of arrival:  Comments: EMS-weak 

## 2017-05-30 NOTE — ED Provider Notes (Signed)
[facility]-EMERGENCY DEPT Provider Note   CSN: [id number] Arrival date & time: 05/30/17  1344     History   Chief Complaint Chief Complaint  Patient presents with  . Weakness    HPI Greg Jones is a 83 y.o. male.  HPI Patient with a history of CHF and COPD presents with 3 days of generalized weakness.  States he recently had his prednisone increased for chronic shortness of breath.  He has noticed he has had increased urinary frequency and increased thirst.  No chest pain.  No fevers or chills.  Patient denies any other urinary symptoms other than frequency.  Has episodic left foot swelling but denies any currently. Past Medical History:  Diagnosis Date  . CHF (congestive heart failure) (HCC)   . Chronic kidney disease   . COPD (chronic obstructive pulmonary disease) (HCC)   . GERD (gastroesophageal reflux disease)   . Hypertension   . Respiratory insufficiency     Patient Active Problem List   Diagnosis Date Noted  . Pressure injury of skin 04/12/2016  . Near syncope 04/11/2016  . Pre-syncope 04/11/2016  . CKD (chronic kidney disease), stage IV (HCC) 04/11/2016  . QT prolongation 04/11/2016  . Hypokalemia 04/11/2016  . Blood glucose elevated 04/11/2016  . Chronic combined systolic and diastolic CHF (congestive heart failure) (HCC) 01/11/2016  . Dyspnea   . COPD exacerbation (HCC) 09/07/2015  . GERD (gastroesophageal reflux disease) 05/28/2015  . COPD (chronic obstructive pulmonary disease) (HCC) 05/10/2015  . Essential hypertension 05/10/2015    History reviewed. No pertinent surgical history.     Home Medications    Prior to Admission medications   Medication Sig Start Date End Date Taking? Authorizing Provider  albuterol (PROVENTIL) (2.5 MG/3ML) 0.083% nebulizer solution Take 3 mLs (2.5 mg total) by nebulization every 6 (six) hours. DX:  J44.9 03/23/17  Yes Oretha Milch, MD  amLODipine (NORVASC) 5 MG tablet Take 1 tablet (5 mg  total) by mouth daily. 09/14/15  Yes Simonne Martinet, NP  aspirin EC 81 MG tablet Take 81 mg by mouth daily.   Yes [provider]  budesonide-formoterol (SYMBICORT) 160-4.5 MCG/ACT inhaler Inhale 2 puffs into the lungs 2 (two) times daily.   Yes [provider]  furosemide (LASIX) 20 MG tablet Take 2 tablets (40 mg total) by mouth daily. 09/11/16  Yes Orpah Cobb, MD  isosorbide mononitrate (IMDUR) 30 MG 24 hr tablet Take 30 mg by mouth daily.   Yes [provider]  omeprazole (PRILOSEC) 20 MG capsule Take 20 mg by mouth daily.   Yes [provider]  predniSONE (DELTASONE) 10 MG tablet 20mg  X5 days, 15mg  X14 days, then 10mg  daily Patient taking differently: Take 10-20 mg by mouth as directed. 20mg  X5 days, 15mg  X14 days, then 10mg  daily 05/07/17  Yes Oretha Milch, MD  tiotropium (SPIRIVA) 18 MCG inhalation capsule Place 18 mcg into inhaler and inhale daily.   Yes [provider]  metFORMIN (GLUCOPHAGE) 500 MG tablet Take 1 tablet (500 mg total) by mouth 2 (two) times daily. 1 p.o. daily while taking higher doses of prednisone. 05/30/17   Rolland Porter, MD    Family History Family History  Problem Relation Age of Onset  . Tuberculosis Mother   . Diabetes Father     Social History Social History   Tobacco Use  . Smoking status: Former Smoker    Packs/day: 0.50    Years: 70.00    Pack years: 35.00  Types: Cigarettes    Last attempt to quit: 04/19/2013    Years since quitting: 4.1  . Smokeless tobacco: Never Used  Substance Use Topics  . Alcohol use: No    Alcohol/week: 0.0 oz  . Drug use: No     Allergies   Other; Penicillins; and Vitamin b12   Review of Systems Review of Systems  Constitutional: Positive for fatigue. Negative for chills and fever.  HENT: Positive for sore throat. Negative for congestion.   Respiratory: Positive for shortness of breath. Negative for cough.   Cardiovascular: Positive for leg swelling. Negative  for chest pain and palpitations.  Gastrointestinal: Negative for abdominal pain, constipation, diarrhea, nausea and vomiting.  Genitourinary: Positive for frequency. Negative for difficulty urinating, dysuria, flank pain and hematuria.  Musculoskeletal: Negative for back pain, myalgias and neck pain.  Skin: Negative for rash and wound.  Neurological: Positive for weakness. Negative for dizziness, syncope, light-headedness, numbness and headaches.  All other systems reviewed and are negative.    Physical Exam Updated Vital Signs BP (!) 153/85 (BP Location: Left Arm)   Pulse 87   Temp 97.7 F (36.5 C) (Oral)   Resp 17   SpO2 96%   Physical Exam  Constitutional: He is oriented to person, place, and time. He appears well-developed and well-nourished. No distress.  HENT:  Head: Normocephalic and atraumatic.  Mouth/Throat: Oropharynx is clear and moist.  Eyes: EOM are normal. Pupils are equal, round, and reactive to light.  Neck: Normal range of motion. Neck supple. No JVD present.  Cardiovascular: Normal rate and regular rhythm. Exam reveals no gallop and no friction rub.  No murmur heard. Pulmonary/Chest: Effort normal and breath sounds normal. No stridor. No respiratory distress. He has no wheezes. He has no rales. He exhibits no tenderness.  Abdominal: Soft. Bowel sounds are normal. There is no tenderness. There is no rebound and no guarding.  Musculoskeletal: Normal range of motion. He exhibits no edema or tenderness.  No lower extremity swelling, asymmetry or tenderness.  Distal pulses are 2+.  Neurological: He is alert and oriented to person, place, and time.  Moves all extremities.  Sensation fully intact.  Skin: Skin is warm and dry. Capillary refill takes less than 2 seconds. No rash noted. He is not diaphoretic. No erythema.  Psychiatric: He has a normal mood and affect. His behavior is normal.  Nursing note and vitals reviewed.    ED Treatments / Results  Labs (all  labs ordered are listed, but only abnormal results are displayed) Labs Reviewed  CBC WITH DIFFERENTIAL/PLATELET - Abnormal; Notable for the following components:      Result Value   WBC 14.2 (*)    Neutro Abs 12.6 (*)    All other components within normal limits  COMPREHENSIVE METABOLIC PANEL - Abnormal; Notable for the following components:   Sodium 134 (*)    Chloride 91 (*)    Glucose, Bld 530 (*)    BUN 38 (*)    Creatinine, Ser 2.43 (*)    GFR calc non Af Amer 22 (*)    GFR calc Af Amer 26 (*)    Anion gap 17 (*)    All other components within normal limits  TROPONIN I - Abnormal; Notable for the following components:   Troponin I 0.04 (*)    All other components within normal limits  URINALYSIS, ROUTINE W REFLEX MICROSCOPIC - Abnormal; Notable for the following components:   Color, Urine STRAW (*)    Glucose, UA >=  500 (*)    Ketones, ur 5 (*)    Squamous Epithelial / LPF 0-5 (*)    All other components within normal limits  CBG MONITORING, ED - Abnormal; Notable for the following components:   Glucose-Capillary 211 (*)    All other components within normal limits  CBG MONITORING, ED - Abnormal; Notable for the following components:   Glucose-Capillary 276 (*)    All other components within normal limits  BRAIN NATRIURETIC PEPTIDE  I-STAT TROPONIN, ED    EKG  EKG Interpretation  Date/Time:  Wednesday May 30 2017 16:41:55 EST Ventricular Rate:  92 PR Interval:    QRS Duration: 97 QT Interval:  461 QTC Calculation: 571 R Axis:   -15 Text Interpretation:  Sinus rhythm Atrial premature complexes Borderline left axis deviation Prolonged QT interval Confirmed by Rolland PorterJames, Mark ([id number]) on 05/30/2017 5:00:48 PM       Radiology No results found.  Procedures Procedures (including critical care time)  Medications Ordered in ED Medications  sodium chloride 0.9 % bolus 500 mL (0 mLs Intravenous Stopped 05/30/17 1825)  insulin aspart (novoLOG) injection 10 Units  (10 Units Intravenous Given 05/30/17 1618)  sodium chloride 0.9 % bolus 500 mL (0 mLs Intravenous Stopped 05/30/17 1751)     Initial Impression / Assessment and Plan / ED Course  I have reviewed the triage vital signs and the nursing notes.  Pertinent labs & imaging results that were available during my care of the patient were reviewed by me and considered in my medical decision making (see chart for details).    Signed out to oncoming emergency physician pending reevaluation after IV fluids and insulin.   Final Clinical Impressions(s) / ED Diagnoses   Final diagnoses:  Weakness  Dehydration    ED Discharge Orders        Ordered    metFORMIN (GLUCOPHAGE) 500 MG tablet  2 times daily     05/30/17 2007       Loren RacerYelverton, Anneth Brunell, MD 06/02/17 [phone]

## 2017-05-30 NOTE — ED Provider Notes (Signed)
Care assumed from Dr. Ranae PalmsYelverton.  Patient hyperglycemic.  Renal function at baseline.  Hydrated.  His sugars have improved.  He is continuing on prednisone taper for an additional 20 days.  He has hyperglycemia symptoms with polyuria, polydipsia, and 14 pound weight loss preceded the increased dose of his prednisone.  He is likely a type II diabetic that has been exacerbated by his steroids.  Will add once per day metformin 500 only which should hopefully help his hyperglycemia, while avoiding episodes of hypoglycemia.  He is eating well.  Appropriate for discharge home.  Ambulatory here.   Rolland PorterJames, Nirali Magouirk, MD 05/30/17 2009

## 2017-05-30 NOTE — ED Triage Notes (Signed)
Pt. arrived via [facility] from home. C/o general weakness.HX. Of chronic COPD.  Recent prednisone increase. CBG-586. No respiratory complaint, sob, or chest pain. A/O X4. Increased urinary output and thirst. Echo done the beginning of november, EF-50%. Lost 13lbs since then.

## 2017-05-30 NOTE — ED Notes (Signed)
Date and time results received: 05/30/17 1554  Test: troponin Critical Value: 0.04

## 2017-05-30 NOTE — Discharge Instructions (Signed)
Start metformin.  Take 1 tablet/day while you are on your higher doses of prednisone.  Push fluids to stay hydrated.  Call your primary care physician tomorrow to make follow-up appointment as soon as possible to review your ER visit

## 2017-05-30 NOTE — ED Notes (Signed)
Date and time results received: 05/30/17 1555  Test: Glucose Critical Value: 530

## 2017-05-31 ENCOUNTER — Telehealth: Payer: Self-pay | Admitting: Emergency Medicine

## 2017-05-31 NOTE — Telephone Encounter (Signed)
CM contacted by [facility] after pt's sister called with concerns over a medication.  Dr. Fayrene FearingJames prescribed metformin for 2 weeks but the pt [facility].  Spoke with Dr. Fayrene FearingJames who states the pt is on steroids which is causing the pt's blood sugar to be high temporarily.  The metformin will last until the end of the steroids.  Called pt's sister back and explained this information.  Advised her that if she has concerns before the pt's appointment, the pt can always been seen at a UC or another walk-in clinic, and emergently can be seen again in the ED.  Pt's sister acknowledged understanding.  No further CM needs noted at this time.

## 2017-05-31 NOTE — ED Notes (Signed)
Mistaken Entry for transport request.  Request made by previous RN; Ptar unable to transport d/t ice @ pt's residence; transport request to be made after 0800.

## 2017-05-31 NOTE — ED Notes (Signed)
[facility] called  

## 2017-08-06 ENCOUNTER — Ambulatory Visit (INDEPENDENT_AMBULATORY_CARE_PROVIDER_SITE_OTHER): Payer: Medicare Other | Admitting: Adult Health

## 2017-08-06 ENCOUNTER — Encounter: Payer: Self-pay | Admitting: Adult Health

## 2017-08-06 DIAGNOSIS — J449 Chronic obstructive pulmonary disease, unspecified: Secondary | ICD-10-CM

## 2017-08-06 DIAGNOSIS — I5042 Chronic combined systolic (congestive) and diastolic (congestive) heart failure: Secondary | ICD-10-CM | POA: Diagnosis not present

## 2017-08-06 NOTE — Assessment & Plan Note (Signed)
Compensated on present regimen   Plan  Patient Instructions  Continue on Symbicort and Spiriva daily .  Remain on Prednisone 10mg  daily .  Mucinex DM Twice daily  As needed  Cough/congestion  May use Albuterol Neb as needed .  Follow up with Dr. Vassie LollAlva  In 3-4 months and As needed

## 2017-08-06 NOTE — Assessment & Plan Note (Signed)
Severe COPD - compensated - he is very deconditioned with (CHF, CKD and DM )   Plan  Patient Instructions  Continue on Symbicort and Spiriva daily .  Remain on Prednisone 10mg  daily .  Mucinex DM Twice daily  As needed  Cough/congestion  May use Albuterol Neb as needed .  Follow up with Dr. Vassie LollAlva  In 3-4 months and As needed

## 2017-08-06 NOTE — Progress Notes (Signed)
@Patient  ID: Greg Jones, male    DOB: [birth year], 84 y.o.   MRN: [id number]  Chief Complaint  Patient presents with  . Follow-up    COPD    Referring provider: Margot Ables*  HPI: 84 year old male former smoker followed her COPD Patient has a history of hypertension, chronic kidney disease stage IV, congestive heart failure, AAA  Significant tests/ events -reviewed  PFTs 10/2014 showed FEV1 of 0.84-39%, FVC of 2.1-67%, without significant broncho-dilator response. DLCO was relatively preserved at 73%.  04/2015 Spirometry showed FEV1 of 34%-0.81  08/06/2017 Follow up ; COPD  Patient presents for a 58-month follow-up for severe COPD.  Since last visit patient says he is doing about the same.  He gets winded with minimum activity.  Has intermittent cough and congestion.  He denies any fever chest pain orthopnea PND or discolored mucus  He remains on Symbicort and Spiriva.  He remains on Prednisone 10mg  daily  Chest x-ray December 2018 showed chronic changes. Flu, Prevnar and PVX are utd.   Recently dx with DM . Started on Lantus . BS are better  Has chronic kidney disease .   Allergies  Allergen Reactions  . Other Other (See Comments)    Oxygen Sensitive  . Penicillins Rash and Other (See Comments)    Any -illins as well as Mycins Has patient had a PCN reaction causing immediate rash, facial/tongue/throat swelling, SOB or lightheadedness with hypotension: Yes Has patient had a PCN reaction causing severe rash involving mucus membranes or skin necrosis: Yes Has patient had a PCN reaction that required hospitalization No Has patient had a PCN reaction occurring within the last 10 years: No If all of the above answers are "NO", then may proceed with Cephalosporin use.   . Vitamin B12 Itching, Rash and Other (See Comments)    injections    Immunization History  Administered Date(s) Administered  . Influenza, High Dose Seasonal PF 05/06/2017  .  Influenza,inj,Quad PF,6+ Mos 02/15/2016  . Influenza-Unspecified 05/03/2015  . Pneumococcal Conjugate-13 04/19/2016  . Pneumococcal Polysaccharide-23 09/18/2015    Past Medical History:  Diagnosis Date  . CHF (congestive heart failure) (HCC)   . Chronic kidney disease   . COPD (chronic obstructive pulmonary disease) (HCC)   . GERD (gastroesophageal reflux disease)   . Hypertension   . Respiratory insufficiency     Tobacco History: Social History   Tobacco Use  Smoking Status Former Smoker  . Packs/day: 0.50  . Years: 70.00  . Pack years: 35.00  . Types: Cigarettes  . Last attempt to quit: 04/19/2013  . Years since quitting: 4.3  Smokeless Tobacco Never Used   Counseling given: Not Answered   Outpatient Encounter Medications as of 08/06/2017  Medication Sig  . albuterol (PROVENTIL) (2.5 MG/3ML) 0.083% nebulizer solution Take 3 mLs (2.5 mg total) by nebulization every 6 (six) hours. DX:  J44.9  . amLODipine (NORVASC) 5 MG tablet Take 1 tablet (5 mg total) by mouth daily.  [name] aspirin EC 81 MG tablet Take 81 mg by mouth daily.  . budesonide-formoterol (SYMBICORT) 160-4.5 MCG/ACT inhaler Inhale 2 puffs into the lungs 2 (two) times daily.  . Cholecalciferol (VITAMIN D3) 1000 units CAPS Take 1 capsule by mouth daily.  . furosemide (LASIX) 20 MG tablet Take 2 tablets (40 mg total) by mouth daily.  . insulin glargine (LANTUS) 100 UNIT/ML injection Inject 14 Units into the skin at bedtime.  . isosorbide mononitrate (IMDUR) 30 MG 24 hr tablet Take 30 mg by mouth daily.  [name]  loratadine (CLARITIN) 10 MG tablet Take 10 mg by mouth daily.  [name]. omeprazole (PRILOSEC) 20 MG capsule Take 20 mg by mouth daily.  . predniSONE (DELTASONE) 10 MG tablet 20mg  X5 days, 15mg  X14 days, then 10mg  daily (Patient taking differently: Take 10-20 mg by mouth as directed. 20mg  X5 days, 15mg  X14 days, then 10mg  daily)  . tiotropium (SPIRIVA) 18 MCG inhalation capsule Place 18 mcg into inhaler and inhale daily.  [name].  triamcinolone cream (KENALOG) 0.1 % Apply 1 application topically 2 (two) times daily.  . [DISCONTINUED] metFORMIN (GLUCOPHAGE) 500 MG tablet Take 1 tablet (500 mg total) by mouth 2 (two) times daily. 1 p.o. daily while taking higher doses of prednisone. (Patient not taking: Reported on 08/06/2017)   No facility-administered encounter medications on file as of 08/06/2017.      Review of Systems  Constitutional:   No  weight loss, night sweats,  Fevers, chills,  +fatigue, or  lassitude.  HEENT:   No headaches,  Difficulty swallowing,  Tooth/dental problems, or  Sore throat,                No sneezing, itching, ear ache, nasal congestion, post nasal drip,   CV:  No chest pain,  Orthopnea, PND, swelling in lower extremities, anasarca, dizziness, palpitations, syncope.   GI  No heartburn, indigestion, abdominal pain, nausea, vomiting, diarrhea, change in bowel habits, loss of appetite, bloody stools.   Resp:   No chest wall deformity  Skin: no rash or lesions.  GU: no dysuria, change in color of urine, no urgency or frequency.  No flank pain, no hematuria   MS:  No joint pain or swelling.  No decreased range of motion.  No back pain.    Physical Exam  BP 128/84 (BP Location: Left Arm, Cuff Size: Normal)   Pulse (!) 105   Ht 5' 7.5" (1.715 m)   Wt 193 lb (87.5 kg)   SpO2 98%   BMI 29.78 kg/m   GEN: A/Ox3; pleasant , NAD, elderly , walker    HEENT:  [place]/AT,  EACs-clear, TMs-wnl, NOSE-clear, THROAT-clear, no lesions, no postnasal drip or exudate noted.   NECK:  Supple w/ fair ROM; no JVD; normal carotid impulses w/o bruits; no thyromegaly or nodules palpated; no lymphadenopathy.    RESP  Few trace rhonchi   no accessory muscle use, no dullness to percussion  CARD:  RRR, no m/r/g, 1+  peripheral edema, pulses intact, no cyanosis or clubbing.  GI:   Soft & nt; nml bowel sounds; no organomegaly or masses detected.   Musco: Warm bil, no deformities or joint swelling noted.    Neuro: alert, no focal deficits noted.    Skin: Warm, no lesions or rashes    Lab Results:   ProBNP No results found for: PROBNP  Imaging: No results found.   Assessment & Plan:   COPD (chronic obstructive pulmonary disease) (HCC) Severe COPD - compensated - he is very deconditioned with (CHF, CKD and DM )   Plan  Patient Instructions  Continue on Symbicort and Spiriva daily .  Remain on Prednisone 10mg  daily .  Mucinex DM Twice daily  As needed  Cough/congestion  May use Albuterol Neb as needed .  Follow up with Dr. Vassie LollAlva  In 3-4 months and As needed         Chronic combined systolic and diastolic CHF (congestive heart failure) (HCC) Compensated on present regimen   Plan  Patient Instructions  Continue on Symbicort and Spiriva  daily .  Remain on Prednisone 10mg  daily .  Mucinex DM Twice daily  As needed  Cough/congestion  May use Albuterol Neb as needed .  Follow up with Dr. Vassie Loll  In 3-4 months and As needed            Rubye Oaks, NP 08/06/2017

## 2017-08-06 NOTE — Patient Instructions (Addendum)
Continue on Symbicort and Spiriva daily .  Remain on Prednisone 10mg  daily .  Mucinex DM Twice daily  As needed  Cough/congestion  May use Albuterol Neb as needed .  Follow up with Dr. Vassie LollAlva  In 3-4 months and As needed

## 2017-08-14 ENCOUNTER — Encounter (HOSPITAL_COMMUNITY): Payer: Self-pay | Admitting: Emergency Medicine

## 2017-08-14 ENCOUNTER — Emergency Department (HOSPITAL_COMMUNITY): Payer: Medicare Other

## 2017-08-14 ENCOUNTER — Inpatient Hospital Stay (HOSPITAL_COMMUNITY)
Admission: EM | Admit: 2017-08-14 | Discharge: 2017-08-22 | DRG: 190 | Disposition: A | Discharge status: Home Health | Payer: Medicare Other | Attending: Internal Medicine | Admitting: Internal Medicine

## 2017-08-14 DIAGNOSIS — I248 Other forms of acute ischemic heart disease: Secondary | ICD-10-CM | POA: Diagnosis present

## 2017-08-14 DIAGNOSIS — L299 Pruritus, unspecified: Secondary | ICD-10-CM | POA: Diagnosis present

## 2017-08-14 DIAGNOSIS — Z880 Allergy status to penicillin: Secondary | ICD-10-CM | POA: Diagnosis not present

## 2017-08-14 DIAGNOSIS — Z794 Long term (current) use of insulin: Secondary | ICD-10-CM

## 2017-08-14 DIAGNOSIS — R600 Localized edema: Secondary | ICD-10-CM | POA: Diagnosis present

## 2017-08-14 DIAGNOSIS — M10072 Idiopathic gout, left ankle and foot: Secondary | ICD-10-CM | POA: Diagnosis not present

## 2017-08-14 DIAGNOSIS — I5043 Acute on chronic combined systolic (congestive) and diastolic (congestive) heart failure: Secondary | ICD-10-CM | POA: Diagnosis present

## 2017-08-14 DIAGNOSIS — M79675 Pain in left toe(s): Secondary | ICD-10-CM | POA: Diagnosis not present

## 2017-08-14 DIAGNOSIS — Z7982 Long term (current) use of aspirin: Secondary | ICD-10-CM

## 2017-08-14 DIAGNOSIS — M7989 Other specified soft tissue disorders: Secondary | ICD-10-CM

## 2017-08-14 DIAGNOSIS — I130 Hypertensive heart and chronic kidney disease with heart failure and stage 1 through stage 4 chronic kidney disease, or unspecified chronic kidney disease: Secondary | ICD-10-CM | POA: Diagnosis present

## 2017-08-14 DIAGNOSIS — Z87891 Personal history of nicotine dependence: Secondary | ICD-10-CM | POA: Diagnosis not present

## 2017-08-14 DIAGNOSIS — D72829 Elevated white blood cell count, unspecified: Secondary | ICD-10-CM

## 2017-08-14 DIAGNOSIS — I4581 Long QT syndrome: Secondary | ICD-10-CM | POA: Diagnosis present

## 2017-08-14 DIAGNOSIS — E1122 Type 2 diabetes mellitus with diabetic chronic kidney disease: Secondary | ICD-10-CM | POA: Diagnosis present

## 2017-08-14 DIAGNOSIS — J449 Chronic obstructive pulmonary disease, unspecified: Secondary | ICD-10-CM

## 2017-08-14 DIAGNOSIS — N184 Chronic kidney disease, stage 4 (severe): Secondary | ICD-10-CM | POA: Diagnosis present

## 2017-08-14 DIAGNOSIS — M109 Gout, unspecified: Secondary | ICD-10-CM | POA: Diagnosis present

## 2017-08-14 DIAGNOSIS — J9601 Acute respiratory failure with hypoxia: Secondary | ICD-10-CM | POA: Diagnosis not present

## 2017-08-14 DIAGNOSIS — Z833 Family history of diabetes mellitus: Secondary | ICD-10-CM | POA: Diagnosis not present

## 2017-08-14 DIAGNOSIS — J441 Chronic obstructive pulmonary disease with (acute) exacerbation: Principal | ICD-10-CM | POA: Diagnosis present

## 2017-08-14 DIAGNOSIS — K219 Gastro-esophageal reflux disease without esophagitis: Secondary | ICD-10-CM | POA: Diagnosis present

## 2017-08-14 DIAGNOSIS — J690 Pneumonitis due to inhalation of food and vomit: Secondary | ICD-10-CM | POA: Diagnosis not present

## 2017-08-14 DIAGNOSIS — L282 Other prurigo: Secondary | ICD-10-CM | POA: Diagnosis not present

## 2017-08-14 DIAGNOSIS — Z7951 Long term (current) use of inhaled steroids: Secondary | ICD-10-CM

## 2017-08-14 DIAGNOSIS — I5042 Chronic combined systolic (congestive) and diastolic (congestive) heart failure: Secondary | ICD-10-CM | POA: Diagnosis present

## 2017-08-14 DIAGNOSIS — R740 Nonspecific elevation of levels of transaminase and lactic acid dehydrogenase [LDH]: Secondary | ICD-10-CM | POA: Diagnosis present

## 2017-08-14 DIAGNOSIS — R140 Abdominal distension (gaseous): Secondary | ICD-10-CM

## 2017-08-14 DIAGNOSIS — R609 Edema, unspecified: Secondary | ICD-10-CM | POA: Diagnosis not present

## 2017-08-14 LAB — I-STAT TROPONIN, ED: TROPONIN I, POC: 0.01 ng/mL (ref 0.00–0.08)

## 2017-08-14 LAB — BASIC METABOLIC PANEL
ANION GAP: 15 (ref 5–15)
BUN: 25 mg/dL — ABNORMAL HIGH (ref 6–20)
CALCIUM: 9.4 mg/dL (ref 8.9–10.3)
CO2: 25 mmol/L (ref 22–32)
Chloride: 103 mmol/L (ref 101–111)
Creatinine, Ser: 2.24 mg/dL — ABNORMAL HIGH (ref 0.61–1.24)
GFR, EST AFRICAN AMERICAN: 28 mL/min — AB (ref >60)
GFR, EST NON AFRICAN AMERICAN: 25 mL/min — AB (ref >60)
Glucose, Bld: 161 mg/dL — ABNORMAL HIGH (ref 65–99)
Potassium: 4 mmol/L (ref 3.5–5.1)
Sodium: 143 mmol/L (ref 135–145)

## 2017-08-14 LAB — CBC
HCT: 40.6 % (ref 39.0–52.0)
HEMOGLOBIN: 12.5 g/dL — AB (ref 13.0–17.0)
MCH: 27.9 pg (ref 26.0–34.0)
MCHC: 30.8 g/dL (ref 30.0–36.0)
MCV: 90.6 fL (ref 78.0–100.0)
Platelets: 301 10*3/uL (ref 150–400)
RBC: 4.48 MIL/uL (ref 4.22–5.81)
RDW: 17.2 % — ABNORMAL HIGH (ref 11.5–15.5)
WBC: 13.5 10*3/uL — AB (ref 4.0–10.5)

## 2017-08-14 LAB — BRAIN NATRIURETIC PEPTIDE: B Natriuretic Peptide: 96.7 pg/mL (ref 0.0–100.0)

## 2017-08-14 LAB — URIC ACID: URIC ACID, SERUM: 11.8 mg/dL — AB (ref 4.4–7.6)

## 2017-08-14 MED ORDER — METHYLPREDNISOLONE SODIUM SUCC 125 MG IJ SOLR
60.0000 mg | Freq: Three times a day (TID) | INTRAMUSCULAR | Status: DC
  Administered 2017-08-15 – 2017-08-20 (×17): 60 mg via INTRAVENOUS
  Filled 2017-08-14 (×18): qty 2

## 2017-08-14 MED ORDER — PANTOPRAZOLE SODIUM 40 MG PO TBEC
40.0000 mg | DELAYED_RELEASE_TABLET | Freq: Every day | ORAL | Status: DC
  Administered 2017-08-15 – 2017-08-22 (×8): 40 mg via ORAL
  Filled 2017-08-14 (×8): qty 1

## 2017-08-14 MED ORDER — INSULIN ASPART 100 UNIT/ML ~~LOC~~ SOLN
0.0000 [IU] | Freq: Three times a day (TID) | SUBCUTANEOUS | Status: DC
  Administered 2017-08-15: 5 [IU] via SUBCUTANEOUS
  Administered 2017-08-15: 2 [IU] via SUBCUTANEOUS
  Administered 2017-08-15: 1 [IU] via SUBCUTANEOUS
  Administered 2017-08-16: 3 [IU] via SUBCUTANEOUS
  Administered 2017-08-16: 2 [IU] via SUBCUTANEOUS
  Administered 2017-08-16: 1 [IU] via SUBCUTANEOUS
  Administered 2017-08-17: 2 [IU] via SUBCUTANEOUS
  Administered 2017-08-17: 3 [IU] via SUBCUTANEOUS
  Administered 2017-08-17 – 2017-08-18 (×4): 2 [IU] via SUBCUTANEOUS
  Administered 2017-08-19: 1 [IU] via SUBCUTANEOUS
  Administered 2017-08-19: 2 [IU] via SUBCUTANEOUS
  Administered 2017-08-19: 3 [IU] via SUBCUTANEOUS
  Administered 2017-08-20: 5 [IU] via SUBCUTANEOUS
  Administered 2017-08-20 (×2): 2 [IU] via SUBCUTANEOUS
  Administered 2017-08-21: 3 [IU] via SUBCUTANEOUS
  Administered 2017-08-21: 2 [IU] via SUBCUTANEOUS
  Administered 2017-08-21: 13:00:00 5 [IU] via SUBCUTANEOUS
  Administered 2017-08-22: 3 [IU] via SUBCUTANEOUS
  Administered 2017-08-22: 5 [IU] via SUBCUTANEOUS
  Filled 2017-08-14 (×2): qty 1

## 2017-08-14 MED ORDER — INSULIN GLARGINE 100 UNIT/ML ~~LOC~~ SOLN
14.0000 [IU] | Freq: Every day | SUBCUTANEOUS | Status: DC
  Administered 2017-08-15 – 2017-08-21 (×8): 14 [IU] via SUBCUTANEOUS
  Filled 2017-08-14 (×12): qty 0.14

## 2017-08-14 MED ORDER — GUAIFENESIN ER 600 MG PO TB12
600.0000 mg | ORAL_TABLET | Freq: Two times a day (BID) | ORAL | Status: DC
  Administered 2017-08-15 – 2017-08-22 (×16): 600 mg via ORAL
  Filled 2017-08-14 (×16): qty 1

## 2017-08-14 MED ORDER — BUDESONIDE 0.5 MG/2ML IN SUSP
0.5000 mg | Freq: Two times a day (BID) | RESPIRATORY_TRACT | Status: DC
  Administered 2017-08-14 – 2017-08-22 (×16): 0.5 mg via RESPIRATORY_TRACT
  Filled 2017-08-14 (×15): qty 2

## 2017-08-14 MED ORDER — LEVOFLOXACIN IN D5W 250 MG/50ML IV SOLN
250.0000 mg | INTRAVENOUS | Status: DC
  Administered 2017-08-15 – 2017-08-16 (×2): 250 mg via INTRAVENOUS
  Filled 2017-08-14 (×4): qty 50

## 2017-08-14 MED ORDER — IPRATROPIUM-ALBUTEROL 0.5-2.5 (3) MG/3ML IN SOLN
3.0000 mL | Freq: Four times a day (QID) | RESPIRATORY_TRACT | Status: DC
  Administered 2017-08-14 – 2017-08-15 (×2): 3 mL via RESPIRATORY_TRACT
  Filled 2017-08-14: qty 3

## 2017-08-14 MED ORDER — FUROSEMIDE 10 MG/ML IJ SOLN
40.0000 mg | INTRAMUSCULAR | Status: AC
  Administered 2017-08-15: 40 mg via INTRAVENOUS
  Filled 2017-08-14: qty 4

## 2017-08-14 MED ORDER — AMLODIPINE BESYLATE 5 MG PO TABS
5.0000 mg | ORAL_TABLET | Freq: Every day | ORAL | Status: DC
  Administered 2017-08-15 – 2017-08-17 (×3): 5 mg via ORAL
  Filled 2017-08-14 (×3): qty 1

## 2017-08-14 MED ORDER — HEPARIN SODIUM (PORCINE) 5000 UNIT/ML IJ SOLN
5000.0000 [IU] | Freq: Three times a day (TID) | INTRAMUSCULAR | Status: DC
  Administered 2017-08-15 – 2017-08-22 (×22): 5000 [IU] via SUBCUTANEOUS
  Filled 2017-08-14 (×23): qty 1

## 2017-08-14 MED ORDER — BUDESONIDE 0.5 MG/2ML IN SUSP
RESPIRATORY_TRACT | Status: AC
  Filled 2017-08-14: qty 2

## 2017-08-14 MED ORDER — ALBUTEROL (5 MG/ML) CONTINUOUS INHALATION SOLN
10.0000 mg/h | INHALATION_SOLUTION | Freq: Once | RESPIRATORY_TRACT | Status: AC
  Administered 2017-08-14: 10 mg/h via RESPIRATORY_TRACT
  Filled 2017-08-14: qty 20

## 2017-08-14 MED ORDER — ONDANSETRON HCL 4 MG PO TABS: 4.0000 mg | ORAL_TABLET | Freq: Four times a day (QID) | ORAL | Status: DC | PRN

## 2017-08-14 MED ORDER — ONDANSETRON HCL 4 MG/2ML IJ SOLN: 4.0000 mg | Freq: Four times a day (QID) | INTRAMUSCULAR | Status: DC | PRN

## 2017-08-14 MED ORDER — ACETAMINOPHEN 325 MG PO TABS
650.0000 mg | ORAL_TABLET | Freq: Four times a day (QID) | ORAL | Status: DC | PRN
  Filled 2017-08-14: qty 2

## 2017-08-14 MED ORDER — LORATADINE 10 MG PO TABS
10.0000 mg | ORAL_TABLET | Freq: Every day | ORAL | Status: DC
  Administered 2017-08-15 – 2017-08-22 (×8): 10 mg via ORAL
  Filled 2017-08-14 (×8): qty 1

## 2017-08-14 MED ORDER — METHYLPREDNISOLONE SODIUM SUCC 125 MG IJ SOLR
125.0000 mg | Freq: Once | INTRAMUSCULAR | Status: AC
  Administered 2017-08-14: 125 mg via INTRAVENOUS
  Filled 2017-08-14: qty 2

## 2017-08-14 MED ORDER — ARFORMOTEROL TARTRATE 15 MCG/2ML IN NEBU
15.0000 ug | INHALATION_SOLUTION | Freq: Two times a day (BID) | RESPIRATORY_TRACT | Status: DC
  Administered 2017-08-14 – 2017-08-22 (×15): 15 ug via RESPIRATORY_TRACT
  Filled 2017-08-14 (×18): qty 2

## 2017-08-14 MED ORDER — LEVOFLOXACIN IN D5W 500 MG/100ML IV SOLN
500.0000 mg | Freq: Once | INTRAVENOUS | Status: AC
  Administered 2017-08-15: 500 mg via INTRAVENOUS
  Filled 2017-08-14: qty 100

## 2017-08-14 MED ORDER — ISOSORBIDE MONONITRATE ER 30 MG PO TB24
30.0000 mg | ORAL_TABLET | Freq: Every day | ORAL | Status: DC
  Administered 2017-08-15 – 2017-08-22 (×8): 30 mg via ORAL
  Filled 2017-08-14 (×8): qty 1

## 2017-08-14 MED ORDER — ASPIRIN EC 81 MG PO TBEC
81.0000 mg | DELAYED_RELEASE_TABLET | Freq: Every day | ORAL | Status: DC
  Administered 2017-08-15 – 2017-08-22 (×8): 81 mg via ORAL
  Filled 2017-08-14 (×9): qty 1

## 2017-08-14 MED ORDER — TRIAMCINOLONE ACETONIDE 0.1 % EX CREA
1.0000 "application " | TOPICAL_CREAM | Freq: Two times a day (BID) | CUTANEOUS | Status: DC
  Administered 2017-08-15 – 2017-08-22 (×13): 1 via TOPICAL
  Filled 2017-08-14 (×3): qty 15

## 2017-08-14 MED ORDER — IPRATROPIUM-ALBUTEROL 0.5-2.5 (3) MG/3ML IN SOLN
3.0000 mL | RESPIRATORY_TRACT | Status: DC | PRN
  Administered 2017-08-16 – 2017-08-18 (×3): 3 mL via RESPIRATORY_TRACT
  Filled 2017-08-14 (×6): qty 3

## 2017-08-14 MED ORDER — ACETAMINOPHEN 650 MG RE SUPP: 650.0000 mg | Freq: Four times a day (QID) | RECTAL | Status: DC | PRN

## 2017-08-14 MED ORDER — DIPHENHYDRAMINE HCL 50 MG/ML IJ SOLN
12.5000 mg | Freq: Once | INTRAMUSCULAR | Status: AC
  Administered 2017-08-14: 12.5 mg via INTRAVENOUS
  Filled 2017-08-14: qty 1

## 2017-08-14 NOTE — ED Triage Notes (Addendum)
Per EMS, patient coming from home c/o SOB with productive cough x5 days. Denies chest pain, N/V/D.  Breathing treatment with EMS.  20g R AC

## 2017-08-14 NOTE — ED Notes (Signed)
Bed: WA10 Expected date:  Expected time:  Means of arrival:  Comments: EMS-SOB 

## 2017-08-14 NOTE — ED Notes (Signed)
Attempted to ambulate patient with pulse ox. Pt immediately got very SOB and started audibly wheezing. He states that he needs a minute before he can try to get up. MD made aware.

## 2017-08-14 NOTE — H&P (Signed)
History and Physical    Greg Jones YTK:[id number] DOB: 09/08/1927 DOA: 08/14/2017  Referring MD/NP/PA: Dr. Marye Round PCP: Buzzy Han, MD  Patient coming from:  Home via EMS  Chief Complaint: Shortness of breath  I have personally briefly reviewed patient's old medical records in [place]   HPI: Greg Jones is a 84 y.o. male with medical history significant of HTN, CHF, COPD on chronic steroids since 2017, DM Type II diagnosed in 05/2017, CKD stage IV, history of tobacco use, and gerd; who presents with complaints of worsening shortness of breath over the last 3-4 days.  Patient reports having a minimally productive cough with thick yellowish-green sputum production.  Shortness of breath symptoms seem stable at rest, but worsened with any exertion.  Patient reports utilizing his albuterol nebulizer 2-3 times per night which is unusual for him.  He is not on oxygen at baseline.  Associated symptoms include abdominal distention, weight gain of 8-10 lbs in 2 week, decrease urine output, mild lower extremity swelling, and a diffuse red itchy rash of his entire body that has presented itself in the last 2-3 weeks.  He was seen at the [facility] and given some ointment which helps some with his itching symptoms, but rash has not significantly clear.  The left toe has also turned red, but has not got into see the endocrinologist.  denies having any fever, chills, nausea, vomiting, diarrhea, or chest pain.  He reports not being on oxygen at home.  En route with EMS patient was given breathing treatment.  ED Course: Upon admission into the emergency department patient was seen to be afebrile, pulse 101-120, respirations 18-22, blood pressure 117/94-147/90, and O2 saturation is 95-98%.  Labs reveal WBC 13.5, hbg 12.5, BUN 25, Cr 2.24, bnp 96.7, and trop 0.01.  Chest x-ray showed mild cardiomegaly without signs of infiltrate or significant edema.  Patient was given 1 hour long  breathing treatment, Benadryl, and 125 mg of Solu-Medrol IV.  After trying to ambulate the patient he became significantly winded and short of breath.  TRH called to admit.   Review of Systems  Constitutional: Positive for malaise/fatigue. Negative for chills and fever.       Positive for weight gain  HENT: Negative for ear discharge and nosebleeds.   Eyes: Negative for photophobia and pain.  Respiratory: Positive for cough, shortness of breath and wheezing.   Cardiovascular: Positive for leg swelling. Negative for chest pain.  Gastrointestinal: Negative for abdominal pain.       Positive for abdominal distention  Genitourinary: Negative for dysuria and hematuria.  Musculoskeletal: Negative for falls.  Skin: Positive for itching and rash.  Endo/Heme/Allergies: Bruises/bleeds easily.  Psychiatric/Behavioral: Negative for substance abuse.    Past Medical History:  Diagnosis Date  . CHF (congestive heart failure) ([place])   . Chronic kidney disease   . COPD (chronic obstructive pulmonary disease) ([place])   . GERD (gastroesophageal reflux disease)   . Hypertension   . Respiratory insufficiency     History reviewed. No pertinent surgical history.   reports that he quit smoking about 4 years ago. His smoking use included cigarettes. He has a 35.00 pack-year smoking history. he has never used smokeless tobacco. He reports that he does not drink alcohol or use drugs.  Allergies  Allergen Reactions  . Other Other (See Comments)    Oxygen Sensitive  . Penicillins Rash and Other (See Comments)    Any -illins as well as Mycins Has patient had a  PCN reaction causing immediate rash, facial/tongue/throat swelling, SOB or lightheadedness with hypotension: Yes Has patient had a PCN reaction causing severe rash involving mucus membranes or skin necrosis: Yes Has patient had a PCN reaction that required hospitalization No Has patient had a PCN reaction occurring within the last 10 years: No If all  of the above answers are "NO", then may proceed with Cephalosporin use.   . Vitamin B12 Itching, Rash and Other (See Comments)    injections    Family History  Problem Relation Age of Onset  . Tuberculosis Mother   . Diabetes Father     Prior to Admission medications   Medication Sig Start Date End Date Taking? Authorizing Provider  albuterol (PROVENTIL) (2.5 MG/3ML) 0.083% nebulizer solution Take 3 mLs (2.5 mg total) by nebulization every 6 (six) hours. DX:  J44.9 03/23/17  Yes Rigoberto Noel, MD  amLODipine (NORVASC) 5 MG tablet Take 1 tablet (5 mg total) by mouth daily. 09/14/15  Yes Erick Colace, NP  aspirin EC 81 MG tablet Take 81 mg by mouth daily.   Yes [provider]  budesonide-formoterol (SYMBICORT) 160-4.5 MCG/ACT inhaler Inhale 2 puffs into the lungs 2 (two) times daily.   Yes [provider]  Cholecalciferol (VITAMIN D3) 1000 units CAPS Take 1 capsule by mouth daily.   Yes [provider]  furosemide (LASIX) 20 MG tablet Take 2 tablets (40 mg total) by mouth daily. 09/11/16  Yes Dixie Dials, MD  insulin glargine (LANTUS) 100 UNIT/ML injection Inject 14 Units into the skin at bedtime.   Yes [provider]  isosorbide mononitrate (IMDUR) 30 MG 24 hr tablet Take 30 mg by mouth daily.   Yes [provider]  loratadine (CLARITIN) 10 MG tablet Take 10 mg by mouth daily.   Yes [provider]  omeprazole (PRILOSEC) 20 MG capsule Take 20 mg by mouth daily.   Yes [provider]  predniSONE (DELTASONE) 10 MG tablet 59m X5 days, 187mX14 days, then 1060maily Patient taking differently: Take 10-20 mg by mouth as directed. 72m92m days, 15mg58m days, then 10mg 56my 05/07/17  Yes Alva, Rigoberto Noeltiotropium (SPIRIVA) 18 MCG inhalation capsule Place 18 mcg into inhaler and inhale daily.   Yes [provider]  triamcinolone cream (KENALOG) 0.1 % Apply 1 application topically 2 (two) times daily.   Yes  [provider]    Physical Exam:  Constitutional: Elderly male who appears to be in some moderate respiratory distress Vitals:   08/14/17 1800 08/14/17 1900 08/14/17 2000 08/14/17 2020  BP: (!) 117/101 (!) 117/94 (!) 146/78 137/78  Pulse: (!) 109 (!) 118 (!) 105 (!) 101  Resp: 20 (!) 21 (!) 22 18  Temp:      TempSrc:      SpO2: 96% 95% 98% 96%   Eyes: PERRL, lids and conjunctivae normal ENMT: Mucous membranes are moist. Posterior pharynx clear of any exudate or lesions. Poor dentition.  Neck: normal, supple, no masses, no thyromegaly Respiratory: Decreased air movement with  expiratory wheeze appreciated.  Patient only able to talk  in shortened sentences at this time.  No accessory muscle usage seen. Cardiovascular: Tachycardic with intermittent extra beats, no rubs / gallops.  Trace extremity edema. 2+ pedal pulses. No carotid bruits.  Abdomen: Moderate distention, no masses palpated. No hepatosplenomegaly. Bowel sounds positive.  Musculoskeletal: no clubbing / cyanosis. No joint deformity upper and lower extremities. Good ROM, no contractures. Normal muscle tone.  Skin: Diffuse rash present of the upper and lower extremities with excoriations and areas of bleeding present. Redness and erythema of left great toe. Neurologic: CN 2-12 grossly intact. Sensation intact, DTR normal. Strength 5/5 in all 4.  Psychiatric: Normal judgment and insight. Alert and oriented x 3. Normal mood.     Labs on Admission: I have personally reviewed following labs and imaging studies  CBC: Recent Labs  Lab 08/14/17 1717  WBC 13.5*  HGB 12.5*  HCT 40.6  MCV 90.6  PLT 409   Basic Metabolic Panel: Recent Labs  Lab 08/14/17 1717  NA 143  K 4.0  CL 103  CO2 25  GLUCOSE 161*  BUN 25*  CREATININE 2.24*  CALCIUM 9.4   GFR: Estimated Creatinine Clearance: 24.3 mL/min (A) (by C-G formula based on SCr of 2.24 mg/dL (H)). Liver Function Tests: No results for input(s): AST, ALT,  ALKPHOS, BILITOT, PROT, ALBUMIN in the last 168 hours. No results for input(s): LIPASE, AMYLASE in the last 168 hours. No results for input(s): AMMONIA in the last 168 hours. Coagulation Profile: No results for input(s): INR, PROTIME in the last 168 hours. Cardiac Enzymes: No results for input(s): CKTOTAL, CKMB, CKMBINDEX, TROPONINI in the last 168 hours. BNP (last 3 results) No results for input(s): PROBNP in the last 8760 hours. HbA1C: No results for input(s): HGBA1C in the last 72 hours. CBG: No results for input(s): GLUCAP in the last 168 hours. Lipid Profile: No results for input(s): CHOL, HDL, LDLCALC, TRIG, CHOLHDL, LDLDIRECT in the last 72 hours. Thyroid Function Tests: No results for input(s): TSH, T4TOTAL, FREET4, T3FREE, THYROIDAB in the last 72 hours. Anemia Panel: No results for input(s): VITAMINB12, FOLATE, FERRITIN, TIBC, IRON, RETICCTPCT in the last 72 hours. Urine analysis:    Component Value Date/Time   COLORURINE STRAW (A) 05/30/2017 1826   APPEARANCEUR CLEAR 05/30/2017 1826   LABSPEC 1.012 05/30/2017 1826   PHURINE 6.0 05/30/2017 1826   GLUCOSEU >=500 (A) 05/30/2017 1826   HGBUR NEGATIVE 05/30/2017 1826   BILIRUBINUR NEGATIVE 05/30/2017 1826   KETONESUR 5 (A) 05/30/2017 1826   PROTEINUR NEGATIVE 05/30/2017 1826   NITRITE NEGATIVE 05/30/2017 1826   LEUKOCYTESUR NEGATIVE 05/30/2017 1826   Sepsis Labs: No results found for this or any previous visit (from the past 240 hour(s)).   Radiological Exams on Admission: Dg Chest Port 1 View  Result Date: 08/14/2017 CLINICAL DATA:  84 year old male with shortness of breath and productive cough for 5 days. Initial encounter. EXAM: PORTABLE CHEST 1 VIEW COMPARISON:  05/30/2017 chest x-ray. FINDINGS: No infiltrate, congestive heart failure or pneumothorax. Mild cardiomegaly. Tortuous minimally calcified thoracic aorta. No acute osseous abnormality. IMPRESSION: No infiltrate or congestive heart failure. Aortic  Atherosclerosis (ICD10-I70.0).  Tortuous aorta. Mild cardiomegaly. Electronically Signed   By: Genia Del M.D.   On: 08/14/2017 17:07    EKG: Independently reviewed.  Sinus tachycardia at 124 bpm with intermittent PVCs  Assessment/Plan COPD exacerbation: Acute on chronic.  Presents with complaints of progressively worsening shortness of breath and cough.  Chest x-ray shows mild cardiomegaly with no clear signs of infiltrate or pulmonary edema.  Patient was given 125 mg of Solu-Medrol and hour-long continuous neb without improvement in symptoms.  Patient sees Dr. Elsworth Soho of pulmonology in the outpatient setting and has been on chronic steroids. - Admit to a telemetry bed - Continuous pulse oximetry overnight with nasal cannula oxygen as needed - Duonebs 4 times daily and prn Sob/wheezing - Brovana and budesonide nebulizer treatments twice daily -  Levaquin 500 mg IV x1 dose, then 250 mg IV q daily - Mucinex - Physical therapy to ambulate in a.m. - May want to notify Dr. Elsworth Soho of pulmonology in a.m.  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  Patient reports having weight gain over the last few weeks of 8-10 lbs with abdominal swelling.  On physical exam patient has significant abdominal distention with positive fluid wave and trace lower extremity edema.  BNP 96.7.  Last echocardiogram showed EF of 45-50% with grade 3 diastolic dysfunction with mild aortic stenosis on 09/08/2016.  Patient is followed by Dr. Dixie Dials.  Suspect patient could be acutely retaining fluid. - Strict intake and output  - Daily weights - Check troponin and TSH  - Lasix 40 mg IV x 1 dose, reassess need for continued IV diuresis in a.m. - Continue isosorbide mononitrate  - Message sent for cardiology to eval in a.m.   Left great toe erythema and swelling: Acute. Question possibility of cellulitis vs. gout flare - Add on uric acid level and CRP - Antibiotics as seen  above  Leukocytosis: Chronic.  WBC 17 E 13.5 on admission, but appears to be chronically elevated upon review of records and he has been taking steroids. - Continue to monitor  Pruritic rash: Acute.  Patient reports having generalized itchy rash for the last 2-3 weeks - Continue Kenalog cream - Add on ESR  - May likely warrant referral to dermatology  Diabetes mellitus type 2: Patient had just been diagnosed back in December currently on Lantus nightly. - Hypoglycemic protocols - Continue Lantus per home dose - CBGs q. before meals with sensitive SSI, adjust regimen as needed  Chronic kidney disease stage IV: Stable.  Patient creatinine 2.24 on admission and baseline creatinine ranges from 2.1-2.8. - Continue to monitor  Essential hypertension: Blood pressures stable. - Continue home medication  GERD - Continue pharmacy substitution of Protonix  DVT prophylaxis: heparin   Code Status: full  Family Communication: Discussed plan of care with patient and family present at bedside Disposition Plan: Likely discharge home in 2-3 days Consults called: None Admission status: Inpatient  Norval Morton MD [facility] Pager [phone]   If 7PM-7AM, please contact night-coverage [email or web address] Password [facility]  08/14/2017, 9:05 PM

## 2017-08-14 NOTE — ED Provider Notes (Signed)
[facility]-EMERGENCY DEPT Provider Note   CSN: [id number] Arrival date & time: 08/14/17  1603     History   Chief Complaint Chief Complaint  Patient presents with  . Shortness of Breath    HPI Greg Jones is a 84 y.o. male.  HPI Patient presents to the emergency room with complaints of shortness of breath and cough.  Patient has a history of COPD.  He has had a little bit of cough for the last 5 days but really did not feel like he was developing any significant respiratory infection.  This morning however he woke up feeling very short of breath.  He does not feel too bad when he is at rest but whenever he tries to move around he gets worse.  He denies any chest pain.  He denies any fever.  No vomiting or diarrhea.  Patient is not normally on oxygen at home.  The only other symptoms he has been having is a bit of a rash.  He saw his doctor for this and was told it could be a fungal infection.  He does not feel like his got much better but he does feel very itchy with the rash. Past Medical History:  Diagnosis Date  . CHF (congestive heart failure) ([facility])   . Chronic kidney disease   . COPD (chronic obstructive pulmonary disease) ([facility])   . GERD (gastroesophageal reflux disease)   . Hypertension   . Respiratory insufficiency     Patient Active Problem List   Diagnosis Date Noted  . Pressure injury of skin 04/12/2016  . Near syncope 04/11/2016  . Pre-syncope 04/11/2016  . CKD (chronic kidney disease), stage IV ([facility]) 04/11/2016  . QT prolongation 04/11/2016  . Hypokalemia 04/11/2016  . Blood glucose elevated 04/11/2016  . Chronic combined systolic and diastolic CHF (congestive heart failure) ([facility]) 01/11/2016  . Dyspnea   . COPD exacerbation ([facility]) 09/07/2015  . GERD (gastroesophageal reflux disease) 05/28/2015  . COPD (chronic obstructive pulmonary disease) ([facility]) 05/10/2015  . Essential hypertension 05/10/2015    History reviewed. No pertinent  surgical history.     Home Medications    Prior to Admission medications   Medication Sig Start Date End Date Taking? Authorizing Provider  albuterol (PROVENTIL) (2.5 MG/3ML) 0.083% nebulizer solution Take 3 mLs (2.5 mg total) by nebulization every 6 (six) hours. DX:  J44.9 03/23/17  Yes Oretha Milch, MD  amLODipine (NORVASC) 5 MG tablet Take 1 tablet (5 mg total) by mouth daily. 09/14/15  Yes Simonne Martinet, NP  aspirin EC 81 MG tablet Take 81 mg by mouth daily.   Yes [provider]  budesonide-formoterol (SYMBICORT) 160-4.5 MCG/ACT inhaler Inhale 2 puffs into the lungs 2 (two) times daily.   Yes [provider]  Cholecalciferol (VITAMIN D3) 1000 units CAPS Take 1 capsule by mouth daily.   Yes [provider]  furosemide (LASIX) 20 MG tablet Take 2 tablets (40 mg total) by mouth daily. 09/11/16  Yes Orpah Cobb, MD  insulin glargine (LANTUS) 100 UNIT/ML injection Inject 14 Units into the skin at bedtime.   Yes [provider]  isosorbide mononitrate (IMDUR) 30 MG 24 hr tablet Take 30 mg by mouth daily.   Yes [provider]  loratadine (CLARITIN) 10 MG tablet Take 10 mg by mouth daily.   Yes [provider]  omeprazole (PRILOSEC) 20 MG capsule Take 20 mg by mouth daily.   Yes [provider]  predniSONE (DELTASONE) 10 MG tablet  20mg  X5 days, 15mg  X14 days, then 10mg  daily Patient taking differently: Take 10-20 mg by mouth as directed. 20mg  X5 days, 15mg  X14 days, then 10mg  daily 05/07/17  Yes Oretha Milch, MD  tiotropium (SPIRIVA) 18 MCG inhalation capsule Place 18 mcg into inhaler and inhale daily.   Yes [provider]  triamcinolone cream (KENALOG) 0.1 % Apply 1 application topically 2 (two) times daily.   Yes [provider]    Family History Family History  Problem Relation Age of Onset  . Tuberculosis Mother   . Diabetes Father     Social History Social History   Tobacco Use  . Smoking  status: Former Smoker    Packs/day: 0.50    Years: 70.00    Pack years: 35.00    Types: Cigarettes    Last attempt to quit: 04/19/2013    Years since quitting: 4.3  . Smokeless tobacco: Never Used  Substance Use Topics  . Alcohol use: No    Alcohol/week: 0.0 oz  . Drug use: No     Allergies   Other; Penicillins; and Vitamin b12   Review of Systems Review of Systems  All other systems reviewed and are negative.    Physical Exam Updated Vital Signs BP 137/78   Pulse (!) 101   Temp 98 F (36.7 C) (Oral)   Resp 18   SpO2 96%   Physical Exam  Constitutional:  Non-toxic appearance. He does not appear ill. No distress.  HENT:  Head: Normocephalic and atraumatic.  Right Ear: External ear normal.  Left Ear: External ear normal.  Eyes: Conjunctivae are normal. Right eye exhibits no discharge. Left eye exhibits no discharge. No scleral icterus.  Neck: Neck supple. No tracheal deviation present.  Cardiovascular: Regular rhythm and intact distal pulses. Tachycardia present.  Pulmonary/Chest: Effort normal. No stridor. No respiratory distress. He has wheezes. He has no rales.  Abdominal: Soft. Bowel sounds are normal. He exhibits no distension. There is no tenderness. There is no rebound and no guarding.  Musculoskeletal: He exhibits no tenderness.       Right lower leg: He exhibits edema.       Left lower leg: He exhibits edema.  Neurological: He is alert. He has normal strength. No cranial nerve deficit (no facial droop, extraocular movements intact, no slurred speech) or sensory deficit. He exhibits normal muscle tone. He displays no seizure activity. Coordination normal.  Skin: Skin is warm and dry. Rash (mild diffuse erythema, no urticaria) noted.  Psychiatric: He has a normal mood and affect.  Nursing note and vitals reviewed.    ED Treatments / Results  Labs (all labs ordered are listed, but only abnormal results are displayed) Labs Reviewed  BASIC METABOLIC PANEL -  Abnormal; Notable for the following components:      Result Value   Glucose, Bld 161 (*)    BUN 25 (*)    Creatinine, Ser 2.24 (*)    GFR calc non Af Amer 25 (*)    GFR calc Af Amer 28 (*)    All other components within normal limits  CBC - Abnormal; Notable for the following components:   WBC 13.5 (*)    Hemoglobin 12.5 (*)    RDW 17.2 (*)    All other components within normal limits  BRAIN NATRIURETIC PEPTIDE  I-STAT TROPONIN, ED    EKG  EKG Interpretation  Date/Time:  Tuesday August 14 2017 16:18:27 EST Ventricular Rate:  124 PR Interval:    QRS  Duration: 81 QT Interval:  350 QTC Calculation: 463 R Axis:   -19 Text Interpretation:  Sinus tachycardia Multiform ventricular premature complexes Borderline left axis deviation Borderline low voltage, extremity leads Repolarization abnormality, prob rate related Since last tracing rate faster Confirmed by Linwood DibblesKnapp, Macklin Jacquin [phone]) on 08/14/2017 4:34:38 PM       Radiology Dg Chest Port 1 View  Result Date: 08/14/2017 CLINICAL DATA:  84 year old male with shortness of breath and productive cough for 5 days. Initial encounter. EXAM: PORTABLE CHEST 1 VIEW COMPARISON:  05/30/2017 chest x-ray. FINDINGS: No infiltrate, congestive heart failure or pneumothorax. Mild cardiomegaly. Tortuous minimally calcified thoracic aorta. No acute osseous abnormality. IMPRESSION: No infiltrate or congestive heart failure. Aortic Atherosclerosis (ICD10-I70.0).  Tortuous aorta. Mild cardiomegaly. Electronically Signed   By: Lacy DuverneySteven  Olson M.D.   On: 08/14/2017 17:07    Procedures Procedures (including critical care time)  Medications Ordered in ED Medications  albuterol (PROVENTIL,VENTOLIN) solution continuous neb (10 mg/hr Nebulization Given 08/14/17 1654)  methylPREDNISolone sodium succinate (SOLU-MEDROL) 125 mg/2 mL injection 125 mg (125 mg Intravenous Given 08/14/17 1725)  diphenhydrAMINE (BENADRYL) injection 12.5 mg (12.5 mg Intravenous Given 08/14/17  1726)     Initial Impression / Assessment and Plan / ED Course  I have reviewed the triage vital signs and the nursing notes.  Pertinent labs & imaging results that were available during my care of the patient were reviewed by me and considered in my medical decision making (see chart for details).  Clinical Course as of Aug 14 2098  Tue Aug 14, 2017  1753 No pna or CHF on CXR  [JK]  1958 Slight wheeze on exam however patient is feeling much better.  Will try to have him walk around and check his oxygen saturation during ambulation  [JK]  2058 Patient attempted to get up and walk.  He became very short of breath just sitting up at the side of the bed.  [JK]  2059 Labs reviewed.  Renal insufficiency is stable.  Slight leukocytosis noted on white blood cell count  [JK]    Clinical Course User Index [JK] Linwood DibblesKnapp, Sharnay Cashion, MD   Patient presented to the emergency room for evaluation of cough and shortness of breath.  Patient had notable wheezing on exam.  He does have COPD.  Patient was treated with albuterol Atrovent and steroids.  He continues to feel short of breath.  He remains mildly tachycardic.  Patient is improving but I think he will need at least overnight treatment.  I will consult with the medical service for admission.  Final Clinical Impressions(s) / ED Diagnoses   Final diagnoses:  COPD exacerbation (HCC)      Linwood DibblesKnapp, Amneet Cendejas, MD 08/14/17 2100

## 2017-08-15 ENCOUNTER — Inpatient Hospital Stay (HOSPITAL_COMMUNITY): Payer: Medicare Other

## 2017-08-15 ENCOUNTER — Other Ambulatory Visit: Payer: Self-pay

## 2017-08-15 LAB — ECHOCARDIOGRAM COMPLETE
CHL CUP STROKE VOLUME: 27 mL
E decel time: 165 msec
E/e' ratio: 14.22
FS: 26 % — AB (ref 28–44)
IV/PV OW: 1.31
LA ID, A-P, ES: 20 mm
LA diam end sys: 20 mm
LA vol index: 13.5 mL/m2
LADIAMINDEX: 1 cm/m2
LAVOL: 27 mL
LAVOLA4C: 24.4 mL
LDCA: 3.46 cm2
LV E/e' medial: 14.22
LV E/e'average: 14.22
LV PW d: 9.49 mm — AB (ref 0.6–1.1)
LV sys vol index: 7 mL/m2
LVDIAVOL: 40 mL — AB (ref 62–150)
LVDIAVOLIN: 20 mL/m2
LVOT SV: 76 mL
LVOT VTI: 21.9 cm
LVOT peak vel: 109 cm/s
LVOTD: 21 mm
LVSYSVOL: 14 mL — AB (ref 21–61)
MV Dec: 165
MV pk A vel: 140 m/s
MV pk E vel: 116 m/s
MVPG: 5 mmHg
RV LATERAL S' VELOCITY: 14.7 cm/s
Simpson's disk: 66
TDI e' medial: 8.16

## 2017-08-15 LAB — GLUCOSE, CAPILLARY: Glucose-Capillary: 229 mg/dL — ABNORMAL HIGH (ref 65–99)

## 2017-08-15 LAB — COMPREHENSIVE METABOLIC PANEL
ALBUMIN: 3.8 g/dL (ref 3.5–5.0)
ALT: 49 U/L (ref 17–63)
AST: 47 U/L — AB (ref 15–41)
Alkaline Phosphatase: 65 U/L (ref 38–126)
Anion gap: 13 (ref 5–15)
BILIRUBIN TOTAL: 0.6 mg/dL (ref 0.3–1.2)
BUN: 28 mg/dL — AB (ref 6–20)
CHLORIDE: 101 mmol/L (ref 101–111)
CO2: 24 mmol/L (ref 22–32)
CREATININE: 2.41 mg/dL — AB (ref 0.61–1.24)
Calcium: 9.4 mg/dL (ref 8.9–10.3)
GFR calc Af Amer: 26 mL/min — ABNORMAL LOW (ref >60)
GFR, EST NON AFRICAN AMERICAN: 22 mL/min — AB (ref >60)
GLUCOSE: 148 mg/dL — AB (ref 65–99)
Potassium: 4 mmol/L (ref 3.5–5.1)
Sodium: 138 mmol/L (ref 135–145)
Total Protein: 7.6 g/dL (ref 6.5–8.1)

## 2017-08-15 LAB — TROPONIN I
TROPONIN I: 0.03 ng/mL — AB (ref <0.03)
Troponin I: 0.03 ng/mL (ref <0.03)
Troponin I: 0.03 ng/mL (ref <0.03)

## 2017-08-15 LAB — CBG MONITORING, ED
Glucose-Capillary: 144 mg/dL — ABNORMAL HIGH (ref 65–99)
Glucose-Capillary: 174 mg/dL — ABNORMAL HIGH (ref 65–99)

## 2017-08-15 LAB — SEDIMENTATION RATE: Sed Rate: 40 mm/hr — ABNORMAL HIGH (ref 0–16)

## 2017-08-15 LAB — CBC
HEMATOCRIT: 38.9 % — AB (ref 39.0–52.0)
Hemoglobin: 12.6 g/dL — ABNORMAL LOW (ref 13.0–17.0)
MCH: 28.4 pg (ref 26.0–34.0)
MCHC: 32.4 g/dL (ref 30.0–36.0)
MCV: 87.8 fL (ref 78.0–100.0)
PLATELETS: 250 10*3/uL (ref 150–400)
RBC: 4.43 MIL/uL (ref 4.22–5.81)
RDW: 16.9 % — ABNORMAL HIGH (ref 11.5–15.5)
WBC: 12.1 10*3/uL — AB (ref 4.0–10.5)

## 2017-08-15 LAB — TSH: TSH: 0.8 u[IU]/mL (ref 0.350–4.500)

## 2017-08-15 LAB — C-REACTIVE PROTEIN: CRP: 1.3 mg/dL — ABNORMAL HIGH (ref <1.0)

## 2017-08-15 MED ORDER — IPRATROPIUM-ALBUTEROL 0.5-2.5 (3) MG/3ML IN SOLN
3.0000 mL | Freq: Three times a day (TID) | RESPIRATORY_TRACT | Status: DC
  Administered 2017-08-15 – 2017-08-16 (×3): 3 mL via RESPIRATORY_TRACT
  Filled 2017-08-15 (×3): qty 3

## 2017-08-15 MED ORDER — FUROSEMIDE 10 MG/ML IJ SOLN
40.0000 mg | Freq: Two times a day (BID) | INTRAMUSCULAR | Status: AC
  Administered 2017-08-15 – 2017-08-16 (×2): 40 mg via INTRAVENOUS
  Filled 2017-08-15 (×2): qty 4

## 2017-08-15 MED ORDER — PERFLUTREN LIPID MICROSPHERE
INTRAVENOUS | Status: AC
Start: 2017-08-15 — End: ?
  Filled 2017-08-15: qty 10

## 2017-08-15 MED ORDER — PERFLUTREN LIPID MICROSPHERE
1.0000 mL | INTRAVENOUS | Status: AC | PRN
Start: 2017-08-15 — End: 2017-08-15
  Administered 2017-08-15: 2 mL via INTRAVENOUS
  Filled 2017-08-15: qty 10

## 2017-08-15 MED ORDER — LIDOCAINE HCL 2 % EX GEL
1.0000 "application " | Freq: Once | CUTANEOUS | Status: DC | PRN
  Filled 2017-08-15: qty 5

## 2017-08-15 NOTE — Progress Notes (Signed)
  Echocardiogram 2D Echocardiogram has been performed.  Janalyn HarderWest, Yizel Canby R 08/15/2017, 3:05 PM

## 2017-08-15 NOTE — ED Notes (Signed)
Bladder scan wasn't done due pt void 

## 2017-08-15 NOTE — Progress Notes (Signed)
Pharmacy Note: Miscellaneous consult  Per MD: Patient has splotchy erythematous maculopapular skin rash on torso mostly, any medications implicated? was there on admission?  Home meds include: Prednisone taper day 13 of 14. No abx prior to admission Patient received Levaquin 500mg  x1 on 2/26 pm - Previous Levaquin use March 2018 and January 2019   Estimated Creatinine Clearance: 22.6 mL/min (A) (by C-G formula based on SCr of 2.41 mg/dL (H)).   Allergies  Allergen Reactions  . Other Other (See Comments)    Oxygen Sensitive  . Penicillins Rash and Other (See Comments)    Any -illins as well as Mycins Has patient had a PCN reaction causing immediate rash, facial/tongue/throat swelling, SOB or lightheadedness with hypotension: Yes Has patient had a PCN reaction causing severe rash involving mucus membranes or skin necrosis: Yes Has patient had a PCN reaction that required hospitalization No Has patient had a PCN reaction occurring within the last 10 years: No If all of the above answers are "NO", then may proceed with Cephalosporin use.   . Vitamin B12 Itching, Rash and Other (See Comments)    injections    Vitals:   08/15/17 0706 08/15/17 0754  BP: (!) 147/77   Pulse: 85 100  Resp: 18 20  Temp:    SpO2: 96% 99%    Anti-infectives (From admission, onward)   Start     Dose/Rate Route Frequency Ordered Stop   08/15/17 2200  Levofloxacin (LEVAQUIN) IVPB 250 mg     250 mg 50 mL/hr over 60 Minutes Intravenous Every 24 hours 08/14/17 2220     08/14/17 2230  levofloxacin (LEVAQUIN) IVPB 500 mg     500 mg 100 mL/hr over 60 Minutes Intravenous  Once 08/14/17 2218 08/15/17 0250      Otho BellowsGreen, Manroop Jakubowicz L, [facility] HospitalRPH 08/15/2017 11:25 AM

## 2017-08-15 NOTE — ED Notes (Signed)
Blood noted leaking around IV site in R AC.

## 2017-08-15 NOTE — ED Notes (Signed)
ED TO INPATIENT HANDOFF REPORT  Name/Age/Gender Greg Jones 84 y.o. male  Code Status    Code Status Orders  (From admission, onward)        Start     Ordered   08/14/17 2200  Full code  Continuous     08/14/17 2209    Code Status History    Date Active Date Inactive Code Status Order ID Comments User Context   09/07/2016 18:20 09/11/2016 17:21 Full Code [id number]  Dixie Dials, MD Inpatient   04/11/2016 18:50 04/12/2016 19:50 Full Code [id number]  Florencia Reasons, MD Inpatient   03/15/2016 18:39 03/18/2016 15:39 Full Code [id number]  Dixie Dials, MD Inpatient   09/07/2015 15:54 09/14/2015 17:12 Full Code [id number]  Melvenia Needles, NP Inpatient      Home/SNF/Other Home  Chief Complaint Shortness of Breath  Level of Care/Admitting Diagnosis ED Disposition    ED Disposition Condition [place] [facility] Area: [facility] [[id number]]  Level of Care: Telemetry [5]  Admit to tele based on following criteria: Complex arrhythmia (Bradycardia/Tachycardia)  Diagnosis: COPD exacerbation [facility]) [[id number]]  Admitting Physician: Norval Morton [[id number]]  Attending Physician: Norval Morton [[id number]]  Estimated length of stay: 3 - 4 days  Certification:: I certify this patient will need inpatient services for at least 2 midnights  PT Class (Do Not Modify): Inpatient [101]  PT Acc Code (Do Not Modify): Private [1]       Medical History Past Medical History:  Diagnosis Date  . CHF (congestive heart failure) ([place])   . Chronic kidney disease   . COPD (chronic obstructive pulmonary disease) ([place])   . GERD (gastroesophageal reflux disease)   . Hypertension   . Respiratory insufficiency     Allergies Allergies  Allergen Reactions  . Other Other (See Comments)    Oxygen Sensitive  . Penicillins Rash and Other (See Comments)    Any -illins as well as Mycins Has patient had a PCN reaction causing immediate rash, facial/tongue/throat swelling, SOB or  lightheadedness with hypotension: Yes Has patient had a PCN reaction causing severe rash involving mucus membranes or skin necrosis: Yes Has patient had a PCN reaction that required hospitalization No Has patient had a PCN reaction occurring within the last 10 years: No If all of the above answers are "NO", then may proceed with Cephalosporin use.   . Vitamin B12 Itching, Rash and Other (See Comments)    injections    IV Location/Drains/Wounds Patient Lines/Drains/Airways Status   Active Line/Drains/Airways    Name:   Placement date:   Placement time:   Site:   Days:   Peripheral IV 08/15/17 Right;Lateral Wrist   08/15/17    0115    Wrist   less than 1   Pressure Injury 09/07/16 small pus filled blisters   09/07/16    1614     342          Labs/Imaging Results for orders placed or performed during the hospital encounter of 08/14/17 (from the past 48 hour(s))  Basic metabolic panel     Status: Abnormal   Collection Time: 08/14/17  5:17 PM  Result Value Ref Range   Sodium 143 135 - 145 mmol/L   Potassium 4.0 3.5 - 5.1 mmol/L   Chloride 103 101 - 111 mmol/L   CO2 25 22 - 32 mmol/L   Glucose, Bld 161 (H) 65 - 99 mg/dL   BUN 25 (H) 6 - 20 mg/dL   Creatinine, Ser  2.24 (H) 0.61 - 1.24 mg/dL   Calcium 9.4 8.9 - 10.3 mg/dL   GFR calc non Af Amer 25 (L) >60 mL/min   GFR calc Af Amer 28 (L) >60 mL/min    Comment: (NOTE) The eGFR has been calculated using the CKD EPI equation. This calculation has not been validated in all clinical situations. eGFR's persistently <60 mL/min signify possible Chronic Kidney Disease.    Anion gap 15 5 - 15    Comment: Performed at [facility], [place] [address] [place], [place] [address]  CBC     Status: Abnormal   Collection Time: 08/14/17  5:17 PM  Result Value Ref Range   WBC 13.5 (H) 4.0 - 10.5 K/uL   RBC 4.48 4.22 - 5.81 MIL/uL   Hemoglobin 12.5 (L) 13.0 - 17.0 g/dL   HCT 40.6 39.0 - 52.0 %   MCV 90.6 78.0 - 100.0 fL   MCH  27.9 26.0 - 34.0 pg   MCHC 30.8 30.0 - 36.0 g/dL   RDW 17.2 (H) 11.5 - 15.5 %   Platelets 301 150 - 400 K/uL    Comment: Performed at [facility], [place] [address] [place], [place] [address]  Brain natriuretic peptide     Status: None   Collection Time: 08/14/17  5:17 PM  Result Value Ref Range   B Natriuretic Peptide 96.7 0.0 - 100.0 pg/mL    Comment: Performed at [facility], [place] [address] [place], [place] [address]  I-stat troponin, ED  (not at [facility], [facility])     Status: None   Collection Time: 08/14/17  5:40 PM  Result Value Ref Range   Troponin i, poc 0.01 0.00 - 0.08 ng/mL   Comment 3            Comment: Due to the release kinetics of cTnI, a negative result within the first hours of the onset of symptoms does not rule out myocardial infarction with certainty. If myocardial infarction is still suspected, repeat the test at appropriate intervals.   Uric acid     Status: Abnormal   Collection Time: 08/14/17  6:47 PM  Result Value Ref Range   Uric Acid, Serum 11.8 (H) 4.4 - 7.6 mg/dL    Comment: Performed at [facility], [place] [address] [place], [place] [address]  Troponin I     Status: Abnormal   Collection Time: 08/14/17 10:07 PM  Result Value Ref Range   Troponin I 0.03 (HH) <0.03 ng/mL    Comment: CRITICAL RESULT CALLED TO, READ BACK BY AND VERIFIED WITH: TEUP,M RN 2.27.19 @0251  ZANDO,C Performed at [facility], [place] [address], [place], [place] [address]   TSH     Status: None   Collection Time: 08/15/17  7:46 AM  Result Value Ref Range   TSH 0.800 0.350 - 4.500 uIU/mL    Comment: Performed by a 3rd Generation assay with a functional sensitivity of <=0.01 uIU/mL. Performed at [facility], [place] [address] [place], [place] [address]   Troponin I     Status: Abnormal   Collection Time: 08/15/17  7:46 AM  Result Value Ref Range   Troponin I 0.03 (HH) <0.03 ng/mL     Comment: CRITICAL VALUE NOTED.  VALUE IS CONSISTENT WITH PREVIOUSLY REPORTED AND CALLED VALUE. Performed at [facility], [place] [address], [place], [place] [address]   CBC     Status: Abnormal   Collection Time: 08/15/17  7:46 AM  Result Value Ref Range   WBC 12.1 (H) 4.0 - 10.5 K/uL   RBC 4.43 4.22 - 5.81 MIL/uL   Hemoglobin 12.6 (L) 13.0 - 17.0 g/dL   HCT 38.9 (L) 39.0 - 52.0 %   MCV 87.8 78.0 - 100.0 fL   MCH 28.4 26.0 - 34.0 pg   MCHC 32.4 30.0 - 36.0 g/dL   RDW 16.9 (H) 11.5 - 15.5 %   Platelets 250 150 - 400 K/uL    Comment: Performed at [facility], [place] [address], [place], [place] [address]  Comprehensive metabolic panel     Status: Abnormal   Collection Time: 08/15/17  7:46 AM  Result Value Ref Range   Sodium 138 135 - 145 mmol/L   Potassium 4.0 3.5 - 5.1 mmol/L   Chloride 101 101 - 111 mmol/L   CO2 24 22 - 32 mmol/L   Glucose, Bld 148 (H) 65 - 99 mg/dL   BUN 28 (H) 6 - 20 mg/dL   Creatinine, Ser 2.41 (H) 0.61 - 1.24 mg/dL   Calcium 9.4 8.9 - 10.3 mg/dL   Total Protein 7.6 6.5 - 8.1 g/dL   Albumin 3.8 3.5 - 5.0 g/dL   AST 47 (H) 15 - 41 U/L   ALT 49 17 - 63 U/L   Alkaline Phosphatase 65 38 - 126 U/L   Total Bilirubin 0.6 0.3 - 1.2 mg/dL   GFR calc non Af Amer 22 (L) >60 mL/min   GFR calc Af Amer 26 (L) >60 mL/min    Comment: (NOTE) The eGFR has been calculated using the CKD EPI equation. This calculation has not been validated in all clinical situations. eGFR's persistently <60 mL/min signify possible Chronic Kidney Disease.    Anion gap 13 5 - 15    Comment: Performed at [facility], [place] [address], [place], [place] [address]  C-reactive protein     Status: Abnormal   Collection Time: 08/15/17  7:46 AM  Result Value Ref Range   CRP 1.3 (H) <1.0 mg/dL    Comment: Performed at [address], [place], [place] [address]  Sedimentation rate     Status: Abnormal   Collection Time:  08/15/17  7:46 AM  Result Value Ref Range   Sed Rate 40 (H) 0 - 16 mm/hr    Comment: Performed at [facility], [place] [address] [place], [place] [address]  CBG monitoring, ED     Status: Abnormal   Collection Time: 08/15/17  8:04 AM  Result Value Ref Range   Glucose-Capillary 144 (H) 65 - 99 mg/dL  Troponin I     Status: Abnormal   Collection Time: 08/15/17 10:45 AM  Result Value Ref Range   Troponin I 0.03 (HH) <0.03 ng/mL    Comment: CRITICAL VALUE NOTED.  VALUE IS CONSISTENT WITH PREVIOUSLY REPORTED AND CALLED VALUE. Performed at [facility], [place] [address] [place], [place] [address]   CBG monitoring, ED     Status: Abnormal   Collection Time: 08/15/17 12:16 PM  Result Value Ref Range   Glucose-Capillary 174 (H) 65 - 99 mg/dL   Dg Chest Port 1 View  Result Date: 08/14/2017 CLINICAL DATA:  84 year old male with shortness of breath and productive cough for 5 days. Initial encounter. EXAM: PORTABLE CHEST 1 VIEW COMPARISON:  05/30/2017 chest x-ray. FINDINGS: No infiltrate, congestive heart failure or pneumothorax. Mild cardiomegaly. Tortuous minimally calcified thoracic aorta. No acute osseous  abnormality. IMPRESSION: No infiltrate or congestive heart failure. Aortic Atherosclerosis (ICD10-I70.0).  Tortuous aorta. Mild cardiomegaly. Electronically Signed   By: Genia Del M.D.   On: 08/14/2017 17:07    Pending Labs Unresulted Labs (From admission, onward)   None      Vitals/Pain Today's Vitals   08/15/17 1200 08/15/17 1300 08/15/17 1456 08/15/17 1505  BP: (!) 163/87 (!) 132/107  (!) 123/100  Pulse: 93 98 98 (!) 102  Resp: 18 (!) 21 16 19   Temp:      TempSrc:      SpO2: 95% 98% 98% 99%    Isolation Precautions No active isolations  Medications Medications  aspirin EC tablet 81 mg (81 mg Oral Given 08/15/17 1129)  amLODipine (NORVASC) tablet 5 mg (5 mg Oral Given 08/15/17 1129)  insulin glargine (LANTUS) injection 14 Units (14  Units Subcutaneous Given 08/15/17 0044)  loratadine (CLARITIN) tablet 10 mg (10 mg Oral Given 08/15/17 1129)  pantoprazole (PROTONIX) EC tablet 40 mg (40 mg Oral Given 08/15/17 1129)  triamcinolone cream (KENALOG) 0.1 % 1 application (1 application Topical Given 08/15/17 1131)  isosorbide mononitrate (IMDUR) 24 hr tablet 30 mg (30 mg Oral Given 08/15/17 1129)  arformoterol (BROVANA) nebulizer solution 15 mcg (15 mcg Nebulization Not Given 08/15/17 0753)  budesonide (PULMICORT) nebulizer solution 0.5 mg (0.5 mg Nebulization Given 08/15/17 0754)  ipratropium-albuterol (DUONEB) 0.5-2.5 (3) MG/3ML nebulizer solution 3 mL (not administered)  methylPREDNISolone sodium succinate (SOLU-MEDROL) 125 mg/2 mL injection 60 mg (60 mg Intravenous Given 08/15/17 1130)  heparin injection 5,000 Units (5,000 Units Subcutaneous Given 08/15/17 0752)  ondansetron (ZOFRAN) tablet 4 mg (not administered)    Or  ondansetron (ZOFRAN) injection 4 mg (not administered)  acetaminophen (TYLENOL) tablet 650 mg (not administered)    Or  acetaminophen (TYLENOL) suppository 650 mg (not administered)  guaiFENesin (MUCINEX) 12 hr tablet 600 mg (600 mg Oral Given 08/15/17 1130)  Levofloxacin (LEVAQUIN) IVPB 250 mg (not administered)  insulin aspart (novoLOG) injection 0-9 Units (2 Units Subcutaneous Given 08/15/17 1249)  budesonide (PULMICORT) 0.5 MG/2ML nebulizer solution (  Not Given 08/14/17 2242)  lidocaine (XYLOCAINE) 2 % jelly 1 application (not administered)  ipratropium-albuterol (DUONEB) 0.5-2.5 (3) MG/3ML nebulizer solution 3 mL (3 mLs Nebulization Given 08/15/17 1456)  furosemide (LASIX) injection 40 mg (40 mg Intravenous Given 08/15/17 1250)  perflutren lipid microspheres (DEFINITY) IV suspension (2 mLs Intravenous Given 08/15/17 1440)  albuterol (PROVENTIL,VENTOLIN) solution continuous neb (10 mg/hr Nebulization Given 08/14/17 1654)  methylPREDNISolone sodium succinate (SOLU-MEDROL) 125 mg/2 mL injection 125 mg (125 mg Intravenous  Given 08/14/17 1725)  diphenhydrAMINE (BENADRYL) injection 12.5 mg (12.5 mg Intravenous Given 08/14/17 1726)  furosemide (LASIX) injection 40 mg (40 mg Intravenous Given 08/15/17 0044)  levofloxacin (LEVAQUIN) IVPB 500 mg (0 mg Intravenous Stopped 08/15/17 0250)    Mobility

## 2017-08-15 NOTE — Progress Notes (Signed)
[facility] Progress Note  Subjective: breathing somewhat better, l toe pain somewhat better  Vitals:   08/15/17 0420 08/15/17 0600 08/15/17 0706 08/15/17 0754  BP: (!) 163/78 (!) 150/60 (!) 147/77   Pulse: 92 82 85 100  Resp: 20  18 20   Temp:      TempSrc:      SpO2: 98% 98% 96% 99%    Inpatient medications: . amLODipine  5 mg Oral Daily  . arformoterol  15 mcg Nebulization BID  . aspirin EC  81 mg Oral Daily  . budesonide      . budesonide (PULMICORT) nebulizer solution  0.5 mg Nebulization BID  . guaiFENesin  600 mg Oral BID  . heparin  5,000 Units Subcutaneous Q8H  . insulin aspart  0-9 Units Subcutaneous TID WC  . insulin glargine  14 Units Subcutaneous QHS  . ipratropium-albuterol  3 mL Nebulization TID  . isosorbide mononitrate  30 mg Oral Daily  . loratadine  10 mg Oral Daily  . methylPREDNISolone (SOLU-MEDROL) injection  60 mg Intravenous Q8H  . pantoprazole  40 mg Oral Daily  . triamcinolone cream  1 application Topical BID   . levofloxacin (LEVAQUIN) IV     acetaminophen **OR** acetaminophen, ipratropium-albuterol, lidocaine, ondansetron **OR** ondansetron (ZOFRAN) IV  Exam: Alert elderly AAM No distress No jvd Chest decreased air movement throughout, mild exp wheezing, no use of acc muscles Cor tachy, reg no mrg Abd protuberant, obese, nontender, no ascites or hsm Ext L great toe MCP joint red and tender, no LE edema Skin patchy maculopapular rash on upper torso, abdomen, chest    Brief Summary: 84 yo male hx CHF, HTN, COPD, CKD IV, steroid-induced DM2 , tobacco , gerd. Is on long term prednisone since 2017 for COPD.  Presented 2/26 with SOB^, thick greenish sputum, DOE, Shirl Harris use.  Not on home O2.  Also reported diffuse body rash and painful joint 1st toe on left foot.  IN ED RR 22, CXR negative, pt wheezing. rx'd with IV steroids, alb nebs, benadryl and admitted.    Home meds: -norvasc/ lasix 40 qd/ imdur 30 / ecasa 81 -albuterol nebs/ symbicort  bid/ claritin/ pred 10 mg daily/ spiriva 18 ug qd -kenalog 0.1% cream -PPI -lantus 14 u hs      Problems: COPD exacerbation Acute / chronic diast CHF Acute gout L great toe DM2 CKD IV HTN Pruritic rash     Assessment/ Plan:  COPD exacerbation/ acute on chronic - feeling a bit better -cont nebs, IV steroids -CXR neg, +sputum change, on IV levaquin   Acute/ chronic diast CHF - voided good amts overnight after IV lasix x 1. CXR neg.  - give another 40 mg IV lasix x 2 today, still abd distension  Gout L great toe - better on IV steroids, uric acid 11.8^^, follow, no nsaids w ckd 4  Skin rash - looks like drug rash -cont kenalog -ask pharm to review his meds   DM2 , insulin-dependent -cont lantus and SSI ac tid  CKD IV - creatinine is at baseline, 2.4- 2.8  HTN - decent control, cont norvasc, diurese a bit   Vinson Moselle MD [facility] pgr [phone] 08/15/2017, 10:35 AM   Code Status: full  Family Communication: Discussed plan of care with patient Disposition Plan: Likely discharge home in 1-2 days Consults called: None Admission status: Inpatient         Recent Labs  Lab 08/14/17 1717 08/15/17 0746  NA 143 138  K 4.0 4.0  CL 103 101  CO2 25 24  GLUCOSE 161* 148*  BUN 25* 28*  CREATININE 2.24* 2.41*  CALCIUM 9.4 9.4   Recent Labs  Lab 08/15/17 0746  AST 47*  ALT 49  ALKPHOS 65  BILITOT 0.6  PROT 7.6  ALBUMIN 3.8   Recent Labs  Lab 08/14/17 1717 08/15/17 0746  WBC 13.5* 12.1*  HGB 12.5* 12.6*  HCT 40.6 38.9*  MCV 90.6 87.8  PLT 301 250   Iron/TIBC/Ferritin/ %Sat No results found for: IRON, TIBC, FERRITIN, IRONPCTSAT

## 2017-08-15 NOTE — ED Notes (Signed)
IV site noted leaking while administering solumedrol.  Pt possibly did not receive the medicine.  Kristen on-coming RN made aware and she will notify MD.

## 2017-08-15 NOTE — Progress Notes (Signed)
PT Cancellation Note  Patient Details Name: Greg RobertsJonathan Jones MRN: [id number] DOB: 08/12/1927   Cancelled Treatment:    Reason Eval/Treat Not Completed: Other (comment)  Being transferred to [facility] .   Rada HayHill, Hatice Bubel Elizabeth 08/15/2017, 4:09 PM Blanchard KelchKaren Kieran Arreguin PT [phone]

## 2017-08-16 DIAGNOSIS — M10072 Idiopathic gout, left ankle and foot: Secondary | ICD-10-CM

## 2017-08-16 LAB — GLUCOSE, CAPILLARY
GLUCOSE-CAPILLARY: 192 mg/dL — AB (ref 65–99)
GLUCOSE-CAPILLARY: 219 mg/dL — AB (ref 65–99)
GLUCOSE-CAPILLARY: 190 mg/dL — AB (ref 65–99)
Glucose-Capillary: 151 mg/dL — ABNORMAL HIGH (ref 65–99)
Glucose-Capillary: 149 mg/dL — ABNORMAL HIGH (ref 65–99)

## 2017-08-16 MED ORDER — IPRATROPIUM-ALBUTEROL 0.5-2.5 (3) MG/3ML IN SOLN
3.0000 mL | Freq: Four times a day (QID) | RESPIRATORY_TRACT | Status: DC
  Administered 2017-08-16 – 2017-08-17 (×4): 3 mL via RESPIRATORY_TRACT
  Filled 2017-08-16 (×4): qty 3

## 2017-08-16 MED ORDER — FUROSEMIDE 40 MG PO TABS
40.0000 mg | ORAL_TABLET | Freq: Every day | ORAL | Status: DC
  Administered 2017-08-16 – 2017-08-18 (×3): 40 mg via ORAL
  Filled 2017-08-16 (×3): qty 1

## 2017-08-16 NOTE — Progress Notes (Signed)
[facility] Progress Note  Subjective: walked w/ PT in halls 75 feet,  SpO2 89-95%, had to come back in a WC for SOB. Feeling better, still wheezing a lot  Vitals:   08/16/17 0629 08/16/17 0738 08/16/17 0740 08/16/17 0942  BP:    140/71  Pulse:    72  Resp:    18  Temp:    97.9 F (36.6 C)  TempSrc:    Oral  SpO2:  95% 95% 94%  Weight: 85.7 kg (188 lb 15 oz)     Height:        Inpatient medications: . amLODipine  5 mg Oral Daily  . arformoterol  15 mcg Nebulization BID  . aspirin EC  81 mg Oral Daily  . budesonide (PULMICORT) nebulizer solution  0.5 mg Nebulization BID  . guaiFENesin  600 mg Oral BID  . heparin  5,000 Units Subcutaneous Q8H  . insulin aspart  0-9 Units Subcutaneous TID WC  . insulin glargine  14 Units Subcutaneous QHS  . ipratropium-albuterol  3 mL Nebulization TID  . isosorbide mononitrate  30 mg Oral Daily  . loratadine  10 mg Oral Daily  . methylPREDNISolone (SOLU-MEDROL) injection  60 mg Intravenous Q8H  . pantoprazole  40 mg Oral Daily  . triamcinolone cream  1 application Topical BID   . levofloxacin (LEVAQUIN) IV Stopped (08/15/17 2321)   acetaminophen **OR** acetaminophen, ipratropium-albuterol, lidocaine, ondansetron **OR** ondansetron (ZOFRAN) IV  Exam: Alert elderly AAM No distress No jvd Chest bilat exp wheezing, dec'd air movement Cor tachy, reg no mrg Abd protuberant, obese, nontender, no ascites or hsm Ext L great toe MCP joint red and tender, no LE edema Skin patchy maculopapular rash on upper torso, abdomen, chest    Brief Summary: 84 yo male hx CHF, HTN, COPD, CKD IV, steroid-induced DM2 , tobacco , gerd. Is on long term prednisone since 2017 for COPD.  Presented 2/26 with SOB^, thick greenish sputum, DOE, Shirl Harris use.  Not on home O2.  Also reported diffuse body rash and painful joint 1st toe on left foot.  IN ED RR 22, CXR negative, pt wheezing. rx'd with IV steroids, alb nebs, benadryl and admitted.    Home meds: -norvasc/  lasix 40 qd/ imdur 30 / ecasa 81 -albuterol nebs/ symbicort bid/ claritin/ pred 10 mg daily/ spiriva 18 ug qd -kenalog 0.1% cream -PPI -lantus 14 u hs      Problems: COPD exacerbation Acute / chronic diast CHF Acute gout L great toe DM2 CKD IV HTN Pruritic rash     Assessment/ Plan:  1) COPD exacerbation/ acute on chronic - feeling a bit better, still wheezing w sig DOE though -cont nebs, IV steroids, IV levaquin -CXR neg, +sputum change, on abx -will need most likely 1- 3 more days in hospital   2) Acute/ chronic diast CHF - CXR clear, abd swelling.  Got IV lasix x 2 days, takes 40 po daily at home. Will resume po lasix today, has some pedal edema and CKD4.   3) Gout L great toe - acute, new diagnosis, uric acid 11.  No nsaids w ckd 4.  Steroids for COPD flare here are helping, pain and swelling down.  Will get pred taper at dc which can be adjusted to rx gout flare as well.   4) Skin rash - looks like drug rash, improving prob due to steroids -cont kenalog -no new recent meds -asked pharm to review his meds   5) DM2 , insulin-dependent -cont lantus  and SSI ac tid  6) CKD IV - creatinine is at baseline, 2.4- 2.8  7) HTN - decent control, cont norvasc   Vinson Moselleob Denelle Capurro MD [facility] pgr [phone] 08/15/2017, 10:35 AM   Code Status: full  Family Communication: Discussed plan of care with patient Disposition Plan: Likely discharge home in 1- 3 days Consults called: None Admission status: Inpatient         Recent Labs  Lab 08/14/17 1717 08/15/17 0746  NA 143 138  K 4.0 4.0  CL 103 101  CO2 25 24  GLUCOSE 161* 148*  BUN 25* 28*  CREATININE 2.24* 2.41*  CALCIUM 9.4 9.4   Recent Labs  Lab 08/15/17 0746  AST 47*  ALT 49  ALKPHOS 65  BILITOT 0.6  PROT 7.6  ALBUMIN 3.8   Recent Labs  Lab 08/14/17 1717 08/15/17 0746  WBC 13.5* 12.1*  HGB 12.5* 12.6*  HCT 40.6 38.9*  MCV 90.6 87.8  PLT 301 250   Iron/TIBC/Ferritin/  %Sat No results found for: IRON, TIBC, FERRITIN, IRONPCTSAT

## 2017-08-16 NOTE — Evaluation (Signed)
Physical Therapy Evaluation Patient Details Name: Greg Jones MRN: [id number] DOB: 1928/05/25 Today's Date: 08/16/2017   History of Present Illness  84 yo male with hx of CHF, HTN, COPD, CKD IV, steroid-induced DM2, tobacco, gerd, and admitted for COPD exacerbation and CHF.  Clinical Impression  Pt admitted with above diagnosis. Pt currently with functional limitations due to the deficits listed below (see PT Problem List).  Pt will benefit from skilled PT to increase their independence and safety with mobility to allow discharge to the venue listed below.  Pt ambulated short distance in hallway and reported moderate dyspnea which limited distance.  Pt reports similar experiences with dyspnea and activity for a few months prior to admission.  Pt states he takes multiple rest breaks for most activities.  Pt plans to d/c back home, recommend HHPT.     Follow Up Recommendations Home health PT;Supervision - Intermittent    Equipment Recommendations  None recommended by PT    Recommendations for Other Services       Precautions / Restrictions Precautions Precaution Comments: monitor sats      Mobility  Bed Mobility Overal bed mobility: Needs Assistance Bed Mobility: Supine to Sit     Supine to sit: HOB elevated;Min guard     General bed mobility comments: provided a hand for pt to self assist trunk upright  Transfers Overall transfer level: Needs assistance Equipment used: Rolling walker (2 wheeled) Transfers: Sit to/from Stand Sit to Stand: Min assist         General transfer comment: assist for controlling descent, verbal cues for safe technique  Ambulation/Gait Ambulation/Gait assistance: Min guard Ambulation Distance (Feet): 70 Feet Assistive device: Rolling walker (2 wheeled) Gait Pattern/deviations: Step-through pattern;Decreased stride length     General Gait Details: steady with RW however fatigues quickly, reports moderate dyspnea, Spo2 however 89-94% on  room air, pt brought back to room in rolling chair due to wheezing and dyspnea  Stairs            Wheelchair Mobility    Modified Rankin (Stroke Patients Only)       Balance Overall balance assessment: Needs assistance         Standing balance support: No upper extremity supported Standing balance-Leahy Scale: Fair Standing balance comment: able to statically stand without support, relies on UE support for mobility likely for energy conservation, denies any falls                             Pertinent Vitals/Pain Pain Assessment: No/denies pain    Home Living Family/patient expects to be discharged to:: Private residence Living Arrangements: Other relatives(sister) Available Help at Discharge: Family Type of Home: House Home Access: Level entry     Home Layout: One level Home Equipment: Environmental consultant - 4 wheels      Prior Function Level of Independence: Independent with assistive device(s)               Hand Dominance        Extremity/Trunk Assessment        Lower Extremity Assessment Lower Extremity Assessment: Generalized weakness;LLE deficits/detail LLE Deficits / Details: slight L ankle edema       Communication   Communication: No difficulties  Cognition Arousal/Alertness: Awake/alert Behavior During Therapy: WFL for tasks assessed/performed Overall Cognitive Status: Within Functional Limits for tasks assessed  General Comments      Exercises     Assessment/Plan    PT Assessment Patient needs continued PT services  PT Problem List Decreased strength;Decreased mobility;Decreased activity tolerance;Decreased knowledge of use of DME;Cardiopulmonary status limiting activity       PT Treatment Interventions Gait training;Therapeutic exercise;Patient/family education;DME instruction;Therapeutic activities;Functional mobility training;Stair training    PT Goals (Current  goals can be found in the Care Plan section)  Acute Rehab PT Goals PT Goal Formulation: With patient Time For Goal Achievement: 08/23/17 Potential to Achieve Goals: Good    Frequency Min 3X/week   Barriers to discharge        Co-evaluation               AM-PAC PT "6 Clicks" Daily Activity  Outcome Measure Difficulty turning over in bed (including adjusting bedclothes, sheets and blankets)?: A Little Difficulty moving from lying on back to sitting on the side of the bed? : Unable Difficulty sitting down on and standing up from a chair with arms (e.g., wheelchair, bedside commode, etc,.)?: Unable Help needed moving to and from a bed to chair (including a wheelchair)?: A Little Help needed walking in hospital room?: A Little Help needed climbing 3-5 steps with a railing? : A Lot 6 Click Score: 13    End of Session Equipment Utilized During Treatment: Gait belt Activity Tolerance: Patient limited by fatigue Patient left: in chair;with call bell/phone within reach   PT Visit Diagnosis: Difficulty in walking, not elsewhere classified (R26.2)    Time: 0950-1008 PT Time Calculation (min) (ACUTE ONLY): 18 min   Charges:   PT Evaluation $PT Eval Low Complexity: 1 Low     PT G CodesZenovia Jarred:       Kati Chemeka Filice, PT, DPT 08/16/2017 Pager: [phone]  Maida SaleLEMYRE,KATHrine E 08/16/2017, 12:33 PM

## 2017-08-17 LAB — GLUCOSE, CAPILLARY
GLUCOSE-CAPILLARY: 184 mg/dL — AB (ref 65–99)
Glucose-Capillary: 181 mg/dL — ABNORMAL HIGH (ref 65–99)
Glucose-Capillary: 232 mg/dL — ABNORMAL HIGH (ref 65–99)
Glucose-Capillary: 216 mg/dL — ABNORMAL HIGH (ref 65–99)

## 2017-08-17 MED ORDER — LEVOFLOXACIN 500 MG PO TABS
250.0000 mg | ORAL_TABLET | ORAL | Status: DC
  Administered 2017-08-17 – 2017-08-18 (×2): 250 mg via ORAL
  Filled 2017-08-17 (×2): qty 1

## 2017-08-17 MED ORDER — IPRATROPIUM-ALBUTEROL 0.5-2.5 (3) MG/3ML IN SOLN
3.0000 mL | Freq: Three times a day (TID) | RESPIRATORY_TRACT | Status: DC
  Administered 2017-08-17 – 2017-08-22 (×14): 3 mL via RESPIRATORY_TRACT
  Filled 2017-08-17 (×15): qty 3

## 2017-08-17 MED ORDER — CARVEDILOL 3.125 MG PO TABS
3.1250 mg | ORAL_TABLET | Freq: Two times a day (BID) | ORAL | Status: DC
  Administered 2017-08-18 – 2017-08-22 (×9): 3.125 mg via ORAL
  Filled 2017-08-17 (×9): qty 1

## 2017-08-17 NOTE — Care Management Important Message (Signed)
Important Message  Patient Details  Name: Greg RobertsJonathan Depasquale MRN: [id number] Date of Birth: 08/16/1927   Medicare Important Message Given:  Yes    Caren MacadamFuller, Seniyah Esker 08/17/2017, 11:27 AMImportant Message  Patient Details  Name: Greg RobertsJonathan Ginsberg MRN: [id number] Date of Birth: 04/08/1928   Medicare Important Message Given:  Yes    Caren MacadamFuller, Myleigh Amara 08/17/2017, 11:27 AM

## 2017-08-17 NOTE — Progress Notes (Signed)
Physical Therapy Treatment Patient Details Name: Greg Jones MRN: [id number] DOB: 10-24-1927 Today's Date: 08/17/2017    History of Present Illness 84 yo male with hx of CHF, HTN, COPD, CKD IV, steroid-induced DM2, tobacco, gerd, and admitted for COPD exacerbation and CHF.    PT Comments    Pt reports feeling a little better today however only able to tolerate same distance of ambulation as yesterday.  SpO2 remains mostly in upper 90s during session on room air.  Follow Up Recommendations  Home health PT;Supervision - Intermittent     Equipment Recommendations  None recommended by PT    Recommendations for Other Services       Precautions / Restrictions Precautions Precaution Comments: monitor sats Restrictions Weight Bearing Restrictions: No    Mobility  Bed Mobility Overal bed mobility: Needs Assistance Bed Mobility: Supine to Sit     Supine to sit: HOB elevated;Supervision     General bed mobility comments: increased time and effort  Transfers Overall transfer level: Needs assistance Equipment used: Rolling walker (2 wheeled) Transfers: Sit to/from Stand Sit to Stand: Min guard         General transfer comment: verbal cues for safe technique  Ambulation/Gait Ambulation/Gait assistance: Min guard Ambulation Distance (Feet): 70 Feet Assistive device: Rolling walker (2 wheeled) Gait Pattern/deviations: Step-through pattern;Decreased stride length     General Gait Details: continues to remain steady with RW however fatigues quickly and reports moderate dyspnea, Spo2 however briefy reading 87% however mostly reading 98-100% during ambulation on room air, pt brought back to room in rolling chair due to dyspnea   Stairs            Wheelchair Mobility    Modified Rankin (Stroke Patients Only)       Balance                                            Cognition Arousal/Alertness: Awake/alert Behavior During Therapy: WFL for  tasks assessed/performed Overall Cognitive Status: Within Functional Limits for tasks assessed                                        Exercises      General Comments        Pertinent Vitals/Pain Pain Assessment: No/denies pain    Home Living                      Prior Function            PT Goals (current goals can now be found in the care plan section) Progress towards PT goals: Progressing toward goals    Frequency    Min 3X/week      PT Plan Current plan remains appropriate    Co-evaluation              AM-PAC PT "6 Clicks" Daily Activity  Outcome Measure  Difficulty turning over in bed (including adjusting bedclothes, sheets and blankets)?: A Little Difficulty moving from lying on back to sitting on the side of the bed? : A Lot Difficulty sitting down on and standing up from a chair with arms (e.g., wheelchair, bedside commode, etc,.)?: Unable Help needed moving to and from a bed to chair (including a wheelchair)?: A Little Help needed walking in  hospital room?: A Little Help needed climbing 3-5 steps with a railing? : A Lot 6 Click Score: 14    End of Session   Activity Tolerance: Patient limited by fatigue Patient left: in chair;with call bell/phone within reach Nurse Communication: Mobility status(RN aware O2 [place] left off, SPO2 100% end of session on room air) PT Visit Diagnosis: Difficulty in walking, not elsewhere classified (R26.2)     Time: [id number] PT Time Calculation (min) (ACUTE ONLY): 17 min  Charges:  $Gait Training: 8-22 mins                    G Codes:       Zenovia JarredKati Circe Chilton, PT, DPT 08/17/2017 Pager: [phone]  Maida SaleLEMYRE,KATHrine E 08/17/2017, 12:22 PM

## 2017-08-17 NOTE — Progress Notes (Signed)
PHARMACY NOTE:  ANTIMICROBIAL RENAL DOSAGE ADJUSTMENT  Current antimicrobial regimen includes a mismatch between antimicrobial dosage and estimated renal function.  As per policy approved by the Pharmacy & Therapeutics and Medical Executive Committees, the antimicrobial dosage will be adjusted accordingly.  Current antimicrobial dosage:  Levaquin 500mg  x1 then 250mg  q48h  Indication: COPD exacerbation  Renal Function:  Estimated Creatinine Clearance: 22.4 mL/min (A) (by C-G formula based on SCr of 2.41 mg/dL (H)). []      On intermittent HD, scheduled: []      On CRRT    Antimicrobial dosage has been changed to:  Levaquin 250mg  PO q24h x 4 doses for CrCl>20 Additional comments:  Thank you for allowing pharmacy to be a part of this patient's care.  Greg Jones, Greg Jones, [facility] HospitalRPH 08/17/2017 7:41 PM

## 2017-08-17 NOTE — Progress Notes (Signed)
PROGRESS NOTE  Greg RobertsJonathan Jones ZOX:[id number] DOB: 04/05/1928 DOA: 08/14/2017 PCP: [name], [name], MD  HPI/Recap of past 24 hours:  Continue to have intermittent cough, wheezing He reports weight gain and lower extremity edema  Assessment/Plan: Principal Problem:   COPD exacerbation (HCC) Active Problems:   GERD (gastroesophageal reflux disease)   Chronic combined systolic and diastolic CHF (congestive heart failure) (HCC)   CKD (chronic kidney disease), stage IV (HCC)   Leukocytosis   Pruritic rash   Pain and swelling of toe of left foot  Acute hypoxic respiratory failure Likely due to COPD exacerbation and acute on chronic combined CHF Slowly improving, on room air at rest, you have dyspnea of exertion and hypoxia upon ambulation  COPD exacerbation Chest x-ray no acute findings Continue wheeze, continue on steroid, nebs, antibiotic  Acute on chronic combined CHF -Echo lvef 44-45%, grade 1 diastolic dysfunction -Does have bilateral lower extremity pitting edema, L>R -Continue Lasix, get venous Doppler to rule out DVT -D/c norvasc due to edema, start low dose coreg, monitor effect, continue home meds imdur  Insulin-dependent type 2 diabetes Blood glucose on presentation was 530 She is currently on steroids for copd, adjust insulin accordingly  CKD 4 Renal function appears at baseline, no dosing meds   Gout of left great toe Symptoms improved with steroid, uric acid 11  Skin patchy maculopapular rash on upper torso Improving on steroid  Code Status: full  Family Communication: patient   Disposition Plan: Not ready for discharge   Consultants:  None  Procedures:  None  Antibiotics:  As described above   Objective: BP 126/84 (BP Location: Left Arm)   Pulse 78   Temp 97.8 F (36.6 C) (Oral)   Resp 20   Ht 5' 7.5" (1.715 m)   Wt 85.7 kg (188 lb 15 oz)   SpO2 95%   BMI 29.15 kg/m   Intake/Output Summary (Last 24 hours) at 08/17/2017  1923 Last data filed at 08/17/2017 1800 Gross per 24 hour  Intake 650 ml  Output 1325 ml  Net -675 ml   Filed Weights   08/15/17 1700 08/16/17 0629  Weight: 86 kg (189 lb 9.6 oz) 85.7 kg (188 lb 15 oz)    Exam: Patient is examined daily including today on 08/17/2017, exams remain the same as of yesterday except that has changed    General:  NAD  Cardiovascular: RRR  Respiratory: Bilateral wheezing  Abdomen: Soft/ND/NT, positive BS  Musculoskeletal: bilateral lower extremity pitting Edema, L>R  Neuro: alert, oriented   Data Reviewed: Basic Metabolic Panel: Recent Labs  Lab 08/14/17 1717 08/15/17 0746  NA 143 138  K 4.0 4.0  CL 103 101  CO2 25 24  GLUCOSE 161* 148*  BUN 25* 28*  CREATININE 2.24* 2.41*  CALCIUM 9.4 9.4   Liver Function Tests: Recent Labs  Lab 08/15/17 0746  AST 47*  ALT 49  ALKPHOS 65  BILITOT 0.6  PROT 7.6  ALBUMIN 3.8   No results for input(s): LIPASE, AMYLASE in the last 168 hours. No results for input(s): AMMONIA in the last 168 hours. CBC: Recent Labs  Lab 08/14/17 1717 08/15/17 0746  WBC 13.5* 12.1*  HGB 12.5* 12.6*  HCT 40.6 38.9*  MCV 90.6 87.8  PLT 301 250   Cardiac Enzymes:   Recent Labs  Lab 08/14/17 2207 08/15/17 0746 08/15/17 1045  TROPONINI 0.03* 0.03* 0.03*   BNP (last 3 results) Recent Labs    09/07/16 1831 05/30/17 1447 08/14/17 1717  BNP 82.2  66.8 96.7    ProBNP (last 3 results) No results for input(s): PROBNP in the last 8760 hours.  CBG: Recent Labs  Lab 08/16/17 1653 08/16/17 2103 08/17/17 0753 08/17/17 1130 08/17/17 1625  GLUCAP 149* 190* 181* 232* 184*    No results found for this or any previous visit (from the past 240 hour(s)).   Studies: No results found.  Scheduled Meds: . amLODipine  5 mg Oral Daily  . arformoterol  15 mcg Nebulization BID  . aspirin EC  81 mg Oral Daily  . budesonide (PULMICORT) nebulizer solution  0.5 mg Nebulization BID  . furosemide  40 mg Oral Daily    . guaiFENesin  600 mg Oral BID  . heparin  5,000 Units Subcutaneous Q8H  . insulin aspart  0-9 Units Subcutaneous TID WC  . insulin glargine  14 Units Subcutaneous QHS  . ipratropium-albuterol  3 mL Nebulization TID  . isosorbide mononitrate  30 mg Oral Daily  . loratadine  10 mg Oral Daily  . methylPREDNISolone (SOLU-MEDROL) injection  60 mg Intravenous Q8H  . pantoprazole  40 mg Oral Daily  . triamcinolone cream  1 application Topical BID    Continuous Infusions: . levofloxacin (LEVAQUIN) IV Stopped (08/16/17 2156)     Time spent: I have personally reviewed and interpreted on  08/17/2017 daily labs, tele strips, imagings as discussed above under date review session and assessment and plans.  I reviewed all nursing notes, pharmacy notes,  vitals, pertinent old records  I have discussed plan of care as described above with RN , patient  on 08/17/2017   Albertine Grates MD, PhD  [facility] Pager [phone]. If 7PM-7AM, please contact night-coverage at [email or web address], password [facility] 08/17/2017, 7:23 PM  LOS: 3 days

## 2017-08-18 ENCOUNTER — Inpatient Hospital Stay (HOSPITAL_COMMUNITY): Payer: Medicare Other

## 2017-08-18 DIAGNOSIS — R609 Edema, unspecified: Secondary | ICD-10-CM

## 2017-08-18 LAB — COMPREHENSIVE METABOLIC PANEL
ALT: 52 U/L (ref 17–63)
AST: 52 U/L — ABNORMAL HIGH (ref 15–41)
Albumin: 3.1 g/dL — ABNORMAL LOW (ref 3.5–5.0)
Alkaline Phosphatase: 56 U/L (ref 38–126)
Anion gap: 10 (ref 5–15)
BILIRUBIN TOTAL: 0.7 mg/dL (ref 0.3–1.2)
BUN: 51 mg/dL — ABNORMAL HIGH (ref 6–20)
CO2: 28 mmol/L (ref 22–32)
CREATININE: 2.28 mg/dL — AB (ref 0.61–1.24)
Calcium: 9.2 mg/dL (ref 8.9–10.3)
Chloride: 102 mmol/L (ref 101–111)
GFR, EST AFRICAN AMERICAN: 28 mL/min — AB (ref >60)
GFR, EST NON AFRICAN AMERICAN: 24 mL/min — AB (ref >60)
Glucose, Bld: 187 mg/dL — ABNORMAL HIGH (ref 65–99)
Potassium: 3.6 mmol/L (ref 3.5–5.1)
Sodium: 140 mmol/L (ref 135–145)
Total Protein: 6.2 g/dL — ABNORMAL LOW (ref 6.5–8.1)

## 2017-08-18 LAB — CBC WITH DIFFERENTIAL/PLATELET
Basophils Absolute: 0 10*3/uL (ref 0.0–0.1)
Basophils Relative: 0 %
Eosinophils Absolute: 0 10*3/uL (ref 0.0–0.7)
Eosinophils Relative: 0 %
HEMATOCRIT: 35.7 % — AB (ref 39.0–52.0)
Hemoglobin: 11.2 g/dL — ABNORMAL LOW (ref 13.0–17.0)
LYMPHS ABS: 0.8 10*3/uL (ref 0.7–4.0)
LYMPHS PCT: 6 %
MCH: 27.7 pg (ref 26.0–34.0)
MCHC: 31.4 g/dL (ref 30.0–36.0)
MCV: 88.4 fL (ref 78.0–100.0)
MONOS PCT: 11 %
Monocytes Absolute: 1.6 10*3/uL — ABNORMAL HIGH (ref 0.1–1.0)
NEUTROS PCT: 83 %
Neutro Abs: 11.4 10*3/uL — ABNORMAL HIGH (ref 1.7–7.7)
Platelets: 265 10*3/uL (ref 150–400)
RBC: 4.04 MIL/uL — AB (ref 4.22–5.81)
RDW: 17.1 % — ABNORMAL HIGH (ref 11.5–15.5)
WBC: 13.8 10*3/uL — AB (ref 4.0–10.5)

## 2017-08-18 LAB — GLUCOSE, CAPILLARY
Glucose-Capillary: 182 mg/dL — ABNORMAL HIGH (ref 65–99)
Glucose-Capillary: 200 mg/dL — ABNORMAL HIGH (ref 65–99)
Glucose-Capillary: 139 mg/dL — ABNORMAL HIGH (ref 65–99)

## 2017-08-18 LAB — HEMOGLOBIN A1C
HEMOGLOBIN A1C: 8.1 % — AB (ref 4.8–5.6)
MEAN PLASMA GLUCOSE: 185.77 mg/dL

## 2017-08-18 MED ORDER — FUROSEMIDE 10 MG/ML IJ SOLN
20.0000 mg | Freq: Two times a day (BID) | INTRAMUSCULAR | Status: DC
  Administered 2017-08-18 – 2017-08-20 (×4): 20 mg via INTRAVENOUS
  Filled 2017-08-18 (×4): qty 2

## 2017-08-18 NOTE — Progress Notes (Addendum)
PROGRESS NOTE  Greg Jones ZOX:[id number] DOB: Dec 28, 1927 DOA: 08/14/2017   PCP: Margot Ables, MD  HPI/Subjective: 84 y.o. male with medical history significant of HTN, CHF, COPD on chronic steroids since 2017, DM Type II diagnosed in 05/2017, CKD stage IV, history of tobacco use, and gerd; who presented with worsening shortness of breath over the last 3-4 days prior to this admission, associated with productive cough with thick yellowish-green sputum production.  Patient reported utilizing albuterol nebulizer 2-3 times per night which is unusual for him.  He is not on oxygen at baseline.  Associated symptoms include abdominal distention, weight gain of 8-10 lbs in 2 week, decrease urine output, mild lower extremity swelling, and a diffuse red itchy rash of his entire body that has presented itself in the last 2-3 weeks.  He was [facility] at the [facility] and given some ointment which helps some with his itching symptoms, but rash has not significantly clear.  The left toe has also turned red, but has not got into see the endocrinologist.  denies having any fever, chills, nausea, vomiting, diarrhea, or chest pain.    Assessment/Plan: Acute hypoxic respiratory failure - suspect due to acute on chronic combined CHF, with acute COPD exacerbation  - very slow to improve and also suspect hat abd distension is contributing to dyspnea  - PCCM consulted, appreciate assistance  - will also consider cardiology consultation   COPD exacerbation - no acute findings on CXR - pt still with intermittent dyspnea at rest and with exertion despite BD's and steroids - CT chest pending - PCCM consulted for assistance   Acute on chronic combined CHF, mld trop elevation, demand ischemia  - Echo lvef 44-45%, grade 1 diastolic dysfunction - has been on lasix 40 mg PO QD which is pt's home medical regimen  - trop 0.03, flat pattern, not consistent with ACS - this is the weight trend since  admission: Filed Weights   08/15/17 1700 08/16/17 0629 08/18/17 0406  Weight: 86 kg (189 lb 9.6 oz) 85.7 kg (188 lb 15 oz) 85.2 kg (187 lb 13.3 oz)  - continue Imdur and Coreg - also continue lasix but will change to IV BID regimen   Abd distension, transaminitis - [place] requested   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  - continue Insulin Glargine 14 U QHS - also continue SSI  CKD 4  - renal function overall stable - Cr trending down - will repeat BMP in AM  Gout of left great toe - improving while on steroids   Skin patchy maculopapular rash on upper torso  - overall improving   Code Status: Full  Family Communication: pt updated at bedside  Disposition Plan: Not ready for discharge  Consultants:  PCCM  Procedures:  None  Antibiotics:  Levaquin 02/26 --<  Objective: BP (!) 161/85 (BP Location: Right Arm)   Pulse 77   Temp 98.4 F (36.9 C) (Oral)   Resp 18   Ht 5' 7.5" (1.715 m)   Wt 85.2 kg (187 lb 13.3 oz)   SpO2 95%   BMI 28.98 kg/m   Intake/Output Summary (Last 24 hours) at 08/18/2017 1506 Last data filed at 08/18/2017 1445 Gross per 24 hour  Intake 240 ml  Output 1950 ml  Net -1710 ml   Filed Weights   08/15/17 1700 08/16/17 0629 08/18/17 0406  Weight: 86 kg (189 lb 9.6 oz) 85.7 kg (188 lb 15 oz) 85.2 kg (187 lb 13.3 oz)   Physical  Exam  Constitutional: Appears to be in mild distress due to dyspnea  CVS: RRR, S1/S2 +, no murmurs, no gallops, no carotid bruit.  Pulmonary: exp wheezing noted with diminished breath sounds at bases  Abdominal: Soft. BS +,  Distended, no tenderness, rebound or guarding.  Musculoskeletal: Normal range of motion. Bilateral LE edema LLE > RLE Neuro: Alert. Normal reflexes, muscle tone coordination. No cranial nerve deficit.  Data Reviewed: Basic Metabolic Panel: Recent Labs  Lab 08/14/17 1717 08/15/17 0746 08/18/17 0413  NA 143 138 140  K 4.0 4.0 3.6  CL 103 101 102  CO2 25 24 28    GLUCOSE 161* 148* 187*  BUN 25* 28* 51*  CREATININE 2.24* 2.41* 2.28*  CALCIUM 9.4 9.4 9.2   Liver Function Tests: Recent Labs  Lab 08/15/17 0746 08/18/17 0413  AST 47* 52*  ALT 49 52  ALKPHOS 65 56  BILITOT 0.6 0.7  PROT 7.6 6.2*  ALBUMIN 3.8 3.1*   CBC: Recent Labs  Lab 08/14/17 1717 08/15/17 0746 08/18/17 0413  WBC 13.5* 12.1* 13.8*  NEUTROABS  --   --  11.4*  HGB 12.5* 12.6* 11.2*  HCT 40.6 38.9* 35.7*  MCV 90.6 87.8 88.4  PLT 301 250 265   Cardiac Enzymes:   Recent Labs  Lab 08/14/17 2207 08/15/17 0746 08/15/17 1045  TROPONINI 0.03* 0.03* 0.03*   BNP (last 3 results) Recent Labs    09/07/16 1831 05/30/17 1447 08/14/17 1717  BNP 82.2 66.8 96.7   CBG: Recent Labs  Lab 08/17/17 1130 08/17/17 1625 08/17/17 2115 08/18/17 0803 08/18/17 1203  GLUCAP 232* 184* 216* 182* 200*   Studies: No results found.  Scheduled Meds: . arformoterol  15 mcg Nebulization BID  . aspirin EC  81 mg Oral Daily  . budesonide (PULMICORT) nebulizer solution  0.5 mg Nebulization BID  . carvedilol  3.125 mg Oral BID WC  . guaiFENesin  600 mg Oral BID  . heparin  5,000 Units Subcutaneous Q8H  . insulin aspart  0-9 Units Subcutaneous TID WC  . insulin glargine  14 Units Subcutaneous QHS  . ipratropium-albuterol  3 mL Nebulization TID  . isosorbide mononitrate  30 mg Oral Daily  . levofloxacin  250 mg Oral Q24H  . loratadine  10 mg Oral Daily  . methylPREDNISolone (SOLU-MEDROL) injection  60 mg Intravenous Q8H  . pantoprazole  40 mg Oral Daily  . triamcinolone cream  1 application Topical BID   Time spent:  35 minutes with > 50% of time discussing current diagnostic test results, clinical impression and plan of care.  Debbora PrestoIskra Magick-Wasim Hurlbut MD [facility] Pager [phone].  If 7PM-7AM, please contact night-coverage at [email or web address], password [email or web address]  08/18/2017, 3:06 PM  LOS: 4 days

## 2017-08-18 NOTE — Plan of Care (Signed)
VSS, patient has continued to have shortness of breath throughout shift with any movement, has frequent cough with a small amount of green sputum production.  CT chest performed this shift, abdominal ultrasound will not be able to be performed until the a.m. Patient continues to have wheezing throughout.  Awaiting pulmonary consult.

## 2017-08-18 NOTE — Progress Notes (Signed)
Pt. C/o shortness of breath, 98-100% on 2 liters oxygen.  Patient with wheezing throughout.  Duoneb administered as per orders.  Notified MD of above, new orders received.  Patient felt a slight bit better after receiving Duoneb.

## 2017-08-18 NOTE — Progress Notes (Signed)
Preliminary results by tech - Venous Duplex Lower Ext. Completed. Negative for deep vein thrombosis in both legs. Pascal Stiggers, BS, RDMS, RVT    

## 2017-08-19 ENCOUNTER — Inpatient Hospital Stay (HOSPITAL_COMMUNITY): Payer: Medicare Other

## 2017-08-19 LAB — COMPREHENSIVE METABOLIC PANEL
ALBUMIN: 3.5 g/dL (ref 3.5–5.0)
ALT: 103 U/L — ABNORMAL HIGH (ref 17–63)
ANION GAP: 15 (ref 5–15)
AST: 85 U/L — ABNORMAL HIGH (ref 15–41)
Alkaline Phosphatase: 57 U/L (ref 38–126)
BUN: 50 mg/dL — ABNORMAL HIGH (ref 6–20)
CO2: 26 mmol/L (ref 22–32)
Calcium: 9.4 mg/dL (ref 8.9–10.3)
Chloride: 96 mmol/L — ABNORMAL LOW (ref 101–111)
Creatinine, Ser: 2.2 mg/dL — ABNORMAL HIGH (ref 0.61–1.24)
GFR calc Af Amer: 29 mL/min — ABNORMAL LOW (ref >60)
GFR calc non Af Amer: 25 mL/min — ABNORMAL LOW (ref >60)
GLUCOSE: 135 mg/dL — AB (ref 65–99)
POTASSIUM: 4.1 mmol/L (ref 3.5–5.1)
SODIUM: 137 mmol/L (ref 135–145)
Total Bilirubin: 0.7 mg/dL (ref 0.3–1.2)
Total Protein: 6.8 g/dL (ref 6.5–8.1)

## 2017-08-19 LAB — CBC
HCT: 40.4 % (ref 39.0–52.0)
Hemoglobin: 12.7 g/dL — ABNORMAL LOW (ref 13.0–17.0)
MCH: 27.7 pg (ref 26.0–34.0)
MCHC: 31.4 g/dL (ref 30.0–36.0)
MCV: 88.2 fL (ref 78.0–100.0)
Platelets: 315 10*3/uL (ref 150–400)
RBC: 4.58 MIL/uL (ref 4.22–5.81)
RDW: 16.8 % — AB (ref 11.5–15.5)
WBC: 15.2 10*3/uL — ABNORMAL HIGH (ref 4.0–10.5)

## 2017-08-19 LAB — MAGNESIUM: Magnesium: 2.4 mg/dL (ref 1.7–2.4)

## 2017-08-19 LAB — GLUCOSE, CAPILLARY
GLUCOSE-CAPILLARY: 200 mg/dL — AB (ref 65–99)
GLUCOSE-CAPILLARY: 226 mg/dL — AB (ref 65–99)
Glucose-Capillary: 133 mg/dL — ABNORMAL HIGH (ref 65–99)
Glucose-Capillary: 251 mg/dL — ABNORMAL HIGH (ref 65–99)

## 2017-08-19 MED ORDER — SODIUM CHLORIDE 0.9 % IV SOLN
1.0000 g | Freq: Two times a day (BID) | INTRAVENOUS | Status: DC
  Administered 2017-08-19 – 2017-08-21 (×5): 1 g via INTRAVENOUS
  Filled 2017-08-19 (×6): qty 1

## 2017-08-19 NOTE — Progress Notes (Signed)
D/w Dr Izola PriceMyers - hold off pccm consult. Might be turning the corner for better  Dr. Kalman ShanMurali Queenie Aufiero, M.D., [facility] HospitalF.C.C.P Pulmonary and Critical Care Medicine Staff Physician, [facility] Center Director - Interstitial Lung Disease  Program  Pulmonary Fibrosis [facility] [place], [place], [id number]  Pager: [phone], If no answer or between  15:00h - 7:00h: call [phone] Telephone: [phone]

## 2017-08-19 NOTE — Progress Notes (Signed)
Pharmacy Antibiotic Note  Greg RobertsJonathan Jones is a 84 y.o. male admitted on 08/14/2017 with COPD exacerbation, started on Levaquin.  Pharmacy has been consulted for Meropenem dosing for aspiration pneumonia given PCN allergy with no documentation of PCN or cephalosporin use.  Today, 08/19/2017 Day #5 antibiotics Afebrile WBC up 15.2 (on steroids) SCr 2.2, CrCl ~22 ml/min  Plan: Unasyn 3g IV q12h Follow renal function and adjust as needed  Height: 5' 7.5" (171.5 cm) Weight: 188 lb 15 oz (85.7 kg) IBW/kg (Calculated) : 67.25  Temp (24hrs), Avg:98.1 F (36.7 C), Min:97.9 F (36.6 C), Max:98.4 F (36.9 C)  Recent Labs  Lab 08/14/17 1717 08/15/17 0746 08/18/17 0413 08/19/17 0502  WBC 13.5* 12.1* 13.8* 15.2*  CREATININE 2.24* 2.41* 2.28* 2.20*    Estimated Creatinine Clearance: 24.5 mL/min (A) (by C-G formula based on SCr of 2.2 mg/dL (H)).    Allergies  Allergen Reactions  . Other Other (See Comments)    Oxygen Sensitive  . Penicillins Rash and Other (See Comments)    Any -illins as well as Mycins Has patient had a PCN reaction causing immediate rash, facial/tongue/throat swelling, SOB or lightheadedness with hypotension: Yes Has patient had a PCN reaction causing severe rash involving mucus membranes or skin necrosis: Yes Has patient had a PCN reaction that required hospitalization No Has patient had a PCN reaction occurring within the last 10 years: No If all of the above answers are "NO", then may proceed with Cephalosporin use.   . Vitamin B12 Itching, Rash and Other (See Comments)    injections    Antimicrobials this admission:  2/27 Levaquin >> 3/3 3/3 Meropenem >>  Dose adjustments this admission:    Microbiology results:  none  Thank you for allowing pharmacy to be a part of this patient's care.  Loralee PacasErin Jillianne Gamino, PharmD, BCPS Pager: [phone] 08/19/2017 10:26 AM

## 2017-08-19 NOTE — Progress Notes (Addendum)
PROGRESS NOTE  Greg Jones WUJ:[id number] DOB: 1928/05/15 DOA: 08/14/2017   PCP: Margot Ables, MD  HPI/Subjective: 84 y.o. male with medical history significant of HTN, CHF, COPD on chronic steroids since 2017, DM Type II diagnosed in 05/2017, CKD stage IV, history of tobacco use, and gerd; who presented with worsening shortness of breath over the last 3-4 days prior to this admission, associated with productive cough with thick yellowish-green sputum production.  Patient reported utilizing albuterol nebulizer 2-3 times per night which is unusual for him.  He is not on oxygen at baseline.  Associated symptoms include abdominal distention, weight gain of 8-10 lbs in 2 week, decrease urine output, mild lower extremity swelling, and a diffuse red itchy rash of his entire body that has presented itself in the last 2-3 weeks.  He was seen at the [facility] and given some ointment which helps some with his itching symptoms, but rash has not significantly clear.  The left toe has also turned red, but has not got into see the endocrinologist.   No chest pain this AM, concerned with ongoing intermittent dyspnea at rest and with exertion.   Assessment/Plan: Acute hypoxic respiratory failure - suspect due to acute on chronic combined CHF, with acute COPD exacerbation, aspiration PNA - somewhat better this AM but still with diffuse wheezing bilaterally - CT chest with ? Aspiration PNA - due to QT prolongation, will stop Levaquin and will place pt on Merrem for now - if not better by am, will need to call PCCM   Acute COPD exacerbation - no acute findings on CXR - still requiring IV steroids and bronchodilators scheduled and as needed  - CT chest concerning for aspiration PNA, ABX changed to Merrem   Acute on chronic combined CHF, mld trop elevation, demand ischemia  - Echo lvef 44-45%, grade 1 diastolic dysfunction - has been on lasix 40 mg PO QD which is pt's home medical regimen  -  trop 0.03, flat pattern, not consistent with ACS - this is the weight trend since admission: Filed Weights   08/16/17 0629 08/18/17 0406 08/19/17 0459  Weight: 85.7 kg (188 lb 15 oz) 85.2 kg (187 lb 13.3 oz) 85.7 kg (188 lb 15 oz)  - continue Imdur and Coreg - continue lasix IV BID, currently on 20 mg and unable to increase further due to known hx of prolonged QT interval   Abd distension, transaminitis - [place] with no acute abnormalities to explain distension - pt not tender on exam, not clear why LTF's trending up - can be periodically assess based on clinical status   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  - continue Insulin Glargine 14 U QHS - cont SSI  CKD 4  - renal function overall stable - BMP In AM  Gout of left great toe - improving while on steroids   Skin patchy maculopapular rash on upper torso  - overall improving   Code Status: Full  DVT prophylaxis: Heparin SQ Family Communication: pt updated at bedside  Disposition Plan: not ready for d/c at this time  Consultants:  PCCM over the phone   Procedures:  None  Antibiotics:  Levaquin 02/26 -> 03/03  Merrem 03/03 -->  Objective: BP 129/78 (BP Location: Right Arm)   Pulse 69   Temp 98.1 F (36.7 C) (Oral)   Resp 20   Ht 5' 7.5" (1.715 m)   Wt 85.7 kg (188 lb 15 oz)   SpO2 98%   BMI 29.15  kg/m   Intake/Output Summary (Last 24 hours) at 08/19/2017 1444 Last data filed at 08/19/2017 1300 Gross per 24 hour  Intake 480 ml  Output 500 ml  Net -20 ml   Filed Weights   08/16/17 0629 08/18/17 0406 08/19/17 0459  Weight: 85.7 kg (188 lb 15 oz) 85.2 kg (187 lb 13.3 oz) 85.7 kg (188 lb 15 oz)   Physical Exam  Constitutional: Appears well-developed and well-nourished. No distress.  CVS: RRR, S1/S2 +, no murmurs, no gallops, no carotid bruit.  Pulmonary: diminished breath sounds bilaterally with exp wheezing  Abdominal: Soft. BS +,  Distended but not tender  Musculoskeletal:  Normal range of motion, Bilateral LE edema noted  Neuro: Alert. Normal reflexes, muscle tone coordination. No cranial nerve deficit.  Data Reviewed: Basic Metabolic Panel: Recent Labs  Lab 08/14/17 1717 08/15/17 0746 08/18/17 0413 08/19/17 0502  NA 143 138 140 137  K 4.0 4.0 3.6 4.1  CL 103 101 102 96*  CO2 25 24 28 26   GLUCOSE 161* 148* 187* 135*  BUN 25* 28* 51* 50*  CREATININE 2.24* 2.41* 2.28* 2.20*  CALCIUM 9.4 9.4 9.2 9.4  MG  --   --   --  2.4   Liver Function Tests: Recent Labs  Lab 08/15/17 0746 08/18/17 0413 08/19/17 0502  AST 47* 52* 85*  ALT 49 52 103*  ALKPHOS 65 56 57  BILITOT 0.6 0.7 0.7  PROT 7.6 6.2* 6.8  ALBUMIN 3.8 3.1* 3.5   CBC: Recent Labs  Lab 08/14/17 1717 08/15/17 0746 08/18/17 0413 08/19/17 0502  WBC 13.5* 12.1* 13.8* 15.2*  NEUTROABS  --   --  11.4*  --   HGB 12.5* 12.6* 11.2* 12.7*  HCT 40.6 38.9* 35.7* 40.4  MCV 90.6 87.8 88.4 88.2  PLT 301 250 265 315   Cardiac Enzymes:   Recent Labs  Lab 08/14/17 2207 08/15/17 0746 08/15/17 1045  TROPONINI 0.03* 0.03* 0.03*   BNP (last 3 results) Recent Labs    09/07/16 1831 05/30/17 1447 08/14/17 1717  BNP 82.2 66.8 96.7   CBG: Recent Labs  Lab 08/18/17 0803 08/18/17 1203 08/18/17 2113 08/19/17 0829 08/19/17 1147  GLUCAP 182* 200* 139* 133* 200*   Studies: Ct Chest Wo Contrast  Result Date: 08/18/2017 CLINICAL DATA:  Acute respiratory failure. EXAM: CT CHEST WITHOUT CONTRAST TECHNIQUE: Multidetector CT imaging of the chest was performed following the standard protocol without IV contrast. COMPARISON:  Chest radiograph 08/14/2017. FINDINGS: Cardiovascular: Normal heart size. No pericardial effusion. Coronary arterial vascular calcifications. Thoracic aortic vascular calcifications. Possible small outpouching of the descending thoracic aorta measuring 8 mm versus periaortic lymph node (image 98; series 2). Mediastinum/Nodes: No enlarged axillary, mediastinal or hilar  lymphadenopathy. Normal esophagus. Fluid within the esophagus. Lungs/Pleura: Central airways are patent. Centrilobular and paraseptal emphysematous changes. 3 mm subpleural left lower lobe nodule (image 86; series 7). Apical bullous change. Subpleural ground-glass and consolidative opacities within the right lower lobe. Possible irregular nodule within the right lower lobe measuring 8 mm (image 104; series 7). No pleural effusion or pneumothorax. In a bronchial material within the right lower lobe bronchi. Upper Abdomen: Liver is normal in size and contour. Gallbladder is unremarkable. Fatty atrophy of the pancreas. Spleen is unremarkable. Adrenal glands are normal in appearance. Infrarenal abdominal aortic aneurysm measuring 3.1 cm. Musculoskeletal: Thoracic spine degenerative changes. No aggressive or acute appearing osseous lesions. IMPRESSION: 1. Endobronchial material within the right lower lobe bronchi suggestive of aspiration. Subpleural ground-glass and consolidative opacities within the  right lower lobe may represent aspiration pneumonitis or pneumonia. 2. Possible small outpouching of the descending thoracic aorta. This is incompletely evaluated without intravenous contrast material. Consider further evaluation with contrast-enhanced CT, potentially at the time of follow-up exam. 3. Suggestion of possible small irregular nodule within the right lower lobe versus infectious process. Recommend follow-up chest CT in 6-8 weeks to ensure resolution. 4. 3 mm subpleural left lower lobe nodule. Recommend attention on follow-up chest CT. Aortic Atherosclerosis (ICD10-I70.0) and Emphysema (ICD10-J43.9). Electronically Signed   By: Annia Beltrew  Davis M.D.   On: 08/18/2017 18:47   [place] Abdomen Limited Ruq  Result Date: 08/19/2017 CLINICAL DATA:  Abdominal distention, elevated LFTs EXAM: ULTRASOUND ABDOMEN LIMITED RIGHT UPPER QUADRANT COMPARISON:  CT abdomen/pelvis dated 02/07/2016 FINDINGS: Gallbladder: No gallstones,  gallbladder wall thickening, or pericholecystic fluid. Mild cholesterolosis. Common bile duct: Diameter: 4 mm Liver: Hyperechoic hepatic parenchyma, raising the possibility of hepatic steatosis. No focal hepatic lesion is seen. Portal vein is patent on color Doppler imaging with normal direction of blood flow towards the liver. IMPRESSION: Possible hepatic steatosis. Electronically Signed   By: Charline BillsSriyesh  Krishnan M.D.   On: 08/19/2017 08:30    Scheduled Meds: . arformoterol  15 mcg Nebulization BID  . aspirin EC  81 mg Oral Daily  . budesonide (PULMICORT) nebulizer solution  0.5 mg Nebulization BID  . carvedilol  3.125 mg Oral BID WC  . furosemide  20 mg Intravenous BID  . guaiFENesin  600 mg Oral BID  . heparin  5,000 Units Subcutaneous Q8H  . insulin aspart  0-9 Units Subcutaneous TID WC  . insulin glargine  14 Units Subcutaneous QHS  . ipratropium-albuterol  3 mL Nebulization TID  . isosorbide mononitrate  30 mg Oral Daily  . loratadine  10 mg Oral Daily  . methylPREDNISolone (SOLU-MEDROL) injection  60 mg Intravenous Q8H  . pantoprazole  40 mg Oral Daily  . triamcinolone cream  1 application Topical BID   Time spent:  35 minutes with > 50% of time discussing current diagnostic test results, clinical impression and plan of care.  Debbora PrestoIskra Magick-Rishik Tubby MD [facility] Pager [phone].  If 7PM-7AM, please contact night-coverage at [email or web address], password [email or web address]  08/19/2017, 2:44 PM  LOS: 5 days

## 2017-08-19 NOTE — Plan of Care (Signed)
Patient stable during 7 a to 7 p shift, no episodes of dyspnea reported by patient this shift.  Lungs overall much clearer today, some wheezing noted in afternoon.  Patient reports he feels much improved today.  Sister at bedside for part of shift.

## 2017-08-20 DIAGNOSIS — J690 Pneumonitis due to inhalation of food and vomit: Secondary | ICD-10-CM

## 2017-08-20 LAB — BASIC METABOLIC PANEL
Anion gap: 10 (ref 5–15)
BUN: 50 mg/dL — ABNORMAL HIGH (ref 6–20)
CHLORIDE: 98 mmol/L — AB (ref 101–111)
CO2: 28 mmol/L (ref 22–32)
Calcium: 8.8 mg/dL — ABNORMAL LOW (ref 8.9–10.3)
Creatinine, Ser: 1.97 mg/dL — ABNORMAL HIGH (ref 0.61–1.24)
GFR calc non Af Amer: 29 mL/min — ABNORMAL LOW (ref >60)
GFR, EST AFRICAN AMERICAN: 33 mL/min — AB (ref >60)
Glucose, Bld: 195 mg/dL — ABNORMAL HIGH (ref 65–99)
POTASSIUM: 4.6 mmol/L (ref 3.5–5.1)
SODIUM: 136 mmol/L (ref 135–145)

## 2017-08-20 LAB — GLUCOSE, CAPILLARY
GLUCOSE-CAPILLARY: 156 mg/dL — AB (ref 65–99)
GLUCOSE-CAPILLARY: 194 mg/dL — AB (ref 65–99)
GLUCOSE-CAPILLARY: 259 mg/dL — AB (ref 65–99)
Glucose-Capillary: 199 mg/dL — ABNORMAL HIGH (ref 65–99)
Glucose-Capillary: 250 mg/dL — ABNORMAL HIGH (ref 65–99)

## 2017-08-20 LAB — CBC
HEMATOCRIT: 37.7 % — AB (ref 39.0–52.0)
Hemoglobin: 12.2 g/dL — ABNORMAL LOW (ref 13.0–17.0)
MCH: 28.3 pg (ref 26.0–34.0)
MCHC: 32.4 g/dL (ref 30.0–36.0)
MCV: 87.5 fL (ref 78.0–100.0)
Platelets: 272 10*3/uL (ref 150–400)
RBC: 4.31 MIL/uL (ref 4.22–5.81)
RDW: 16.6 % — ABNORMAL HIGH (ref 11.5–15.5)
WBC: 15 10*3/uL — ABNORMAL HIGH (ref 4.0–10.5)

## 2017-08-20 MED ORDER — FUROSEMIDE 10 MG/ML IJ SOLN
20.0000 mg | Freq: Two times a day (BID) | INTRAMUSCULAR | Status: DC
  Administered 2017-08-20 – 2017-08-21 (×2): 20 mg via INTRAVENOUS
  Filled 2017-08-20 (×2): qty 2

## 2017-08-20 MED ORDER — METHYLPREDNISOLONE SODIUM SUCC 40 MG IJ SOLR
40.0000 mg | Freq: Three times a day (TID) | INTRAMUSCULAR | Status: DC
  Administered 2017-08-20 – 2017-08-21 (×3): 40 mg via INTRAVENOUS
  Filled 2017-08-20 (×3): qty 1

## 2017-08-20 NOTE — Progress Notes (Signed)
Physical Therapy Treatment Patient Details Name: Greg RobertsJonathan Jones MRN: [id number] DOB: 04/08/1928 Today's Date: 08/20/2017    History of Present Illness 84 yo male with hx of CHF, HTN, COPD, CKD IV, steroid-induced DM2, tobacco, gerd, and admitted for COPD exacerbation and CHF.    PT Comments    Pt reports feeling better today.  Pt with improved ability to tolerate ambulation reporting only mild dyspnea (vs moderate from last session).  Follow Up Recommendations  Home health PT;Supervision - Intermittent     Equipment Recommendations  None recommended by PT    Recommendations for Other Services       Precautions / Restrictions Precautions Precaution Comments: monitor sats Restrictions Weight Bearing Restrictions: No    Mobility  Bed Mobility Overal bed mobility: Needs Assistance Bed Mobility: Supine to Sit     Supine to sit: HOB elevated;Supervision     General bed mobility comments: increased time and effort  Transfers Overall transfer level: Needs assistance Equipment used: Rolling walker (2 wheeled) Transfers: Sit to/from Stand Sit to Stand: Min guard         General transfer comment: verbal cues for safe technique  Ambulation/Gait Ambulation/Gait assistance: Min guard Ambulation Distance (Feet): 70 Feet(x2) Assistive device: Rolling walker (2 wheeled) Gait Pattern/deviations: Step-through pattern;Decreased stride length     General Gait Details: reports mild dyspnea today, Spo2 98 room air during ambulation, pt able to ambulate back to room today after short seated rest break   Stairs            Wheelchair Mobility    Modified Rankin (Stroke Patients Only)       Balance                                            Cognition Arousal/Alertness: Awake/alert Behavior During Therapy: WFL for tasks assessed/performed Overall Cognitive Status: Within Functional Limits for tasks assessed                                        Exercises      General Comments        Pertinent Vitals/Pain Pain Assessment: No/denies pain    Home Living                      Prior Function            PT Goals (current goals can now be found in the care plan section) Progress towards PT goals: Progressing toward goals    Frequency    Min 3X/week      PT Plan Current plan remains appropriate    Co-evaluation              AM-PAC PT "6 Clicks" Daily Activity  Outcome Measure  Difficulty turning over in bed (including adjusting bedclothes, sheets and blankets)?: A Little Difficulty moving from lying on back to sitting on the side of the bed? : A Lot Difficulty sitting down on and standing up from a chair with arms (e.g., wheelchair, bedside commode, etc,.)?: Unable Help needed moving to and from a bed to chair (including a wheelchair)?: A Little Help needed walking in hospital room?: A Little Help needed climbing 3-5 steps with a railing? : A Little 6 Click Score: 15    End of  Session   Activity Tolerance: Patient limited by fatigue Patient left: in chair;with call bell/phone within reach Nurse Communication: Mobility status PT Visit Diagnosis: Difficulty in walking, not elsewhere classified (R26.2)     Time: 1610-9604 PT Time Calculation (min) (ACUTE ONLY): 11 min  Charges:  $Gait Training: 8-22 mins                    G Codes:       Zenovia Jarred, PT, DPT 08/20/2017 Pager: [phone]  Maida Sale E 08/20/2017, 1:45 PM

## 2017-08-20 NOTE — Progress Notes (Signed)
[facility] PROGRESS NOTE  Greg RobertsJonathan Ausburn ZOX:[id number] DOB: 04/05/1928 DOA: 08/14/2017 PCP: [name], [name], MD  Brief summary   84 y.o.malewith medical history significant ofHTN,CHF, COPDon chronic steroids since 2017,DM Type II diagnosed in 05/2017,CKD stageIV,history of tobacco use, andgerd; who presented with worsening shortness of breath over the last 3-4 days prior to this admission, associated with productive cough with thick yellowish-green sputum production. Patient reported utilizing albuterol nebulizer 2-3 times per night which is unusual for him. He is not on oxygen at baseline. Associated symptoms include abdominal distention, weight gain of 8-10 lbs in 2 week,decrease urine output,mild lower extremity swelling,andadiffuse red itchy rash of hisentire body that has presented itself in the last 2-3 weeks. [facility] and given some ointment which helps some with his itchingsymptoms,but rash has not significantlyclear.The left toe has also turned red, but has not got into see doctor    Assessment/Plan:  Aspiration pneumonia. CT chest: Endobronchial material within the right lower lobe bronchi suggestive of aspiration. Subpleural ground-glass and consolidative opacities within the right lower lobe may represent aspiration pneumonitis or pneumonia. -cont iv antibiotic treatment, afebrile. Improving,. Obtain speech eval   Acute COPD exacerbation. Copd on chronic steroid treatment at home.  improving with  IV steroids and bronchodilators scheduled and as needed. Will taper to oral regimen soon.   Acute on chronic combined CHF, mld trop elevation, demand ischemia. Echo lvef 44-45%, grade 1 diastolic dysfunction. has been on lasix 40 mg PO QD which is pt's home medical regimen.  trop 0.03, flat pattern, not consistent with ACS -improving with diuresis with iv lasix. Monitor I/o, daily weight. Labs. continue Imdur and Coreg.    Acute  hypoxic respiratory failure. underlying severe copd/emphysema. suspect due to acute on chronic combined CHF, with acute COPD exacerbation, aspiration PNA. CT chest with ? Aspiration PNA -slowly improving as above. Will obtain desat study prior to discharge.   Abd distension, transaminitis. US with no acute abnormalities to explain distension. pt not tender on exam. 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 Monitor on Insulin Glargine 14 U QHS. SSI  CKD 4. renal function overall stable. Monitor   Gout of left great toe improving while on steroids   Skin patchy maculopapular rash on upper torso. overall improving     Code Status: full Family Communication: d/w patient, RN (indicate person spoken with, relationship, and if by phone, the number) Disposition Plan: pend clinical improvement. Obtain pt   Consultants:  PCCM over the phone   Procedures:  None  Antibiotics:  Levaquin 02/26 -> 03/03  Merrem 03/03 -->   HPI/Subjective: Alert. Reports feeling better, still cough. Afebrile   Objective: Vitals:   08/20/17 0758 08/20/17 0802  BP:    Pulse:    Resp:    Temp:    SpO2: 93% 93%    Intake/Output Summary (Last 24 hours) at 08/20/2017 0917 Last data filed at 08/20/2017 0600 Gross per 24 hour  Intake 680 ml  Output 1400 ml  Net -720 ml   Filed Weights   08/18/17 0406 08/19/17 0459 08/20/17 0440  Weight: 85.2 kg (187 lb 13.3 oz) 85.7 kg (188 lb 15 oz) 85.4 kg (188 lb 4.4 oz)    Exam:   General:  No distress   Cardiovascular: s1,s2 rrr  Respiratory: few crackles LL  Abdomen: soft, nt, nd   Musculoskeletal: mild pedal edema    Data Reviewed: Basic Metabolic Panel: Recent Labs  Lab 08/14/17 1717  08/15/17 0746 08/18/17 0413 08/19/17 0502 08/20/17 0454  NA 143 138 140 137 136  K 4.0 4.0 3.6 4.1 4.6  CL 103 101 102 96* 98*  CO2 25 24 28 26 28   GLUCOSE 161* 148* 187* 135* 195*  BUN 25* 28* 51* 50* 50*   CREATININE 2.24* 2.41* 2.28* 2.20* 1.97*  CALCIUM 9.4 9.4 9.2 9.4 8.8*  MG  --   --   --  2.4  --    Liver Function Tests: Recent Labs  Lab 08/15/17 0746 08/18/17 0413 08/19/17 0502  AST 47* 52* 85*  ALT 49 52 103*  ALKPHOS 65 56 57  BILITOT 0.6 0.7 0.7  PROT 7.6 6.2* 6.8  ALBUMIN 3.8 3.1* 3.5   No results for input(s): LIPASE, AMYLASE in the last 168 hours. No results for input(s): AMMONIA in the last 168 hours. CBC: Recent Labs  Lab 08/14/17 1717 08/15/17 0746 08/18/17 0413 08/19/17 0502 08/20/17 0454  WBC 13.5* 12.1* 13.8* 15.2* 15.0*  NEUTROABS  --   --  11.4*  --   --   HGB 12.5* 12.6* 11.2* 12.7* 12.2*  HCT 40.6 38.9* 35.7* 40.4 37.7*  MCV 90.6 87.8 88.4 88.2 87.5  PLT 301 250 265 315 272   Cardiac Enzymes: Recent Labs  Lab 08/14/17 2207 08/15/17 0746 08/15/17 1045  TROPONINI 0.03* 0.03* 0.03*   BNP (last 3 results) Recent Labs    09/07/16 1831 05/30/17 1447 08/14/17 1717  BNP 82.2 66.8 96.7    ProBNP (last 3 results) No results for input(s): PROBNP in the last 8760 hours.  CBG: Recent Labs  Lab 08/19/17 0829 08/19/17 1147 08/19/17 1754 08/19/17 2040 08/20/17 0728  GLUCAP 133* 200* 226* 251* 194*    No results found for this or any previous visit (from the past 240 hour(s)).   Studies: Ct Chest Wo Contrast  Result Date: 08/18/2017 CLINICAL DATA:  Acute respiratory failure. EXAM: CT CHEST WITHOUT CONTRAST TECHNIQUE: Multidetector CT imaging of the chest was performed following the standard protocol without IV contrast. COMPARISON:  Chest radiograph 08/14/2017. FINDINGS: Cardiovascular: Normal heart size. No pericardial effusion. Coronary arterial vascular calcifications. Thoracic aortic vascular calcifications. Possible small outpouching of the descending thoracic aorta measuring 8 mm versus periaortic lymph node (image 98; series 2). Mediastinum/Nodes: No enlarged axillary, mediastinal or hilar lymphadenopathy. Normal esophagus. Fluid  within the esophagus. Lungs/Pleura: Central airways are patent. Centrilobular and paraseptal emphysematous changes. 3 mm subpleural left lower lobe nodule (image 86; series 7). Apical bullous change. Subpleural ground-glass and consolidative opacities within the right lower lobe. Possible irregular nodule within the right lower lobe measuring 8 mm (image 104; series 7). No pleural effusion or pneumothorax. In a bronchial material within the right lower lobe bronchi. Upper Abdomen: Liver is normal in size and contour. Gallbladder is unremarkable. Fatty atrophy of the pancreas. Spleen is unremarkable. Adrenal glands are normal in appearance. Infrarenal abdominal aortic aneurysm measuring 3.1 cm. Musculoskeletal: Thoracic spine degenerative changes. No aggressive or acute appearing osseous lesions. IMPRESSION: 1. Endobronchial material within the right lower lobe bronchi suggestive of aspiration. Subpleural ground-glass and consolidative opacities within the right lower lobe may represent aspiration pneumonitis or pneumonia. 2. Possible small outpouching of the descending thoracic aorta. This is incompletely evaluated without intravenous contrast material. Consider further evaluation with contrast-enhanced CT, potentially at the time of follow-up exam. 3. Suggestion of possible small irregular nodule within the right lower lobe versus infectious process. Recommend follow-up chest CT in 6-8 weeks to ensure resolution. 4. 3 mm  subpleural left lower lobe nodule. Recommend attention on follow-up chest CT. Aortic Atherosclerosis (ICD10-I70.0) and Emphysema (ICD10-J43.9). Electronically Signed   By: Annia Belt M.D.   On: 08/18/2017 18:47   [place] Abdomen Limited Ruq  Result Date: 08/19/2017 CLINICAL DATA:  Abdominal distention, elevated LFTs EXAM: ULTRASOUND ABDOMEN LIMITED RIGHT UPPER QUADRANT COMPARISON:  CT abdomen/pelvis dated 02/07/2016 FINDINGS: Gallbladder: No gallstones, gallbladder wall thickening, or  pericholecystic fluid. Mild cholesterolosis. Common bile duct: Diameter: 4 mm Liver: Hyperechoic hepatic parenchyma, raising the possibility of hepatic steatosis. No focal hepatic lesion is seen. Portal vein is patent on color Doppler imaging with normal direction of blood flow towards the liver. IMPRESSION: Possible hepatic steatosis. Electronically Signed   By: Charline Bills M.D.   On: 08/19/2017 08:30    Scheduled Meds: . arformoterol  15 mcg Nebulization BID  . aspirin EC  81 mg Oral Daily  . budesonide (PULMICORT) nebulizer solution  0.5 mg Nebulization BID  . carvedilol  3.125 mg Oral BID WC  . furosemide  20 mg Intravenous BID  . guaiFENesin  600 mg Oral BID  . heparin  5,000 Units Subcutaneous Q8H  . insulin aspart  0-9 Units Subcutaneous TID WC  . insulin glargine  14 Units Subcutaneous QHS  . ipratropium-albuterol  3 mL Nebulization TID  . isosorbide mononitrate  30 mg Oral Daily  . loratadine  10 mg Oral Daily  . methylPREDNISolone (SOLU-MEDROL) injection  60 mg Intravenous Q8H  . pantoprazole  40 mg Oral Daily  . triamcinolone cream  1 application Topical BID   Continuous Infusions: . meropenem (MERREM) IV 1 g (08/20/17 0900)    Principal Problem:   COPD exacerbation (HCC) Active Problems:   GERD (gastroesophageal reflux disease)   Chronic combined systolic and diastolic CHF (congestive heart failure) (HCC)   CKD (chronic kidney disease), stage IV (HCC)   Leukocytosis   Pruritic rash   Pain and swelling of toe of left foot    Time spent: >35 minutes     Esperanza Sheets  [facility] Pager [phone]. If 7PM-7AM, please contact night-coverage at [email or web address], password [facility] 08/20/2017, 9:17 AM  LOS: 6 days

## 2017-08-20 NOTE — Progress Notes (Signed)
Pt. Weaned off of 1L O2. Pt. Tolerated well throughout shift. O2 sats maintained from 93-99%. Pt. Felted comfortable throughout the night. No c/o SOB. Will continue to monitor.

## 2017-08-20 NOTE — Care Management Important Message (Signed)
Important Message  Patient Details  Name: Greg RobertsJonathan Jones MRN: [id number] Date of Birth: 06/02/1928   Medicare Important Message Given:  Yes    Caren MacadamFuller, Flannery Cavallero 08/20/2017, 11:13 AMImportant Message  Patient Details  Name: Greg RobertsJonathan Jones MRN: [id number] Date of Birth: 09/09/1927   Medicare Important Message Given:  Yes    Caren MacadamFuller, Kelin Borum 08/20/2017, 11:13 AM

## 2017-08-20 NOTE — Evaluation (Signed)
Clinical/Bedside Swallow Evaluation Patient Details  Name: Greg RobertsJonathan Jones MRN: [id number] Date of Birth: 06/05/1928  Today's Date: 08/20/2017 Time: SLP Start Time (ACUTE ONLY): 1547 SLP Stop Time (ACUTE ONLY): 1555 SLP Time Calculation (min) (ACUTE ONLY): 8 min  Past Medical History:  Past Medical History:  Diagnosis Date  . CHF (congestive heart failure) (HCC)   . Chronic kidney disease   . COPD (chronic obstructive pulmonary disease) (HCC)   . GERD (gastroesophageal reflux disease)   . Hypertension   . Respiratory insufficiency    Past Surgical History: History reviewed. No pertinent surgical history. HPI:  84 y.o.malewith medical history significant forHTN,CHF, COPDon chronic steroids since 2017,DM Type II diagnosed in 05/2017,CKD stageIV,history of tobacco use, andgerd; who presented with worsening shortness of breath over the last 3-4 days prior to this admission.  Dx aspiration pna, acute COPD exacerbation, acute on chronic CHF.  Pt had clinical swallow evaluation as OP 07/18/16 due to Nov '17 findings of severe esophageal dysmotility with GERD to hypopharynx.    Assessment / Plan / Recommendation Clinical Impression  Pt presents with no overt s/s of an oropharyngeal dysphagia today - there is good oral attention and mastication, good effort, brisk swallow response, and no s/s of aspiration.  No focal deficits. Pt describes no difficulty with swallowing.  November 2017 esophagram showed severe esophageal dysmotility with backflow to hypopharynx, but pt appears to be asymptomatic at this time.  No esophageal s/s observed.  Recommend continuing current diet - no further recommendations. Our services will sign off.  SLP Visit Diagnosis: Dysphagia, unspecified (R13.10)    Aspiration Risk       Diet Recommendation   regular solids, thin liquids  Medication Administration: Whole meds with liquid    Other  Recommendations Oral Care Recommendations: Oral care BID   Follow up  Recommendations None      Frequency and Duration            Prognosis        Swallow Study   General HPI: 84 y.o.malewith medical history significant forHTN,CHF, COPDon chronic steroids since 2017,DM Type II diagnosed in 05/2017,CKD stageIV,history of tobacco use, andgerd; who presented with worsening shortness of breath over the last 3-4 days prior to this admission.  Dx aspiration pna, acute COPD exacerbation, acute on chronic CHF.  Pt had clinical swallow evaluation as OP 07/18/16 due to Nov '17 findings of severe esophageal dysmotility with GERD to hypopharynx.  Type of Study: Bedside Swallow Evaluation Previous Swallow Assessment: see HPI Diet Prior to this Study: Regular;Thin liquids Temperature Spikes Noted: No Respiratory Status: Nasal cannula History of Recent Intubation: No Behavior/Cognition: Alert;Cooperative Oral Cavity Assessment: Within Functional Limits Oral Care Completed by SLP: No Oral Cavity - Dentition: Adequate natural dentition Vision: Functional for self-feeding Self-Feeding Abilities: Able to feed self Patient Positioning: Upright in chair Baseline Vocal Quality: Hoarse Volitional Cough: Strong Volitional Swallow: Able to elicit    Oral/Motor/Sensory Function Overall Oral Motor/Sensory Function: Within functional limits   Ice Chips Ice chips: Within functional limits   Thin Liquid Thin Liquid: Within functional limits    Nectar Thick Nectar Thick Liquid: Not tested   Honey Thick Honey Thick Liquid: Not tested   Puree Puree: Within functional limits   Solid   GO   Solid: Within functional limits        Greg MountsCouture, Greg Jones Greg 08/20/2017,3:58 PM

## 2017-08-20 NOTE — Care Management Note (Signed)
Case Management Note  Patient Details  Name: Greg RobertsJonathan Jones MRN: [id number] Date of Birth: 10/25/1927  Subjective/Objective:     COPD               Action/Plan: Plan to discharge home with his sister and [facility].  [facility]    Expected Discharge Date:  (unknown)               Expected Discharge Plan:  Home w Home Health Services  In-House Referral:  Clinical Social Work  Discharge planning Services  CM Consult  Post Acute Care Choice:    Choice offered to:     DME Arranged:    DME Agency:     HH Arranged:  RN, PT HH Agency:  [facility]  Status of Service:  In process, will continue to follow  If discussed at Long Length of Stay Meetings, dates discussed:    Additional CommentsGeni Bers:  Donald Jacque, RN 08/20/2017, 12:05 PM

## 2017-08-21 LAB — GLUCOSE, CAPILLARY
GLUCOSE-CAPILLARY: 174 mg/dL — AB (ref 65–99)
Glucose-Capillary: 280 mg/dL — ABNORMAL HIGH (ref 65–99)
Glucose-Capillary: 213 mg/dL — ABNORMAL HIGH (ref 65–99)
Glucose-Capillary: 147 mg/dL — ABNORMAL HIGH (ref 65–99)

## 2017-08-21 LAB — COMPREHENSIVE METABOLIC PANEL
ALBUMIN: 2.8 g/dL — AB (ref 3.5–5.0)
ALT: 67 U/L — AB (ref 17–63)
AST: 34 U/L (ref 15–41)
Alkaline Phosphatase: 51 U/L (ref 38–126)
Anion gap: 9 (ref 5–15)
BUN: 58 mg/dL — AB (ref 6–20)
CHLORIDE: 99 mmol/L — AB (ref 101–111)
CO2: 29 mmol/L (ref 22–32)
CREATININE: 2.03 mg/dL — AB (ref 0.61–1.24)
Calcium: 8.9 mg/dL (ref 8.9–10.3)
GFR calc Af Amer: 32 mL/min — ABNORMAL LOW (ref >60)
GFR, EST NON AFRICAN AMERICAN: 28 mL/min — AB (ref >60)
GLUCOSE: 212 mg/dL — AB (ref 65–99)
Potassium: 3.8 mmol/L (ref 3.5–5.1)
Sodium: 137 mmol/L (ref 135–145)
Total Bilirubin: 0.6 mg/dL (ref 0.3–1.2)
Total Protein: 5.7 g/dL — ABNORMAL LOW (ref 6.5–8.1)

## 2017-08-21 MED ORDER — HYDRALAZINE HCL 20 MG/ML IJ SOLN
10.0000 mg | Freq: Once | INTRAMUSCULAR | Status: AC
  Administered 2017-08-21: 10 mg via INTRAVENOUS
  Filled 2017-08-21: qty 1

## 2017-08-21 MED ORDER — SODIUM CHLORIDE 0.9 % IV SOLN
500.0000 mg | Freq: Two times a day (BID) | INTRAVENOUS | Status: DC
  Administered 2017-08-21 – 2017-08-22 (×2): 500 mg via INTRAVENOUS
  Filled 2017-08-21 (×2): qty 500
  Filled 2017-08-21 (×2): qty 0.5

## 2017-08-21 MED ORDER — FUROSEMIDE 10 MG/ML IJ SOLN
20.0000 mg | Freq: Two times a day (BID) | INTRAMUSCULAR | Status: AC
  Administered 2017-08-21 – 2017-08-22 (×2): 20 mg via INTRAVENOUS
  Filled 2017-08-21 (×2): qty 2

## 2017-08-21 MED ORDER — METHYLPREDNISOLONE SODIUM SUCC 40 MG IJ SOLR
40.0000 mg | Freq: Two times a day (BID) | INTRAMUSCULAR | Status: DC
  Administered 2017-08-21 – 2017-08-22 (×2): 40 mg via INTRAVENOUS
  Filled 2017-08-21 (×2): qty 1

## 2017-08-21 NOTE — Progress Notes (Signed)
[facility] PROGRESS NOTE  Greg RobertsJonathan Jones WUJ:[id number] DOB: 09/05/1927 DOA: 08/14/2017 PCP: [name], [name], MD  Brief summary   84 y.o.malewith medical history significant ofHTN,CHF, COPDon chronic steroids since 2017,DM Type II diagnosed in 05/2017,CKD stageIV,history of tobacco use, andgerd; who presented with worsening shortness of breath over the last 3-4 days prior to this admission, associated with productive cough with thick yellowish-green sputum production. Patient reported utilizing albuterol nebulizer 2-3 times per night which is unusual for him. He is not on oxygen at baseline. Associated symptoms include abdominal distention, weight gain of 8-10 lbs in 2 week,decrease urine output,mild lower extremity swelling,andadiffuse red itchy rash of hisentire body that has presented itself in the last 2-3 weeks. [facility] and given some ointment which helps some with his itchingsymptoms,but rash has not significantlyclear.The left toe has also turned red, but has not got into see doctor    Assessment/Plan:  Aspiration pneumonia. CT chest: Endobronchial material within the right lower lobe bronchi suggestive of aspiration. Subpleural ground-glass and consolidative opacities within the right lower lobe may represent aspiration pneumonitis or pneumonia. -cont iv antibiotic treatment, afebrile. Improving. Will transition to oral regimen AM  Acute COPD exacerbation. Copd on chronic steroid treatment at home.  improving with  IV steroids and bronchodilators scheduled and as needed. Will taper to oral regimen soon.   Acute on chronic combined CHF, mld trop elevation, demand ischemia. Echo lvef 44-45%, grade 1 diastolic dysfunction. has been on lasix 40 mg PO QD which is pt's home medical regimen.  trop 0.03, flat pattern, not consistent with ACS -improving with diuresis with iv lasix. Monitor I/o, daily weight. Labs. continue Imdur and  Coreg.    Acute hypoxic respiratory failure. underlying severe copd/emphysema. suspect due to acute on chronic combined CHF, with acute COPD exacerbation, aspiration PNA. CT chest with ? Aspiration PNA -slowly improving as above. Will obtain desat study prior to discharge.   Abd distension, transaminitis. US with no acute abnormalities to explain distension. pt not tender on exam. 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 Monitor on Insulin Glargine 14 U QHS. SSI  CKD 4. renal function overall stable. Monitor   Gout of left great toe improving while on steroids   Skin patchy maculopapular rash on upper torso. overall improving     Code Status: full Family Communication: d/w patient, RN (indicate person spoken with, relationship, and if by phone, the number) Disposition Plan: pend clinical improvement. Home tomorrow   Consultants:  PCCM over the phone   Procedures:  None  Antibiotics:  Levaquin 02/26 -> 03/03  Merrem 03/03 -->   HPI/Subjective: Alert. Reports feeling better, still cough. Afebrile   Objective: Vitals:   08/21/17 0639 08/21/17 0902  BP: (!) 176/76   Pulse: 63   Resp: 18   Temp: 97.7 F (36.5 C)   SpO2: 97% 95%    Intake/Output Summary (Last 24 hours) at 08/21/2017 1215 Last data filed at 08/21/2017 0900 Gross per 24 hour  Intake 1140 ml  Output 1225 ml  Net -85 ml   Filed Weights   08/19/17 0459 08/20/17 0440 08/21/17 0639  Weight: 85.7 kg (188 lb 15 oz) 85.4 kg (188 lb 4.4 oz) 85.2 kg (187 lb 13.3 oz)    Exam:   General:  No distress   Cardiovascular: s1,s2 rrr  Respiratory: few crackles LL  Abdomen: soft, nt, nd   Musculoskeletal: mild pedal edema    Data Reviewed: Basic Metabolic Panel:  Recent Labs  Lab 08/15/17 0746 08/18/17 0413 08/19/17 0502 08/20/17 0454 08/21/17 0543  NA 138 140 137 136 137  K 4.0 3.6 4.1 4.6 3.8  CL 101 102 96* 98* 99*  CO2 24 28 26 28 29   GLUCOSE  148* 187* 135* 195* 212*  BUN 28* 51* 50* 50* 58*  CREATININE 2.41* 2.28* 2.20* 1.97* 2.03*  CALCIUM 9.4 9.2 9.4 8.8* 8.9  MG  --   --  2.4  --   --    Liver Function Tests: Recent Labs  Lab 08/15/17 0746 08/18/17 0413 08/19/17 0502 08/21/17 0543  AST 47* 52* 85* 34  ALT 49 52 103* 67*  ALKPHOS 65 56 57 51  BILITOT 0.6 0.7 0.7 0.6  PROT 7.6 6.2* 6.8 5.7*  ALBUMIN 3.8 3.1* 3.5 2.8*   No results for input(s): LIPASE, AMYLASE in the last 168 hours. No results for input(s): AMMONIA in the last 168 hours. CBC: Recent Labs  Lab 08/14/17 1717 08/15/17 0746 08/18/17 0413 08/19/17 0502 08/20/17 0454  WBC 13.5* 12.1* 13.8* 15.2* 15.0*  NEUTROABS  --   --  11.4*  --   --   HGB 12.5* 12.6* 11.2* 12.7* 12.2*  HCT 40.6 38.9* 35.7* 40.4 37.7*  MCV 90.6 87.8 88.4 88.2 87.5  PLT 301 250 265 315 272   Cardiac Enzymes: Recent Labs  Lab 08/14/17 2207 08/15/17 0746 08/15/17 1045  TROPONINI 0.03* 0.03* 0.03*   BNP (last 3 results) Recent Labs    09/07/16 1831 05/30/17 1447 08/14/17 1717  BNP 82.2 66.8 96.7    ProBNP (last 3 results) No results for input(s): PROBNP in the last 8760 hours.  CBG: Recent Labs  Lab 08/20/17 0728 08/20/17 1159 08/20/17 1648 08/20/17 2123 08/21/17 0952  GLUCAP 194* 259* 199* 250* 174*    No results found for this or any previous visit (from the past 240 hour(s)).   Studies: No results found.  Scheduled Meds: . arformoterol  15 mcg Nebulization BID  . aspirin EC  81 mg Oral Daily  . budesonide (PULMICORT) nebulizer solution  0.5 mg Nebulization BID  . carvedilol  3.125 mg Oral BID WC  . furosemide  20 mg Intravenous BID  . guaiFENesin  600 mg Oral BID  . heparin  5,000 Units Subcutaneous Q8H  . insulin aspart  0-9 Units Subcutaneous TID WC  . insulin glargine  14 Units Subcutaneous QHS  . ipratropium-albuterol  3 mL Nebulization TID  . isosorbide mononitrate  30 mg Oral Daily  . loratadine  10 mg Oral Daily  .  methylPREDNISolone (SOLU-MEDROL) injection  40 mg Intravenous Q8H  . pantoprazole  40 mg Oral Daily  . triamcinolone cream  1 application Topical BID   Continuous Infusions: . meropenem (MERREM) IV Stopped (08/21/17 1042)    Principal Problem:   COPD exacerbation (HCC) Active Problems:   GERD (gastroesophageal reflux disease)   Chronic combined systolic and diastolic CHF (congestive heart failure) (HCC)   CKD (chronic kidney disease), stage IV (HCC)   Leukocytosis   Pruritic rash   Pain and swelling of toe of left foot    Time spent: >35 minutes     Esperanza Sheets  [facility] Pager [phone]. If 7PM-7AM, please contact night-coverage at [email or web address], password [facility] 08/21/2017, 12:15 PM  LOS: 7 days

## 2017-08-21 NOTE — Progress Notes (Signed)
Pharmacy Antibiotic Note  Greg RobertsJonathan Jones is a 84 y.o. male admitted on 08/14/2017 with COPD exacerbation, started on Levaquin.  Pharmacy has been consulted for Meropenem dosing for aspiration pneumonia given PCN allergy with no documentation of PCN or cephalosporin use.  Today, 08/21/2017 Day #7 antibiotics Afebrile WBC 15 (on steroids) SCr 2.03, CrCl ~26 ml/min  Plan: Decrease Meropenem to 500mg  IV q12h- recommended dose for CrCl 10-1529ml/min.  Follow renal function and adjust as needed- Notes plans to transition to oral abx in am.   Height: 5' 7.5" (171.5 cm) Weight: 187 lb 13.3 oz (85.2 kg) IBW/kg (Calculated) : 67.25  Temp (24hrs), Avg:98.1 F (36.7 C), Min:97.7 F (36.5 C), Max:98.4 F (36.9 C)  Recent Labs  Lab 08/14/17 1717 08/15/17 0746 08/18/17 0413 08/19/17 0502 08/20/17 0454 08/21/17 0543  WBC 13.5* 12.1* 13.8* 15.2* 15.0*  --   CREATININE 2.24* 2.41* 2.28* 2.20* 1.97* 2.03*    Estimated Creatinine Clearance: 26.5 mL/min (A) (by C-G formula based on SCr of 2.03 mg/dL (H)).    Allergies  Allergen Reactions  . Other Other (See Comments)    Oxygen Sensitive  . Penicillins Rash and Other (See Comments)    Any -illins as well as Mycins Has patient had a PCN reaction causing immediate rash, facial/tongue/throat swelling, SOB or lightheadedness with hypotension: Yes Has patient had a PCN reaction causing severe rash involving mucus membranes or skin necrosis: Yes Has patient had a PCN reaction that required hospitalization No Has patient had a PCN reaction occurring within the last 10 years: No If all of the above answers are "NO", then may proceed with Cephalosporin use.   . Vitamin B12 Itching, Rash and Other (See Comments)    injections    Antimicrobials this admission:  2/27 Levaquin >> 3/3 3/3 Meropenem >>  Dose adjustments this admission:  3/5 Dec Meropenem to 500mg  IV q12h  Microbiology results:  none  Thank you for allowing pharmacy to be a part  of this patient's care.  Junita PushMichelle Jahid Weida, PharmD, BCPS Pager: [phone] 08/21/2017 2:00 PM

## 2017-08-22 LAB — GLUCOSE, CAPILLARY
Glucose-Capillary: 234 mg/dL — ABNORMAL HIGH (ref 65–99)
Glucose-Capillary: 260 mg/dL — ABNORMAL HIGH (ref 65–99)

## 2017-08-22 MED ORDER — PREDNISONE 10 MG PO TABS: 10.0000 mg | ORAL_TABLET | Freq: Every day | ORAL | 0 refills | Status: DC

## 2017-08-22 MED ORDER — ALBUTEROL SULFATE HFA 108 (90 BASE) MCG/ACT IN AERS
2.0000 | INHALATION_SPRAY | Freq: Four times a day (QID) | RESPIRATORY_TRACT | 2 refills | Status: DC | PRN
Start: 2017-08-22 — End: 2018-01-17

## 2017-08-22 MED ORDER — CLINDAMYCIN HCL 150 MG PO CAPS: 150.0000 mg | ORAL_CAPSULE | Freq: Three times a day (TID) | ORAL | 0 refills | Status: AC

## 2017-08-22 NOTE — Care Management Note (Signed)
Case Management Note  Patient Details  Name: Greg RobertsJonathan Jones MRN: [id number] Date of Birth: 05/11/1928  Subjective/Objective: [facility] rep [name] aware of d/c w/HHC. No further CM needs.                   Action/Plan:d/c home w/HHC.   Expected Discharge Date:  08/22/17               Expected Discharge Plan:  Home w Home Health Services  In-House Referral:  Clinical Social Work  Discharge planning Services  CM Consult  Post Acute Care Choice:    Choice offered to:     DME Arranged:    DME Agency:     HH Arranged:  RN, PT HH Agency:  [facility]  Status of Service:  Completed, signed off  If discussed at Long Length of Stay Meetings, dates discussed:    Additional Comments:  Lanier ClamMahabir, Sahej Schrieber, RN 08/22/2017, 11:24 AM

## 2017-08-22 NOTE — Discharge Summary (Signed)
Physician Discharge Summary  Greg Jones ZOX:[id number] DOB: [birth year] DOA: 08/14/2017  PCP: Margot Ables, MD  Admit date: 08/14/2017 Discharge date: 08/22/2017  Time spent: >35 minutes  Recommendations for Outpatient Follow-up:  F/u with PCP in 3-5 days F/u with Pulmonology in 2-3 weeks   Discharge Diagnoses:  Principal Problem:   COPD exacerbation (HCC) Active Problems:   GERD (gastroesophageal reflux disease)   Chronic combined systolic and diastolic CHF (congestive heart failure) (HCC)   CKD (chronic kidney disease), stage IV (HCC)   Leukocytosis   Pruritic rash   Pain and swelling of toe of left foot   Discharge Condition: stable   Diet recommendation: low sodium. Carb modified   Filed Weights   08/20/17 0440 08/21/17 0639 08/22/17 0700  Weight: 85.4 kg (188 lb 4.4 oz) 85.2 kg (187 lb 13.3 oz) 88.8 kg (195 lb 12.3 oz)    History of present illness:   84 y.o.malewith medical history significant ofHTN,CHF, COPDon chronic steroids since 2017,DM Type II diagnosed in 05/2017,CKD stageIV,history of tobacco use, andgerd; who presented with worsening shortness of breath over the last 3-4 days prior to this admission, associated with productive cough with thick yellowish-green sputum production. Patient reported utilizing albuterol nebulizer 2-3 times per night which is unusual for him. He is not on oxygen at baseline. Associated symptoms include abdominal distention, weight gain of 8-10 lbs in 2 week,decrease urine output,mild lower extremity swelling,andadiffuse red itchy rash of hisentire body that has presented itself in the last 2-3 weeks. He was seen at the [facility] and given some ointment which helps some with his itchingsymptoms,but rash has not significantlyclear.The left toe has also turned red, but has not got into see doctor    Hospital Course:   Possible aspiration pneumonia. CT chest: Endobronchial material within the right  lower lobe bronchi suggestive of aspiration. Subpleural ground-glass and consolidative opacities within the right lower lobe may represent aspiration pneumonitis or pneumonia. -continued to improve with iv antibiotic treatment with imipenem. Remains afebrile. No more wheezing. transitioned to oral regimen for 2 more days to complete the course   AcuteCOPD exacerbation. Copd on chronic steroid treatment at home at 10 mg daily.  improved with short course IV steroids and bronchodilators scheduled and as needed. taper to home oral regimen steroids, recommended to cont pulmonology f/u in 2-3 weeks   Acute on chronic combined CHF, mld trop elevation, demand ischemia. Echo lvef 44-45%, grade 1 diastolic dysfunction. has been on lasix 40 mg PO QD which is pt's home medical regimen.  trop 0.03, flat pattern, not consistent with ACS -improved, diuresed well with diuresis with iv lasix. Then transitioned to his home regimen.   Acute hypoxic respiratory failure. underlying severe copd/emphysema. suspect due to acute on chronic combined CHF, with acute COPD exacerbation, aspiration PNA. CT chest with ? Aspiration PNA -resolved with above treatment   Abd distension, transaminitis. USwith no acute abnormalities to explain distension. pt not tender on exam. 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  Resumed home Glargine 14 U QHS.  CKD 4. renal function overall stable.   Gout of left great toe improved while on steroids   Skin patchy maculopapular rash on upper torso. overall improving    Procedures:  none (i.e. Studies not automatically included, echos, thoracentesis, etc; not x-rays)  Consultations:  none  Discharge Exam: Vitals:   08/22/17 0521 08/22/17 0823  BP: (!) 166/75   Pulse:    Resp:    Temp:  SpO2:  94%    General: alert. No distress  Cardiovascular: s1,s2 rrr Respiratory: no wheezing   Discharge  Instructions  Discharge Instructions    Diet - low sodium heart healthy   Complete by:  As directed    Increase activity slowly   Complete by:  As directed      Allergies as of 08/22/2017      Reactions   Other Other (See Comments)   Oxygen Sensitive   Penicillins Rash, Other (See Comments)   Any -illins as well as Mycins Has patient had a PCN reaction causing immediate rash, facial/tongue/throat swelling, SOB or lightheadedness with hypotension: Yes Has patient had a PCN reaction causing severe rash involving mucus membranes or skin necrosis: Yes Has patient had a PCN reaction that required hospitalization No Has patient had a PCN reaction occurring within the last 10 years: No If all of the above answers are "NO", then may proceed with Cephalosporin use.   Vitamin B12 Itching, Rash, Other (See Comments)   injections      Medication List    TAKE these medications   albuterol (2.5 MG/3ML) 0.083% nebulizer solution Commonly known as:  PROVENTIL Take 3 mLs (2.5 mg total) by nebulization every 6 (six) hours. DX:  J44.9 What changed:  Another medication with the same name was added. Make sure you understand how and when to take each.   albuterol 108 (90 Base) MCG/ACT inhaler Commonly known as:  PROVENTIL HFA;VENTOLIN HFA Inhale 2 puffs into the lungs every 6 (six) hours as needed for wheezing or shortness of breath. What changed:  You were already taking a medication with the same name, and this prescription was added. Make sure you understand how and when to take each.   amLODipine 5 MG tablet Commonly known as:  NORVASC Take 1 tablet (5 mg total) by mouth daily.   aspirin EC 81 MG tablet Take 81 mg by mouth daily.   budesonide-formoterol 160-4.5 MCG/ACT inhaler Commonly known as:  SYMBICORT Inhale 2 puffs into the lungs 2 (two) times daily.   clindamycin 150 MG capsule Commonly known as:  CLEOCIN Take 1 capsule (150 mg total) by mouth 3 (three) times daily for 2 days.    furosemide 20 MG tablet Commonly known as:  LASIX Take 2 tablets (40 mg total) by mouth daily.   insulin glargine 100 UNIT/ML injection Commonly known as:  LANTUS Inject 14 Units into the skin at bedtime.   isosorbide mononitrate 30 MG 24 hr tablet Commonly known as:  IMDUR Take 30 mg by mouth daily.   loratadine 10 MG tablet Commonly known as:  CLARITIN Take 10 mg by mouth daily.   omeprazole 20 MG capsule Commonly known as:  PRILOSEC Take 20 mg by mouth daily.   predniSONE 10 MG tablet Commonly known as:  DELTASONE Take 1 tablet (10 mg total) by mouth daily. What changed:    how much to take  how to take this  when to take this  additional instructions   tiotropium 18 MCG inhalation capsule Commonly known as:  SPIRIVA Place 18 mcg into inhaler and inhale daily.   triamcinolone cream 0.1 % Commonly known as:  KENALOG Apply 1 application topically 2 (two) times daily.   Vitamin D3 1000 units Caps Take 1 capsule by mouth daily.      Allergies  Allergen Reactions  . Other Other (See Comments)    Oxygen Sensitive  . Penicillins Rash and Other (See Comments)    Any -  illins as well as Mycins Has patient had a PCN reaction causing immediate rash, facial/tongue/throat swelling, SOB or lightheadedness with hypotension: Yes Has patient had a PCN reaction causing severe rash involving mucus membranes or skin necrosis: Yes Has patient had a PCN reaction that required hospitalization No Has patient had a PCN reaction occurring within the last 10 years: No If all of the above answers are "NO", then may proceed with Cephalosporin use.   . Vitamin B12 Itching, Rash and Other (See Comments)    injections   Follow-up Information    [facility] Follow up.   Specialty:  Home Health Services Why:  [facility] nursing/physical therapy Contact information: [address] [place] [address] [phone]            The results of significant  diagnostics from this hospitalization (including imaging, microbiology, ancillary and laboratory) are listed below for reference.    Significant Diagnostic Studies: Ct Chest Wo Contrast  Result Date: 08/18/2017 CLINICAL DATA:  Acute respiratory failure. EXAM: CT CHEST WITHOUT CONTRAST TECHNIQUE: Multidetector CT imaging of the chest was performed following the standard protocol without IV contrast. COMPARISON:  Chest radiograph 08/14/2017. FINDINGS: Cardiovascular: Normal heart size. No pericardial effusion. Coronary arterial vascular calcifications. Thoracic aortic vascular calcifications. Possible small outpouching of the descending thoracic aorta measuring 8 mm versus periaortic lymph node (image 98; series 2). Mediastinum/Nodes: No enlarged axillary, mediastinal or hilar lymphadenopathy. Normal esophagus. Fluid within the esophagus. Lungs/Pleura: Central airways are patent. Centrilobular and paraseptal emphysematous changes. 3 mm subpleural left lower lobe nodule (image 86; series 7). Apical bullous change. Subpleural ground-glass and consolidative opacities within the right lower lobe. Possible irregular nodule within the right lower lobe measuring 8 mm (image 104; series 7). No pleural effusion or pneumothorax. In a bronchial material within the right lower lobe bronchi. Upper Abdomen: Liver is normal in size and contour. Gallbladder is unremarkable. Fatty atrophy of the pancreas. Spleen is unremarkable. Adrenal glands are normal in appearance. Infrarenal abdominal aortic aneurysm measuring 3.1 cm. Musculoskeletal: Thoracic spine degenerative changes. No aggressive or acute appearing osseous lesions. IMPRESSION: 1. Endobronchial material within the right lower lobe bronchi suggestive of aspiration. Subpleural ground-glass and consolidative opacities within the right lower lobe may represent aspiration pneumonitis or pneumonia. 2. Possible small outpouching of the descending thoracic aorta. This is  incompletely evaluated without intravenous contrast material. Consider further evaluation with contrast-enhanced CT, potentially at the time of follow-up exam. 3. Suggestion of possible small irregular nodule within the right lower lobe versus infectious process. Recommend follow-up chest CT in 6-8 weeks to ensure resolution. 4. 3 mm subpleural left lower lobe nodule. Recommend attention on follow-up chest CT. Aortic Atherosclerosis (ICD10-I70.0) and Emphysema (ICD10-J43.9). Electronically Signed   By: Annia Belt M.D.   On: 08/18/2017 18:47   Dg Chest Port 1 View  Result Date: 08/14/2017 CLINICAL DATA:  84 year old male with shortness of breath and productive cough for 5 days. Initial encounter. EXAM: PORTABLE CHEST 1 VIEW COMPARISON:  05/30/2017 chest x-ray. FINDINGS: No infiltrate, congestive heart failure or pneumothorax. Mild cardiomegaly. Tortuous minimally calcified thoracic aorta. No acute osseous abnormality. IMPRESSION: No infiltrate or congestive heart failure. Aortic Atherosclerosis (ICD10-I70.0).  Tortuous aorta. Mild cardiomegaly. Electronically Signed   By: Lacy Duverney M.D.   On: 08/14/2017 17:07   US Abdomen Limited Ruq  Result Date: 08/19/2017 CLINICAL DATA:  Abdominal distention, elevated LFTs EXAM: ULTRASOUND ABDOMEN LIMITED RIGHT UPPER QUADRANT COMPARISON:  CT abdomen/pelvis dated 02/07/2016 FINDINGS: Gallbladder: No gallstones, gallbladder  wall thickening, or pericholecystic fluid. Mild cholesterolosis. Common bile duct: Diameter: 4 mm Liver: Hyperechoic hepatic parenchyma, raising the possibility of hepatic steatosis. No focal hepatic lesion is seen. Portal vein is patent on color Doppler imaging with normal direction of blood flow towards the liver. IMPRESSION: Possible hepatic steatosis. Electronically Signed   By: Charline Bills M.D.   On: 08/19/2017 08:30    Microbiology: No results found for this or any previous visit (from the past 240 hour(s)).   Labs: Basic Metabolic  Panel: Recent Labs  Lab 08/18/17 0413 08/19/17 0502 08/20/17 0454 08/21/17 0543  NA 140 137 136 137  K 3.6 4.1 4.6 3.8  CL 102 96* 98* 99*  CO2 28 26 28 29   GLUCOSE 187* 135* 195* 212*  BUN 51* 50* 50* 58*  CREATININE 2.28* 2.20* 1.97* 2.03*  CALCIUM 9.2 9.4 8.8* 8.9  MG  --  2.4  --   --    Liver Function Tests: Recent Labs  Lab 08/18/17 0413 08/19/17 0502 08/21/17 0543  AST 52* 85* 34  ALT 52 103* 67*  ALKPHOS 56 57 51  BILITOT 0.7 0.7 0.6  PROT 6.2* 6.8 5.7*  ALBUMIN 3.1* 3.5 2.8*   No results for input(s): LIPASE, AMYLASE in the last 168 hours. No results for input(s): AMMONIA in the last 168 hours. CBC: Recent Labs  Lab 08/18/17 0413 08/19/17 0502 08/20/17 0454  WBC 13.8* 15.2* 15.0*  NEUTROABS 11.4*  --   --   HGB 11.2* 12.7* 12.2*  HCT 35.7* 40.4 37.7*  MCV 88.4 88.2 87.5  PLT 265 315 272   Cardiac Enzymes: Recent Labs  Lab 08/15/17 1045  TROPONINI 0.03*   BNP: BNP (last 3 results) Recent Labs    09/07/16 1831 05/30/17 1447 08/14/17 1717  BNP 82.2 66.8 96.7    ProBNP (last 3 results) No results for input(s): PROBNP in the last 8760 hours.  CBG: Recent Labs  Lab 08/21/17 0952 08/21/17 1241 08/21/17 1659 08/21/17 2105 08/22/17 0800  GLUCAP 174* 280* 213* 147* 234*       Signed:  Niana Martorana N  [facility] 08/22/2017, 9:45 AM

## 2017-10-03 ENCOUNTER — Ambulatory Visit: Payer: Medicare Other | Admitting: Family Medicine

## 2017-10-03 ENCOUNTER — Emergency Department (HOSPITAL_COMMUNITY): Payer: Medicare Other

## 2017-10-03 ENCOUNTER — Encounter (HOSPITAL_COMMUNITY): Payer: Self-pay | Admitting: *Deleted

## 2017-10-03 ENCOUNTER — Emergency Department (HOSPITAL_COMMUNITY)
Admission: EM | Admit: 2017-10-03 | Discharge: 2017-10-03 | Disposition: A | Discharge status: Home / Self Care | Payer: Medicare Other | Attending: Emergency Medicine | Admitting: Emergency Medicine

## 2017-10-03 DIAGNOSIS — R05 Cough: Secondary | ICD-10-CM | POA: Insufficient documentation

## 2017-10-03 DIAGNOSIS — Z7982 Long term (current) use of aspirin: Secondary | ICD-10-CM | POA: Insufficient documentation

## 2017-10-03 DIAGNOSIS — R21 Rash and other nonspecific skin eruption: Secondary | ICD-10-CM | POA: Diagnosis not present

## 2017-10-03 DIAGNOSIS — R600 Localized edema: Secondary | ICD-10-CM | POA: Diagnosis not present

## 2017-10-03 DIAGNOSIS — I5042 Chronic combined systolic (congestive) and diastolic (congestive) heart failure: Secondary | ICD-10-CM | POA: Insufficient documentation

## 2017-10-03 DIAGNOSIS — Z87891 Personal history of nicotine dependence: Secondary | ICD-10-CM | POA: Diagnosis not present

## 2017-10-03 DIAGNOSIS — I130 Hypertensive heart and chronic kidney disease with heart failure and stage 1 through stage 4 chronic kidney disease, or unspecified chronic kidney disease: Secondary | ICD-10-CM | POA: Insufficient documentation

## 2017-10-03 DIAGNOSIS — Z79899 Other long term (current) drug therapy: Secondary | ICD-10-CM | POA: Diagnosis not present

## 2017-10-03 DIAGNOSIS — N184 Chronic kidney disease, stage 4 (severe): Secondary | ICD-10-CM | POA: Diagnosis not present

## 2017-10-03 DIAGNOSIS — J441 Chronic obstructive pulmonary disease with (acute) exacerbation: Secondary | ICD-10-CM | POA: Diagnosis not present

## 2017-10-03 DIAGNOSIS — R0602 Shortness of breath: Secondary | ICD-10-CM | POA: Diagnosis present

## 2017-10-03 LAB — CBC WITH DIFFERENTIAL/PLATELET
BASOS ABS: 0 10*3/uL (ref 0.0–0.1)
BASOS PCT: 0 %
EOS ABS: 0.2 10*3/uL (ref 0.0–0.7)
EOS PCT: 2 %
HCT: 39.1 % (ref 39.0–52.0)
HEMOGLOBIN: 12.2 g/dL — AB (ref 13.0–17.0)
LYMPHS ABS: 1.2 10*3/uL (ref 0.7–4.0)
Lymphocytes Relative: 11 %
MCH: 27.6 pg (ref 26.0–34.0)
MCHC: 31.2 g/dL (ref 30.0–36.0)
MCV: 88.5 fL (ref 78.0–100.0)
Monocytes Absolute: 0.5 10*3/uL (ref 0.1–1.0)
Monocytes Relative: 5 %
NEUTROS PCT: 82 %
Neutro Abs: 9 10*3/uL — ABNORMAL HIGH (ref 1.7–7.7)
PLATELETS: 235 10*3/uL (ref 150–400)
RBC: 4.42 MIL/uL (ref 4.22–5.81)
RDW: 17.3 % — ABNORMAL HIGH (ref 11.5–15.5)
WBC: 10.9 10*3/uL — AB (ref 4.0–10.5)

## 2017-10-03 LAB — BASIC METABOLIC PANEL
ANION GAP: 15 (ref 5–15)
BUN: 30 mg/dL — ABNORMAL HIGH (ref 6–20)
CHLORIDE: 101 mmol/L (ref 101–111)
CO2: 25 mmol/L (ref 22–32)
Calcium: 9.7 mg/dL (ref 8.9–10.3)
Creatinine, Ser: 2.34 mg/dL — ABNORMAL HIGH (ref 0.61–1.24)
GFR, EST AFRICAN AMERICAN: 27 mL/min — AB (ref >60)
GFR, EST NON AFRICAN AMERICAN: 23 mL/min — AB (ref >60)
Glucose, Bld: 236 mg/dL — ABNORMAL HIGH (ref 65–99)
POTASSIUM: 4.3 mmol/L (ref 3.5–5.1)
SODIUM: 141 mmol/L (ref 135–145)

## 2017-10-03 LAB — I-STAT TROPONIN, ED: TROPONIN I, POC: 0.03 ng/mL (ref 0.00–0.08)

## 2017-10-03 MED ORDER — PREDNISONE 20 MG PO TABS
60.0000 mg | ORAL_TABLET | Freq: Once | ORAL | Status: AC
  Administered 2017-10-03: 60 mg via ORAL
  Filled 2017-10-03: qty 3

## 2017-10-03 MED ORDER — IPRATROPIUM-ALBUTEROL 0.5-2.5 (3) MG/3ML IN SOLN
3.0000 mL | RESPIRATORY_TRACT | Status: AC
  Administered 2017-10-03 (×3): 3 mL via RESPIRATORY_TRACT
  Filled 2017-10-03 (×3): qty 3

## 2017-10-03 MED ORDER — PREDNISONE 20 MG PO TABS: ORAL_TABLET | ORAL | 0 refills | Status: DC

## 2017-10-03 NOTE — Progress Notes (Signed)
[facility]  Patient Status: Active (receiving services up to time of hospitalization)  [facility] is providing the following services: RN, PT, OT and ST  If patient discharges after hours, please call [phone].   Avie EchevariaKaren Nussbaum 10/03/2017, 12:47 PM

## 2017-10-03 NOTE — ED Provider Notes (Signed)
[facility]-EMERGENCY DEPT Provider Note   CSN: [id number] Arrival date & time: 10/03/17  1054     History   Chief Complaint Chief Complaint  Patient presents with  . Shortness of Breath    HPI Greg Jones is a 84 y.o. male.  84 yo  M with a chief complaint shortness of breath.  This been going on for the past few days.  The patient has had a mild increase of his cough.  Feels that his sputum is the same hand is a little bit less than normal.  Denies any worsening lower extremity edema.  Denies fevers or chills.  Denies chest pain.  His abdomen is distended this is been going on for the past couple years.  Denies any new medications.  The history is provided by the patient.  Shortness of Breath  This is a new problem. The average episode lasts 2 days. The problem occurs continuously.The current episode started 2 days ago. The problem has been gradually worsening. Associated symptoms include cough and wheezing. Pertinent negatives include no fever, no headaches, no chest pain, no vomiting, no abdominal pain and no rash. He has tried inhaled steroids and beta-agonist inhalers for the symptoms. The treatment provided mild relief. He has had prior hospitalizations. He has had prior ED visits. Associated medical issues include COPD.    Past Medical History:  Diagnosis Date  . CHF (congestive heart failure) (HCC)   . Chronic kidney disease   . COPD (chronic obstructive pulmonary disease) (HCC)   . GERD (gastroesophageal reflux disease)   . Hypertension   . Respiratory insufficiency     Patient Active Problem List   Diagnosis Date Noted  . Pruritic rash 08/14/2017  . Pain and swelling of toe of left foot 08/14/2017  . Pressure injury of skin 04/12/2016  . Near syncope 04/11/2016  . Pre-syncope 04/11/2016  . CKD (chronic kidney disease), stage IV (HCC) 04/11/2016  . QT prolongation 04/11/2016  . Leukocytosis 04/11/2016  . Hypokalemia 04/11/2016  . Blood  glucose elevated 04/11/2016  . Chronic combined systolic and diastolic CHF (congestive heart failure) (HCC) 01/11/2016  . Dyspnea   . COPD exacerbation (HCC) 09/07/2015  . GERD (gastroesophageal reflux disease) 05/28/2015  . COPD (chronic obstructive pulmonary disease) (HCC) 05/10/2015  . Essential hypertension 05/10/2015    History reviewed. No pertinent surgical history.      Home Medications    Prior to Admission medications   Medication Sig Start Date End Date Taking? Authorizing Provider  albuterol (PROVENTIL HFA;VENTOLIN HFA) 108 (90 Base) MCG/ACT inhaler Inhale 2 puffs into the lungs every 6 (six) hours as needed for wheezing or shortness of breath. 08/22/17   Esperanza Sheets, MD  albuterol (PROVENTIL) (2.5 MG/3ML) 0.083% nebulizer solution Take 3 mLs (2.5 mg total) by nebulization every 6 (six) hours. DX:  J44.9 03/23/17   Oretha Milch, MD  amLODipine (NORVASC) 5 MG tablet Take 1 tablet (5 mg total) by mouth daily. 09/14/15   Simonne Martinet, NP  aspirin EC 81 MG tablet Take 81 mg by mouth daily.    [provider]  budesonide-formoterol (SYMBICORT) 160-4.5 MCG/ACT inhaler Inhale 2 puffs into the lungs 2 (two) times daily.    [provider]  Cholecalciferol (VITAMIN D3) 1000 units CAPS Take 1 capsule by mouth daily.    [provider]  furosemide (LASIX) 20 MG tablet Take 2 tablets (40 mg total) by mouth daily. 09/11/16   Orpah Cobb, MD  insulin glargine (  LANTUS) 100 UNIT/ML injection Inject 14 Units into the skin at bedtime.    [provider]  isosorbide mononitrate (IMDUR) 30 MG 24 hr tablet Take 30 mg by mouth daily.    [provider]  loratadine (CLARITIN) 10 MG tablet Take 10 mg by mouth daily.    [provider]  omeprazole (PRILOSEC) 20 MG capsule Take 20 mg by mouth daily.    [provider]  predniSONE (DELTASONE) 10 MG tablet Take 1 tablet (10 mg total) by mouth daily. 08/22/17   Esperanza Sheets, MD    predniSONE (DELTASONE) 20 MG tablet 2 tabs po daily x 4 days 10/03/17   Melene Plan, DO  tiotropium (SPIRIVA) 18 MCG inhalation capsule Place 18 mcg into inhaler and inhale daily.    [provider]  triamcinolone cream (KENALOG) 0.1 % Apply 1 application topically 2 (two) times daily.    [provider]    Family History Family History  Problem Relation Age of Onset  . Tuberculosis Mother   . Diabetes Father     Social History Social History   Tobacco Use  . Smoking status: Former Smoker    Packs/day: 0.50    Years: 70.00    Pack years: 35.00    Types: Cigarettes    Last attempt to quit: 04/19/2013    Years since quitting: 4.4  . Smokeless tobacco: Never Used  Substance Use Topics  . Alcohol use: No    Alcohol/week: 0.0 oz  . Drug use: No     Allergies   Other; Penicillins; and Vitamin b12   Review of Systems Review of Systems  Constitutional: Negative for chills and fever.  HENT: Negative for congestion and facial swelling.   Eyes: Negative for discharge and visual disturbance.  Respiratory: Positive for cough, shortness of breath and wheezing.   Cardiovascular: Negative for chest pain and palpitations.  Gastrointestinal: Negative for abdominal pain, diarrhea and vomiting.  Musculoskeletal: Negative for arthralgias and myalgias.  Skin: Negative for color change and rash.  Neurological: Negative for tremors, syncope and headaches.  Psychiatric/Behavioral: Negative for confusion and dysphoric mood.     Physical Exam Updated Vital Signs BP 138/70   Pulse 96   Temp 97.8 F (36.6 C) (Oral)   Resp 16   SpO2 100%   Physical Exam  Constitutional: He is oriented to person, place, and time. He appears well-developed and well-nourished.  HENT:  Head: Normocephalic and atraumatic.  Eyes: Pupils are equal, round, and reactive to light. EOM are normal.  Neck: Normal range of motion. Neck supple. No JVD present.  Cardiovascular: Normal rate and  regular rhythm. Exam reveals no gallop and no friction rub.  No murmur heard. Pulmonary/Chest: No respiratory distress. He has wheezes.  Abdominal: He exhibits no distension and no mass. There is no tenderness. There is no rebound and no guarding.  Musculoskeletal: Normal range of motion. He exhibits edema (trace bilaterally).  Neurological: He is alert and oriented to person, place, and time.  Skin: Rash noted. No pallor.  Diffuse erythematous rash worse along the upper extremities and the trunk.  Pruritic  Psychiatric: He has a normal mood and affect. His behavior is normal.  Nursing note and vitals reviewed.    ED Treatments / Results  Labs (all labs ordered are listed, but only abnormal results are displayed) Labs Reviewed  CBC WITH DIFFERENTIAL/PLATELET - Abnormal; Notable for the following components:      Result Value   WBC 10.9 (*)  Hemoglobin 12.2 (*)    RDW 17.3 (*)    Neutro Abs 9.0 (*)    All other components within normal limits  BASIC METABOLIC PANEL - Abnormal; Notable for the following components:   Glucose, Bld 236 (*)    BUN 30 (*)    Creatinine, Ser 2.34 (*)    GFR calc non Af Amer 23 (*)    GFR calc Af Amer 27 (*)    All other components within normal limits  I-STAT TROPONIN, ED    EKG EKG Interpretation  Date/Time:  Wednesday October 03 2017 11:07:33 EDT Ventricular Rate:  108 PR Interval:    QRS Duration: 72 QT Interval:  394 QTC Calculation: 529 R Axis:   -13 Text Interpretation:  Sinus tachycardia Abnormal R-wave progression, early transition Nonspecific T abnormalities, diffuse leads Prolonged QT interval No significant change since last tracing Confirmed by Melene Plan [phone]) on 10/03/2017 11:39:03 AM   Radiology Dg Chest 2 View  Result Date: 10/03/2017 CLINICAL DATA:  Shortness of breath EXAM: CHEST - 2 VIEW COMPARISON:  CT chest of 08/18/2017 and chest x-ray of 08/14/2016 FINDINGS: No pneumonia or effusion is seen. Lungs remain somewhat  hyperaerated. Mediastinal and hilar contours are stable and heart size is stable. No bony abnormality is seen. IMPRESSION: No active cardiopulmonary disease. Electronically Signed   By: Dwyane Dee M.D.   On: 10/03/2017 12:05    Procedures Procedures (including critical care time)  Medications Ordered in ED Medications  ipratropium-albuterol (DUONEB) 0.5-2.5 (3) MG/3ML nebulizer solution 3 mL (3 mLs Nebulization Given 10/03/17 1136)  predniSONE (DELTASONE) tablet 60 mg (60 mg Oral Given 10/03/17 1120)     Initial Impression / Assessment and Plan / ED Course  I have reviewed the triage vital signs and the nursing notes.  Pertinent labs & imaging results that were available during my care of the patient were reviewed by me and considered in my medical decision making (see chart for details).     84 yo M with a chief complaint of shortness of breath.  Most likely COPD by history and physical.  He feels much better after breathing treatment in triage.  He still wheezing.  Will give 3 duo nebs back-to-back and steroids and reassess.  Patient doing much better post duo nebs.  Clear lung sounds on reassessment.  Chest x-ray negative for pneumonia.  Discharge home on steroids.  3:45 PM:  I have discussed the diagnosis/risks/treatment options with the patient and believe the pt to be eligible for discharge home to follow-up with PCP. We also discussed returning to the ED immediately if new or worsening sx occur. We discussed the sx which are most concerning (e.g., sudden worsening pain, fever, inability to tolerate by mouth) that necessitate immediate return. Medications administered to the patient during their visit and any new prescriptions provided to the patient are listed below.  Medications given during this visit Medications  ipratropium-albuterol (DUONEB) 0.5-2.5 (3) MG/3ML nebulizer solution 3 mL (3 mLs Nebulization Given 10/03/17 1136)  predniSONE (DELTASONE) tablet 60 mg (60 mg Oral Given  10/03/17 1120)    Labs reviewed mild leukocytosis mildly lower than his baseline, creatinine at baseline Images reviewed chest x-ray with no focal infiltrate DDX viral syndrome, early pneumonia, CHF Old records reviewed multiple pulm visits for COPD exacerbations  The patient appears reasonably screen and/or stabilized for discharge and I doubt any other medical condition or other [facility] requiring further screening, evaluation, or treatment in the ED at this time prior to  discharge.    Final Clinical Impressions(s) / ED Diagnoses   Final diagnoses:  COPD exacerbation [facility](HCC)    ED Discharge Orders        Ordered    predniSONE (DELTASONE) 20 MG tablet     10/03/17 1229       Melene PlanFloyd, Shaneika Rossa, DO 10/03/17 1545

## 2017-10-03 NOTE — ED Notes (Signed)
Bed: WA14 Expected date:  Expected time:  Means of arrival:  Comments: EMS/shob 

## 2017-10-03 NOTE — ED Triage Notes (Signed)
He reports shortness of breath, which today was not made better by his use of his inhaler. He arrives here short of breath and in no distress. He rec'd. Two Duoneb treatments en route to hospital.

## 2017-10-12 ENCOUNTER — Encounter: Payer: Self-pay | Admitting: Family Medicine

## 2017-10-12 ENCOUNTER — Ambulatory Visit (INDEPENDENT_AMBULATORY_CARE_PROVIDER_SITE_OTHER): Payer: Medicare Other | Admitting: Family Medicine

## 2017-10-12 ENCOUNTER — Telehealth: Payer: Self-pay | Admitting: Family Medicine

## 2017-10-12 VITALS — BP 122/64 | HR 110 | Temp 98.2°F | Wt 187.0 lb

## 2017-10-12 DIAGNOSIS — E1169 Type 2 diabetes mellitus with other specified complication: Secondary | ICD-10-CM | POA: Diagnosis not present

## 2017-10-12 DIAGNOSIS — Z7689 Persons encountering health services in other specified circumstances: Secondary | ICD-10-CM | POA: Diagnosis not present

## 2017-10-12 DIAGNOSIS — I10 Essential (primary) hypertension: Secondary | ICD-10-CM | POA: Diagnosis not present

## 2017-10-12 DIAGNOSIS — J449 Chronic obstructive pulmonary disease, unspecified: Secondary | ICD-10-CM

## 2017-10-12 DIAGNOSIS — Z794 Long term (current) use of insulin: Secondary | ICD-10-CM

## 2017-10-12 DIAGNOSIS — R21 Rash and other nonspecific skin eruption: Secondary | ICD-10-CM | POA: Diagnosis not present

## 2017-10-12 DIAGNOSIS — N184 Chronic kidney disease, stage 4 (severe): Secondary | ICD-10-CM

## 2017-10-12 DIAGNOSIS — K219 Gastro-esophageal reflux disease without esophagitis: Secondary | ICD-10-CM

## 2017-10-12 MED ORDER — HYDROXYZINE HCL 25 MG PO TABS: 25.0000 mg | ORAL_TABLET | Freq: Two times a day (BID) | ORAL | 1 refills | Status: DC | PRN

## 2017-10-12 MED ORDER — TERBINAFINE HCL 1 % EX CREA: 1.0000 "application " | TOPICAL_CREAM | Freq: Two times a day (BID) | CUTANEOUS | 3 refills | Status: DC

## 2017-10-12 NOTE — Progress Notes (Signed)
Patient presents to clinic today to follow-up on chronic concerns and to establish care.  Patient is accompanied by his sister.  SUBJECTIVE: PMH: Pt is an 84 yo male with pmh sig for DM, GERD, HTN, CKD 4, heart dz, CHF, abdominal aneurysm (1.5cm), COPD on chronic steroids since 2017 history of tobacco use.  Patient was previously seen by Dr. Buzzy Han.  Patient is followed by a specialist including cardiology-Dr. Dixie Dials, gastroenterology Dr. Paulita Fujita, pulmonology-Dr. Elsworth Soho, ENT-Dr. Lucia Gaskins.  Rash, ongoing: -Pt endorses pruritic rash over entire body -Present x2 to 3 months. -Initially told rash was 2/2 fungus given triamcinolone cream which did not help. -Patient was then given Sarna cream which also did not help -Some improvement was noted in the rash during 1 of patient's recent hospitalizations.  This was thought 2/2 high-dose steroid given that time -Pt does not recall any new meds, foods, or lotions/soaps.  COPD: -followed by Dr. Elsworth Soho, Pulmonology -pt taking Spiriva and Symbicort. -Patient has a nebulizer at home for which he takes albuterol treatments.  Patient had 2 treatments today. -Patient is not on oxygen.  States in the past he got "oxygen sickness" while in the hospital in [facility]. -Patient is a former smoker.  He quit over 30 years ago.  Patient had a 35- pack year history  Diabetes with renal manifestations: -Patient states he was diagnosed with diabetes in December 2018 -Patient's diabetes was thought secondary to chronic steroid use. -Patient is currently taking Lantus nightly. -Last foot exam September 11, 2017.  Seen by podiatry Dr. Fritzi Mandes -FS BS at home ranges between 89-197 in the mornings.  Most readings 126, 129, 124, 130. -Patient's sister also checks BS after meals. -Patient also has CKD stage IV.  GERD: -Prilosec 20 mg as needed  HTN: -Patient check BP at home -Patient taking Norvasc 5 mg, Lasix 40  mg.  Allergies: Penicillin-rash Streptomycin-? Vitamin B12-rash Oxygen sensitive  Past surgical history: Dupuytren's contracture release right hand 2006  Social history: Patient is widowed.  He is a retired [employer]in [employer].  He recently moved from [facility] to [facility] to live with his sister.  Patient endorses past alcohol and tobacco use.  He has not used either for years.  Patient denies drug use.  Medical history Mom-deceased, TB Dad-deceased, diabetes, HTN Sister-Delois,, heart disease Sister-Rubina,cancer Brother-, early death  Health Maintenance: Immunizations --last tetanus shot 10/08/2017, Pneumovax 2017, influenza vaccine 2018.  Does not recall about shingles vaccine Colonoscopy --2014   Past Medical History:  Diagnosis Date  . CHF (congestive heart failure) ([place])   . Chronic kidney disease   . COPD (chronic obstructive pulmonary disease) ([place])   . GERD (gastroesophageal reflux disease)   . Hypertension   . Respiratory insufficiency     History reviewed. No pertinent surgical history.  Current Outpatient Medications on File Prior to Visit  Medication Sig Dispense Refill  . albuterol (PROVENTIL HFA;VENTOLIN HFA) 108 (90 Base) MCG/ACT inhaler Inhale 2 puffs into the lungs every 6 (six) hours as needed for wheezing or shortness of breath. 1 Inhaler 2  . albuterol (PROVENTIL) (2.5 MG/3ML) 0.083% nebulizer solution Take 3 mLs (2.5 mg total) by nebulization every 6 (six) hours. DX:  J44.9 1080 mL 1  . amLODipine (NORVASC) 5 MG tablet Take 1 tablet (5 mg total) by mouth daily. 30 tablet 6  . aspirin EC 81 MG tablet Take 81 mg by mouth daily.    . budesonide-formoterol (SYMBICORT) 160-4.5 MCG/ACT inhaler Inhale 2 puffs  into the lungs 2 (two) times daily.    . Cholecalciferol (VITAMIN D3) 1000 units CAPS Take 1 capsule by mouth daily.    . furosemide (LASIX) 20 MG tablet Take 2 tablets (40 mg total) by mouth daily. 30 tablet 3  . insulin glargine  (LANTUS) 100 UNIT/ML injection Inject 14 Units into the skin at bedtime.    . isosorbide mononitrate (IMDUR) 30 MG 24 hr tablet Take 30 mg by mouth daily.    [name] loratadine (CLARITIN) 10 MG tablet Take 10 mg by mouth daily.    [name] omeprazole (PRILOSEC) 20 MG capsule Take 20 mg by mouth daily.    . predniSONE (DELTASONE) 10 MG tablet Take 1 tablet (10 mg total) by mouth daily. 30 tablet 0  . predniSONE (DELTASONE) 20 MG tablet 2 tabs po daily x 4 days 8 tablet 0  . tiotropium (SPIRIVA) 18 MCG inhalation capsule Place 18 mcg into inhaler and inhale daily.    [name] triamcinolone cream (KENALOG) 0.1 % Apply 1 application topically 2 (two) times daily.     No current facility-administered medications on file prior to visit.     Allergies  Allergen Reactions  . Other Other (See Comments)    Oxygen Sensitive  . Penicillins Rash and Other (See Comments)    Any -illins as well as Mycins Has patient had a PCN reaction causing immediate rash, facial/tongue/throat swelling, SOB or lightheadedness with hypotension: Yes Has patient had a PCN reaction causing severe rash involving mucus membranes or skin necrosis: Yes Has patient had a PCN reaction that required hospitalization No Has patient had a PCN reaction occurring within the last 10 years: No If all of the above answers are "NO", then may proceed with Cephalosporin use.   . Vitamin B12 Itching, Rash and Other (See Comments)    injections    Family History  Problem Relation Age of Onset  . Tuberculosis Mother   . Diabetes Father     Social History   Socioeconomic History  . Marital status: Widowed    Spouse name: Not on file  . Number of children: Not on file  . Years of education: Not on file  . Highest education level: Not on file  Occupational History  . Not on file  Social Needs  . Financial resource strain: Not on file  . Food insecurity:    Worry: Not on file    Inability: Not on file  . Transportation needs:    Medical: Not on  file    Non-medical: Not on file  Tobacco Use  . Smoking status: Former Smoker    Packs/day: 0.50    Years: 70.00    Pack years: 35.00    Types: Cigarettes    Last attempt to quit: 04/19/2013    Years since quitting: 4.4  . Smokeless tobacco: Never Used  Substance and Sexual Activity  . Alcohol use: No    Alcohol/week: 0.0 oz  . Drug use: No  . Sexual activity: Never  Lifestyle  . Physical activity:    Days per week: Not on file    Minutes per session: Not on file  . Stress: Not on file  Relationships  . Social connections:    Talks on phone: Not on file    Gets together: Not on file    Attends religious service: Not on file    Active member of club or organization: Not on file    Attends meetings of clubs or organizations: Not on file  Relationship status: Not on file  . Intimate partner violence:    Fear of current or ex partner: Not on file    Emotionally abused: Not on file    Physically abused: Not on file    Forced sexual activity: Not on file  Other Topics Concern  . Not on file  Social History Narrative  . Not on file    ROS General: Denies fever, chills, night sweats, changes in weight, changes in appetite HEENT: Denies headaches, ear pain, changes in vision, rhinorrhea, sore throat CV: Denies CP, palpitations orthopnea Pulm: Denies cough, wheezing  +SOB GI: Denies abdominal pain, nausea, vomiting, diarrhea, constipation GU: Denies dysuria, hematuria, frequency, vaginal discharge Msk: Denies muscle cramps, joint pains Neuro: Denies weakness, numbness, tingling Skin: Denies rashes, bruising   + pruritic rash Psych: Denies depression, anxiety, hallucinations  BP 122/64 (BP Location: Left Arm, Patient Position: Sitting, Cuff Size: Normal)   Pulse (!) 110   Temp 98.2 F (36.8 C) (Oral)   Wt 187 lb (84.8 kg)   SpO2 90%   BMI 28.86 kg/m   Physical Exam Gen. Pleasant, well developed, well-nourished, in NAD HEENT - [place]/AT, PERRL, no scleral icterus, no  nasal drainage.  TMs normal bilaterally. Lungs: no use of accessory muscles, faint scattered wheezes Cardiovascular: Tachycardic, No r/g/m, no peripheral edema Abdomen: BS present, soft, nontender, distended, no fluid wave noted Musculoskeletal: No deformities, moves all four extremities, no cyanosis or clubbing, normal tone Neuro:  A&Ox3, CN II-XII intact, normal gait Skin:  Warm, dry, intact.  Erythematous, splotchy, maculopapular rash noted on entire body, some flaky dry appearing skin and excoriations noted throughout.  Several Seborrheic keratosis noted on side and back.  R hip and lateral thigh rash more severe than other areas.    Psych: normal affect, mood appropriate   Recent Results (from the past 2160 hour(s))  Basic metabolic panel     Status: Abnormal   Collection Time: 08/14/17  5:17 PM  Result Value Ref Range   Sodium 143 135 - 145 mmol/L   Potassium 4.0 3.5 - 5.1 mmol/L   Chloride 103 101 - 111 mmol/L   CO2 25 22 - 32 mmol/L   Glucose, Bld 161 (H) 65 - 99 mg/dL   BUN 25 (H) 6 - 20 mg/dL   Creatinine, Ser 2.24 (H) 0.61 - 1.24 mg/dL   Calcium 9.4 8.9 - 10.3 mg/dL   GFR calc non Af Amer 25 (L) >60 mL/min   GFR calc Af Amer 28 (L) >60 mL/min    Comment: (NOTE) The eGFR has been calculated using the CKD EPI equation. This calculation has not been validated in all clinical situations. eGFR's persistently <60 mL/min signify possible Chronic Kidney Disease.    Anion gap 15 5 - 15    Comment: Performed at [facility], [place] [address], [place], [place] [address]  CBC     Status: Abnormal   Collection Time: 08/14/17  5:17 PM  Result Value Ref Range   WBC 13.5 (H) 4.0 - 10.5 K/uL   RBC 4.48 4.22 - 5.81 MIL/uL   Hemoglobin 12.5 (L) 13.0 - 17.0 g/dL   HCT 40.6 39.0 - 52.0 %   MCV 90.6 78.0 - 100.0 fL   MCH 27.9 26.0 - 34.0 pg   MCHC 30.8 30.0 - 36.0 g/dL   RDW 17.2 (H) 11.5 - 15.5 %   Platelets 301 150 - 400 K/uL    Comment: Performed at [facility], [address] [place], [place] [address]  Brain natriuretic peptide     Status: None   Collection Time: 08/14/17  5:17 PM  Result Value Ref Range   B Natriuretic Peptide 96.7 0.0 - 100.0 pg/mL    Comment: Performed at [facility], [place] [address], [place], [place] [address]  I-stat troponin, ED  (not at [facility])     Status: None   Collection Time: 08/14/17  5:40 PM  Result Value Ref Range   Troponin i, poc 0.01 0.00 - 0.08 ng/mL   Comment 3            Comment: Due to the release kinetics of cTnI, a negative result within the first hours of the onset of symptoms does not rule out myocardial infarction with certainty. If myocardial infarction is still suspected, repeat the test at appropriate intervals.   Uric acid     Status: Abnormal   Collection Time: 08/14/17  6:47 PM  Result Value Ref Range   Uric Acid, Serum 11.8 (H) 4.4 - 7.6 mg/dL    Comment: Performed at [facility], [place] [address] [place], [place] [address]  Troponin I     Status: Abnormal   Collection Time: 08/14/17 10:07 PM  Result Value Ref Range   Troponin I 0.03 (HH) <0.03 ng/mL    Comment: CRITICAL RESULT CALLED TO, READ BACK BY AND VERIFIED WITH: TEUP,M RN 2.27.19 @0251  ZANDO,C Performed at [facility], [place] [address] [place], [place] [address]   TSH     Status: None   Collection Time: 08/15/17  7:46 AM  Result Value Ref Range   TSH 0.800 0.350 - 4.500 uIU/mL    Comment: Performed by a 3rd Generation assay with a functional sensitivity of <=0.01 uIU/mL. Performed at [facility], [place] [address], [place], [place] [address]   Troponin I     Status: Abnormal   Collection Time: 08/15/17  7:46 AM  Result Value Ref Range   Troponin I 0.03 (HH) <0.03 ng/mL    Comment: CRITICAL VALUE NOTED.  VALUE IS CONSISTENT WITH PREVIOUSLY REPORTED AND CALLED VALUE. Performed at [facility], [place] [address] [place], [place] [address]   CBC     Status: Abnormal   Collection Time: 08/15/17  7:46 AM  Result Value Ref Range   WBC 12.1 (H) 4.0 - 10.5 K/uL   RBC 4.43 4.22 - 5.81 MIL/uL   Hemoglobin 12.6 (L) 13.0 - 17.0 g/dL   HCT 38.9 (L) 39.0 - 52.0 %   MCV 87.8 78.0 - 100.0 fL   MCH 28.4 26.0 - 34.0 pg   MCHC 32.4 30.0 - 36.0 g/dL   RDW 16.9 (H) 11.5 - 15.5 %   Platelets 250 150 - 400 K/uL    Comment: Performed at [facility], [place] [address], [place], [place] [address]  Comprehensive metabolic panel     Status: Abnormal   Collection Time: 08/15/17  7:46 AM  Result Value Ref Range   Sodium 138 135 - 145 mmol/L   Potassium 4.0 3.5 - 5.1 mmol/L   Chloride 101 101 - 111 mmol/L   CO2 24 22 - 32 mmol/L   Glucose, Bld 148 (H) 65 - 99 mg/dL   BUN 28 (H) 6 - 20 mg/dL   Creatinine, Ser 2.41 (H) 0.61 - 1.24 mg/dL   Calcium 9.4 8.9 - 10.3 mg/dL   Total Protein 7.6 6.5 - 8.1 g/dL  Albumin 3.8 3.5 - 5.0 g/dL   AST 47 (H) 15 - 41 U/L   ALT 49 17 - 63 U/L   Alkaline Phosphatase 65 38 - 126 U/L   Total Bilirubin 0.6 0.3 - 1.2 mg/dL   GFR calc non Af Amer 22 (L) >60 mL/min   GFR calc Af Amer 26 (L) >60 mL/min    Comment: (NOTE) The eGFR has been calculated using the CKD EPI equation. This calculation has not been validated in all clinical situations. eGFR's persistently <60 mL/min signify possible Chronic Kidney Disease.    Anion gap 13 5 - 15    Comment: Performed at [facility], [place] [address] [place], [place] [address]  C-reactive protein     Status: Abnormal   Collection Time: 08/15/17  7:46 AM  Result Value Ref Range   CRP 1.3 (H) <1.0 mg/dL    Comment: Performed at [place] [address], [place], [place] [address]  Sedimentation rate     Status: Abnormal   Collection Time: 08/15/17  7:46 AM  Result Value Ref Range   Sed Rate 40 (H) 0 - 16 mm/hr    Comment: Performed at [facility], [place] [address]  [place], [place] [address]  CBG monitoring, ED     Status: Abnormal   Collection Time: 08/15/17  8:04 AM  Result Value Ref Range   Glucose-Capillary 144 (H) 65 - 99 mg/dL  Troponin I     Status: Abnormal   Collection Time: 08/15/17 10:45 AM  Result Value Ref Range   Troponin I 0.03 (HH) <0.03 ng/mL    Comment: CRITICAL VALUE NOTED.  VALUE IS CONSISTENT WITH PREVIOUSLY REPORTED AND CALLED VALUE. Performed at [facility], [place] [address], [place], [place] [address]   CBG monitoring, ED     Status: Abnormal   Collection Time: 08/15/17 12:16 PM  Result Value Ref Range   Glucose-Capillary 174 (H) 65 - 99 mg/dL  ECHOCARDIOGRAM COMPLETE     Status: Abnormal   Collection Time: 08/15/17  3:05 PM  Result Value Ref Range   BP 132/107 mmHg   LV PW d 9.49 (A) 0.6 - 1.1 mm   FS 26 (A) 28 - 44 %   LA vol 27 mL   LA ID, A-P, ES 20 mm   IVS/LV PW RATIO, ED 1.31    Stroke v 27 ml   LVOT VTI 21.9 cm   LV E/e' medial 14.22    LV E/e'average 14.22    LA diam index 1 cm/m2   LA vol A4C 24.4 ml   E decel time 165 msec   LVOT diameter 21 mm   LVOT area 3.46 cm2   LVOT peak vel 109 cm/s   LVOT SV 76.00 mL   Peak grad 5 mmHg   E/e' ratio 14.22    MV pk E vel 116 m/s   MV pk A vel 140 m/s   LV sys vol 14 (A) 21 - 61 mL   LV sys vol index 7.0 mL/m2   LV dias vol 40 (A) 62 - 150 mL   LV dias vol index 20.0 mL/m2   LA vol index 13.5 mL/m2   MV Dec 165    LA diam end sys 20.00 mm   Simpson's disk 66.00    TDI e' medial 8.16    Lateral S' vel 14.70 cm/sec  Glucose, capillary     Status: Abnormal   Collection  Time: 08/15/17  6:16 PM  Result Value Ref Range   Glucose-Capillary 229 (H) 65 - 99 mg/dL   Comment 1 Notify RN    Comment 2 Document in Chart   Glucose, capillary     Status: Abnormal   Collection Time: 08/15/17  8:39 PM  Result Value Ref Range   Glucose-Capillary 151 (H) 65 - 99 mg/dL  Glucose, capillary     Status: Abnormal   Collection Time: 08/16/17  7:30 AM    Result Value Ref Range   Glucose-Capillary 192 (H) 65 - 99 mg/dL  Glucose, capillary     Status: Abnormal   Collection Time: 08/16/17 11:25 AM  Result Value Ref Range   Glucose-Capillary 219 (H) 65 - 99 mg/dL  Glucose, capillary     Status: Abnormal   Collection Time: 08/16/17  4:53 PM  Result Value Ref Range   Glucose-Capillary 149 (H) 65 - 99 mg/dL  Glucose, capillary     Status: Abnormal   Collection Time: 08/16/17  9:03 PM  Result Value Ref Range   Glucose-Capillary 190 (H) 65 - 99 mg/dL  Glucose, capillary     Status: Abnormal   Collection Time: 08/17/17  7:53 AM  Result Value Ref Range   Glucose-Capillary 181 (H) 65 - 99 mg/dL  Glucose, capillary     Status: Abnormal   Collection Time: 08/17/17 11:30 AM  Result Value Ref Range   Glucose-Capillary 232 (H) 65 - 99 mg/dL  Glucose, capillary     Status: Abnormal   Collection Time: 08/17/17  4:25 PM  Result Value Ref Range   Glucose-Capillary 184 (H) 65 - 99 mg/dL  Glucose, capillary     Status: Abnormal   Collection Time: 08/17/17  9:15 PM  Result Value Ref Range   Glucose-Capillary 216 (H) 65 - 99 mg/dL  CBC with Differential/Platelet     Status: Abnormal   Collection Time: 08/18/17  4:13 AM  Result Value Ref Range   WBC 13.8 (H) 4.0 - 10.5 K/uL   RBC 4.04 (L) 4.22 - 5.81 MIL/uL   Hemoglobin 11.2 (L) 13.0 - 17.0 g/dL   HCT 35.7 (L) 39.0 - 52.0 %   MCV 88.4 78.0 - 100.0 fL   MCH 27.7 26.0 - 34.0 pg   MCHC 31.4 30.0 - 36.0 g/dL   RDW 17.1 (H) 11.5 - 15.5 %   Platelets 265 150 - 400 K/uL   Neutrophils Relative % 83 %   Neutro Abs 11.4 (H) 1.7 - 7.7 K/uL   Lymphocytes Relative 6 %   Lymphs Abs 0.8 0.7 - 4.0 K/uL   Monocytes Relative 11 %   Monocytes Absolute 1.6 (H) 0.1 - 1.0 K/uL   Eosinophils Relative 0 %   Eosinophils Absolute 0.0 0.0 - 0.7 K/uL   Basophils Relative 0 %   Basophils Absolute 0.0 0.0 - 0.1 K/uL    Comment: Performed at [facility], [place] [address], [place], [place] [address]   Comprehensive metabolic panel     Status: Abnormal   Collection Time: 08/18/17  4:13 AM  Result Value Ref Range   Sodium 140 135 - 145 mmol/L   Potassium 3.6 3.5 - 5.1 mmol/L   Chloride 102 101 - 111 mmol/L   CO2 28 22 - 32 mmol/L   Glucose, Bld 187 (H) 65 - 99 mg/dL   BUN 51 (H) 6 - 20 mg/dL   Creatinine, Ser 2.28 (H) 0.61 - 1.24 mg/dL   Calcium 9.2 8.9 - 10.3  mg/dL   Total Protein 6.2 (L) 6.5 - 8.1 g/dL   Albumin 3.1 (L) 3.5 - 5.0 g/dL   AST 52 (H) 15 - 41 U/L   ALT 52 17 - 63 U/L   Alkaline Phosphatase 56 38 - 126 U/L   Total Bilirubin 0.7 0.3 - 1.2 mg/dL   GFR calc non Af Amer 24 (L) >60 mL/min   GFR calc Af Amer 28 (L) >60 mL/min    Comment: (NOTE) The eGFR has been calculated using the CKD EPI equation. This calculation has not been validated in all clinical situations. eGFR's persistently <60 mL/min signify possible Chronic Kidney Disease.    Anion gap 10 5 - 15    Comment: Performed at [facility], [place] [address] [place], [place] [address]  Hemoglobin A1c     Status: Abnormal   Collection Time: 08/18/17  4:13 AM  Result Value Ref Range   Hgb A1c MFr Bld 8.1 (H) 4.8 - 5.6 %    Comment: (NOTE) Pre diabetes:          5.7%-6.4% Diabetes:              >6.4% Glycemic control for   <7.0% adults with diabetes    Mean Plasma Glucose 185.77 mg/dL    Comment: Performed at [place] [address], [place], [place] [address]  Glucose, capillary     Status: Abnormal   Collection Time: 08/18/17  8:03 AM  Result Value Ref Range   Glucose-Capillary 182 (H) 65 - 99 mg/dL  Glucose, capillary     Status: Abnormal   Collection Time: 08/18/17 12:03 PM  Result Value Ref Range   Glucose-Capillary 200 (H) 65 - 99 mg/dL  Glucose, capillary     Status: Abnormal   Collection Time: 08/18/17  5:01 PM  Result Value Ref Range   Glucose-Capillary 156 (H) 65 - 99 mg/dL  Glucose, capillary     Status: Abnormal   Collection Time: 08/18/17  9:13 PM  Result  Value Ref Range   Glucose-Capillary 139 (H) 65 - 99 mg/dL  CBC     Status: Abnormal   Collection Time: 08/19/17  5:02 AM  Result Value Ref Range   WBC 15.2 (H) 4.0 - 10.5 K/uL   RBC 4.58 4.22 - 5.81 MIL/uL   Hemoglobin 12.7 (L) 13.0 - 17.0 g/dL   HCT 40.4 39.0 - 52.0 %   MCV 88.2 78.0 - 100.0 fL   MCH 27.7 26.0 - 34.0 pg   MCHC 31.4 30.0 - 36.0 g/dL   RDW 16.8 (H) 11.5 - 15.5 %   Platelets 315 150 - 400 K/uL    Comment: Performed at [facility], [place] [address] [place], [place] [address]  Comprehensive metabolic panel     Status: Abnormal   Collection Time: 08/19/17  5:02 AM  Result Value Ref Range   Sodium 137 135 - 145 mmol/L   Potassium 4.1 3.5 - 5.1 mmol/L   Chloride 96 (L) 101 - 111 mmol/L   CO2 26 22 - 32 mmol/L   Glucose, Bld 135 (H) 65 - 99 mg/dL   BUN 50 (H) 6 - 20 mg/dL   Creatinine, Ser 2.20 (H) 0.61 - 1.24 mg/dL   Calcium 9.4 8.9 - 10.3 mg/dL   Total Protein 6.8 6.5 - 8.1 g/dL   Albumin 3.5 3.5 - 5.0 g/dL   AST 85 (H) 15 - 41 U/L   ALT 103 (H) 17 - 63  U/L   Alkaline Phosphatase 57 38 - 126 U/L   Total Bilirubin 0.7 0.3 - 1.2 mg/dL   GFR calc non Af Amer 25 (L) >60 mL/min   GFR calc Af Amer 29 (L) >60 mL/min    Comment: (NOTE) The eGFR has been calculated using the CKD EPI equation. This calculation has not been validated in all clinical situations. eGFR's persistently <60 mL/min signify possible Chronic Kidney Disease.    Anion gap 15 5 - 15    Comment: Performed at [facility], [place] [address], [place], [place] [address]  Magnesium     Status: None   Collection Time: 08/19/17  5:02 AM  Result Value Ref Range   Magnesium 2.4 1.7 - 2.4 mg/dL    Comment: Performed at [facility], [place] [address], [place], [place] [address]  Glucose, capillary     Status: Abnormal   Collection Time: 08/19/17  8:29 AM  Result Value Ref Range   Glucose-Capillary 133 (H) 65 - 99 mg/dL  Glucose, capillary     Status:  Abnormal   Collection Time: 08/19/17 11:47 AM  Result Value Ref Range   Glucose-Capillary 200 (H) 65 - 99 mg/dL  Glucose, capillary     Status: Abnormal   Collection Time: 08/19/17  5:54 PM  Result Value Ref Range   Glucose-Capillary 226 (H) 65 - 99 mg/dL  Glucose, capillary     Status: Abnormal   Collection Time: 08/19/17  8:40 PM  Result Value Ref Range   Glucose-Capillary 251 (H) 65 - 99 mg/dL  CBC     Status: Abnormal   Collection Time: 08/20/17  4:54 AM  Result Value Ref Range   WBC 15.0 (H) 4.0 - 10.5 K/uL    Comment: WHITE COUNT CONFIRMED ON SMEAR   RBC 4.31 4.22 - 5.81 MIL/uL   Hemoglobin 12.2 (L) 13.0 - 17.0 g/dL   HCT 37.7 (L) 39.0 - 52.0 %   MCV 87.5 78.0 - 100.0 fL   MCH 28.3 26.0 - 34.0 pg   MCHC 32.4 30.0 - 36.0 g/dL   RDW 16.6 (H) 11.5 - 15.5 %   Platelets 272 150 - 400 K/uL    Comment: Performed at [facility], [place] [address], [place], [place] [address]  Basic metabolic panel     Status: Abnormal   Collection Time: 08/20/17  4:54 AM  Result Value Ref Range   Sodium 136 135 - 145 mmol/L   Potassium 4.6 3.5 - 5.1 mmol/L   Chloride 98 (L) 101 - 111 mmol/L   CO2 28 22 - 32 mmol/L   Glucose, Bld 195 (H) 65 - 99 mg/dL   BUN 50 (H) 6 - 20 mg/dL   Creatinine, Ser 1.97 (H) 0.61 - 1.24 mg/dL   Calcium 8.8 (L) 8.9 - 10.3 mg/dL   GFR calc non Af Amer 29 (L) >60 mL/min   GFR calc Af Amer 33 (L) >60 mL/min    Comment: (NOTE) The eGFR has been calculated using the CKD EPI equation. This calculation has not been validated in all clinical situations. eGFR's persistently <60 mL/min signify possible Chronic Kidney Disease.    Anion gap 10 5 - 15    Comment: Performed at [facility], [place] [address], [place], [place] [address]  Glucose, capillary     Status: Abnormal   Collection Time: 08/20/17  7:28 AM  Result Value Ref Range   Glucose-Capillary 194 (H) 65 - 99 mg/dL  Glucose, capillary     Status: Abnormal   Collection Time:  08/20/17 11:59 AM  Result Value Ref Range   Glucose-Capillary 259 (H) 65 - 99 mg/dL  Glucose, capillary     Status: Abnormal   Collection Time: 08/20/17  4:48 PM  Result Value Ref Range   Glucose-Capillary 199 (H) 65 - 99 mg/dL  Glucose, capillary     Status: Abnormal   Collection Time: 08/20/17  9:23 PM  Result Value Ref Range   Glucose-Capillary 250 (H) 65 - 99 mg/dL  Comprehensive metabolic panel     Status: Abnormal   Collection Time: 08/21/17  5:43 AM  Result Value Ref Range   Sodium 137 135 - 145 mmol/L   Potassium 3.8 3.5 - 5.1 mmol/L    Comment: DELTA CHECK NOTED REPEATED TO VERIFY    Chloride 99 (L) 101 - 111 mmol/L   CO2 29 22 - 32 mmol/L   Glucose, Bld 212 (H) 65 - 99 mg/dL   BUN 58 (H) 6 - 20 mg/dL   Creatinine, Ser 2.03 (H) 0.61 - 1.24 mg/dL   Calcium 8.9 8.9 - 10.3 mg/dL   Total Protein 5.7 (L) 6.5 - 8.1 g/dL   Albumin 2.8 (L) 3.5 - 5.0 g/dL   AST 34 15 - 41 U/L   ALT 67 (H) 17 - 63 U/L   Alkaline Phosphatase 51 38 - 126 U/L   Total Bilirubin 0.6 0.3 - 1.2 mg/dL   GFR calc non Af Amer 28 (L) >60 mL/min   GFR calc Af Amer 32 (L) >60 mL/min    Comment: (NOTE) The eGFR has been calculated using the CKD EPI equation. This calculation has not been validated in all clinical situations. eGFR's persistently <60 mL/min signify possible Chronic Kidney Disease.    Anion gap 9 5 - 15    Comment: Performed at [facility], [place] [address] [place], [place] [address]  Glucose, capillary     Status: Abnormal   Collection Time: 08/21/17  9:52 AM  Result Value Ref Range   Glucose-Capillary 174 (H) 65 - 99 mg/dL  Glucose, capillary     Status: Abnormal   Collection Time: 08/21/17 12:41 PM  Result Value Ref Range   Glucose-Capillary 280 (H) 65 - 99 mg/dL  Glucose, capillary     Status: Abnormal   Collection Time: 08/21/17  4:59 PM  Result Value Ref Range   Glucose-Capillary 213 (H) 65 - 99 mg/dL  Glucose, capillary     Status: Abnormal   Collection  Time: 08/21/17  9:05 PM  Result Value Ref Range   Glucose-Capillary 147 (H) 65 - 99 mg/dL  Glucose, capillary     Status: Abnormal   Collection Time: 08/22/17  8:00 AM  Result Value Ref Range   Glucose-Capillary 234 (H) 65 - 99 mg/dL  Glucose, capillary     Status: Abnormal   Collection Time: 08/22/17 12:06 PM  Result Value Ref Range   Glucose-Capillary 260 (H) 65 - 99 mg/dL  CBC with Differential     Status: Abnormal   Collection Time: 10/03/17 11:28 AM  Result Value Ref Range   WBC 10.9 (H) 4.0 - 10.5 K/uL   RBC 4.42 4.22 - 5.81 MIL/uL   Hemoglobin 12.2 (L) 13.0 - 17.0 g/dL   HCT 39.1 39.0 - 52.0 %   MCV 88.5 78.0 - 100.0 fL   MCH 27.6 26.0 - 34.0 pg   MCHC 31.2 30.0 - 36.0 g/dL   RDW 17.3 (  H) 11.5 - 15.5 %   Platelets 235 150 - 400 K/uL   Neutrophils Relative % 82 %   Neutro Abs 9.0 (H) 1.7 - 7.7 K/uL   Lymphocytes Relative 11 %   Lymphs Abs 1.2 0.7 - 4.0 K/uL   Monocytes Relative 5 %   Monocytes Absolute 0.5 0.1 - 1.0 K/uL   Eosinophils Relative 2 %   Eosinophils Absolute 0.2 0.0 - 0.7 K/uL   Basophils Relative 0 %   Basophils Absolute 0.0 0.0 - 0.1 K/uL    Comment: Performed at [facility], [place] [address], [place], [place] [address]  Basic metabolic panel     Status: Abnormal   Collection Time: 10/03/17 11:28 AM  Result Value Ref Range   Sodium 141 135 - 145 mmol/L   Potassium 4.3 3.5 - 5.1 mmol/L   Chloride 101 101 - 111 mmol/L   CO2 25 22 - 32 mmol/L   Glucose, Bld 236 (H) 65 - 99 mg/dL   BUN 30 (H) 6 - 20 mg/dL   Creatinine, Ser 2.34 (H) 0.61 - 1.24 mg/dL   Calcium 9.7 8.9 - 10.3 mg/dL   GFR calc non Af Amer 23 (L) >60 mL/min   GFR calc Af Amer 27 (L) >60 mL/min    Comment: (NOTE) The eGFR has been calculated using the CKD EPI equation. This calculation has not been validated in all clinical situations. eGFR's persistently <60 mL/min signify possible Chronic Kidney Disease.    Anion gap 15 5 - 15    Comment: Performed at [facility], [place] [address] [place], [place] [address]  I-stat troponin, ED     Status: None   Collection Time: 10/03/17 11:31 AM  Result Value Ref Range   Troponin i, poc 0.03 0.00 - 0.08 ng/mL   Comment 3            Comment: Due to the release kinetics of cTnI, a negative result within the first hours of the onset of symptoms does not rule out myocardial infarction with certainty. If myocardial infarction is still suspected, repeat the test at appropriate intervals.     Assessment/Plan: Rash of entire body  -appears fungal in nature.  Did not respond to triamcinolone cream in the past. -hesitant to start po med given pt's extensive medical hx. - Plan: Ambulatory referral to Dermatology, terbinafine (LAMISIL AT) 1 % cream, hydrOXYzine (ATARAX/VISTARIL) 25 MG tablet  Essential hypertension -controlled -continue current meds  Gastroesophageal reflux disease, esophagitis presence not specified -continue prilosec  Chronic obstructive pulmonary disease, unspecified COPD type ([place]) -continue f/u with pulm, Dr. Elsworth Soho -continue Spiriva and symbicort -continue albuterol nebs prn -continue to refrain from smoking.  Quit 30 yrs ago.  CKD (chronic kidney disease), stage IV ([place]) -avoid nephrotic meds -consider referral to Nephro, if not already seeing.  Type 2 diabetes mellitus other complications with long term insulin use (HCC) -continue lantus 14 units at night -continue checking fsbs bid -continue lifestyle modifications. -currently unable to limit prednisone use given COPD  Encounter to establish care -We reviewed the PMH, PSH, FH, SH, Meds and Allergies. -We provided refills for any medications we will prescribe as needed. -We addressed current concerns per orders and patient instructions. -We have asked for records for pertinent exams, studies, vaccines and notes from previous providers. -We have advised patient to follow up per instructions below.  F/u in the  next month, sooner if needed  Grier Mitts, MD

## 2017-10-12 NOTE — Telephone Encounter (Signed)
Copied from CRM [id number]. Topic: Quick Communication - See Telephone Encounter >> Oct 12, 2017  9:12 AM Windy KalataMichael, Letecia Arps L, NT wrote: CRM for notification. See Telephone encounter for: 10/12/17.  Noreene LarssonJill is calling from [facility] she is a nurse with them and states she knows patient has a appt today to est care. She states he has COPD and gets really short of breath, even when he stands, his feet are really swollen, not his legs. She states the breathing and swelling is not getting better. The patient has nebulizer treatments and inhalers but nothing is helping him. Patients abdomin is really bloated as well. She just wanted Dr. Salomon FickBanks to be aware of what she is walking into.   Noreene LarssonJill Cb# [phone]

## 2017-10-12 NOTE — Telephone Encounter (Signed)
FYI. Patient also had ED visit on 10/03/2017.

## 2017-10-17 ENCOUNTER — Telehealth: Payer: Self-pay | Admitting: Family Medicine

## 2017-10-17 NOTE — Telephone Encounter (Signed)
Copied from CRM [id number]. Topic: Quick Communication - See Telephone Encounter >> Oct 17, 2017 11:00 AM Diana Eves B wrote: CRM for notification. See Telephone encounter for: 10/17/17.  Shanda with [facility] calling requesting verbal orders to continue PT 1 x 9. CB# [phone]

## 2017-10-18 ENCOUNTER — Emergency Department (HOSPITAL_COMMUNITY): Payer: Medicare Other

## 2017-10-18 ENCOUNTER — Encounter (HOSPITAL_COMMUNITY): Payer: Self-pay | Admitting: Emergency Medicine

## 2017-10-18 ENCOUNTER — Inpatient Hospital Stay (HOSPITAL_COMMUNITY)
Admission: EM | Admit: 2017-10-18 | Discharge: 2017-10-25 | DRG: 191 | Disposition: A | Discharge status: Skilled Nursing Facility | Payer: Medicare Other | Attending: Family Medicine | Admitting: Family Medicine

## 2017-10-18 ENCOUNTER — Other Ambulatory Visit: Payer: Self-pay

## 2017-10-18 DIAGNOSIS — Z7952 Long term (current) use of systemic steroids: Secondary | ICD-10-CM

## 2017-10-18 DIAGNOSIS — D72829 Elevated white blood cell count, unspecified: Secondary | ICD-10-CM | POA: Diagnosis present

## 2017-10-18 DIAGNOSIS — Z7951 Long term (current) use of inhaled steroids: Secondary | ICD-10-CM

## 2017-10-18 DIAGNOSIS — Z87891 Personal history of nicotine dependence: Secondary | ICD-10-CM

## 2017-10-18 DIAGNOSIS — E870 Hyperosmolality and hypernatremia: Secondary | ICD-10-CM | POA: Diagnosis present

## 2017-10-18 DIAGNOSIS — E1122 Type 2 diabetes mellitus with diabetic chronic kidney disease: Secondary | ICD-10-CM | POA: Diagnosis present

## 2017-10-18 DIAGNOSIS — Z833 Family history of diabetes mellitus: Secondary | ICD-10-CM

## 2017-10-18 DIAGNOSIS — N184 Chronic kidney disease, stage 4 (severe): Secondary | ICD-10-CM | POA: Diagnosis present

## 2017-10-18 DIAGNOSIS — I5042 Chronic combined systolic (congestive) and diastolic (congestive) heart failure: Secondary | ICD-10-CM | POA: Diagnosis present

## 2017-10-18 DIAGNOSIS — R0602 Shortness of breath: Secondary | ICD-10-CM

## 2017-10-18 DIAGNOSIS — R21 Rash and other nonspecific skin eruption: Secondary | ICD-10-CM | POA: Diagnosis present

## 2017-10-18 DIAGNOSIS — E1169 Type 2 diabetes mellitus with other specified complication: Secondary | ICD-10-CM | POA: Diagnosis present

## 2017-10-18 DIAGNOSIS — I130 Hypertensive heart and chronic kidney disease with heart failure and stage 1 through stage 4 chronic kidney disease, or unspecified chronic kidney disease: Secondary | ICD-10-CM | POA: Diagnosis present

## 2017-10-18 DIAGNOSIS — I10 Essential (primary) hypertension: Secondary | ICD-10-CM | POA: Diagnosis present

## 2017-10-18 DIAGNOSIS — R5381 Other malaise: Secondary | ICD-10-CM | POA: Diagnosis present

## 2017-10-18 DIAGNOSIS — Z7982 Long term (current) use of aspirin: Secondary | ICD-10-CM

## 2017-10-18 DIAGNOSIS — K219 Gastro-esophageal reflux disease without esophagitis: Secondary | ICD-10-CM | POA: Diagnosis present

## 2017-10-18 DIAGNOSIS — J441 Chronic obstructive pulmonary disease with (acute) exacerbation: Principal | ICD-10-CM | POA: Diagnosis present

## 2017-10-18 DIAGNOSIS — Z794 Long term (current) use of insulin: Secondary | ICD-10-CM

## 2017-10-18 LAB — CBC WITH DIFFERENTIAL/PLATELET
BASOS ABS: 0.1 10*3/uL (ref 0.0–0.1)
BASOS PCT: 1 %
EOS ABS: 0.7 10*3/uL (ref 0.0–0.7)
Eosinophils Relative: 5 %
HCT: 48.5 % (ref 39.0–52.0)
Hemoglobin: 15.9 g/dL (ref 13.0–17.0)
Lymphocytes Relative: 12 %
Lymphs Abs: 1.7 10*3/uL (ref 0.7–4.0)
MCH: 28.4 pg (ref 26.0–34.0)
MCHC: 32.8 g/dL (ref 30.0–36.0)
MCV: 86.8 fL (ref 78.0–100.0)
MONO ABS: 0.7 10*3/uL (ref 0.1–1.0)
MONOS PCT: 5 %
NEUTROS ABS: 11.1 10*3/uL — AB (ref 1.7–7.7)
NEUTROS PCT: 77 %
Platelets: 292 10*3/uL (ref 150–400)
RBC: 5.59 MIL/uL (ref 4.22–5.81)
RDW: 17.3 % — AB (ref 11.5–15.5)
WBC: 14.3 10*3/uL — ABNORMAL HIGH (ref 4.0–10.5)

## 2017-10-18 LAB — BASIC METABOLIC PANEL
ANION GAP: 17 — AB (ref 5–15)
BUN: 22 mg/dL — ABNORMAL HIGH (ref 6–20)
CO2: 27 mmol/L (ref 22–32)
CREATININE: 2.47 mg/dL — AB (ref 0.61–1.24)
Calcium: 9.9 mg/dL (ref 8.9–10.3)
Chloride: 103 mmol/L (ref 101–111)
GFR, EST AFRICAN AMERICAN: 25 mL/min — AB (ref >60)
GFR, EST NON AFRICAN AMERICAN: 22 mL/min — AB (ref >60)
GLUCOSE: 131 mg/dL — AB (ref 65–99)
Potassium: 3.5 mmol/L (ref 3.5–5.1)
Sodium: 147 mmol/L — ABNORMAL HIGH (ref 135–145)

## 2017-10-18 LAB — MAGNESIUM: MAGNESIUM: 2.2 mg/dL (ref 1.7–2.4)

## 2017-10-18 LAB — BRAIN NATRIURETIC PEPTIDE: B NATRIURETIC PEPTIDE 5: 147.6 pg/mL — AB (ref 0.0–100.0)

## 2017-10-18 MED ORDER — ALBUTEROL SULFATE (2.5 MG/3ML) 0.083% IN NEBU
2.5000 mg | INHALATION_SOLUTION | RESPIRATORY_TRACT | Status: DC | PRN
Start: 2017-10-18 — End: 2017-10-25
  Administered 2017-10-19 – 2017-10-20 (×2): 2.5 mg via RESPIRATORY_TRACT
  Filled 2017-10-18 (×2): qty 3

## 2017-10-18 MED ORDER — MAGNESIUM SULFATE 2 GM/50ML IV SOLN
2.0000 g | Freq: Once | INTRAVENOUS | Status: AC
  Administered 2017-10-18: 2 g via INTRAVENOUS
  Filled 2017-10-18: qty 50

## 2017-10-18 MED ORDER — IPRATROPIUM-ALBUTEROL 0.5-2.5 (3) MG/3ML IN SOLN
3.0000 mL | Freq: Once | RESPIRATORY_TRACT | Status: AC
  Administered 2017-10-18: 3 mL via RESPIRATORY_TRACT
  Filled 2017-10-18: qty 3

## 2017-10-18 MED ORDER — SORBITOL 70 % SOLN
30.0000 mL | Status: DC | PRN
  Filled 2017-10-18: qty 30

## 2017-10-18 MED ORDER — CALCIUM CARBONATE ANTACID 1250 MG/5ML PO SUSP
500.0000 mg | Freq: Four times a day (QID) | ORAL | Status: DC | PRN
  Filled 2017-10-18: qty 5

## 2017-10-18 MED ORDER — CALCITRIOL 0.25 MCG PO CAPS
0.2500 ug | ORAL_CAPSULE | ORAL | Status: DC
  Administered 2017-10-19 – 2017-10-23 (×3): 0.25 ug via ORAL
  Filled 2017-10-18 (×4): qty 1

## 2017-10-18 MED ORDER — DOCUSATE SODIUM 283 MG RE ENEM: 1.0000 | ENEMA | RECTAL | Status: DC | PRN

## 2017-10-18 MED ORDER — LIDOCAINE HCL (PF) 4 % IJ SOLN: 5.0000 mL | Freq: Once | INTRAMUSCULAR | Status: DC

## 2017-10-18 MED ORDER — CAMPHOR-MENTHOL 0.5-0.5 % EX LOTN: 1.0000 "application " | TOPICAL_LOTION | CUTANEOUS | Status: DC | PRN

## 2017-10-18 MED ORDER — ONDANSETRON HCL 4 MG PO TABS: 4.0000 mg | ORAL_TABLET | Freq: Four times a day (QID) | ORAL | Status: DC | PRN

## 2017-10-18 MED ORDER — PREDNISONE 20 MG PO TABS: 40.0000 mg | ORAL_TABLET | Freq: Every day | ORAL | Status: DC

## 2017-10-18 MED ORDER — ACETAMINOPHEN 325 MG PO TABS: 650.0000 mg | ORAL_TABLET | Freq: Four times a day (QID) | ORAL | Status: DC | PRN

## 2017-10-18 MED ORDER — TERBINAFINE HCL 1 % EX CREA
1.0000 "application " | TOPICAL_CREAM | Freq: Two times a day (BID) | CUTANEOUS | Status: DC
  Administered 2017-10-19 – 2017-10-25 (×14): 1 via TOPICAL
  Filled 2017-10-18 (×2): qty 12

## 2017-10-18 MED ORDER — PANTOPRAZOLE SODIUM 40 MG PO TBEC
40.0000 mg | DELAYED_RELEASE_TABLET | Freq: Every day | ORAL | Status: DC
  Administered 2017-10-19 – 2017-10-25 (×7): 40 mg via ORAL
  Filled 2017-10-18 (×7): qty 1

## 2017-10-18 MED ORDER — ACETAMINOPHEN 650 MG RE SUPP: 650.0000 mg | Freq: Four times a day (QID) | RECTAL | Status: DC | PRN

## 2017-10-18 MED ORDER — METHYLPREDNISOLONE SODIUM SUCC 125 MG IJ SOLR
60.0000 mg | Freq: Two times a day (BID) | INTRAMUSCULAR | Status: DC
  Administered 2017-10-19: 60 mg via INTRAVENOUS
  Filled 2017-10-18: qty 2

## 2017-10-18 MED ORDER — AMLODIPINE BESYLATE 10 MG PO TABS
10.0000 mg | ORAL_TABLET | Freq: Every day | ORAL | Status: DC
  Administered 2017-10-19 – 2017-10-25 (×7): 10 mg via ORAL
  Filled 2017-10-18 (×7): qty 1

## 2017-10-18 MED ORDER — ASPIRIN EC 81 MG PO TBEC
81.0000 mg | DELAYED_RELEASE_TABLET | Freq: Every day | ORAL | Status: DC
  Administered 2017-10-19 – 2017-10-25 (×7): 81 mg via ORAL
  Filled 2017-10-18 (×7): qty 1

## 2017-10-18 MED ORDER — HYDROXYZINE HCL 25 MG PO TABS
25.0000 mg | ORAL_TABLET | Freq: Two times a day (BID) | ORAL | Status: DC | PRN
  Administered 2017-10-19: 25 mg via ORAL
  Filled 2017-10-18: qty 1

## 2017-10-18 MED ORDER — ONDANSETRON HCL 4 MG/2ML IJ SOLN: 4.0000 mg | Freq: Four times a day (QID) | INTRAMUSCULAR | Status: DC | PRN

## 2017-10-18 MED ORDER — ALBUTEROL SULFATE (2.5 MG/3ML) 0.083% IN NEBU
2.5000 mg | INHALATION_SOLUTION | Freq: Once | RESPIRATORY_TRACT | Status: AC
  Administered 2017-10-18: 2.5 mg via RESPIRATORY_TRACT
  Filled 2017-10-18: qty 3

## 2017-10-18 MED ORDER — IPRATROPIUM-ALBUTEROL 0.5-2.5 (3) MG/3ML IN SOLN
3.0000 mL | Freq: Four times a day (QID) | RESPIRATORY_TRACT | Status: DC
  Administered 2017-10-19 – 2017-10-22 (×13): 3 mL via RESPIRATORY_TRACT
  Filled 2017-10-18 (×14): qty 3

## 2017-10-18 MED ORDER — DOCUSATE SODIUM 100 MG PO CAPS
100.0000 mg | ORAL_CAPSULE | Freq: Two times a day (BID) | ORAL | Status: DC
  Administered 2017-10-19 – 2017-10-24 (×13): 100 mg via ORAL
  Filled 2017-10-18 (×14): qty 1

## 2017-10-18 MED ORDER — ZOLPIDEM TARTRATE 5 MG PO TABS
5.0000 mg | ORAL_TABLET | Freq: Every evening | ORAL | Status: DC | PRN
Start: 2017-10-18 — End: 2017-10-25

## 2017-10-18 MED ORDER — NEPRO/CARBSTEADY PO LIQD: 237.0000 mL | Freq: Three times a day (TID) | ORAL | Status: DC | PRN

## 2017-10-18 MED ORDER — IPRATROPIUM-ALBUTEROL 0.5-2.5 (3) MG/3ML IN SOLN: 3.0000 mL | Freq: Once | RESPIRATORY_TRACT | Status: DC

## 2017-10-18 MED ORDER — ISOSORBIDE MONONITRATE ER 30 MG PO TB24
30.0000 mg | ORAL_TABLET | Freq: Every day | ORAL | Status: DC
  Administered 2017-10-19 – 2017-10-25 (×7): 30 mg via ORAL
  Filled 2017-10-18 (×7): qty 1

## 2017-10-18 MED ORDER — INSULIN GLARGINE 100 UNIT/ML ~~LOC~~ SOLN
14.0000 [IU] | Freq: Every day | SUBCUTANEOUS | Status: DC
  Administered 2017-10-19 – 2017-10-24 (×7): 14 [IU] via SUBCUTANEOUS
  Filled 2017-10-18 (×8): qty 0.14

## 2017-10-18 MED ORDER — ENOXAPARIN SODIUM 30 MG/0.3ML ~~LOC~~ SOLN
30.0000 mg | Freq: Every day | SUBCUTANEOUS | Status: DC
  Administered 2017-10-19 – 2017-10-24 (×7): 30 mg via SUBCUTANEOUS
  Filled 2017-10-18 (×8): qty 0.3

## 2017-10-18 MED ORDER — METHYLPREDNISOLONE SODIUM SUCC 125 MG IJ SOLR
125.0000 mg | Freq: Once | INTRAMUSCULAR | Status: AC
  Administered 2017-10-18: 125 mg via INTRAVENOUS
  Filled 2017-10-18: qty 2

## 2017-10-18 NOTE — ED Notes (Signed)
Attempt to call report. Floor RN will return call for report when she is available

## 2017-10-18 NOTE — ED Notes (Signed)
Patient states he has a fungal skin infection. Patient placed on Contact isolation.

## 2017-10-18 NOTE — ED Notes (Signed)
Bed: WA15 Expected date:  Expected time:  Means of arrival:  Comments: EMS-SOB 

## 2017-10-18 NOTE — Telephone Encounter (Signed)
Please advise 

## 2017-10-18 NOTE — ED Triage Notes (Signed)
Per EMS-states history of COPD/CHF-states increased symptoms today-given 10 mg of albuterol and 5 mg of Atrovent given in route-90 on RA-100% while on neb treatement

## 2017-10-18 NOTE — ED Notes (Signed)
Attempted IV x 2 with no success. 

## 2017-10-18 NOTE — H&P (Signed)
History and Physical    Greg Jones MWU:[id number] DOB: 1927-07-24 DOA: 10/18/2017  PCP: Deeann Saint, MD Consultants:   Vassie Loll -pulmonology; [facility]; Algie Coffer - cardiology; [facility] Patient coming from:  Home - lives with sister; NOK: sister  Chief Complaint:  SOB  HPI: Greg Jones is a 84 y.o. male with medical history significant of COPD; CKD; CHF; HTN; and GERD presenting with SOB.  He reports that he is here for SOB and also a rash that he is supposed to be seeing a doctor for Monday, that is not getting any better, breaking out even in his hair, neck, back.  SOB for over a year, seems to be getting shorter.  He is coughing very little, occasionally productive of whitish sputum.  No fever.  +wheezing.  +tobacco remotely, quit 4 years ago after smoking for 70 years (about 1/2 ppd).  The rash started about a month ago.  He has about 10 different creams - they occasionally stop the itching but the rash does not change and is getting worse.  Weight has been stable.  ED Course:  Copd worsening for a few weeks.  Tachypnea, pursed lip breathing.  Solumedrol, nebs.  Needs admission.  Has erythematous rash from torso down, being treated for fungal infection.  Review of Systems: As per HPI; otherwise review of systems reviewed and negative.   Ambulatory Status:  Ambulates with a rollator  Past Medical History:  Diagnosis Date  . CHF (congestive heart failure) (HCC)   . Chronic kidney disease   . COPD (chronic obstructive pulmonary disease) (HCC)   . GERD (gastroesophageal reflux disease)   . Hypertension   . Respiratory insufficiency     History reviewed. No pertinent surgical history.  Social History   Socioeconomic History  . Marital status: Widowed    Spouse name: Not on file  . Number of children: Not on file  . Years of education: Not on file  . Highest education level: Not on file  Occupational History  . Not on file  Social Needs  . Financial resource strain: Not  on file  . Food insecurity:    Worry: Not on file    Inability: Not on file  . Transportation needs:    Medical: Not on file    Non-medical: Not on file  Tobacco Use  . Smoking status: Former Smoker    Packs/day: 0.50    Years: 70.00    Pack years: 35.00    Types: Cigarettes    Last attempt to quit: 04/19/2013    Years since quitting: 4.5  . Smokeless tobacco: Never Used  Substance and Sexual Activity  . Alcohol use: No    Alcohol/week: 0.0 oz  . Drug use: No  . Sexual activity: Never  Lifestyle  . Physical activity:    Days per week: Not on file    Minutes per session: Not on file  . Stress: Not on file  Relationships  . Social connections:    Talks on phone: Not on file    Gets together: Not on file    Attends religious service: Not on file    Active member of club or organization: Not on file    Attends meetings of clubs or organizations: Not on file    Relationship status: Not on file  . Intimate partner violence:    Fear of current or ex partner: Not on file    Emotionally abused: Not on file    Physically abused: Not on file  Forced sexual activity: Not on file  Other Topics Concern  . Not on file  Social History Narrative  . Not on file    Allergies  Allergen Reactions  . Other Other (See Comments)    Oxygen Sensitive  . Penicillins Rash and Other (See Comments)    Any -illins as well as Mycins Has patient had a PCN reaction causing immediate rash, facial/tongue/throat swelling, SOB or lightheadedness with hypotension: Yes Has patient had a PCN reaction causing severe rash involving mucus membranes or skin necrosis: Yes Has patient had a PCN reaction that required hospitalization No Has patient had a PCN reaction occurring within the last 10 years: No If all of the above answers are "NO", then may proceed with Cephalosporin use.   . Vitamin B12 Itching, Rash and Other (See Comments)    injections    Family History  Problem Relation Age of Onset    . Tuberculosis Mother   . Diabetes Father     Prior to Admission medications   Medication Sig Start Date End Date Taking? Authorizing Provider  albuterol (PROVENTIL HFA) 108 (90 Base) MCG/ACT inhaler Inhale into the lungs every 6 (six) hours as needed for wheezing or shortness of breath.   Yes [provider]  albuterol (PROVENTIL HFA;VENTOLIN HFA) 108 (90 Base) MCG/ACT inhaler Inhale 2 puffs into the lungs every 6 (six) hours as needed for wheezing or shortness of breath. 08/22/17  Yes Buriev, Isaiah Serge, MD  albuterol (PROVENTIL) (2.5 MG/3ML) 0.083% nebulizer solution Take 3 mLs (2.5 mg total) by nebulization every 6 (six) hours. DX:  J44.9 03/23/17  Yes Oretha Milch, MD  amLODipine (NORVASC) 5 MG tablet Take 1 tablet (5 mg total) by mouth daily. Patient taking differently: Take 10 mg by mouth daily.  09/14/15  Yes Simonne Martinet, NP  aspirin EC 81 MG tablet Take 81 mg by mouth daily.   Yes [provider]  budesonide-formoterol (SYMBICORT) 160-4.5 MCG/ACT inhaler Inhale 2 puffs into the lungs 2 (two) times daily.   Yes [provider]  calcitRIOL (ROCALTROL) 0.25 MCG capsule Take 0.25 mcg by mouth every other day.   Yes [provider]  camphor-menthol Wynelle Fanny) lotion Apply 1 application topically as needed for itching.   Yes [provider]  furosemide (LASIX) 40 MG tablet Take 80 mg by mouth daily.  10/12/17  Yes [provider]  hydrOXYzine (ATARAX/VISTARIL) 25 MG tablet Take 1 tablet (25 mg total) by mouth 2 (two) times daily as needed for itching. 10/12/17  Yes Deeann Saint, MD  insulin glargine (LANTUS) 100 UNIT/ML injection Inject 14 Units into the skin at bedtime.   Yes [provider]  isosorbide mononitrate (IMDUR) 30 MG 24 hr tablet Take 30 mg by mouth daily.   Yes [provider]  omeprazole (PRILOSEC) 20 MG capsule Take 20 mg by mouth daily.   Yes [provider]  predniSONE (DELTASONE) 10 MG tablet  Take 1 tablet (10 mg total) by mouth daily. 08/22/17  Yes Buriev, Isaiah Serge, MD  predniSONE (DELTASONE) 10 MG tablet Take 10 mg by mouth daily with breakfast.   Yes [provider]  terbinafine (LAMISIL AT) 1 % cream Apply 1 application topically 2 (two) times daily. 10/12/17  Yes Deeann Saint, MD  tiotropium (SPIRIVA) 18 MCG inhalation capsule Place 18 mcg into inhaler and inhale daily.   Yes [provider]  triamcinolone cream (KENALOG) 0.1 % Apply 1 application topically 2 (two) times daily.  Yes [provider]  furosemide (LASIX) 20 MG tablet Take 2 tablets (40 mg total) by mouth daily. Patient not taking: Reported on 10/18/2017 09/11/16   Orpah Cobb, MD  predniSONE (DELTASONE) 20 MG tablet 2 tabs po daily x 4 days Patient not taking: Reported on 10/18/2017 10/03/17   Melene Plan, DO    Physical Exam: Vitals:   10/18/17 2000 10/18/17 2200 10/18/17 2245 10/18/17 2316  BP: 108/68 (!) 182/93  (!) 176/74  Pulse: (!) 103  99 97  Resp:    (!) 22  Temp:    98.1 F (36.7 C)  TempSrc:    Oral  SpO2: 94%  95% 97%  Weight:      Height:         General:  Appears calm and comfortable and is NAD Eyes:  PERRL, EOMI, normal lids, iris ENT:  grossly normal hearing, lips & tongue Neck:  no LAD, masses or thyromegaly Cardiovascular:  RRR, no m/r/g. No LE edema.  Respiratory:   CTA bilaterally with no wheezes/rales/rhonchi.  Mild tachypnea. Abdomen:  soft, NT, ND, NABS Skin:  Diffuse rash primarily on torso and abdomen but really diffuse all over his body.  It is really confluent on his chest and a bit more patchy on his abdomen.  It is pruritic.        Musculoskeletal:  grossly normal tone BUE/BLE, good ROM, no bony abnormality Psychiatric:  grossly normal mood and affect, speech fluent and appropriate, AOx3 Neurologic:  CN 2-12 grossly intact, moves all extremities in coordinated fashion, sensation intact    Radiological Exams on Admission: Dg Chest 2  View  Result Date: 10/18/2017 CLINICAL DATA:  Shortness of breath.  COPD. EXAM: CHEST - 2 VIEW COMPARISON:  10/03/2017. FINDINGS: Mediastinum and hilar structures normal. Cardiomegaly with normal pulmonary vascularity. No acute infiltrates. Low lung volumes with mild basilar atelectasis. No pleural effusion or pneumothorax. IMPRESSION: Cardiomegaly. No pulmonary venous congestion. No acute infiltrates. Mild bibasilar subsegmental atelectasis. Electronically Signed   By: Maisie Fus  Register   On: 10/18/2017 14:24    EKG: Independently reviewed.  NSR with rate 92; nonspecific ST changes with no evidence of acute ischemia   Labs on Admission: I have personally reviewed the available labs and imaging studies at the time of the admission.  Pertinent labs:   Glucose 131 BUN 22/Creatinine 2.47/GFR 22; prior 30/2.34/23 on 4/17 Anion gap 17 BNP 147.6 WBC 14.3  Assessment/Plan Principal Problem:   COPD with acute exacerbation (HCC) Active Problems:   Essential hypertension   Chronic combined systolic and diastolic CHF (congestive heart failure) (HCC)   CKD (chronic kidney disease), stage IV (HCC)   Type 2 diabetes mellitus with other specified complication (HCC)   COPD exacerbation, mild -Patient's progressive shortness of breath and intermittent cough may be caused by acute COPD exacerbation or may indicate progressive COPD. -He does not need home O2 and is not hypoxic. -Hee does not have fever but has leukocytosis. Chest x-ray is not consistent with pneumonia. -Will observe patient - it is not clear that he is has a fulminant COPD exacerbation. -Nebulizers: scheduled Duoneb and prn albuterol -Solu-Medrol 60 mg IV BID  -No antibiotics due to only 1 cardinal symptom   Pruritic rash -Patient with progressive pruritic skin rash -In review of pictures from clinic visit on 4/26, it has clearly progressed -It could be a drug rash since it has not responded to steroids or antifungals thus  far. -Lasix is a possible allergen given its sulfa connection -  Hold Lasix, steroids as above -Continue Sarna and Vistaril for itching -Consider requesting surgery to perform biopsy, as this may help to ensure that he is given a diagnosis at his dermatology appointment Monday.  (I suggested that the ER obtain a biopsy, but it appears they were too busy).  HTN -Continue Norvasc  CHF -08/15/17 echo with EF 45-50% and grade 1 diastolic dysfunction -Appears to be compensated at this time -Suggest judicious use of IVF  CKD -Appears to be generally stable -Suggest avoidance of neprotoxic medications  DVT prophylaxis: Lovenox  Code Status:  Full - confirmed with patient Family Communication: None present Disposition Plan:  Home once clinically improved Consults called: CM/SW/PT/OT/Nutrition/RT  Admission status: It is my clinical opinion that referral for OBSERVATION is reasonable and necessary in this patient based on the above information provided. The aforementioned taken together are felt to place the patient at high risk for further clinical deterioration. However it is anticipated that the patient may be medically stable for discharge from the hospital within 24 to 48 hours.   Jonah Blue MD [facility]  If note is complete, please contact covering daytime or nighttime physician. [email or web address] Password [facility]  10/18/2017, 11:44 PM

## 2017-10-18 NOTE — ED Provider Notes (Signed)
[facility]-EMERGENCY DEPT Provider Note   CSN: [id number] Arrival date & time: 10/18/17  1251     History   Chief Complaint Chief Complaint  Patient presents with  . Shortness of Breath    HPI Lenell Lama is a 84 y.o. male.  HPI  Pt comes in with cc of dib. Pt has medical history significant ofHTN,CHF, COPDon chronic steroids since 2017,DM Type II,CKD.  Pt has been having DIB for the last 2 days, and it got worse last night. Pt has taken treatments at home w/o relief. Pt's shortness of breath is worse with exertion and he has a cough and wheez. Pt denies any fevers and he is not a smoker.   Past Medical History:  Diagnosis Date  . CHF (congestive heart failure) (HCC)   . Chronic kidney disease   . COPD (chronic obstructive pulmonary disease) (HCC)   . GERD (gastroesophageal reflux disease)   . Hypertension   . Respiratory insufficiency     Patient Active Problem List   Diagnosis Date Noted  . Type 2 diabetes mellitus with other specified complication (HCC) 10/12/2017  . Pruritic rash 08/14/2017  . Pain and swelling of toe of left foot 08/14/2017  . Pressure injury of skin 04/12/2016  . Near syncope 04/11/2016  . Pre-syncope 04/11/2016  . CKD (chronic kidney disease), stage IV (HCC) 04/11/2016  . QT prolongation 04/11/2016  . Leukocytosis 04/11/2016  . Hypokalemia 04/11/2016  . Blood glucose elevated 04/11/2016  . Chronic combined systolic and diastolic CHF (congestive heart failure) (HCC) 01/11/2016  . Dyspnea   . COPD exacerbation (HCC) 09/07/2015  . GERD (gastroesophageal reflux disease) 05/28/2015  . COPD (chronic obstructive pulmonary disease) (HCC) 05/10/2015  . Essential hypertension 05/10/2015    History reviewed. No pertinent surgical history.      Home Medications    Prior to Admission medications   Medication Sig Start Date End Date Taking? Authorizing Provider  albuterol (PROVENTIL HFA) 108 (90 Base) MCG/ACT  inhaler Inhale into the lungs every 6 (six) hours as needed for wheezing or shortness of breath.   Yes [provider]  albuterol (PROVENTIL HFA;VENTOLIN HFA) 108 (90 Base) MCG/ACT inhaler Inhale 2 puffs into the lungs every 6 (six) hours as needed for wheezing or shortness of breath. 08/22/17  Yes Buriev, Isaiah Serge, MD  albuterol (PROVENTIL) (2.5 MG/3ML) 0.083% nebulizer solution Take 3 mLs (2.5 mg total) by nebulization every 6 (six) hours. DX:  J44.9 03/23/17  Yes Oretha Milch, MD  amLODipine (NORVASC) 5 MG tablet Take 1 tablet (5 mg total) by mouth daily. Patient taking differently: Take 10 mg by mouth daily.  09/14/15  Yes Simonne Martinet, NP  aspirin EC 81 MG tablet Take 81 mg by mouth daily.   Yes [provider]  budesonide-formoterol (SYMBICORT) 160-4.5 MCG/ACT inhaler Inhale 2 puffs into the lungs 2 (two) times daily.   Yes [provider]  calcitRIOL (ROCALTROL) 0.25 MCG capsule Take 0.25 mcg by mouth every other day.   Yes [provider]  camphor-menthol Wynelle Fanny) lotion Apply 1 application topically as needed for itching.   Yes [provider]  furosemide (LASIX) 40 MG tablet Take 80 mg by mouth daily.  10/12/17  Yes [provider]  hydrOXYzine (ATARAX/VISTARIL) 25 MG tablet Take 1 tablet (25 mg total) by mouth 2 (two) times daily as needed for itching. 10/12/17  Yes Deeann Saint, MD  insulin glargine (LANTUS) 100 UNIT/ML injection Inject 14 Units into the skin  at bedtime.   Yes [provider]  isosorbide mononitrate (IMDUR) 30 MG 24 hr tablet Take 30 mg by mouth daily.   Yes [provider]  omeprazole (PRILOSEC) 20 MG capsule Take 20 mg by mouth daily.   Yes [provider]  predniSONE (DELTASONE) 10 MG tablet Take 1 tablet (10 mg total) by mouth daily. 08/22/17  Yes Buriev, Isaiah Serge, MD  predniSONE (DELTASONE) 10 MG tablet Take 10 mg by mouth daily with breakfast.   Yes [provider]    terbinafine (LAMISIL AT) 1 % cream Apply 1 application topically 2 (two) times daily. 10/12/17  Yes Deeann Saint, MD  tiotropium (SPIRIVA) 18 MCG inhalation capsule Place 18 mcg into inhaler and inhale daily.   Yes [provider]  triamcinolone cream (KENALOG) 0.1 % Apply 1 application topically 2 (two) times daily.   Yes [provider]  furosemide (LASIX) 20 MG tablet Take 2 tablets (40 mg total) by mouth daily. Patient not taking: Reported on 10/18/2017 09/11/16   Orpah Cobb, MD  predniSONE (DELTASONE) 20 MG tablet 2 tabs po daily x 4 days Patient not taking: Reported on 10/18/2017 10/03/17   Melene Plan, DO    Family History Family History  Problem Relation Age of Onset  . Tuberculosis Mother   . Diabetes Father     Social History Social History   Tobacco Use  . Smoking status: Former Smoker    Packs/day: 0.50    Years: 70.00    Pack years: 35.00    Types: Cigarettes    Last attempt to quit: 04/19/2013    Years since quitting: 4.5  . Smokeless tobacco: Never Used  Substance Use Topics  . Alcohol use: No    Alcohol/week: 0.0 oz  . Drug use: No     Allergies   Other; Penicillins; and Vitamin b12   Review of Systems Review of Systems  Constitutional: Positive for activity change.  Respiratory: Positive for cough, shortness of breath and wheezing.   Cardiovascular: Negative for chest pain.  Hematological: Does not bruise/bleed easily.  All other systems reviewed and are negative.    Physical Exam Updated Vital Signs BP 139/83   Pulse 87   Temp 97.9 F (36.6 C) (Oral)   Resp 18   SpO2 93%   Physical Exam  Constitutional: He is oriented to person, place, and time. He appears well-developed.  HENT:  Head: Atraumatic.  Neck: Neck supple. No JVD present.  Cardiovascular: Normal rate.  Pulmonary/Chest: Effort normal. He has wheezes.  Abdominal: Soft.  Musculoskeletal:       Right lower leg: He exhibits no tenderness and no edema.        Left lower leg: He exhibits no tenderness and no edema.  Neurological: He is alert and oriented to person, place, and time.  Skin: Skin is warm.  Nursing note and vitals reviewed.    ED Treatments / Results  Labs (all labs ordered are listed, but only abnormal results are displayed) Labs Reviewed  BASIC METABOLIC PANEL - Abnormal; Notable for the following components:      Result Value   Sodium 147 (*)    Glucose, Bld 131 (*)    BUN 22 (*)    Creatinine, Ser 2.47 (*)    GFR calc non Af Amer 22 (*)    GFR calc Af Amer 25 (*)    Anion gap 17 (*)    All other components within normal limits  CBC WITH DIFFERENTIAL/PLATELET -  Abnormal; Notable for the following components:   WBC 14.3 (*)    RDW 17.3 (*)    Neutro Abs 11.1 (*)    All other components within normal limits  MAGNESIUM  BRAIN NATRIURETIC PEPTIDE    EKG EKG Interpretation  Date/Time:  Thursday Oct 18 2017 13:05:14 EDT Ventricular Rate:  92 PR Interval:    QRS Duration: 81 QT Interval:  396 QTC Calculation: 490 R Axis:   -21 Text Interpretation:  Sinus tachycardia Multiform ventricular premature complexes Borderline short PR interval Borderline left axis deviation Low voltage, precordial leads Nonspecific T abnrm, anterolateral leads Borderline prolonged QT interval No significant change since last tracing Confirmed by Drema Pry [phone]) on 10/18/2017 4:40:46 PM   Radiology Dg Chest 2 View  Result Date: 10/18/2017 CLINICAL DATA:  Shortness of breath.  COPD. EXAM: CHEST - 2 VIEW COMPARISON:  10/03/2017. FINDINGS: Mediastinum and hilar structures normal. Cardiomegaly with normal pulmonary vascularity. No acute infiltrates. Low lung volumes with mild basilar atelectasis. No pleural effusion or pneumothorax. IMPRESSION: Cardiomegaly. No pulmonary venous congestion. No acute infiltrates. Mild bibasilar subsegmental atelectasis. Electronically Signed   By: Maisie Fus  Register   On: 10/18/2017 14:24     Procedures Procedures (including critical care time)  CRITICAL CARE Performed by: Nazarene Bunning   Total critical care time: 38 minutes  Critical care time was exclusive of separately billable procedures and treating other patients.  Critical care was necessary to treat or prevent imminent or life-threatening deterioration.  Critical care was time spent personally by me on the following activities: development of treatment plan with patient and/or surrogate as well as nursing, discussions with consultants, evaluation of patient's response to treatment, examination of patient, obtaining history from patient or surrogate, ordering and performing treatments and interventions, ordering and review of laboratory studies, ordering and review of radiographic studies, pulse oximetry and re-evaluation of patient's condition.   Medications Ordered in ED Medications  ipratropium-albuterol (DUONEB) 0.5-2.5 (3) MG/3ML nebulizer solution 3 mL (has no administration in time range)  magnesium sulfate IVPB 2 g 50 mL (has no administration in time range)  ipratropium-albuterol (DUONEB) 0.5-2.5 (3) MG/3ML nebulizer solution 3 mL (3 mLs Nebulization Given 10/18/17 1606)  methylPREDNISolone sodium succinate (SOLU-MEDROL) 125 mg/2 mL injection 125 mg (125 mg Intravenous Given 10/18/17 1609)     Initial Impression / Assessment and Plan / ED Course  I have reviewed the triage vital signs and the nursing notes.  Pertinent labs & imaging results that were available during my care of the patient were reviewed by me and considered in my medical decision making (see chart for details).     Pt comes in with cc of dib. Pt has CHF, COPD and he has worsening dib, mostly with exertion. Pt has no orthpnea and he is wheezing. Pt was given 10 mg nebs by EMS along with solumedrol. Pt feels better, but not 100%. He was given another neb in the ER, but when we attempted to move hi, he felt really short of breath. Pt is  not comfortable going home - we will admit as obs.  Seems COPD and not CHF based on hx and CXR. No concerns for PNA.  Final Clinical Impressions(s) / ED Diagnoses   Final diagnoses:  COPD with acute exacerbation [facility])    ED Discharge Orders    None       Derwood Kaplan, MD 10/18/17 1653

## 2017-10-19 DIAGNOSIS — K219 Gastro-esophageal reflux disease without esophagitis: Secondary | ICD-10-CM | POA: Diagnosis present

## 2017-10-19 DIAGNOSIS — I5042 Chronic combined systolic (congestive) and diastolic (congestive) heart failure: Secondary | ICD-10-CM

## 2017-10-19 DIAGNOSIS — Z794 Long term (current) use of insulin: Secondary | ICD-10-CM

## 2017-10-19 DIAGNOSIS — E1169 Type 2 diabetes mellitus with other specified complication: Secondary | ICD-10-CM

## 2017-10-19 DIAGNOSIS — Z7952 Long term (current) use of systemic steroids: Secondary | ICD-10-CM | POA: Diagnosis not present

## 2017-10-19 DIAGNOSIS — D72829 Elevated white blood cell count, unspecified: Secondary | ICD-10-CM | POA: Diagnosis present

## 2017-10-19 DIAGNOSIS — J441 Chronic obstructive pulmonary disease with (acute) exacerbation: Secondary | ICD-10-CM | POA: Diagnosis present

## 2017-10-19 DIAGNOSIS — R21 Rash and other nonspecific skin eruption: Secondary | ICD-10-CM | POA: Diagnosis present

## 2017-10-19 DIAGNOSIS — Z87891 Personal history of nicotine dependence: Secondary | ICD-10-CM | POA: Diagnosis not present

## 2017-10-19 DIAGNOSIS — N184 Chronic kidney disease, stage 4 (severe): Secondary | ICD-10-CM

## 2017-10-19 DIAGNOSIS — R5381 Other malaise: Secondary | ICD-10-CM | POA: Diagnosis present

## 2017-10-19 DIAGNOSIS — E1122 Type 2 diabetes mellitus with diabetic chronic kidney disease: Secondary | ICD-10-CM | POA: Diagnosis present

## 2017-10-19 DIAGNOSIS — I10 Essential (primary) hypertension: Secondary | ICD-10-CM | POA: Diagnosis not present

## 2017-10-19 DIAGNOSIS — Z7982 Long term (current) use of aspirin: Secondary | ICD-10-CM | POA: Diagnosis not present

## 2017-10-19 DIAGNOSIS — Z7951 Long term (current) use of inhaled steroids: Secondary | ICD-10-CM | POA: Diagnosis not present

## 2017-10-19 DIAGNOSIS — Z833 Family history of diabetes mellitus: Secondary | ICD-10-CM | POA: Diagnosis not present

## 2017-10-19 DIAGNOSIS — E870 Hyperosmolality and hypernatremia: Secondary | ICD-10-CM | POA: Diagnosis present

## 2017-10-19 DIAGNOSIS — I130 Hypertensive heart and chronic kidney disease with heart failure and stage 1 through stage 4 chronic kidney disease, or unspecified chronic kidney disease: Secondary | ICD-10-CM | POA: Diagnosis present

## 2017-10-19 LAB — BASIC METABOLIC PANEL
Anion gap: 14 (ref 5–15)
BUN: 25 mg/dL — ABNORMAL HIGH (ref 6–20)
CALCIUM: 9.2 mg/dL (ref 8.9–10.3)
CO2: 26 mmol/L (ref 22–32)
CREATININE: 2.34 mg/dL — AB (ref 0.61–1.24)
Chloride: 104 mmol/L (ref 101–111)
GFR, EST AFRICAN AMERICAN: 27 mL/min — AB (ref >60)
GFR, EST NON AFRICAN AMERICAN: 23 mL/min — AB (ref >60)
Glucose, Bld: 137 mg/dL — ABNORMAL HIGH (ref 65–99)
Potassium: 3.7 mmol/L (ref 3.5–5.1)
Sodium: 144 mmol/L (ref 135–145)

## 2017-10-19 LAB — CBC
HCT: 39.6 % (ref 39.0–52.0)
Hemoglobin: 12.5 g/dL — ABNORMAL LOW (ref 13.0–17.0)
MCH: 27.2 pg (ref 26.0–34.0)
MCHC: 31.6 g/dL (ref 30.0–36.0)
MCV: 86.1 fL (ref 78.0–100.0)
Platelets: 258 10*3/uL (ref 150–400)
RBC: 4.6 MIL/uL (ref 4.22–5.81)
RDW: 17.3 % — ABNORMAL HIGH (ref 11.5–15.5)
WBC: 12.6 10*3/uL — ABNORMAL HIGH (ref 4.0–10.5)

## 2017-10-19 LAB — GLUCOSE, CAPILLARY
GLUCOSE-CAPILLARY: 150 mg/dL — AB (ref 65–99)
GLUCOSE-CAPILLARY: 156 mg/dL — AB (ref 65–99)
GLUCOSE-CAPILLARY: 185 mg/dL — AB (ref 65–99)

## 2017-10-19 MED ORDER — NEPRO/CARBSTEADY PO LIQD: 237.0000 mL | Freq: Every day | ORAL | Status: DC | PRN

## 2017-10-19 MED ORDER — FLUTICASONE PROPIONATE 50 MCG/ACT NA SUSP
2.0000 | Freq: Every day | NASAL | Status: DC
  Administered 2017-10-19 – 2017-10-25 (×7): 2 via NASAL
  Filled 2017-10-19: qty 16

## 2017-10-19 MED ORDER — DOXYCYCLINE HYCLATE 100 MG PO TABS
100.0000 mg | ORAL_TABLET | Freq: Two times a day (BID) | ORAL | Status: DC
  Administered 2017-10-19 – 2017-10-25 (×13): 100 mg via ORAL
  Filled 2017-10-19 (×14): qty 1

## 2017-10-19 MED ORDER — METHYLPREDNISOLONE SODIUM SUCC 125 MG IJ SOLR
60.0000 mg | Freq: Two times a day (BID) | INTRAMUSCULAR | Status: DC
  Administered 2017-10-19 – 2017-10-21 (×5): 60 mg via INTRAVENOUS
  Filled 2017-10-19 (×5): qty 2

## 2017-10-19 MED ORDER — LORATADINE 10 MG PO TABS
10.0000 mg | ORAL_TABLET | Freq: Every day | ORAL | Status: DC
  Administered 2017-10-19 – 2017-10-25 (×7): 10 mg via ORAL
  Filled 2017-10-19 (×7): qty 1

## 2017-10-19 MED ORDER — BUDESONIDE 0.5 MG/2ML IN SUSP
0.5000 mg | Freq: Two times a day (BID) | RESPIRATORY_TRACT | Status: DC
  Administered 2017-10-19 – 2017-10-25 (×13): 0.5 mg via RESPIRATORY_TRACT
  Filled 2017-10-19 (×13): qty 2

## 2017-10-19 MED ORDER — INSULIN ASPART 100 UNIT/ML ~~LOC~~ SOLN
0.0000 [IU] | Freq: Every day | SUBCUTANEOUS | Status: DC
  Administered 2017-10-21 – 2017-10-23 (×2): 2 [IU] via SUBCUTANEOUS

## 2017-10-19 MED ORDER — INSULIN ASPART 100 UNIT/ML ~~LOC~~ SOLN
0.0000 [IU] | Freq: Three times a day (TID) | SUBCUTANEOUS | Status: DC
  Administered 2017-10-19: 4 [IU] via SUBCUTANEOUS
  Administered 2017-10-20: 3 [IU] via SUBCUTANEOUS
  Administered 2017-10-20: 4 [IU] via SUBCUTANEOUS
  Administered 2017-10-20: 7 [IU] via SUBCUTANEOUS
  Administered 2017-10-21 (×3): 4 [IU] via SUBCUTANEOUS
  Administered 2017-10-22: 3 [IU] via SUBCUTANEOUS
  Administered 2017-10-23: 4 [IU] via SUBCUTANEOUS

## 2017-10-19 NOTE — Evaluation (Signed)
Occupational Therapy Evaluation Patient Details Name: Greg Jones MRN: [id number] DOB: 1927-10-07 Today's Date: 10/19/2017    History of Present Illness Pt was admitted with COPD with acute exacerbation.  PMH:  CKD, CHF, HTN, DM   Clinical Impression   This 84 year old man was admitted for the above.  At baseline, he reports that his sister, whom he lives with, helps him extensively with adls.  He gets to the bathroom on his own. Will follow in acute setting with that focus     Follow Up Recommendations  Home health OT    Equipment Recommendations  None recommended by OT    Recommendations for Other Services       Precautions / Restrictions Precautions Precautions: Fall Restrictions Weight Bearing Restrictions: No      Mobility Bed Mobility Overal bed mobility: Needs Assistance Bed Mobility: Supine to Sit     Supine to sit: Min assist     General bed mobility comments: light assistance for OOB  Transfers Overall transfer level: Needs assistance Equipment used: Rolling walker (2 wheeled) Transfers: Sit to/from [employer] Sit to Stand: Min assist Stand pivot transfers: Min assist       General transfer comment: steadying assistance; cues for walker safety--tends to pick it up and not have all 4 points on floor    Balance Overall balance assessment: Needs assistance           Standing balance-Leahy Scale: Poor Standing balance comment: reliant upon RW; uses rollator at home                           ADL either performed or assessed with clinical judgement   ADL Overall ADL's : Needs assistance/impaired Eating/Feeding: Independent   Grooming: Oral care;Min guard;Standing   Upper Body Bathing: Minimal assistance;Sitting   Lower Body Bathing: Maximal assistance;Sit to/from stand   Upper Body Dressing : Minimal assistance;Sitting   Lower Body Dressing: Maximal assistance;Sit to/from stand   Toilet Transfer: Minimal  assistance;Stand-pivot;RW(to chair)             General ADL Comments: Pt states that his sister helps him extensively with adls at home.  Sats in mid 90s but WOB increased.  Encouraged pursed lip breathing.       Vision         Perception     Praxis      Pertinent Vitals/Pain Pain Assessment: No/denies pain     Hand Dominance     Extremity/Trunk Assessment Upper Extremity Assessment Upper Extremity Assessment: Generalized weakness           Communication Communication Communication: No difficulties   Cognition Arousal/Alertness: Awake/alert Behavior During Therapy: WFL for tasks assessed/performed Overall Cognitive Status: No family/caregiver present to determine baseline cognitive functioning                                 General Comments: Pt states he hasn't brushed teeth in 3 years, ? accuracy.  Cues for safety with RW   General Comments       Exercises     Shoulder Instructions      Home Living Family/patient expects to be discharged to:: Private residence Living Arrangements: Other relatives Available Help at Discharge: Family(sister)               Bathroom Shower/Tub: Walk-in Human resources officer: Standard  Home Equipment: Walker - 4 wheels;Shower seat   Additional Comments: has BSC but doesn't use it. Has seat in shower      Prior Functioning/Environment Level of Independence: Independent with assistive device(s)                 OT Problem List:        OT Treatment/Interventions:      OT Goals(Current goals can be found in the care plan section) Acute Rehab OT Goals Patient Stated Goal: none stated; agreeable to OOB OT Goal Formulation: With patient Time For Goal Achievement: 11/02/17 Potential to Achieve Goals: Good ADL Goals Pt Will Transfer to Toilet: with supervision;ambulating;bedside commode(with rollator) Pt Will Perform Toileting - Clothing Manipulation and hygiene: with supervision;sit  to/from stand  OT Frequency: Min 2X/week   Barriers to D/C:            Co-evaluation              AM-PAC PT "6 Clicks" Daily Activity     Outcome Measure Help from another person eating meals?: None Help from another person taking care of personal grooming?: A Little Help from another person toileting, which includes using toliet, bedpan, or urinal?: A Little Help from another person bathing (including washing, rinsing, drying)?: A Lot Help from another person to put on and taking off regular upper body clothing?: A Little Help from another person to put on and taking off regular lower body clothing?: Total 6 Click Score: 16   End of Session    Activity Tolerance: (work of breathing) Patient left: in chair;with call bell/phone within reach;with chair alarm set  OT Visit Diagnosis: Muscle weakness (generalized) (M62.81);Unsteadiness on feet (R26.81)                Time: 1610-9604 OT Time Calculation (min): 27 min Charges:  OT General Charges $OT Visit: 1 Visit OT Evaluation $OT Eval Low Complexity: 1 Low OT Treatments $Self Care/Home Management : 8-22 mins G-Codes:     [place], Greg Jones [phone] 10/19/2017  Algenis Ballin 10/19/2017, 12:28 PM

## 2017-10-19 NOTE — Plan of Care (Signed)
  Problem: Nutrition: Goal: Adequate nutrition will be maintained Outcome: Progressing   

## 2017-10-19 NOTE — Telephone Encounter (Signed)
Ok

## 2017-10-19 NOTE — Clinical Social Work Note (Signed)
Clinical Social Work Assessment  Patient Details  Name: Greg Jones MRN: [id number] Date of Birth: [birth year]  Date of referral:  10/19/17               Reason for consult:  Other (Comment Required)(COPD Gold Protocol )                Permission sought to share information with:  Family Supports Permission granted to share information::     Name::        Agency::     Relationship::  Sister  Contact Information:     Housing/Transportation Living arrangements for the past 2 months:  Single Family Home Source of Information:  Patient Patient Interpreter Needed:  None Criminal Activity/Legal Involvement Pertinent to Current Situation/Hospitalization:  No - Comment as needed Significant Relationships:  Siblings Lives with:  Siblings Do you feel safe going back to the place where you live?  Yes Need for family participation in patient care:  Yes (Comment)  Care giving concerns:   COPD Gold Protocol   Social Worker assessment / plan:  CSW received consult for COPD Gold Protocol - CSW administered Depression Scale (patient scored 3/27). Screening forms placed on shadow chart.Patient does not have any concerns with depression. He reports the "I have COPD and it gets bad sometime but I still get up and like doing things."  At baseline patient uses a rolator for ambulatory support. Patient reports, "when I am outside in the heat I loose my breath easily." He explain his recent experience while in garage he had difficulty breathing and sister brought him in for SOB.Patient reports he lives at home with his sister Greg Jones/primary caretaker. She assist him with cooking, cleaning and bathing.  The patient reports he has a aide that come in three time a weeks as well.   Plan:Patient plans to return home with sister and Home Health services that have been arranged by case management.  No other CSW needs identified at this time.   Employment status:  Retired Health and safety inspector:  Med[employer] PT  Recommendations:  Home with Home Health Information / Referral to community resources:     Patient/Family's Response to care: Agreeable and Responding well to care.   Patient/Family's Understanding of and Emotional Response to Diagnosis, Current Treatment, and Prognosis:  Patient has basic understanding of his diagnosis, he reports feeling "out of breath all the time." Patient reports his sister primary caregiver/ she was not present during PHQ9-screening.   Emotional Assessment Appearance:  Appears stated age Attitude/Demeanor/Rapport:    Affect (typically observed):  Accepting, Pleasant Orientation:  Oriented to Self, Oriented to Place, Oriented to  Time, Oriented to Situation Alcohol / Substance use:  Not Applicable Psych involvement (Current and /or in the community):  No (Comment)  Discharge Needs  Concerns to be addressed:  Coping/Stress Concerns Readmission within the last 30 days:  Yes Current discharge risk:  Chronically ill Barriers to Discharge:  No Barriers Identified   Clearance Coots, LCSW 10/19/2017, 3:09 PM

## 2017-10-19 NOTE — Progress Notes (Signed)
PROGRESS NOTE    Greg Jones  WUJ:[id number] DOB: [birth year] DOA: 10/18/2017 PCP: Deeann Saint, MD    Brief Narrative:  Greg Jones is a 84 y.o. male with medical history significant of COPD; CKD; CHF; HTN; and GERD presenting with SOB.  He reports that he is here for SOB and also a rash that he is supposed to be seeing a doctor for Monday, that is not getting any better, breaking out even in his hair, neck, back.  SOB for over a year, seems to be getting shorter.  He is coughing very little, occasionally productive of whitish sputum.  No fever.  +wheezing.  +tobacco remotely, quit 4 years ago after smoking for 70 years (about 1/2 ppd).  The rash started about a month ago.  He has about 10 different creams - they occasionally stop the itching but the rash does not change and is getting worse.  Weight has been stable.  ED Course:  Copd worsening for a few weeks.  Tachypnea, pursed lip breathing.  Solumedrol, nebs.  Needs admission.  Has erythematous rash from torso down, being treated for fungal infection.     Assessment & Plan:   Principal Problem:   COPD with acute exacerbation (HCC) Active Problems:   Essential hypertension   Chronic combined systolic and diastolic CHF (congestive heart failure) (HCC)   CKD (chronic kidney disease), stage IV (HCC)   Type 2 diabetes mellitus with other specified complication (HCC)  #1 acute COPD exacerbation Patient presented with mild acute COPD exacerbation with and wheezing Some worsening shortness of breath that has chronically been getting worse over the past year.  Patient noted to have a history of severe COPD not on home O2 per patient.  Continue Solu-Medrol 60 mg IV every 12 hours, scheduled duo nebs, PPI.  Add doxycycline, Pulmicort, Claritin, Flonase to current regimen.  Likely transition to oral prednisone tomorrow.  Will need outpatient follow-up with pulmonologist and PCP.  2.  Hypertension Blood Pressure well controlled at  this time.  Continue Norvasc and imdur  3.  Chronic kidney disease stage IV Stable.  4.  Type 2 diabetes Hemoglobin A1c was 8.1 on 08/18/2017.  CBGs 150.  Continue Lantus.  Place on the sliding scale insulin.  CBGs before meals and at bedtime.  5.  Chronic combined systolic and diastolic CHF 2D echo from 08/15/2017 with a EF of 45 to 50% with grade 1 diastolic dysfunction.  Compensated.  Monitor closely with hydration.    6 pruritic rash Patient with a progressive pruritic skin rash.  Lasix on hold.  Patient on IV steroids.  Continues on Vistaril for itching.  Patient noted to have a dermatology appointment on Monday, Oct 22, 2017.  Outpatient follow-up with dermatology.   DVT prophylaxis: Lovenox Code Status: Full Family Communication: updated patient.  No family at bedside. Disposition Plan: Likely home when clinically improved and on oral steroids likely the next 24 hours.   Consultants:   None  Procedures:   Chest x-ray 10/18/2017  Antimicrobials:   Doxycycline 10/19/2017   Subjective: Sitting up in chair.  States feels better than on admission.  Still with some wheezing however improving.  No chest pain.  No shortness of breath.  Objective: Vitals:   10/19/17 0854 10/19/17 1115 10/19/17 1148 10/19/17 1250  BP:  135/62    Pulse:  82  91  Resp:      Temp:      TempSrc:      SpO2: 93% 100% 100%  96%  Weight:      Height:        Intake/Output Summary (Last 24 hours) at 10/19/2017 1328 Last data filed at 10/19/2017 1000 Gross per 24 hour  Intake 620 ml  Output 150 ml  Net 470 ml   Filed Weights   10/18/17 1721  Weight: 84.8 kg (187 lb)    Examination:  General exam: Appears calm and comfortable  Respiratory system: Some mild expiratory wheezing.  No rhonchi.  No crackles.  Respiratory effort normal. Cardiovascular system: S1 & S2 heard, RRR. No JVD, murmurs, rubs, gallops or clicks. No pedal edema. Gastrointestinal system: Abdomen is nondistended, soft and  nontender. No organomegaly or masses felt. Normal bowel sounds heard. Central nervous system: Alert and oriented. No focal neurological deficits. Extremities: Symmetric 5 x 5 power. Skin: Diffuse rash on torso and abdomen with some confluence on the chest and some patchy areas on the abdomen.  Pruritic.  Psychiatry: Judgement and insight appear normal. Mood & affect appropriate.     Data Reviewed: I have personally reviewed following labs and imaging studies  CBC: Recent Labs  Lab 10/18/17 1611 10/19/17 0559  WBC 14.3* 12.6*  NEUTROABS 11.1*  --   HGB 15.9 12.5*  HCT 48.5 39.6  MCV 86.8 86.1  PLT 292 258   Basic Metabolic Panel: Recent Labs  Lab 10/18/17 1611 10/19/17 0559  NA 147* 144  K 3.5 3.7  CL 103 104  CO2 27 26  GLUCOSE 131* 137*  BUN 22* 25*  CREATININE 2.47* 2.34*  CALCIUM 9.9 9.2  MG 2.2  --    GFR: Estimated Creatinine Clearance: 22.7 mL/min (A) (by C-G formula based on SCr of 2.34 mg/dL (H)). Liver Function Tests: No results for input(s): AST, ALT, ALKPHOS, BILITOT, PROT, ALBUMIN in the last 168 hours. No results for input(s): LIPASE, AMYLASE in the last 168 hours. No results for input(s): AMMONIA in the last 168 hours. Coagulation Profile: No results for input(s): INR, PROTIME in the last 168 hours. Cardiac Enzymes: No results for input(s): CKTOTAL, CKMB, CKMBINDEX, TROPONINI in the last 168 hours. BNP (last 3 results) No results for input(s): PROBNP in the last 8760 hours. HbA1C: No results for input(s): HGBA1C in the last 72 hours. CBG: Recent Labs  Lab 10/19/17 0001  GLUCAP 150*   Lipid Profile: No results for input(s): CHOL, HDL, LDLCALC, TRIG, CHOLHDL, LDLDIRECT in the last 72 hours. Thyroid Function Tests: No results for input(s): TSH, T4TOTAL, FREET4, T3FREE, THYROIDAB in the last 72 hours. Anemia Panel: No results for input(s): VITAMINB12, FOLATE, FERRITIN, TIBC, IRON, RETICCTPCT in the last 72 hours. Sepsis Labs: No results for  input(s): PROCALCITON, LATICACIDVEN in the last 168 hours.  No results found for this or any previous visit (from the past 240 hour(s)).       Radiology Studies: Dg Chest 2 View  Result Date: 10/18/2017 CLINICAL DATA:  Shortness of breath.  COPD. EXAM: CHEST - 2 VIEW COMPARISON:  10/03/2017. FINDINGS: Mediastinum and hilar structures normal. Cardiomegaly with normal pulmonary vascularity. No acute infiltrates. Low lung volumes with mild basilar atelectasis. No pleural effusion or pneumothorax. IMPRESSION: Cardiomegaly. No pulmonary venous congestion. No acute infiltrates. Mild bibasilar subsegmental atelectasis. Electronically Signed   By: Maisie Fus  Register   On: 10/18/2017 14:24        Scheduled Meds: . amLODipine  10 mg Oral Daily  . aspirin EC  81 mg Oral Daily  . budesonide (PULMICORT) nebulizer solution  0.5 mg Nebulization BID  . calcitRIOL  0.25 mcg Oral QODAY  . docusate sodium  100 mg Oral BID  . doxycycline  100 mg Oral Q12H  . enoxaparin (LOVENOX) injection  30 mg Subcutaneous QHS  . fluticasone  2 spray Each Nare Daily  . insulin glargine  14 Units Subcutaneous QHS  . ipratropium-albuterol  3 mL Nebulization QID  . isosorbide mononitrate  30 mg Oral Daily  . loratadine  10 mg Oral Daily  . methylPREDNISolone (SOLU-MEDROL) injection  60 mg Intravenous Q12H  . pantoprazole  40 mg Oral Daily  . terbinafine  1 application Topical BID   Continuous Infusions:   LOS: 0 days    Time spent: 35 minutes    Ramiro Harvest, MD [facility] Pager [phone]  If 7PM-7AM, please contact night-coverage [email or web address] Password [email or web address] 10/19/2017, 1:28 PM

## 2017-10-19 NOTE — Progress Notes (Signed)
Nutrition Brief Note  Patient identified on the Malnutrition Screening Tool (MST) Report and consult received per COPD Gold Protocol.   Wt Readings from Last 15 Encounters:  10/18/17 187 lb (84.8 kg)  10/12/17 187 lb (84.8 kg)  08/22/17 195 lb 12.3 oz (88.8 kg)  08/06/17 193 lb (87.5 kg)  05/01/17 194 lb (88 kg)  01/18/17 196 lb 6.4 oz (89.1 kg)  10/17/16 191 lb (86.6 kg)  09/11/16 187 lb 11.2 oz (85.1 kg)  05/01/16 190 lb 3.2 oz (86.3 kg)  04/11/16 184 lb 4.9 oz (83.6 kg)  03/18/16 194 lb 3.6 oz (88.1 kg)  02/15/16 199 lb (90.3 kg)  01/11/16 196 lb 6.4 oz (89.1 kg)  09/22/15 186 lb 3.2 oz (84.5 kg)  09/14/15 183 lb 6.8 oz (83.2 kg)    Body mass index is 29.29 kg/m. Patient meets criteria for overweight based on current BMI. Pt has lost 8 lbs (4% body weight) in the past 2 months. This is not significant for time frame.   Pt with hx of COPD, CKD, CHF, HTN, and GERD. He arrived to the ED with SOB and a rash. Reports SOB has been ongoing and worsening x1 year and nothing he has tried has improved his rash and it is worsening.   Current diet order is Renal with 1.2L fluid restriction. Pt consumed 100% of breakfast this AM (675 kcal, 27 grams of protein). Order in place for Nepro TID PRN, each supplement provide 440 kcal and 19 grams of protein. Will decrease to once/day PRN.  Medications reviewed; 100 mg Colace BID, 14 units Lantus/day, 2 g IV Mg sulfate x1 run yesterday, 125 mg Solumedrol x1 yesterday, 60 mg Solu-medrol BID. Labs reviewed; BUN: 25 mg/dL, creatinine: 7.82 mg/dL, GFR: 23 mL/min.   No nutrition interventions warranted at this time. If nutrition issues arise, please consult RD.     Trenton Gammon, MS, RD, LDN, [facility] Inpatient Clinical Dietitian Pager # [phone] After hours/weekend pager # [phone]

## 2017-10-19 NOTE — Telephone Encounter (Signed)
Verbal orders called as directed. Patient is currently admitted but she will keep orders if needed once patient is discharged.

## 2017-10-19 NOTE — Evaluation (Signed)
Physical Therapy Evaluation Patient Details Name: Greg Jones MRN: [id number] DOB: Aug 27, 1927 Today's Date: 10/19/2017   History of Present Illness  Pt was admitted with COPD with acute exacerbation.  PMH:  CKD, CHF, HTN, DM  Clinical Impression  Pt admitted with above diagnosis. Pt currently with functional limitations due to the deficits listed below (see PT Problem List).  Pt is limited by dyspnea, unable to tol amb >15'; SpO2=96-97% on RA, HR 80s  Pt will benefit from skilled PT to increase their independence and safety with mobility to allow discharge to the venue listed below.       Follow Up Recommendations Home health PT;Supervision for mobility/OOB    Equipment Recommendations  None recommended by PT    Recommendations for Other Services       Precautions / Restrictions Precautions Precautions: Fall Restrictions Weight Bearing Restrictions: No      Mobility  Bed Mobility Overal bed mobility: Needs Assistance Bed Mobility: Supine to Sit     Supine to sit: Min assist     General bed mobility comments: in chair  Transfers Overall transfer level: Needs assistance Equipment used: 4-wheeled walker Transfers: Sit to/from Stand Sit to Stand: Min guard Stand pivot transfers: Min assist       General transfer comment: cues for hand placement and to control descent  Ambulation/Gait Ambulation/Gait assistance: Min assist;Min guard Ambulation Distance (Feet): 15 Feet(x2) Assistive device: 4-wheeled walker Gait Pattern/deviations: Step-through pattern;Decreased stride length;Trunk flexed     General Gait Details: cues for safety with rollator; pt is extremely dyspneic with activity, O2 sats 96-97% on RA; unable to amb further than to/from bathroom  Stairs            Wheelchair Mobility    Modified Rankin (Stroke Patients Only)       Balance Overall balance assessment: Needs assistance(denies hx of falls)   Sitting balance-Leahy Scale: Fair        Standing balance-Leahy Scale: Poor Standing balance comment: reliant upon RW; uses rollator at home                             Pertinent Vitals/Pain Pain Assessment: No/denies pain    Home Living Family/patient expects to be discharged to:: Private residence Living Arrangements: Other relatives Available Help at Discharge: (sister)   Home Access: Level entry     Home Layout: One level Home Equipment: Environmental consultant - 4 wheels;Shower seat Additional Comments: has BSC but doesn't use it. Has seat in shower    Prior Function Level of Independence: Independent with assistive device(s)               Hand Dominance        Extremity/Trunk Assessment   Upper Extremity Assessment Upper Extremity Assessment: Generalized weakness;Defer to OT evaluation    Lower Extremity Assessment Lower Extremity Assessment: Generalized weakness       Communication   Communication: No difficulties  Cognition Arousal/Alertness: Awake/alert Behavior During Therapy: WFL for tasks assessed/performed Overall Cognitive Status: No family/caregiver present to determine baseline cognitive functioning                                 General Comments: pt is oriented, answers questions appropriately, follows commands consistently during PT eval; pt cognitive status not different than baseline per sister (?); requires safety cues with RW in small space of bathroom  General Comments      Exercises     Assessment/Plan    PT Assessment Patient needs continued PT services  PT Problem List Decreased strength;Decreased mobility;Decreased activity tolerance;Decreased balance;Decreased safety awareness       PT Treatment Interventions DME instruction;Functional mobility training;Therapeutic activities;Therapeutic exercise;Patient/family education;Gait training    PT Goals (Current goals can be found in the Care Plan section)  Acute Rehab PT Goals Patient Stated  Goal: none stated; agreeable to OOB PT Goal Formulation: With patient Time For Goal Achievement: 11/02/17 Potential to Achieve Goals: Good    Frequency Min 3X/week   Barriers to discharge        Co-evaluation               AM-PAC PT "6 Clicks" Daily Activity  Outcome Measure Difficulty turning over in bed (including adjusting bedclothes, sheets and blankets)?: Unable Difficulty moving from lying on back to sitting on the side of the bed? : Unable Difficulty sitting down on and standing up from a chair with arms (e.g., wheelchair, bedside commode, etc,.)?: Unable Help needed moving to and from a bed to chair (including a wheelchair)?: A Little Help needed walking in hospital room?: A Little Help needed climbing 3-5 steps with a railing? : A Lot 6 Click Score: 11    End of Session Equipment Utilized During Treatment: Gait belt Activity Tolerance: Patient limited by fatigue Patient left: with call bell/phone within reach;in chair;with chair alarm set;with family/visitor present Nurse Communication: Mobility status PT Visit Diagnosis: Difficulty in walking, not elsewhere classified (R26.2);Muscle weakness (generalized) (M62.81)    Time: 1610-9604 PT Time Calculation (min) (ACUTE ONLY): 24 min   Charges:   PT Evaluation $PT Eval Low Complexity: 1 Low PT Treatments $Gait Training: 8-22 mins   PT G CodesDrucilla Chalet, PT Pager: [phone] 10/19/2017   Drucilla Chalet 10/19/2017, 2:22 PM

## 2017-10-19 NOTE — Progress Notes (Signed)
Spoke with patient at bedside, he states he has [facility] for nurse and therapy, he has an aide from [facility] that comes in once per week for about 4 hours to assist with ADL's. He has a RW, handicap accessible bathroom. No DME needs. Contacted [facility] to notify them of admission, awaiting orders to resume HH. Will continue to follow for d/c needs. [phone]

## 2017-10-20 DIAGNOSIS — R5381 Other malaise: Secondary | ICD-10-CM

## 2017-10-20 DIAGNOSIS — J441 Chronic obstructive pulmonary disease with (acute) exacerbation: Principal | ICD-10-CM

## 2017-10-20 LAB — CBC
HEMATOCRIT: 36.3 % — AB (ref 39.0–52.0)
HEMOGLOBIN: 11.6 g/dL — AB (ref 13.0–17.0)
MCH: 27.4 pg (ref 26.0–34.0)
MCHC: 32 g/dL (ref 30.0–36.0)
MCV: 85.8 fL (ref 78.0–100.0)
PLATELETS: 266 10*3/uL (ref 150–400)
RBC: 4.23 MIL/uL (ref 4.22–5.81)
RDW: 17.6 % — ABNORMAL HIGH (ref 11.5–15.5)
WBC: 17.3 10*3/uL — AB (ref 4.0–10.5)

## 2017-10-20 LAB — BASIC METABOLIC PANEL
ANION GAP: 15 (ref 5–15)
BUN: 37 mg/dL — ABNORMAL HIGH (ref 6–20)
CHLORIDE: 99 mmol/L — AB (ref 101–111)
CO2: 26 mmol/L (ref 22–32)
Calcium: 9.5 mg/dL (ref 8.9–10.3)
Creatinine, Ser: 2.3 mg/dL — ABNORMAL HIGH (ref 0.61–1.24)
GFR calc Af Amer: 28 mL/min — ABNORMAL LOW (ref >60)
GFR, EST NON AFRICAN AMERICAN: 24 mL/min — AB (ref >60)
Glucose, Bld: 178 mg/dL — ABNORMAL HIGH (ref 65–99)
POTASSIUM: 4 mmol/L (ref 3.5–5.1)
Sodium: 140 mmol/L (ref 135–145)

## 2017-10-20 LAB — BLOOD GAS, ARTERIAL
ACID-BASE EXCESS: 3.3 mmol/L — AB (ref 0.0–2.0)
BICARBONATE: 25.8 mmol/L (ref 20.0–28.0)
DRAWN BY: 270211
FIO2: 0.21
O2 Saturation: 94.4 %
PCO2 ART: 33.4 mmHg (ref 32.0–48.0)
PH ART: 7.5 — AB (ref 7.350–7.450)
PO2 ART: 74.9 mmHg — AB (ref 83.0–108.0)
Patient temperature: 98.6

## 2017-10-20 LAB — GLUCOSE, CAPILLARY
GLUCOSE-CAPILLARY: 138 mg/dL — AB (ref 65–99)
Glucose-Capillary: 179 mg/dL — ABNORMAL HIGH (ref 65–99)
Glucose-Capillary: 235 mg/dL — ABNORMAL HIGH (ref 65–99)
Glucose-Capillary: 165 mg/dL — ABNORMAL HIGH (ref 65–99)

## 2017-10-20 LAB — MAGNESIUM: Magnesium: 2.6 mg/dL — ABNORMAL HIGH (ref 1.7–2.4)

## 2017-10-20 MED ORDER — FUROSEMIDE 40 MG PO TABS
80.0000 mg | ORAL_TABLET | Freq: Every day | ORAL | Status: DC
  Administered 2017-10-20 – 2017-10-21 (×2): 80 mg via ORAL
  Filled 2017-10-20 (×2): qty 2

## 2017-10-20 NOTE — Consult Note (Signed)
PULMONARY / CRITICAL CARE MEDICINE   Name: Greg Jones MRN: [id number] DOB: May 06, 1928    ADMISSION DATE:  10/18/2017 CONSULTATION DATE: Oct 20, 2017  REFERRING MD: Dr. Janee Morn  CHIEF COMPLAINT: Dyspnea  HISTORY OF PRESENT ILLNESS:   This is a pleasant 84 year old male who is followed by my partner for severe COPD who happens to be prednisone dependent who was admitted for a COPD exacerbation.  He tells me that he is coughing up more mucus than normal and it is typically yellow and nonbloody.  He has not had a fever recently.  He has had worsening shortness of breath over the last week and has been using his nebulizer for immediate relief bronchodilators more frequently.  He says he is using them about 3-4 times a day.  He has been compliant with Symbicort and Spiriva.  He denies recent sick contacts.  He does not use oxygen at home.  Since hospitalized he has been treated with breathing treatments and Solu-Medrol.  It was felt that he may be ready to go home today but when he ambulated he felt severely short of breath.  He tells me that he had some leg swelling last week but that has improved.  He denies any acid reflux symptoms right now.  He says that he had some postnasal drip but that is better since using a nose spray recently.  He says that he lives at home with his sister who is in her 53s.  At baseline he does not walk much.  He never goes to the grocery store.  He says that he stays in the bed most of the time.  He does work with a physical therapist at home from time to time.  In reviewing my partners records it shows that he has had severe COPD for many years.  He quit smoking cigarettes at age 17 after starting smoking at age 58.  Lung function testing summarized below.  PFTs 10/2014 showed FEV1 of 0.84-39%, FVC of 2.1-67%, without significant broncho-dilator response. DLCO was relatively preserved at 73%.  04/2015 Spirometry showed FEV1 of 34%-0.81    PAST MEDICAL HISTORY :   He  has a past medical history of CHF (congestive heart failure) (HCC), Chronic kidney disease, COPD (chronic obstructive pulmonary disease) (HCC), GERD (gastroesophageal reflux disease), Hypertension, and Respiratory insufficiency.  PAST SURGICAL HISTORY: He  has no past surgical history on file.  Allergies  Allergen Reactions  . Other Other (See Comments)    Oxygen Sensitive  . Penicillins Rash and Other (See Comments)    Any -illins as well as Mycins Has patient had a PCN reaction causing immediate rash, facial/tongue/throat swelling, SOB or lightheadedness with hypotension: Yes Has patient had a PCN reaction causing severe rash involving mucus membranes or skin necrosis: Yes Has patient had a PCN reaction that required hospitalization No Has patient had a PCN reaction occurring within the last 10 years: No If all of the above answers are "NO", then may proceed with Cephalosporin use.   . Vitamin B12 Itching, Rash and Other (See Comments)    injections    No current facility-administered medications on file prior to encounter.    Current Outpatient Medications on File Prior to Encounter  Medication Sig  . albuterol (PROVENTIL HFA;VENTOLIN HFA) 108 (90 Base) MCG/ACT inhaler Inhale 2 puffs into the lungs every 6 (six) hours as needed for wheezing or shortness of breath.  [name] albuterol (PROVENTIL) (2.5 MG/3ML) 0.083% nebulizer solution Take 3 mLs (2.5 mg total) by  nebulization every 6 (six) hours. DX:  J44.9  . amLODipine (NORVASC) 5 MG tablet Take 1 tablet (5 mg total) by mouth daily. (Patient taking differently: Take 10 mg by mouth daily. )  . aspirin EC 81 MG tablet Take 81 mg by mouth daily.  . budesonide-formoterol (SYMBICORT) 160-4.5 MCG/ACT inhaler Inhale 2 puffs into the lungs 2 (two) times daily.  . calcitRIOL (ROCALTROL) 0.25 MCG capsule Take 0.25 mcg by mouth every other day.  . camphor-menthol (SARNA) lotion Apply 1 application topically as needed for itching.  .  furosemide (LASIX) 40 MG tablet Take 80 mg by mouth daily.   . hydrOXYzine (ATARAX/VISTARIL) 25 MG tablet Take 1 tablet (25 mg total) by mouth 2 (two) times daily as needed for itching.  . insulin glargine (LANTUS) 100 UNIT/ML injection Inject 14 Units into the skin at bedtime.  . isosorbide mononitrate (IMDUR) 30 MG 24 hr tablet Take 30 mg by mouth daily.  [name] omeprazole (PRILOSEC) 20 MG capsule Take 20 mg by mouth daily.  . predniSONE (DELTASONE) 10 MG tablet Take 10 mg by mouth daily with breakfast.  . terbinafine (LAMISIL AT) 1 % cream Apply 1 application topically 2 (two) times daily.  [name] tiotropium (SPIRIVA) 18 MCG inhalation capsule Place 18 mcg into inhaler and inhale daily.  [name] triamcinolone cream (KENALOG) 0.1 % Apply 1 application topically 2 (two) times daily.    FAMILY HISTORY:  His indicated that his mother is deceased. He indicated that his father is deceased. He indicated that his sister is alive.   SOCIAL HISTORY: He  reports that he quit smoking about 4 years ago. His smoking use included cigarettes. He has a 35.00 pack-year smoking history. He has never used smokeless tobacco. He reports that he does not drink alcohol or use drugs.  REVIEW OF SYSTEMS:   Gen: Denies fever, chills, weight change, fatigue, night sweats HEENT: Denies blurred vision, double vision, hearing loss, tinnitus, sinus congestion, rhinorrhea, sore throat, neck stiffness, dysphagia PULM: per HPI CV: Denies chest pain, edema, orthopnea, paroxysmal nocturnal dyspnea, palpitations GI: Denies abdominal pain, nausea, vomiting, diarrhea, hematochezia, melena, constipation, change in bowel habits GU: Denies dysuria, hematuria, polyuria, oliguria, urethral discharge Endocrine: Denies hot or cold intolerance, polyuria, polyphagia or appetite change Derm: Denies rash, dry skin, scaling or peeling skin change Heme: Denies easy bruising, bleeding, bleeding gums Neuro: Denies headache, numbness, weakness, slurred  speech, loss of memory or consciousness   SUBJECTIVE:  As above  VITAL SIGNS: BP 132/76 (BP Location: Left Arm)   Pulse 91   Temp (!) 97.5 F (36.4 C) (Oral)   Resp 18   Ht  (1.702 m)   Wt 187 lb (84.8 kg)   SpO2 95%   BMI 29.29 kg/m   HEMODYNAMICS:    VENTILATOR SETTINGS:    INTAKE / OUTPUT: I/O last 3 completed shifts: In: 1280 [P.O.:1230; IV Piggyback:50] Out: 625 [Urine:625]  PHYSICAL EXAMINATION:  General:  Resting comfortably in bed HENT: NCAT OP clear PULM: Wheezing throughout bilaterally B, normal effort CV: RRR, no mgr GI: BS+, soft, nontender MSK: normal bulk and tone Neuro: awake, alert, no distress, MAEW   LABS:  BMET Recent Labs  Lab 10/18/17 1611 10/19/17 0559 10/20/17 0517  NA 147* 144 140  K 3.5 3.7 4.0  CL 103 104 99*  CO2 BUN 22* 25* 37*  CREATININE 2.47* 2.34* 2.30*  GLUCOSE 131* 137* 178*    Electrolytes Recent Labs  Lab 10/18/17 1611 10/19/17 0559  10/20/17 0517  CALCIUM 9.9 9.2 9.5  MG 2.2  --  2.6*    CBC Recent Labs  Lab 10/18/17 1611 10/19/17 0559 10/20/17 0517  WBC 14.3* 12.6* 17.3*  HGB 15.9 12.5* 11.6*  HCT 48.5 39.6 36.3*  PLT 292 258 266    Coag's No results for input(s): APTT, INR in the last 168 hours.  Sepsis Markers No results for input(s): LATICACIDVEN, PROCALCITON, O2SATVEN in the last 168 hours.  ABG Recent Labs  Lab 10/20/17 1040  PHART 7.500*  PCO2ART 33.4  PO2ART 74.9*    Liver Enzymes No results for input(s): AST, ALT, ALKPHOS, BILITOT, ALBUMIN in the last 168 hours.  Cardiac Enzymes No results for input(s): TROPONINI, PROBNP in the last 168 hours.  Glucose Recent Labs  Lab 10/19/17 0001 10/19/17 1806 10/19/17 2119 10/20/17 0748 10/20/17 1157 10/20/17 1605  GLUCAP 150* 156* 185* 179* 138* 235*    Imaging No results found.     DISCUSSION: 84 year old male who is quite deconditioned with severe COPD now here with a COPD exacerbation.  I worry that  this COPD exacerbation may have pushed him over the edge to the point where he needs to go to an inpatient rehab facility to get stronger.  The only gap in his care so far that I can see is a lack of a long-acting beta agonist so I will add that back.  However, considering the diffuse rash that he has and his prednisone dependence at baseline I have the sense that there is likely an allergic component contributing to this disease and I suspect this will be difficult to treat.  ASSESSMENT / PLAN:  A: Acute exacerbation of COPD with eosinophilia and a rash, prednisone dependent Severe airflow obstruction based on 2015 lung function testing Physical deconditioning P:   Add Brovana twice a day Continue pulmicort Continue Solu-Medrol as you are doing Continue short acting bronchodilators with ipratropium and albuterol as you are doing Ambulate as able Monitor O2 saturation, goal is to be above 88% Needs pulmonary rehab at a minimum Would assess his dyspnea on exertion on May 5.  If he is still short of breath with performing activities of daily living and minimal activity it may be best for him to go to a short-term rehab facility. Needs to taper prednisone at home as follows: Take  po daily for 3 days, then take  po daily for 3 days, then take  po daily for two days, then resume prednisone  daily Resume symbicort 160 and spiriva at home at hospital discharge   Heber Westland, MD [facility] Pager: [phone] Cell: [phone] After 3pm or if no response, call [phone]   10/20/2017, 5:39 PM

## 2017-10-20 NOTE — Plan of Care (Signed)
Plan of care discussed.   

## 2017-10-20 NOTE — Progress Notes (Signed)
PROGRESS NOTE    Davyn Morandi  ZOX:[id number] DOB: Oct 09, 1927 DOA: 10/18/2017 PCP: Deeann Saint, MD    Brief Narrative:  Greg Jones is a 84 y.o. male with medical history significant of COPD; CKD; CHF; HTN; and GERD presenting with SOB.  He reports that he is here for SOB and also a rash that he is supposed to be seeing a doctor for Monday, that is not getting any better, breaking out even in his hair, neck, back.  SOB for over a year, seems to be getting shorter.  He is coughing very little, occasionally productive of whitish sputum.  No fever.  +wheezing.  +tobacco remotely, quit 4 years ago after smoking for 70 years (about 1/2 ppd).  The rash started about a month ago.  He has about 10 different creams - they occasionally stop the itching but the rash does not change and is getting worse.  Weight has been stable.  ED Course:  Copd worsening for a few weeks.  Tachypnea, pursed lip breathing.  Solumedrol, nebs.  Needs admission.  Has erythematous rash from torso down, being treated for fungal infection.     Assessment & Plan:   Principal Problem:   COPD with acute exacerbation (HCC) Active Problems:   Essential hypertension   Chronic combined systolic and diastolic CHF (congestive heart failure) (HCC)   CKD (chronic kidney disease), stage IV (HCC)   Type 2 diabetes mellitus with other specified complication (HCC)  #1 acute COPD exacerbation Patient presented with mild acute COPD exacerbation with and wheezing Some worsening shortness of breath that has chronically been getting worse over the past year.  Patient noted to have a history of severe COPD not on home O2 per patient. Patient noted on minimal ambulation to be significantly short of breath my use of accessory muscles of respiration and wheezing.  Patient also noted to have some upper airway noise.  Continue current regimen of IV Solu-Medrol, scheduled nebs, PPI, doxycycline, Pulmicort, Claritin, Flonase.  We will add  Brovana.  Check a ABG.  Due to acute worsening of symptoms on minimal ambulation will consult with pulmonary for further evaluation and management.   2.  Hypertension Blood Pressure well controlled at this time.  Continue Norvasc and imdur.  Will resume home dose diuretics monitor.  3.  Chronic kidney disease stage IV Stable.  4.  Type 2 diabetes Hemoglobin A1c was 8.1 on 08/18/2017.  CBGs 179.  Continue Lantus.  Continue sliding scale insulin.    5.  Chronic combined systolic and diastolic CHF 2D echo from 08/15/2017 with a EF of 45 to 50% with grade 1 diastolic dysfunction.  Compensated.  Resume home regiment diuretics.    6 pruritic rash Patient with a progressive pruritic skin rash.  Lasix has been resumed.  Continue IV steroids.  Continue Vistaril as needed for itching.  Patient noted to have a dermatology appointment on Monday, Oct 22, 2017.  Outpatient follow-up with dermatology.   DVT prophylaxis: Lovenox Code Status: Full Family Communication: updated patient.  No family at bedside. Disposition Plan: To be determined likely home with home health once back to baseline and after evaluation by pulmonary.     Consultants:   None  Procedures:   Chest x-ray 10/18/2017  Antimicrobials:   Doxycycline 10/19/2017   Subjective: Patient on minimal ambulation with RN with walker with significant worsening shortness of breath, increasing wheezing with some use of accessory muscles of respiration.  During ambulation however O2 sats greater than 93%.  Patient denies any chest pain.   Objective: Vitals:   10/19/17 2116 10/20/17 0635 10/20/17 0820 10/20/17 0905  BP: 128/74 140/68  132/76  Pulse: 88 76  91  Resp: 18 18    Temp: 98.9 F (37.2 C) (!) 97.5 F (36.4 C)    TempSrc:  Oral    SpO2: 95% 94% 94% 99%  Weight:      Height:        Intake/Output Summary (Last 24 hours) at 10/20/2017 1003 Last data filed at 10/20/2017 0600 Gross per 24 hour  Intake 660 ml  Output 200 ml  Net  460 ml   Filed Weights   10/18/17 1721  Weight: 84.8 kg (187 lb)    Examination:  General exam: Acutely short of breath with minimal ambulation and some use of accessory muscles of respiration. Respiratory system: mild expiratory wheezing.  Use of accessory muscles of respiration after minimal ambulation.  No rhonchi.  No crackles.  Upper airway noise. Cardiovascular system: Regular rate and rhythm no murmurs rubs or gallops.  No JVD.  No lower extremity edema.  Gastrointestinal system: Abdomen is nontender, nondistended, positive bowel sounds.   Central nervous system: Alert and oriented. No focal neurological deficits. Extremities: Symmetric 5 x 5 power. Skin: Diffuse rash on torso and abdomen with some confluence on the chest and some patchy areas on the abdomen.  Pruritic.  Psychiatry: Judgement and insight appear normal. Mood & affect appropriate.     Data Reviewed: I have personally reviewed following labs and imaging studies  CBC: Recent Labs  Lab 10/18/17 1611 10/19/17 0559 10/20/17 0517  WBC 14.3* 12.6* 17.3*  NEUTROABS 11.1*  --   --   HGB 15.9 12.5* 11.6*  HCT 48.5 39.6 36.3*  MCV 86.8 86.1 85.8  PLT 292 258 266   Basic Metabolic Panel: Recent Labs  Lab 10/18/17 1611 10/19/17 0559 10/20/17 0517  NA 147* 144 140  K 3.5 3.7 4.0  CL 103 104 99*  CO2 GLUCOSE 131* 137* 178*  BUN 22* 25* 37*  CREATININE 2.47* 2.34* 2.30*  CALCIUM 9.9 9.2 9.5  MG 2.2  --  2.6*   GFR: Estimated Creatinine Clearance: 23.1 mL/min (A) (by C-G formula based on SCr of 2.3 mg/dL (H)). Liver Function Tests: No results for input(s): AST, ALT, ALKPHOS, BILITOT, PROT, ALBUMIN in the last 168 hours. No results for input(s): LIPASE, AMYLASE in the last 168 hours. No results for input(s): AMMONIA in the last 168 hours. Coagulation Profile: No results for input(s): INR, PROTIME in the last 168 hours. Cardiac Enzymes: No results for input(s): CKTOTAL, CKMB, CKMBINDEX,  TROPONINI in the last 168 hours. BNP (last 3 results) No results for input(s): PROBNP in the last 8760 hours. HbA1C: No results for input(s): HGBA1C in the last 72 hours. CBG: Recent Labs  Lab 10/19/17 0001 10/19/17 1806 10/19/17 2119 10/20/17 0748  GLUCAP 150* 156* 185* 179*   Lipid Profile: No results for input(s): CHOL, HDL, LDLCALC, TRIG, CHOLHDL, LDLDIRECT in the last 72 hours. Thyroid Function Tests: No results for input(s): TSH, T4TOTAL, FREET4, T3FREE, THYROIDAB in the last 72 hours. Anemia Panel: No results for input(s): VITAMINB12, FOLATE, FERRITIN, TIBC, IRON, RETICCTPCT in the last 72 hours. Sepsis Labs: No results for input(s): PROCALCITON, LATICACIDVEN in the last 168 hours.  No results found for this or any previous visit (from the past 240 hour(s)).       Radiology Studies: Dg Chest 2 View  Result Date: 10/18/2017  CLINICAL DATA:  Shortness of breath.  COPD. EXAM: CHEST - 2 VIEW COMPARISON:  10/03/2017. FINDINGS: Mediastinum and hilar structures normal. Cardiomegaly with normal pulmonary vascularity. No acute infiltrates. Low lung volumes with mild basilar atelectasis. No pleural effusion or pneumothorax. IMPRESSION: Cardiomegaly. No pulmonary venous congestion. No acute infiltrates. Mild bibasilar subsegmental atelectasis. Electronically Signed   By: Maisie Fus  Register   On: 10/18/2017 14:24        Scheduled Meds: . amLODipine  10 mg Oral Daily  . aspirin EC  81 mg Oral Daily  . budesonide (PULMICORT) nebulizer solution  0.5 mg Nebulization BID  . calcitRIOL  0.25 mcg Oral QODAY  . docusate sodium  100 mg Oral BID  . doxycycline  100 mg Oral Q12H  . enoxaparin (LOVENOX) injection  30 mg Subcutaneous QHS  . fluticasone  2 spray Each Nare Daily  . furosemide  80 mg Oral Daily  . insulin aspart  0-20 Units Subcutaneous TID WC  . insulin aspart  0-5 Units Subcutaneous QHS  . insulin glargine  14 Units Subcutaneous QHS  . ipratropium-albuterol  3 mL  Nebulization QID  . isosorbide mononitrate  30 mg Oral Daily  . loratadine  10 mg Oral Daily  . methylPREDNISolone (SOLU-MEDROL) injection  60 mg Intravenous Q12H  . pantoprazole  40 mg Oral Daily  . terbinafine  1 application Topical BID   Continuous Infusions:   LOS: 1 day    Time spent: 35 minutes    Ramiro Harvest, MD [facility] Pager [phone]  If 7PM-7AM, please contact night-coverage [email or web address] Password [email or web address] 10/20/2017, 10:03 AM

## 2017-10-20 NOTE — Progress Notes (Signed)
Ambulated patient in hallway with assistance  x 2 approximatly 85 feet. Oxygen level 96 % @ rest,  94% while ambulating but briefly dropped to 83-84%, patient allowed to rest and oxygen level returned to 94% on room air. Patient states he does not normally walk that much at home Patient assisted back to bed, continuous pulse applied  respiratory therapy notified for prn breathing treatment.

## 2017-10-21 ENCOUNTER — Inpatient Hospital Stay (HOSPITAL_COMMUNITY): Payer: Medicare Other

## 2017-10-21 LAB — BASIC METABOLIC PANEL
Anion gap: 15 (ref 5–15)
BUN: 43 mg/dL — AB (ref 6–20)
CO2: 23 mmol/L (ref 22–32)
CREATININE: 2.41 mg/dL — AB (ref 0.61–1.24)
Calcium: 9.3 mg/dL (ref 8.9–10.3)
Chloride: 100 mmol/L — ABNORMAL LOW (ref 101–111)
GFR calc Af Amer: 26 mL/min — ABNORMAL LOW (ref >60)
GFR calc non Af Amer: 22 mL/min — ABNORMAL LOW (ref >60)
Glucose, Bld: 204 mg/dL — ABNORMAL HIGH (ref 65–99)
Potassium: 3.8 mmol/L (ref 3.5–5.1)
SODIUM: 138 mmol/L (ref 135–145)

## 2017-10-21 LAB — CBC WITH DIFFERENTIAL/PLATELET
Basophils Absolute: 0 10*3/uL (ref 0.0–0.1)
Basophils Relative: 0 %
EOS ABS: 0 10*3/uL (ref 0.0–0.7)
EOS PCT: 0 %
HCT: 39.5 % (ref 39.0–52.0)
Hemoglobin: 12.5 g/dL — ABNORMAL LOW (ref 13.0–17.0)
LYMPHS ABS: 0.6 10*3/uL — AB (ref 0.7–4.0)
Lymphocytes Relative: 5 %
MCH: 27 pg (ref 26.0–34.0)
MCHC: 31.6 g/dL (ref 30.0–36.0)
MCV: 85.3 fL (ref 78.0–100.0)
MONO ABS: 1 10*3/uL (ref 0.1–1.0)
Monocytes Relative: 8 %
NEUTROS ABS: 11.8 10*3/uL — AB (ref 1.7–7.7)
NEUTROS PCT: 87 %
Platelets: 259 10*3/uL (ref 150–400)
RBC: 4.63 MIL/uL (ref 4.22–5.81)
RDW: 17.5 % — AB (ref 11.5–15.5)
WBC: 13.5 10*3/uL — ABNORMAL HIGH (ref 4.0–10.5)

## 2017-10-21 LAB — GLUCOSE, CAPILLARY
GLUCOSE-CAPILLARY: 199 mg/dL — AB (ref 65–99)
GLUCOSE-CAPILLARY: 184 mg/dL — AB (ref 65–99)
Glucose-Capillary: 177 mg/dL — ABNORMAL HIGH (ref 65–99)
Glucose-Capillary: 246 mg/dL — ABNORMAL HIGH (ref 65–99)

## 2017-10-21 MED ORDER — POLYETHYLENE GLYCOL 3350 17 G PO PACK
17.0000 g | PACK | Freq: Two times a day (BID) | ORAL | Status: DC
  Administered 2017-10-21 – 2017-10-24 (×7): 17 g via ORAL
  Filled 2017-10-21 (×8): qty 1

## 2017-10-21 MED ORDER — FUROSEMIDE 10 MG/ML IJ SOLN
40.0000 mg | Freq: Once | INTRAMUSCULAR | Status: AC
  Administered 2017-10-21: 40 mg via INTRAVENOUS
  Filled 2017-10-21: qty 4

## 2017-10-21 MED ORDER — ARFORMOTEROL TARTRATE 15 MCG/2ML IN NEBU
15.0000 ug | INHALATION_SOLUTION | Freq: Two times a day (BID) | RESPIRATORY_TRACT | Status: DC
  Administered 2017-10-21 – 2017-10-25 (×9): 15 ug via RESPIRATORY_TRACT
  Filled 2017-10-21 (×10): qty 2

## 2017-10-21 MED ORDER — METHYLPREDNISOLONE SODIUM SUCC 125 MG IJ SOLR
60.0000 mg | INTRAMUSCULAR | Status: DC
  Administered 2017-10-22 – 2017-10-24 (×3): 60 mg via INTRAVENOUS
  Filled 2017-10-21 (×3): qty 2

## 2017-10-21 MED ORDER — FUROSEMIDE 10 MG/ML IJ SOLN
20.0000 mg | Freq: Once | INTRAMUSCULAR | Status: AC
  Administered 2017-10-21: 20 mg via INTRAVENOUS
  Filled 2017-10-21: qty 2

## 2017-10-21 NOTE — Plan of Care (Signed)
Plan of care discussed.   

## 2017-10-21 NOTE — Progress Notes (Signed)
PROGRESS NOTE    Greg Jones  WUJ:[id number] DOB: 1927/10/31 DOA: 10/18/2017 PCP: Deeann Saint, MD    Brief Narrative:  Greg Jones is a 84 y.o. male with medical history significant of COPD; CKD; CHF; HTN; and GERD presenting with SOB.  He reports that he is here for SOB and also a rash that he is supposed to be seeing a doctor for Monday, that is not getting any better, breaking out even in his hair, neck, back.  SOB for over a year, seems to be getting shorter.  He is coughing very little, occasionally productive of whitish sputum.  No fever.  +wheezing.  +tobacco remotely, quit 4 years ago after smoking for 70 years (about 1/2 ppd).  The rash started about a month ago.  He has about 10 different creams - they occasionally stop the itching but the rash does not change and is getting worse.  Weight has been stable.  ED Course:  Copd worsening for a few weeks.  Tachypnea, pursed lip breathing.  Solumedrol, nebs.  Needs admission.  Has erythematous rash from torso down, being treated for fungal infection.     Assessment & Plan:   Principal Problem:   COPD with acute exacerbation (HCC) Active Problems:   Essential hypertension   Chronic combined systolic and diastolic CHF (congestive heart failure) (HCC)   CKD (chronic kidney disease), stage IV (HCC)   Type 2 diabetes mellitus with other specified complication (HCC)  #1 acute COPD exacerbation Patient presented with mild acute COPD exacerbation with and wheezing Some worsening shortness of breath that has chronically been getting worse over the past year.  Patient noted to have a history of severe COPD not on home O2 per patient. Patient noted on minimal ambulation to be significantly short of breath my use of accessory muscles of respiration and wheezing.  Patient also noted to have some upper airway noise.  Continue current regimen of scheduled nebs, PPI, doxycycline, Pulmicort, Claritin, Flonase.  IV steroid taper.  Will  start Brovana.  We will give a dose of IV Lasix due to shortness of breath on minimal exertion.  Patient seen in consultation by pulmonary who recommended addition of Brovana, continuation of Pulmicort, steroid taper continuation of bronchodilators and goal to keep sats greater than 88%.  It was felt patient may benefit from short-term rehab facility on discharge.  Patient also need to follow-up with pulmonary in the outpatient setting.   2.  Hypertension Blood Pressure stable.  Continue current regimen of Norvasc and Imdur and diuretics.   3.  Chronic kidney disease stage IV Stable.  4.  Type 2 diabetes Hemoglobin A1c was 8.1 on 08/18/2017.  CBGs 199.  Continue Lantus.  Continue sliding scale insulin.    5.  Chronic combined systolic and diastolic CHF 2D echo from 08/15/2017 with a EF of 45 to 50% with grade 1 diastolic dysfunction.  Patient however with shortness of breath with minimal exertion per RN.  Patient also chronically debilitated.  Patient back on home regimen of Lasix 80 mg daily.  We will give a dose of Lasix IV x2, strict I's and O's.  Daily weights.  Follow.   6 pruritic rash Patient with a progressive pruritic skin rash.  Lasix has been resumed.  Continue IV steroids taper.  Continue Vistaril as needed for itching.  Patient noted to have a dermatology appointment on Monday, Oct 22, 2017.  Outpatient follow-up with dermatology.   DVT prophylaxis: Lovenox Code Status: Full Family Communication: updated  patient.  No family at bedside. Disposition Plan: Likely skilled nursing facility versus home with home health once improved and at baseline and when okay with pulmonary.     Consultants:   Pulmonary: Dr. Kendrick Fries 10/20/2017  Procedures:   Chest x-ray 10/18/2017  Antimicrobials:   Doxycycline 10/19/2017   Subjective: Patient states still with shortness of breath on minimal exertion that he noticed this morning.  Still with some wheezing.  Denies any chest pain.     Objective: Vitals:   10/20/17 2023 10/20/17 2102 10/21/17 0634 10/21/17 0842  BP:  139/69 (!) 141/71   Pulse:  90 86   Resp:  18    Temp:  98 F (36.7 C) 98 F (36.7 C)   TempSrc:  Oral Oral   SpO2: 94% 95% 94% 96%  Weight:      Height:        Intake/Output Summary (Last 24 hours) at 10/21/2017 1019 Last data filed at 10/21/2017 0900 Gross per 24 hour  Intake 1020 ml  Output 1050 ml  Net -30 ml   Filed Weights   10/18/17 1721  Weight: 84.8 kg (187 lb)    Examination:  General exam: Sitting up in bed.  Respiratory system: Lungs with some mild expiratory wheezing.  No rhonchi.  Poor to fair air movement.  Upper airway noise. Cardiovascular system: Regular rate and rhythm no murmurs rubs or gallops.  No JVD.  No lower extremity edema.  Gastrointestinal system: Abdomen is mildly distended, soft, nontender to palpation, positive bowel sounds.  Central nervous system: Alert and oriented. No focal neurological deficits. Extremities: Symmetric 5 x 5 power. Skin: Diffuse rash on torso and abdomen with some confluence on the chest and some patchy areas on the abdomen.  Pruritic.  Psychiatry: Judgement and insight appear normal. Mood & affect appropriate.     Data Reviewed: I have personally reviewed following labs and imaging studies  CBC: Recent Labs  Lab 10/18/17 1611 10/19/17 0559 10/20/17 0517 10/21/17 0508  WBC 14.3* 12.6* 17.3* 13.5*  NEUTROABS 11.1*  --   --  11.8*  HGB 15.9 12.5* 11.6* 12.5*  HCT 48.5 39.6 36.3* 39.5  MCV 86.8 86.1 85.8 85.3  PLT 292 258 266 259   Basic Metabolic Panel: Recent Labs  Lab 10/18/17 1611 10/19/17 0559 10/20/17 0517 10/21/17 0508  NA 147* 144 140 138  K 3.5 3.7 4.0 3.8  CL 103 104 99* 100*  CO2 GLUCOSE 131* 137* 178* 204*  BUN 22* 25* 37* 43*  CREATININE 2.47* 2.34* 2.30* 2.41*  CALCIUM 9.9 9.2 9.5 9.3  MG 2.2  --  2.6*  --    GFR: Estimated Creatinine Clearance: 22.1 mL/min (A) (by C-G formula based on  SCr of 2.41 mg/dL (H)). Liver Function Tests: No results for input(s): AST, ALT, ALKPHOS, BILITOT, PROT, ALBUMIN in the last 168 hours. No results for input(s): LIPASE, AMYLASE in the last 168 hours. No results for input(s): AMMONIA in the last 168 hours. Coagulation Profile: No results for input(s): INR, PROTIME in the last 168 hours. Cardiac Enzymes: No results for input(s): CKTOTAL, CKMB, CKMBINDEX, TROPONINI in the last 168 hours. BNP (last 3 results) No results for input(s): PROBNP in the last 8760 hours. HbA1C: No results for input(s): HGBA1C in the last 72 hours. CBG: Recent Labs  Lab 10/20/17 0748 10/20/17 1157 10/20/17 1605 10/20/17 2142 10/21/17 0737  GLUCAP 179* 138* 235* 165* 199*   Lipid Profile: No results for input(s): CHOL,  HDL, LDLCALC, TRIG, CHOLHDL, LDLDIRECT in the last 72 hours. Thyroid Function Tests: No results for input(s): TSH, T4TOTAL, FREET4, T3FREE, THYROIDAB in the last 72 hours. Anemia Panel: No results for input(s): VITAMINB12, FOLATE, FERRITIN, TIBC, IRON, RETICCTPCT in the last 72 hours. Sepsis Labs: No results for input(s): PROCALCITON, LATICACIDVEN in the last 168 hours.  No results found for this or any previous visit (from the past 240 hour(s)).       Radiology Studies: No results found.      Scheduled Meds: . amLODipine  10 mg Oral Daily  . arformoterol  15 mcg Nebulization BID  . aspirin EC  81 mg Oral Daily  . budesonide (PULMICORT) nebulizer solution  0.5 mg Nebulization BID  . calcitRIOL  0.25 mcg Oral QODAY  . docusate sodium  100 mg Oral BID  . doxycycline  100 mg Oral Q12H  . enoxaparin (LOVENOX) injection  30 mg Subcutaneous QHS  . fluticasone  2 spray Each Nare Daily  . furosemide  80 mg Oral Daily  . insulin aspart  0-20 Units Subcutaneous TID WC  . insulin aspart  0-5 Units Subcutaneous QHS  . insulin glargine  14 Units Subcutaneous QHS  . ipratropium-albuterol  3 mL Nebulization QID  . isosorbide mononitrate   30 mg Oral Daily  . loratadine  10 mg Oral Daily  . methylPREDNISolone (SOLU-MEDROL) injection  60 mg Intravenous Q12H  . pantoprazole  40 mg Oral Daily  . terbinafine  1 application Topical BID   Continuous Infusions:   LOS: 2 days    Time spent: 35 minutes    Ramiro Harvest, MD [facility] Pager [phone]  If 7PM-7AM, please contact night-coverage [email or web address] Password [email or web address] 10/21/2017, 10:19 AM

## 2017-10-21 NOTE — Care Management Note (Addendum)
10/20/2017 Discharge Planning: NCM spoke to pt at bedside. States he is active with [facility]. Notified [facility] of resumption of care. States he has RW, Product manager, and bedside commode. He lives at home with sister, Chukwuemeka Artola # [phone] . Will check with sister to see if he has neb machine. Isidoro Donning RN CCM Case Mgmt phone [phone]

## 2017-10-21 NOTE — NC FL2 (Signed)
[place] MEDICAID FL2 LEVEL OF CARE SCREENING TOOL     IDENTIFICATION  Patient Name: Greg Jones Birthdate: [birth year] Sex: male Admission Date (Current Location): 10/18/2017  [facility] and [place] Number:  Producer, television/film/video and Address:  [facility] [place], [address] [place], [place] [address]      Provider Number: [id number]  Attending Physician Name and Address:  Rodolph Bong, MD  Relative Name and Phone Number:  Clemons Salvucci, sister, [phone]    Current Level of Care: Hospital Recommended Level of Care: Skilled Nursing Facility Prior Approval Number:    Date Approved/Denied:   PASRR Number: [id number]  Discharge Plan: SNF    Current Diagnoses: Patient Active Problem List   Diagnosis Date Noted  . COPD with acute exacerbation (HCC) 10/18/2017  . Type 2 diabetes mellitus with other specified complication (HCC) 10/12/2017  . Pruritic rash 08/14/2017  . Pain and swelling of toe of left foot 08/14/2017  . Pressure injury of skin 04/12/2016  . Near syncope 04/11/2016  . Pre-syncope 04/11/2016  . CKD (chronic kidney disease), stage IV (HCC) 04/11/2016  . QT prolongation 04/11/2016  . Leukocytosis 04/11/2016  . Hypokalemia 04/11/2016  . Blood glucose elevated 04/11/2016  . Chronic combined systolic and diastolic CHF (congestive heart failure) (HCC) 01/11/2016  . Dyspnea   . COPD exacerbation (HCC) 09/07/2015  . GERD (gastroesophageal reflux disease) 05/28/2015  . COPD (chronic obstructive pulmonary disease) (HCC) 05/10/2015  . Essential hypertension 05/10/2015    Orientation RESPIRATION BLADDER Height & Weight     Self, Time, Situation, Place  Normal Continent Weight: 187 lb (84.8 kg) Height:   (170.2 cm)  BEHAVIORAL SYMPTOMS/MOOD NEUROLOGICAL BOWEL NUTRITION STATUS      Continent Diet(please see DC summary)  AMBULATORY STATUS COMMUNICATION OF NEEDS Skin   Limited Assist Verbally Normal                        Personal Care Assistance Level of Assistance  Bathing, Feeding, Dressing Bathing Assistance: Limited assistance Feeding assistance: Independent Dressing Assistance: Limited assistance     Functional Limitations Info  Sight, Hearing, Speech Sight Info: Adequate Hearing Info: Impaired Speech Info: Adequate    SPECIAL CARE FACTORS FREQUENCY  PT (By licensed PT), OT (By licensed OT)     PT Frequency: 5x/week OT Frequency: 5x/week            Contractures Contractures Info: Not present    Additional Factors Info  Code Status, Allergies, Insulin Sliding Scale Code Status Info: Full Allergies Info: Other, Penicillins, Vitamin B12   Insulin Sliding Scale Info: novolog 3x/day with meals and at bedtime, lantus at bedtime       Current Medications (10/21/2017):  This is the current hospital active medication list Current Facility-Administered Medications  Medication Dose Route Frequency Provider Last Rate Last Dose  . acetaminophen (TYLENOL) tablet 650 mg  650 mg Oral Q6H PRN Jonah Blue, MD       Or  . acetaminophen (TYLENOL) suppository 650 mg  650 mg Rectal Q6H PRN Jonah Blue, MD      . albuterol (PROVENTIL) (2.5 MG/3ML) 0.083% nebulizer solution 2.5 mg  2.5 mg Nebulization Q2H PRN Jonah Blue, MD   2.5 mg at 10/20/17 1025  . amLODipine (NORVASC) tablet 10 mg  10 mg Oral Daily Jonah Blue, MD   10 mg at 10/21/17 1034  . arformoterol (BROVANA) nebulizer solution 15 mcg  15 mcg Nebulization BID Rodolph Bong,  MD   15 mcg at 10/21/17 1151  . aspirin EC tablet 81 mg  81 mg Oral Daily Jonah Blue, MD   81 mg at 10/21/17 1033  . budesonide (PULMICORT) nebulizer solution 0.5 mg  0.5 mg Nebulization BID Rodolph Bong, MD   0.5 mg at 10/21/17 1610  . calcitRIOL (ROCALTROL) capsule 0.25 mcg  0.25 mcg Oral Heron Nay, MD   0.25 mcg at 10/21/17 1034  . calcium carbonate (dosed in mg elemental calcium) suspension 500 mg of elemental calcium  500  mg of elemental calcium Oral Q6H PRN Jonah Blue, MD      . camphor-menthol Wynelle Fanny) lotion 1 application  1 application Topical PRN Jonah Blue, MD      . docusate sodium (COLACE) capsule 100 mg  100 mg Oral BID Jonah Blue, MD   100 mg at 10/21/17 1034  . docusate sodium (ENEMEEZ) enema 283 mg  1 enema Rectal PRN Jonah Blue, MD      . doxycycline (VIBRA-TABS) tablet 100 mg  100 mg Oral Q12H Rodolph Bong, MD   100 mg at 10/21/17 1034  . enoxaparin (LOVENOX) injection 30 mg  30 mg Subcutaneous QHS Jonah Blue, MD   30 mg at 10/20/17 2157  . feeding supplement (NEPRO CARB STEADY) liquid 237 mL  237 mL Oral Daily PRN Rodolph Bong, MD      . fluticasone Aleda Grana) 50 MCG/ACT nasal spray 2 spray  2 spray Each Nare Daily Rodolph Bong, MD   2 spray at 10/21/17 1036  . furosemide (LASIX) tablet 80 mg  80 mg Oral Daily Rodolph Bong, MD   80 mg at 10/21/17 1034  . hydrOXYzine (ATARAX/VISTARIL) tablet 25 mg  25 mg Oral BID PRN Jonah Blue, MD   25 mg at 10/19/17 0002  . insulin aspart (novoLOG) injection 0-20 Units  0-20 Units Subcutaneous TID WC Rodolph Bong, MD   4 Units at 10/21/17 1256  . insulin aspart (novoLOG) injection 0-5 Units  0-5 Units Subcutaneous QHS Rodolph Bong, MD      . insulin glargine (LANTUS) injection 14 Units  14 Units Subcutaneous QHS Jonah Blue, MD   14 Units at 10/20/17 2156  . ipratropium-albuterol (DUONEB) 0.5-2.5 (3) MG/3ML nebulizer solution 3 mL  3 mL Nebulization QID Jonah Blue, MD   3 mL at 10/21/17 1151  . isosorbide mononitrate (IMDUR) 24 hr tablet 30 mg  30 mg Oral Daily Jonah Blue, MD   30 mg at 10/21/17 1035  . loratadine (CLARITIN) tablet 10 mg  10 mg Oral Daily Rodolph Bong, MD   10 mg at 10/21/17 1035  . methylPREDNISolone sodium succinate (SOLU-MEDROL) 125 mg/2 mL injection 60 mg  60 mg Intravenous Q12H Rodolph Bong, MD   60 mg at 10/21/17 1257  . ondansetron (ZOFRAN) tablet 4 mg  4  mg Oral Q6H PRN Jonah Blue, MD       Or  . ondansetron [facility]) injection 4 mg  4 mg Intravenous Q6H PRN Jonah Blue, MD      . pantoprazole (PROTONIX) EC tablet 40 mg  40 mg Oral Daily Jonah Blue, MD   40 mg at 10/21/17 1035  . sorbitol 70 % solution 30 mL  30 mL Oral PRN Jonah Blue, MD      . terbinafine (LAMISIL) 1 % cream 1 application  1 application Topical BID Jonah Blue, MD   1 application at 10/21/17 1041  . zolpidem (AMBIEN) tablet 5  mg  5 mg Oral QHS PRN Jonah Blue, MD         Discharge Medications: Please see discharge summary for a list of discharge medications.  Relevant Imaging Results:  Relevant Lab Results:   Additional Information SSN: [id number]  Abigail Butts, LCSW

## 2017-10-22 LAB — BASIC METABOLIC PANEL
Anion gap: 14 (ref 5–15)
BUN: 53 mg/dL — AB (ref 6–20)
CHLORIDE: 103 mmol/L (ref 101–111)
CO2: 25 mmol/L (ref 22–32)
CREATININE: 2.51 mg/dL — AB (ref 0.61–1.24)
Calcium: 9.2 mg/dL (ref 8.9–10.3)
GFR calc Af Amer: 25 mL/min — ABNORMAL LOW (ref >60)
GFR calc non Af Amer: 21 mL/min — ABNORMAL LOW (ref >60)
Glucose, Bld: 145 mg/dL — ABNORMAL HIGH (ref 65–99)
Potassium: 3.4 mmol/L — ABNORMAL LOW (ref 3.5–5.1)
SODIUM: 142 mmol/L (ref 135–145)

## 2017-10-22 LAB — CBC WITH DIFFERENTIAL/PLATELET
Basophils Absolute: 0 10*3/uL (ref 0.0–0.1)
Basophils Relative: 0 %
EOS ABS: 0 10*3/uL (ref 0.0–0.7)
Eosinophils Relative: 0 %
HCT: 37.9 % — ABNORMAL LOW (ref 39.0–52.0)
HEMOGLOBIN: 12.1 g/dL — AB (ref 13.0–17.0)
LYMPHS ABS: 1.3 10*3/uL (ref 0.7–4.0)
Lymphocytes Relative: 10 %
MCH: 27.3 pg (ref 26.0–34.0)
MCHC: 31.9 g/dL (ref 30.0–36.0)
MCV: 85.4 fL (ref 78.0–100.0)
MONO ABS: 1.6 10*3/uL — AB (ref 0.1–1.0)
MONOS PCT: 12 %
Neutro Abs: 10 10*3/uL — ABNORMAL HIGH (ref 1.7–7.7)
Neutrophils Relative %: 78 %
Platelets: 269 10*3/uL (ref 150–400)
RBC: 4.44 MIL/uL (ref 4.22–5.81)
RDW: 17.5 % — ABNORMAL HIGH (ref 11.5–15.5)
WBC: 12.9 10*3/uL — ABNORMAL HIGH (ref 4.0–10.5)

## 2017-10-22 LAB — GLUCOSE, CAPILLARY
Glucose-Capillary: 120 mg/dL — ABNORMAL HIGH (ref 65–99)
Glucose-Capillary: 109 mg/dL — ABNORMAL HIGH (ref 65–99)
Glucose-Capillary: 146 mg/dL — ABNORMAL HIGH (ref 65–99)
Glucose-Capillary: 182 mg/dL — ABNORMAL HIGH (ref 65–99)

## 2017-10-22 MED ORDER — IPRATROPIUM-ALBUTEROL 0.5-2.5 (3) MG/3ML IN SOLN
3.0000 mL | Freq: Three times a day (TID) | RESPIRATORY_TRACT | Status: DC
  Administered 2017-10-22 – 2017-10-25 (×9): 3 mL via RESPIRATORY_TRACT
  Filled 2017-10-22 (×9): qty 3

## 2017-10-22 MED ORDER — FUROSEMIDE 10 MG/ML IJ SOLN
40.0000 mg | Freq: Two times a day (BID) | INTRAMUSCULAR | Status: DC
  Administered 2017-10-22 (×2): 40 mg via INTRAVENOUS
  Filled 2017-10-22 (×2): qty 4

## 2017-10-22 MED ORDER — POTASSIUM CHLORIDE CRYS ER 20 MEQ PO TBCR
40.0000 meq | EXTENDED_RELEASE_TABLET | Freq: Once | ORAL | Status: AC
  Administered 2017-10-22: 40 meq via ORAL
  Filled 2017-10-22: qty 2

## 2017-10-22 NOTE — Progress Notes (Signed)
Pt and his wife are requesting that patient go to rehab for a few weeks so that he can work on walking. Patient said if rehab is not recommended that he definitely needs help at home more than the one day per week that he is receiving now.

## 2017-10-22 NOTE — Progress Notes (Signed)
PROGRESS NOTE    Greg Jones  ZOX:[id number] DOB: [birth year] DOA: 10/18/2017 PCP: Deeann Saint, MD    Brief Narrative:  Greg Jones is a 84 y.o. male with medical history significant of COPD; CKD; CHF; HTN; and GERD presenting with SOB.  He reports that he is here for SOB and also a rash that he is supposed to be seeing a doctor for Monday, that is not getting any better, breaking out even in his hair, neck, back.  SOB for over a year, seems to be getting shorter.  He is coughing very little, occasionally productive of whitish sputum.  No fever.  +wheezing.  +tobacco remotely, quit 4 years ago after smoking for 70 years (about 1/2 ppd).  The rash started about a month ago.  He has about 10 different creams - they occasionally stop the itching but the rash does not change and is getting worse.  Weight has been stable.  ED Course:  Copd worsening for a few weeks.  Tachypnea, pursed lip breathing.  Solumedrol, nebs.  Needs admission.  Has erythematous rash from torso down, being treated for fungal infection.     Assessment & Plan:   Principal Problem:   COPD with acute exacerbation (HCC) Active Problems:   Essential hypertension   Chronic combined systolic and diastolic CHF (congestive heart failure) (HCC)   CKD (chronic kidney disease), stage IV (HCC)   Type 2 diabetes mellitus with other specified complication (HCC)  #1 acute COPD exacerbation Patient presented with mild acute COPD exacerbation with and wheezing Some worsening shortness of breath that has chronically been getting worse over the past year.  Patient noted to have a history of severe COPD not on home O2 per patient. Patient noted on minimal ambulation to be significantly short of breath my use of accessory muscles of respiration and wheezing.  Patient also noted to have some upper airway noise.  Slight improvement or shortness of breath with minimal exertion after dose of IV Lasix yesterday.  Continue current  regimen of scheduled nebs, PPI, doxycycline, Pulmicort, Claritin, Flonase.  IV steroid taper.  Continue Brovana.  640 mg IV every 12 hours for the next 24 hours and reassess. Patient seen in consultation by pulmonary who recommended addition of Brovana, continuation of Pulmicort, steroid taper continuation of bronchodilators and goal to keep sats greater than 88%.  It was felt patient may benefit from short-term rehab facility on discharge.  Patient also need to follow-up with pulmonary in the outpatient setting.   2.  Hypertension Blood Pressure stable.  Continue current regimen of Norvasc and Imdur and diuretics.   3.  Chronic kidney disease stage IV Stable.  Monitor closely with IV Lasix.  4.  Type 2 diabetes Hemoglobin A1c was 8.1 on 08/18/2017.  CBGs 109 - 146.  Continue Lantus.  Continue sliding scale insulin.    5.  Chronic combined systolic and diastolic CHF 2D echo from 08/15/2017 with a EF of 45 to 50% with grade 1 diastolic dysfunction.  Patient however with shortness of breath with minimal exertion per RN.  Patient also chronically debilitated.  Patient given a dose of IV Lasix overnight urine output of 1.425 L with some clinical which shortness of breath on minimal.  Placed on Lasix 40 mg IV every 12 hours for the next 24 hours and reassess.  Strict I's and O's.  Daily weights.  Follow.   6 pruritic rash Patient with a progressive pruritic skin rash.  Lasix has been resumed.  Continue  IV steroids taper.  Continue Vistaril as needed for itching.  Patient noted to have a dermatology appointment on Monday, Oct 22, 2017.  Will need to reschedule dermatology appointment.  Outpatient follow-up with dermatology.   DVT prophylaxis: Lovenox Code Status: Full Family Communication: updated patient.  No family at bedside. Disposition Plan: Likely skilled nursing facility versus home with home health once improved and at baseline and when okay with pulmonary.     Consultants:   Pulmonary: Dr.  Kendrick Fries 10/20/2017  Procedures:   Chest x-ray 10/18/2017  Antimicrobials:   Doxycycline 10/19/2017   Subjective: Patient states slight improvement with shortness of breath with minimal exertion after IV dose of Lasix yesterday.  States feels shortness of breath slowly improving.  No chest pain.     Objective: Vitals:   10/22/17 0556 10/22/17 0816 10/22/17 0900 10/22/17 0928  BP: (!) 152/68  (!) 154/72 (!) 154/72  Pulse: 93 82 88 85  Resp: Temp: 98.4 F (36.9 C)  98.6 F (37 C) 98.6 F (37 C)  TempSrc: Oral  Oral Oral  SpO2: 92% 93% 91% 98%  Weight:      Height:        Intake/Output Summary (Last 24 hours) at 10/22/2017 1016 Last data filed at 10/22/2017 0900 Gross per 24 hour  Intake 1180 ml  Output 1775 ml  Net -595 ml   Filed Weights   10/18/17 1721 10/22/17 0550  Weight: 84.8 kg (187 lb) 79.1 kg (174 lb 4.8 oz)    Examination:  General exam: Sitting up in bed.  Respiratory system: Poor to fair air movement.  Some mild expiratory wheezing.  Less upper airway noise.  Lungs with some mild expiratory wheezing.  Cardiovascular system: RRR no murmurs rubs or gallops.  No lower extremity edema.  No JVD.   Gastrointestinal system: Abdomen is mildly distended, positive bowel sounds, nontender to palpation, somewhat soft.   Central nervous system: Alert and oriented. No focal neurological deficits. Extremities: Symmetric 5 x 5 power. Skin: Diffuse rash on torso and abdomen with some confluence on the chest and some patchy areas on the abdomen.  Pruritic.  Psychiatry: Judgement and insight appear normal. Mood & affect appropriate.     Data Reviewed: I have personally reviewed following labs and imaging studies  CBC: Recent Labs  Lab 10/18/17 1611 10/19/17 0559 10/20/17 0517 10/21/17 0508 10/22/17 0446  WBC 14.3* 12.6* 17.3* 13.5* 12.9*  NEUTROABS 11.1*  --   --  11.8* 10.0*  HGB 15.9 12.5* 11.6* 12.5* 12.1*  HCT 48.5 39.6 36.3* 39.5 37.9*  MCV 86.8 86.1  85.8 85.3 85.4  PLT 292 258 266 259 269   Basic Metabolic Panel: Recent Labs  Lab 10/18/17 1611 10/19/17 0559 10/20/17 0517 10/21/17 0508 10/22/17 0446  NA 147* 144 140 138 142  K 3.5 3.7 4.0 3.8 3.4*  CL 103 104 99* 100* 103  CO2 GLUCOSE 131* 137* 178* 204* 145*  BUN 22* 25* 37* 43* 53*  CREATININE 2.47* 2.34* 2.30* 2.41* 2.51*  CALCIUM 9.9 9.2 9.5 9.3 9.2  MG 2.2  --  2.6*  --   --    GFR: Estimated Creatinine Clearance: 19 mL/min (A) (by C-G formula based on SCr of 2.51 mg/dL (H)). Liver Function Tests: No results for input(s): AST, ALT, ALKPHOS, BILITOT, PROT, ALBUMIN in the last 168 hours. No results for input(s): LIPASE, AMYLASE in the last 168 hours. No results for input(s): AMMONIA  in the last 168 hours. Coagulation Profile: No results for input(s): INR, PROTIME in the last 168 hours. Cardiac Enzymes: No results for input(s): CKTOTAL, CKMB, CKMBINDEX, TROPONINI in the last 168 hours. BNP (last 3 results) No results for input(s): PROBNP in the last 8760 hours. HbA1C: No results for input(s): HGBA1C in the last 72 hours. CBG: Recent Labs  Lab 10/21/17 0737 10/21/17 1203 10/21/17 1754 10/21/17 2206 10/22/17 0712  GLUCAP 199* 184* 177* 246* 120*   Lipid Profile: No results for input(s): CHOL, HDL, LDLCALC, TRIG, CHOLHDL, LDLDIRECT in the last 72 hours. Thyroid Function Tests: No results for input(s): TSH, T4TOTAL, FREET4, T3FREE, THYROIDAB in the last 72 hours. Anemia Panel: No results for input(s): VITAMINB12, FOLATE, FERRITIN, TIBC, IRON, RETICCTPCT in the last 72 hours. Sepsis Labs: No results for input(s): PROCALCITON, LATICACIDVEN in the last 168 hours.  No results found for this or any previous visit (from the past 240 hour(s)).       Radiology Studies: Dg Chest Port 1 View  Result Date: 10/22/2017 CLINICAL DATA:  Dyspnea EXAM: PORTABLE CHEST 1 VIEW COMPARISON:  10/18/2017 FINDINGS: Stable cardiomegaly. No aortic aneurysm. Mild  pulmonary vascular congestion. No pulmonary consolidation, effusion or pneumothorax. Mild osteoarthritic change about both AC and glenohumeral joints. IMPRESSION: Stable cardiomegaly.  Mild vascular congestion. Electronically Signed   By: Tollie Eth M.D.   On: 10/22/2017 01:40        Scheduled Meds: . amLODipine  10 mg Oral Daily  . arformoterol  15 mcg Nebulization BID  . aspirin EC  81 mg Oral Daily  . budesonide (PULMICORT) nebulizer solution  0.5 mg Nebulization BID  . calcitRIOL  0.25 mcg Oral QODAY  . docusate sodium  100 mg Oral BID  . doxycycline  100 mg Oral Q12H  . enoxaparin (LOVENOX) injection  30 mg Subcutaneous QHS  . fluticasone  2 spray Each Nare Daily  . furosemide  40 mg Intravenous Q12H  . insulin aspart  0-20 Units Subcutaneous TID WC  . insulin aspart  0-5 Units Subcutaneous QHS  . insulin glargine  14 Units Subcutaneous QHS  . ipratropium-albuterol  3 mL Nebulization TID  . isosorbide mononitrate  30 mg Oral Daily  . loratadine  10 mg Oral Daily  . methylPREDNISolone (SOLU-MEDROL) injection  60 mg Intravenous Q24H  . pantoprazole  40 mg Oral Daily  . polyethylene glycol  17 g Oral BID  . terbinafine  1 application Topical BID   Continuous Infusions:   LOS: 3 days    Time spent: 35 minutes    Ramiro Harvest, MD [facility] Pager [phone]  If 7PM-7AM, please contact night-coverage [email or web address] Password [email or web address] 10/22/2017, 10:16 AM

## 2017-10-22 NOTE — Progress Notes (Addendum)
Physical Therapy Treatment Patient Details Name: Greg Jones MRN: [id number] DOB: 10/28/1927 Today's Date: 10/22/2017    History of Present Illness Pt was admitted with COPD with acute exacerbation.  PMH:  CKD, CHF, HTN, DM    PT Comments    Pt feeling better.  RA at rest 96%.  Assisted OOB to amb a greater distance using Rollator walker such that pt could stop, sit and rest on rollator seat when SOB.  Pt aware self limitations and performed purse lip breathing with minimal cues.  Avg RA with activity was 94 with HR 105.  Pt lives with sister who is 53 years younger and is able to assist pt with meals, meds and MD appointments.    Follow Up Recommendations  SNF vs HH Pending family decision  Equipment Recommendations       Recommendations for Other Services       Precautions / Restrictions Precautions Precautions: Fall Precaution Comments: monitor sats Restrictions Weight Bearing Restrictions: No    Mobility  Bed Mobility Overal bed mobility: Needs Assistance Bed Mobility: Supine to Sit     Supine to sit: Min guard;Supervision     General bed mobility comments: able to self perfom with increased time  Transfers Overall transfer level: Needs assistance Equipment used: Rolling walker (2 wheeled) Transfers: Sit to/from Stand Sit to Stand: Min guard;Supervision Stand pivot transfers: Supervision;Min guard       General transfer comment: good use of hands to steady self and good self awareness of activbity limitations/need to stop, sit, rest and breath  Ambulation/Gait Ambulation/Gait assistance: Supervision;Min guard Ambulation Distance (Feet): 50 Feet(x 3 sitting rest breaks) Assistive device: 4-wheeled walker(rollator walker) Gait Pattern/deviations: Step-through pattern;Decreased stride length;Trunk flexed Gait velocity: decreased   General Gait Details: tolerated increased distances.  Self awareness activity limitations.  Utilized rollator for sitting rest  breaks.  Performed self purse lip breathing with minimal cues.  RA avg 94% and HR 105   Stairs             Wheelchair Mobility    Modified Rankin (Stroke Patients Only)       Balance                                            Cognition Arousal/Alertness: Awake/alert Behavior During Therapy: WFL for tasks assessed/performed Overall Cognitive Status: Within Functional Limits for tasks assessed                                 General Comments: pt appears at prior level, pleasant      Exercises      General Comments        Pertinent Vitals/Pain Pain Assessment: No/denies pain    Home Living                      Prior Function            PT Goals (current goals can now be found in the care plan section) Progress towards PT goals: Progressing toward goals    Frequency    Min 3X/week      PT Plan Current plan remains appropriate    Co-evaluation              AM-PAC PT "6 Clicks" Daily Activity  Outcome Measure  Difficulty  turning over in bed (including adjusting bedclothes, sheets and blankets)?: A Little Difficulty moving from lying on back to sitting on the side of the bed? : A Little Difficulty sitting down on and standing up from a chair with arms (e.g., wheelchair, bedside commode, etc,.)?: A Little Help needed moving to and from a bed to chair (including a wheelchair)?: A Little Help needed walking in hospital room?: A Little Help needed climbing 3-5 steps with a railing? : A Lot 6 Click Score: 17    End of Session Equipment Utilized During Treatment: Gait belt Activity Tolerance: Patient limited by fatigue Patient left: with call bell/phone within reach;in chair;with chair alarm set;with family/visitor present Nurse Communication: Mobility status PT Visit Diagnosis: Difficulty in walking, not elsewhere classified (R26.2);Muscle weakness (generalized) (M62.81)     Time: 9629-5284 PT Time  Calculation (min) (ACUTE ONLY): 26 min  Charges:  $Gait Training: 8-22 mins $Therapeutic Activity: 8-22 mins                    G Codes:       Felecia Shelling  PTA [facility]  Acute  Rehab Pager      [phone]

## 2017-10-22 NOTE — Progress Notes (Signed)
Occupational Therapy Treatment Patient Details Name: Greg Jones MRN: [id number] DOB: 18-Oct-1927 Today's Date: 10/22/2017    History of present illness Pt was admitted with COPD with acute exacerbation.  PMH:  CKD, CHF, HTN, DM   OT comments  Energy conservation handout provided  Follow Up Recommendations  Home health OT;SNF    Equipment Recommendations  None recommended by OT    Recommendations for Other Services      Precautions / Restrictions Precautions Precautions: Fall Restrictions Weight Bearing Restrictions: No       Mobility Bed Mobility               General bed mobility comments: in chair  Transfers Overall transfer level: Needs assistance Equipment used: Rolling walker (2 wheeled) Transfers: Sit to/from Stand Sit to Stand: Min guard         General transfer comment: cues for hand placement        ADL either performed or assessed with clinical judgement   ADL Overall ADL's : Needs assistance/impaired     Grooming: Standing;Min guard               Lower Body Dressing: Moderate assistance;Sit to/from stand;Cueing for sequencing;Cueing for safety   Toilet Transfer: Minimal assistance;Stand-pivot;RW   Toileting- Clothing Manipulation and Hygiene: Minimal assistance;Sit to/from stand         General ADL Comments: energy conservation handout provided and education provided.  Will need further educattion               Cognition Arousal/Alertness: Awake/alert Behavior During Therapy: WFL for tasks assessed/performed Overall Cognitive Status: No family/caregiver present to determine baseline cognitive functioning                                 General Comments: pt is oriented, answers questions appropriately, follows commands consistently during PT eval; pt cognitive status not different than baseline per sister (?); requires safety cues with RW in small space of bathroom         Prior  Functioning/Environment              Frequency  Min 2X/week        Progress Toward Goals  OT Goals(current goals can now be found in the care plan section)  Progress towards OT goals: Progressing toward goals     Plan Discharge plan needs to be updated    Co-evaluation                 AM-PAC PT "6 Clicks" Daily Activity     Outcome Measure   Help from another person eating meals?: None Help from another person taking care of personal grooming?: A Little Help from another person toileting, which includes using toliet, bedpan, or urinal?: A Little Help from another person bathing (including washing, rinsing, drying)?: A Lot Help from another person to put on and taking off regular upper body clothing?: A Little Help from another person to put on and taking off regular lower body clothing?: Total 6 Click Score: 16    End of Session Equipment Utilized During Treatment: Rolling walker  OT Visit Diagnosis: Muscle weakness (generalized) (M62.81);Unsteadiness on feet (R26.81)   Activity Tolerance Patient limited by lethargy(work of breathing)   Patient Left in chair;with call bell/phone within reach;with chair alarm set   Nurse Communication Mobility status        Time: 4098-1191 OT Time Calculation (min): 15 min  Charges:  OT General Charges $OT Visit: 1 Visit OT Treatments $Self Care/Home Management : 8-22 mins  [place], [place] [phone]   Alba Cory 10/22/2017, 12:25 PM

## 2017-10-23 LAB — GLUCOSE, CAPILLARY
GLUCOSE-CAPILLARY: 89 mg/dL (ref 65–99)
GLUCOSE-CAPILLARY: 109 mg/dL — AB (ref 65–99)
Glucose-Capillary: 189 mg/dL — ABNORMAL HIGH (ref 65–99)
Glucose-Capillary: 213 mg/dL — ABNORMAL HIGH (ref 65–99)

## 2017-10-23 LAB — BASIC METABOLIC PANEL
Anion gap: 13 (ref 5–15)
BUN: 51 mg/dL — AB (ref 6–20)
CHLORIDE: 107 mmol/L (ref 101–111)
CO2: 27 mmol/L (ref 22–32)
CREATININE: 2.09 mg/dL — AB (ref 0.61–1.24)
Calcium: 9.5 mg/dL (ref 8.9–10.3)
GFR calc Af Amer: 31 mL/min — ABNORMAL LOW (ref >60)
GFR calc non Af Amer: 27 mL/min — ABNORMAL LOW (ref >60)
GLUCOSE: 96 mg/dL (ref 65–99)
POTASSIUM: 4 mmol/L (ref 3.5–5.1)
SODIUM: 147 mmol/L — AB (ref 135–145)

## 2017-10-23 MED ORDER — FUROSEMIDE 40 MG PO TABS
80.0000 mg | ORAL_TABLET | Freq: Every day | ORAL | Status: DC
  Administered 2017-10-24 – 2017-10-25 (×2): 80 mg via ORAL
  Filled 2017-10-23 (×2): qty 2

## 2017-10-23 NOTE — Progress Notes (Signed)
Occupational Therapy Treatment Patient Details Name: Greg Jones MRN: [id number] DOB: Nov 06, 1927 Today's Date: 10/23/2017    History of present illness Pt was admitted with COPD with acute exacerbation.  PMH:  CKD, CHF, HTN, DM   OT comments  Pt in bed soaked with urine upon OT arrival.  Pt had not called for A.  Pt state he thought the condom cath was on but it was not   Follow Up Recommendations  SNF    Equipment Recommendations  None recommended by OT    Recommendations for Other Services      Precautions / Restrictions Precautions Precautions: Fall Precaution Comments: monitor sats Restrictions Weight Bearing Restrictions: No       Mobility Bed Mobility Overal bed mobility: Needs Assistance Bed Mobility: Supine to Sit     Supine to sit: Supervision     General bed mobility comments: able to self perfom with increased time  Transfers Overall transfer level: Needs assistance Equipment used: Rolling walker (2 wheeled) Transfers: Sit to/from Stand Sit to Stand: Min guard Stand pivot transfers: Min guard            Balance Overall balance assessment: Needs assistance(denies hx of falls)   Sitting balance-Leahy Scale: Fair       Standing balance-Leahy Scale: Poor Standing balance comment: reliant upon RW; uses rollator at home                           ADL either performed or assessed with clinical judgement   ADL Overall ADL's : Needs assistance/impaired     Grooming: Standing;Min guard                   Toilet Transfer: Minimal assistance;Stand-pivot;RW;Comfort height toilet   Toileting- Clothing Manipulation and Hygiene: Minimal assistance;Sit to/from stand         General ADL Comments: Pt Soaked in bed upon OT arrival. RN notifed. Pt seemed unaware and had not called for A     Vision Patient Visual Report: No change from baseline            Cognition Arousal/Alertness: Awake/alert Behavior During Therapy: WFL  for tasks assessed/performed Overall Cognitive Status: No family/caregiver present to determine baseline cognitive functioning                                 General Comments: pt appears at prior level, pleasant        Exercises     Shoulder Instructions       General Comments      Pertinent Vitals/ Pain       Pain Assessment: No/denies pain  Home Living                                              Frequency  Min 2X/week        Progress Toward Goals  OT Goals(current goals can now be found in the care plan section)  Progress towards OT goals: Progressing toward goals     Plan Discharge plan needs to be updated       AM-PAC PT "6 Clicks" Daily Activity     Outcome Measure   Help from another person eating meals?: None Help from another person taking care of personal grooming?: A  Little Help from another person toileting, which includes using toliet, bedpan, or urinal?: A Little Help from another person bathing (including washing, rinsing, drying)?: A Lot Help from another person to put on and taking off regular upper body clothing?: A Little Help from another person to put on and taking off regular lower body clothing?: Total 6 Click Score: 16    End of Session Equipment Utilized During Treatment: Rolling walker  OT Visit Diagnosis: Muscle weakness (generalized) (M62.81);Unsteadiness on feet (R26.81)   Activity Tolerance Patient limited by lethargy(work of breathing)   Patient Left in chair;with call bell/phone within reach;with chair alarm set   Nurse Communication Mobility status        Time: 1000-1017 OT Time Calculation (min): 17 min  Charges: OT General Charges $OT Visit: 1 Visit OT Treatments $Self Care/Home Management : 8-22 mins  [place], [place] [phone]   Alba Cory 10/23/2017, 10:54 AM

## 2017-10-23 NOTE — Clinical Social Work Placement (Addendum)
Patient agreeable to SNF placement at this time. CSW discussed SNF bed availability with the patient at bedside and sister via phone. CSW educated them both about the SNF process. Patient and sister both agreeable to [facility] for short rehab. Patient has met 3 night stay for medicare authorization requirements.  Patient is scheduled to complete SNF admission paperwork at 9:00 am.    CLINICAL SOCIAL WORK PLACEMENT  NOTE  Date:  10/23/2017  Patient Details  Name: Trae Bovenzi MRN: [id number] Date of Birth: 11/19/1927  Clinical Social Work is seeking post-discharge placement for this patient at the [place] level of care (*CSW will initial, date and re-position this form in  chart as items are completed):  Yes   Patient/family provided with [facility] Work Department's list of facilities offering this level of care within the geographic area requested by the patient (or if unable, by the patient's family).  Yes   Patient/family informed of their freedom to choose among providers that offer the needed level of care, that participate in Medicare, Medicaid or managed care program needed by the patient, have an available bed and are willing to accept the patient.  Yes   Patient/family informed of [facility]'s ownership interest in [facility] and [facility], as well as of the fact that they are under no obligation to receive care at these facilities.  PASRR submitted to EDS on       PASRR number received on       Existing PASRR number confirmed on 10/21/17     FL2 transmitted to all facilities in geographic area requested by pt/family on       FL2 transmitted to all facilities within larger geographic area on 10/23/17     Patient informed that his/her managed care company has contracts with or will negotiate with certain facilities, including the following:  [facility]     Yes   Patient/family informed of bed offers  received.  Patient chooses bed at [facility]     Physician recommends and patient chooses bed at      Patient to be transferred to [facility] on 10/24/17.  Patient to be transferred to facility by       Patient family notified on   of transfer.  Name of family member notified:  Sister:[name]      PHYSICIAN Please prepare priority discharge summary, including medications     Additional Comment:    _______________________________________________ Lia Hopping, LCSW 10/23/2017, 11:33 AM

## 2017-10-23 NOTE — Progress Notes (Signed)
PROGRESS NOTE    Greg Jones  ZOX:[id number] DOB: 10-27-1927 DOA: 10/18/2017 PCP: Deeann Saint, MD    Brief Narrative:  Greg Jones is a 84 y.o. male with medical history significant of COPD; CKD; CHF; HTN; and GERD presenting with SOB.  He reports that he is here for SOB and also a rash that he is supposed to be seeing a doctor for Monday, that is not getting any better, breaking out even in his hair, neck, back.  SOB for over a year, seems to be getting shorter.  He is coughing very little, occasionally productive of whitish sputum.  No fever.  +wheezing.  +tobacco remotely, quit 4 years ago after smoking for 70 years (about 1/2 ppd).  The rash started about a month ago.  He has about 10 different creams - they occasionally stop the itching but the rash does not change and is getting worse.  Weight has been stable.  ED Course:  Copd worsening for a few weeks.  Tachypnea, pursed lip breathing.  Solumedrol, nebs.  Needs admission.  Has erythematous rash from torso down, being treated for fungal infection.     Assessment & Plan:   Principal Problem:   COPD with acute exacerbation (HCC) Active Problems:   Essential hypertension   Chronic combined systolic and diastolic CHF (congestive heart failure) (HCC)   CKD (chronic kidney disease), stage IV (HCC)   Type 2 diabetes mellitus with other specified complication (HCC)  #1 acute COPD exacerbation Patient presented with mild acute COPD exacerbation with and wheezing Some worsening shortness of breath that has chronically been getting worse over the past year.  Patient noted to have a history of severe COPD not on home O2 per patient. Patient noted on minimal ambulation to be significantly short of breath my use of accessory muscles of respiration and wheezing.  Patient also noted to have some upper airway noise.  Slight improvement or shortness of breath with minimal exertion after dose of IV Lasix.  Continue current regimen of  scheduled nebs, PPI, doxycycline, Pulmicort, Claritin, Flonase.  IV steroid taper.  Continue Brovana.  Continue IV Lasix as sodium level increased slightly.  Patient seen in consultation by pulmonary who recommended addition of Brovana, continuation of Pulmicort, steroid taper continuation of bronchodilators and goal to keep sats greater than 88%.  It was felt patient may benefit from short-term rehab facility on discharge.  Patient also need to follow-up with pulmonary in the outpatient setting.  See pulmonary consultation note.  2.  Hypertension Stable.  Continue current regimen of Norvasc, Imdur.  Discontinue IV Lasix today and resume home dose oral Lasix tomorrow.   3.  Chronic kidney disease stage IV Renal function improved with diuresis.  Sodium level slightly increased.  Discontinue IV Lasix.  Resume home dose oral Lasix tomorrow.    4.  Type 2 diabetes Hemoglobin A1c was 8.1 on 08/18/2017.  CBGs 89 - 189.  Continue Lantus.  Continue sliding scale insulin.    5.  Chronic combined systolic and diastolic CHF 2D echo from 08/15/2017 with a EF of 45 to 50% with grade 1 diastolic dysfunction.  Patient however with shortness of breath with minimal exertion per RN.  Patient also chronically debilitated.  Patient given a dose of IV Lasix overnight urine output of 1.825 L with some clinical which shortness of breath on minimal.  Sodium levels elevated.  Discontinue IV Lasix today.  Resume home dose oral Lasix tomorrow. Strict I's and O's.  Daily weights.  Follow.   6 pruritic rash Patient with a progressive pruritic skin rash.  Lasix has been resumed.  Continue IV steroids taper.  Continue Vistaril as needed for itching.  Patient noted to have a dermatology appointment on Monday, Oct 22, 2017.  Will need to reschedule dermatology appointment.  Outpatient follow-up with dermatology.  Graft   #7 hypernatremia May be secondary to diuretics.  Discontinue IV Lasix.  Resume home dose Lasix tomorrow.   DVT  prophylaxis: Lovenox Code Status: Full Family Communication: updated patient.  No family at bedside. Disposition Plan: Likely skilled nursing facility in the next 24 to 48 hours if continued improvement and improvement with hypernatremia.     Consultants:   Pulmonary: Dr. Kendrick Fries 10/20/2017  Procedures:   Chest x-ray 10/18/2017  Antimicrobials:   Doxycycline 10/19/2017   Subjective: Patient feels shortness of breath improving and getting close to his baseline.  No chest pain.  No nausea.  No vomiting.    Objective: Vitals:   10/23/17 0625 10/23/17 0920 10/23/17 0923 10/23/17 0925  BP: (!) 159/76     Pulse: 80     Resp: 18     Temp: 98.1 F (36.7 C)     TempSrc: Oral     SpO2: 95% 96% 97% 97%  Weight: 79 kg (174 lb 2.6 oz)     Height:        Intake/Output Summary (Last 24 hours) at 10/23/2017 1052 Last data filed at 10/23/2017 1000 Gross per 24 hour  Intake 1410 ml  Output 1375 ml  Net 35 ml   Filed Weights   10/18/17 1721 10/22/17 0550 10/23/17 0625  Weight: 84.8 kg (187 lb) 79.1 kg (174 lb 4.8 oz) 79 kg (174 lb 2.6 oz)    Examination:  General exam: Sitting up in bed.  Respiratory system: Poor to fair air movement.  Minimal to mild expiratory wheezing.  Less upper airway noise.  No rhonchi.    Cardiovascular system: Regular rate and rhythm no murmurs rubs or gallops.  No lower extremity edema.  No JVD. Gastrointestinal system: Abdomen is mildly distended, less tight, positive bowel sounds, no rebound, no guarding.    Central nervous system: Alert and oriented. No focal neurological deficits. Extremities: Symmetric 5 x 5 power. Skin: Diffuse rash on torso and abdomen with some confluence on the chest and some patchy areas on the abdomen.  Pruritic.  Dry skin. Psychiatry: Judgement and insight appear normal. Mood & affect appropriate.     Data Reviewed: I have personally reviewed following labs and imaging studies  CBC: Recent Labs  Lab 10/18/17 1611  10/19/17 0559 10/20/17 0517 10/21/17 0508 10/22/17 0446  WBC 14.3* 12.6* 17.3* 13.5* 12.9*  NEUTROABS 11.1*  --   --  11.8* 10.0*  HGB 15.9 12.5* 11.6* 12.5* 12.1*  HCT 48.5 39.6 36.3* 39.5 37.9*  MCV 86.8 86.1 85.8 85.3 85.4  PLT 292 258 266 259 269   Basic Metabolic Panel: Recent Labs  Lab 10/18/17 1611 10/19/17 0559 10/20/17 0517 10/21/17 0508 10/22/17 0446 10/23/17 0445  NA 147* 144 140 138 142 147*  K 3.5 3.7 4.0 3.8 3.4* 4.0  CL 103 104 99* 100* 103 107  CO2 GLUCOSE 131* 137* 178* 204* 145* 96  BUN 22* 25* 37* 43* 53* 51*  CREATININE 2.47* 2.34* 2.30* 2.41* 2.51* 2.09*  CALCIUM 9.9 9.2 9.5 9.3 9.2 9.5  MG 2.2  --  2.6*  --   --   --  GFR: Estimated Creatinine Clearance: 22.8 mL/min (A) (by C-G formula based on SCr of 2.09 mg/dL (H)). Liver Function Tests: No results for input(s): AST, ALT, ALKPHOS, BILITOT, PROT, ALBUMIN in the last 168 hours. No results for input(s): LIPASE, AMYLASE in the last 168 hours. No results for input(s): AMMONIA in the last 168 hours. Coagulation Profile: No results for input(s): INR, PROTIME in the last 168 hours. Cardiac Enzymes: No results for input(s): CKTOTAL, CKMB, CKMBINDEX, TROPONINI in the last 168 hours. BNP (last 3 results) No results for input(s): PROBNP in the last 8760 hours. HbA1C: No results for input(s): HGBA1C in the last 72 hours. CBG: Recent Labs  Lab 10/22/17 0712 10/22/17 1236 10/22/17 1634 10/22/17 2145 10/23/17 0734  GLUCAP 120* 109* 146* 182* 89   Lipid Profile: No results for input(s): CHOL, HDL, LDLCALC, TRIG, CHOLHDL, LDLDIRECT in the last 72 hours. Thyroid Function Tests: No results for input(s): TSH, T4TOTAL, FREET4, T3FREE, THYROIDAB in the last 72 hours. Anemia Panel: No results for input(s): VITAMINB12, FOLATE, FERRITIN, TIBC, IRON, RETICCTPCT in the last 72 hours. Sepsis Labs: No results for input(s): PROCALCITON, LATICACIDVEN in the last 168 hours.  No results found  for this or any previous visit (from the past 240 hour(s)).       Radiology Studies: Dg Chest Port 1 View  Result Date: 10/22/2017 CLINICAL DATA:  Dyspnea EXAM: PORTABLE CHEST 1 VIEW COMPARISON:  10/18/2017 FINDINGS: Stable cardiomegaly. No aortic aneurysm. Mild pulmonary vascular congestion. No pulmonary consolidation, effusion or pneumothorax. Mild osteoarthritic change about both AC and glenohumeral joints. IMPRESSION: Stable cardiomegaly.  Mild vascular congestion. Electronically Signed   By: Tollie Eth M.D.   On: 10/22/2017 01:40        Scheduled Meds: . amLODipine  10 mg Oral Daily  . arformoterol  15 mcg Nebulization BID  . aspirin EC  81 mg Oral Daily  . budesonide (PULMICORT) nebulizer solution  0.5 mg Nebulization BID  . calcitRIOL  0.25 mcg Oral QODAY  . docusate sodium  100 mg Oral BID  . doxycycline  100 mg Oral Q12H  . enoxaparin (LOVENOX) injection  30 mg Subcutaneous QHS  . fluticasone  2 spray Each Nare Daily  . insulin aspart  0-20 Units Subcutaneous TID WC  . insulin aspart  0-5 Units Subcutaneous QHS  . insulin glargine  14 Units Subcutaneous QHS  . ipratropium-albuterol  3 mL Nebulization TID  . isosorbide mononitrate  30 mg Oral Daily  . loratadine  10 mg Oral Daily  . methylPREDNISolone (SOLU-MEDROL) injection  60 mg Intravenous Q24H  . pantoprazole  40 mg Oral Daily  . polyethylene glycol  17 g Oral BID  . terbinafine  1 application Topical BID   Continuous Infusions:   LOS: 4 days    Time spent: 35 minutes    Ramiro Harvest, MD [facility] Pager [phone]  If 7PM-7AM, please contact night-coverage [email or web address] Password [email or web address] 10/23/2017, 10:52 AM

## 2017-10-23 NOTE — Care Management Important Message (Signed)
Important Message  Patient Details  Name: Jomari Bartnik MRN: [id number] Date of Birth: 09/01/1927   Medicare Important Message Given:  Yes    Caren Macadam 10/23/2017, 12:13 PMImportant Message  Patient Details  Name: Sheddrick Lattanzio MRN: [id number] Date of Birth: [birth year]   Medicare Important Message Given:  Yes    Caren Macadam 10/23/2017, 12:13 PM

## 2017-10-24 LAB — CBC
HCT: 39.6 % (ref 39.0–52.0)
Hemoglobin: 12.6 g/dL — ABNORMAL LOW (ref 13.0–17.0)
MCH: 27.2 pg (ref 26.0–34.0)
MCHC: 31.8 g/dL (ref 30.0–36.0)
MCV: 85.5 fL (ref 78.0–100.0)
PLATELETS: 291 10*3/uL (ref 150–400)
RBC: 4.63 MIL/uL (ref 4.22–5.81)
RDW: 17.6 % — AB (ref 11.5–15.5)
WBC: 14.3 10*3/uL — ABNORMAL HIGH (ref 4.0–10.5)

## 2017-10-24 LAB — GLUCOSE, CAPILLARY
GLUCOSE-CAPILLARY: 95 mg/dL (ref 65–99)
Glucose-Capillary: 90 mg/dL (ref 65–99)
Glucose-Capillary: 104 mg/dL — ABNORMAL HIGH (ref 65–99)
Glucose-Capillary: 116 mg/dL — ABNORMAL HIGH (ref 65–99)

## 2017-10-24 LAB — BASIC METABOLIC PANEL
ANION GAP: 15 (ref 5–15)
BUN: 50 mg/dL — AB (ref 6–20)
CO2: 25 mmol/L (ref 22–32)
Calcium: 9.1 mg/dL (ref 8.9–10.3)
Chloride: 103 mmol/L (ref 101–111)
Creatinine, Ser: 2.1 mg/dL — ABNORMAL HIGH (ref 0.61–1.24)
GFR calc Af Amer: 31 mL/min — ABNORMAL LOW (ref >60)
GFR calc non Af Amer: 26 mL/min — ABNORMAL LOW (ref >60)
GLUCOSE: 105 mg/dL — AB (ref 65–99)
POTASSIUM: 3.9 mmol/L (ref 3.5–5.1)
Sodium: 143 mmol/L (ref 135–145)

## 2017-10-24 MED ORDER — METHYLPREDNISOLONE SODIUM SUCC 125 MG IJ SOLR
60.0000 mg | Freq: Every day | INTRAMUSCULAR | Status: DC
  Administered 2017-10-25: 60 mg via INTRAVENOUS
  Filled 2017-10-24: qty 2

## 2017-10-24 NOTE — Progress Notes (Signed)
PROGRESS NOTE    Greg Jones  ZOX:[id number] DOB: 1927/11/11 DOA: 10/18/2017 PCP: Deeann Saint, MD    Brief Narrative:  Greg Jones is a 84 y.o. male with medical history significant of COPD; CKD; CHF; HTN; and GERD presenting with SOB.  He reports that he is here for SOB and also a rash that he is supposed to be seeing a doctor for Monday, that is not getting any better, breaking out even in his hair, neck, back.  SOB for over a year, seems to be getting shorter.  He is coughing very little, occasionally productive of whitish sputum.  No fever.  +wheezing.  +tobacco remotely, quit 4 years ago after smoking for 70 years (about 1/2 ppd).  The rash started about a month ago.  He has about 10 different creams - they occasionally stop the itching but the rash does not change and is getting worse.  Weight has been stable.  ED Course:  Copd worsening for a few weeks.  Tachypnea, pursed lip breathing.  Solumedrol, nebs.  Needs admission.  Has erythematous rash from torso down, being treated for fungal infection.  Assessment & Plan:   Principal Problem:   COPD with acute exacerbation (HCC) Active Problems:   Essential hypertension   Chronic combined systolic and diastolic CHF (congestive heart failure) (HCC)   CKD (chronic kidney disease), stage IV (HCC)   Type 2 diabetes mellitus with other specified complication (HCC)  #1 acute COPD exacerbation Patient presented with mild acute COPD exacerbation with and wheezing Some worsening shortness of breath that has chronically been getting worse over the past year.  Patient noted to have a history of severe COPD not on home O2 per patient. Patient noted on minimal ambulation to be significantly short of breath my use of accessory muscles of respiration and wheezing.  Patient also noted to have some upper airway noise.  Slight improvement or shortness of breath with minimal exertion after dose of IV Lasix yesterday.  Continue current regimen of  scheduled nebs, PPI, doxycycline, Pulmicort, Claritin, Flonase.  IV steroid taper.  Continue Brovana.  640 mg IV every 12 hours for the next 24 hours and reassess. Patient seen in consultation by pulmonary who recommended addition of Brovana, continuation of Pulmicort, steroid taper continuation of bronchodilators and goal to keep sats greater than 88%.  It was felt patient may benefit from short-term rehab facility on discharge.  Patient also need to follow-up with pulmonary in the outpatient setting.  - will continue plan outlined above.   2.  Hypertension - Stable continue current regimen.   3.  Chronic kidney disease stage IV - Stable.  Monitor closely with Lasix.  4.  Type 2 diabetes Hemoglobin A1c was 8.1 on 08/18/2017.  CBGs 109 - 146.  Continue Lantus.  Continue sliding scale insulin.    5.  Chronic combined systolic and diastolic CHF 2D echo from 08/15/2017 with a EF of 45 to 50% with grade 1 diastolic dysfunction.  Patient however with shortness of breath with minimal exertion per RN.  Patient also chronically debilitated.  Patient given a dose of IV Lasix overnight urine output of 1.425 L with some clinical which shortness of breath on minimal.  Placed on Lasix 80 mg po daily. Strict I's and O's.  Daily weights.  Follow.   6. Pruritic rash Patient with a progressive pruritic skin rash.  Lasix has been resumed.  Continue IV steroids taper.  Continue Vistaril as needed for itching.  Patient noted to  have a dermatology appointment on Monday, Oct 22, 2017.  Will need to reschedule dermatology appointment.   - Outpatient follow-up with dermatology.   DVT prophylaxis: Lovenox Code Status: Full Family Communication: Updated patient.  No family at bedside. Disposition Plan: Likely skilled nursing facility versus home with home health once improved and at baseline and when okay with pulmonary.     Consultants:   Pulmonary: Dr. Kendrick Fries 10/20/2017  Procedures:   Chest x-ray  10/18/2017  Antimicrobials:   Doxycycline 10/19/2017   Subjective: No new complaints reported today.   Objective: Vitals:   10/24/17 1030 10/24/17 1437 10/24/17 1438 10/24/17 1446  BP:  124/62 124/62   Pulse:  97 93   Resp:  20 20   Temp:  98.2 F (36.8 C) 98.2 F (36.8 C)   TempSrc:   Oral   SpO2: 98% 96% 96% 94%  Weight:      Height:        Intake/Output Summary (Last 24 hours) at 10/24/2017 1721 Last data filed at 10/24/2017 1418 Gross per 24 hour  Intake 120 ml  Output 1100 ml  Net -980 ml   Filed Weights   10/22/17 0550 10/23/17 0625 10/24/17 0525  Weight: 79.1 kg (174 lb 4.8 oz) 79 kg (174 lb 2.6 oz) 81.1 kg (178 lb 12.7 oz)    Examination:  General exam: Pt in nad, alert and awake Respiratory system: Poor to fair air movement.  No wheezes.  Equal chest rise. Cardiovascular system: RRR no murmurs rubs or gallops.  No lower extremity edema.  No JVD.   Gastrointestinal system: Abdomen is mildly distended, positive bowel sounds, nontender to palpation, somewhat soft.   Central nervous system: Alert and oriented. No focal neurological deficits. Extremities: Symmetric 5 x 5 power. Skin: Diffuse rash on torso and abdomen with some confluence on the chest and some patchy areas on the abdomen.  Pruritic.  Psychiatry: Judgement and insight appear normal. Mood & affect appropriate.     Data Reviewed: I have personally reviewed following labs and imaging studies  CBC: Recent Labs  Lab 10/18/17 1611 10/19/17 0559 10/20/17 0517 10/21/17 0508 10/22/17 0446 10/24/17 0441  WBC 14.3* 12.6* 17.3* 13.5* 12.9* 14.3*  NEUTROABS 11.1*  --   --  11.8* 10.0*  --   HGB 15.9 12.5* 11.6* 12.5* 12.1* 12.6*  HCT 48.5 39.6 36.3* 39.5 37.9* 39.6  MCV 86.8 86.1 85.8 85.3 85.4 85.5  PLT 292 258 266 259 269 291   Basic Metabolic Panel: Recent Labs  Lab 10/18/17 1611  10/20/17 0517 10/21/17 0508 10/22/17 0446 10/23/17 0445 10/24/17 0441  NA 147*   < > 140 138 142 147* 143  K  3.5   < > 4.0 3.8 3.4* 4.0 3.9  CL 103   < > 99* 100* 103 107 103  CO2 27   < > GLUCOSE 131*   < > 178* 204* 145* 96 105*  BUN 22*   < > 37* 43* 53* 51* 50*  CREATININE 2.47*   < > 2.30* 2.41* 2.51* 2.09* 2.10*  CALCIUM 9.9   < > 9.5 9.3 9.2 9.5 9.1  MG 2.2  --  2.6*  --   --   --   --    < > = values in this interval not displayed.   GFR: Estimated Creatinine Clearance: 24.8 mL/min (A) (by C-G formula based on SCr of 2.1 mg/dL (H)). Liver Function Tests: No results for input(s): AST,  ALT, ALKPHOS, BILITOT, PROT, ALBUMIN in the last 168 hours. No results for input(s): LIPASE, AMYLASE in the last 168 hours. No results for input(s): AMMONIA in the last 168 hours. Coagulation Profile: No results for input(s): INR, PROTIME in the last 168 hours. Cardiac Enzymes: No results for input(s): CKTOTAL, CKMB, CKMBINDEX, TROPONINI in the last 168 hours. BNP (last 3 results) No results for input(s): PROBNP in the last 8760 hours. HbA1C: No results for input(s): HGBA1C in the last 72 hours. CBG: Recent Labs  Lab 10/23/17 1213 10/23/17 1645 10/23/17 2050 10/24/17 0733 10/24/17 1136  GLUCAP 109* 189* 213* 95 90   Lipid Profile: No results for input(s): CHOL, HDL, LDLCALC, TRIG, CHOLHDL, LDLDIRECT in the last 72 hours. Thyroid Function Tests: No results for input(s): TSH, T4TOTAL, FREET4, T3FREE, THYROIDAB in the last 72 hours. Anemia Panel: No results for input(s): VITAMINB12, FOLATE, FERRITIN, TIBC, IRON, RETICCTPCT in the last 72 hours. Sepsis Labs: No results for input(s): PROCALCITON, LATICACIDVEN in the last 168 hours.  No results found for this or any previous visit (from the past 240 hour(s)).       Radiology Studies: No results found.      Scheduled Meds: . amLODipine  10 mg Oral Daily  . arformoterol  15 mcg Nebulization BID  . aspirin EC  81 mg Oral Daily  . budesonide (PULMICORT) nebulizer solution  0.5 mg Nebulization BID  . calcitRIOL  0.25 mcg  Oral QODAY  . docusate sodium  100 mg Oral BID  . doxycycline  100 mg Oral Q12H  . enoxaparin (LOVENOX) injection  30 mg Subcutaneous QHS  . fluticasone  2 spray Each Nare Daily  . furosemide  80 mg Oral Daily  . insulin aspart  0-20 Units Subcutaneous TID WC  . insulin aspart  0-5 Units Subcutaneous QHS  . insulin glargine  14 Units Subcutaneous QHS  . ipratropium-albuterol  3 mL Nebulization TID  . isosorbide mononitrate  30 mg Oral Daily  . loratadine  10 mg Oral Daily  . methylPREDNISolone (SOLU-MEDROL) injection  60 mg Intravenous Q24H  . pantoprazole  40 mg Oral Daily  . polyethylene glycol  17 g Oral BID  . terbinafine  1 application Topical BID   Continuous Infusions:   LOS: 5 days    Time spent: 35 minutes    Penny Pia, MD [facility] Pager [phone]  If 7PM-7AM, please contact night-coverage [email or web address] Password [facility] [address] 10/24/2017, 5:21 PM

## 2017-10-24 NOTE — Progress Notes (Signed)
Physical Therapy Treatment Patient Details Name: Greg Jones MRN: [id number] DOB: 19-Dec-1927 Today's Date: 10/24/2017    History of Present Illness Pt was admitted with COPD with acute exacerbation.  PMH:  CKD, CHF, HTN, DM    PT Comments    Pt  Progressing toward goals; will benefit from short term SNF  Post acute; continue PT POC  Follow Up Recommendations  SNF;Supervision for mobility/OOB     Equipment Recommendations  None recommended by PT    Recommendations for Other Services       Precautions / Restrictions Precautions Precautions: Fall Precaution Comments: monitor sats Restrictions Weight Bearing Restrictions: No    Mobility  Bed Mobility Overal bed mobility: Needs Assistance Bed Mobility: Supine to Sit     Supine to sit: Supervision     General bed mobility comments: able to self perfom with increased time  Transfers Overall transfer level: Needs assistance Equipment used: Rolling walker (2 wheeled) Transfers: Sit to/from Stand Sit to Stand: Supervision         General transfer comment: cues for overall safety, hand placement  Ambulation/Gait Ambulation/Gait assistance: Supervision Ambulation Distance (Feet): 80 Feet(x2) Assistive device: 4-wheeled walker Gait Pattern/deviations: Step-through pattern;Decreased stride length;Trunk flexed     General Gait Details: tolerated increased distances.  Self awareness activity limitations.  Utilized rollator for sitting rest breaks.  Performed self purse lip breathing and trunk extension with cues; SpO2= 93% and HR 90   Stairs             Wheelchair Mobility    Modified Rankin (Stroke Patients Only)       Balance     Sitting balance-Leahy Scale: Fair       Standing balance-Leahy Scale: Poor                              Cognition Arousal/Alertness: Awake/alert Behavior During Therapy: WFL for tasks assessed/performed Overall Cognitive Status: Within Functional Limits  for tasks assessed                                 General Comments: pt appears at prior level, pleasant      Exercises      General Comments        Pertinent Vitals/Pain Pain Assessment: No/denies pain    Home Living                      Prior Function            PT Goals (current goals can now be found in the care plan section) Acute Rehab PT Goals Patient Stated Goal: none stated; agreeable to OOB PT Goal Formulation: With patient Time For Goal Achievement: 11/02/17 Potential to Achieve Goals: Good Progress towards PT goals: Progressing toward goals    Frequency    Min 3X/week      PT Plan Current plan remains appropriate    Co-evaluation              AM-PAC PT "6 Clicks" Daily Activity  Outcome Measure  Difficulty turning over in bed (including adjusting bedclothes, sheets and blankets)?: A Little Difficulty moving from lying on back to sitting on the side of the bed? : A Little Difficulty sitting down on and standing up from a chair with arms (e.g., wheelchair, bedside commode, etc,.)?: A Little Help needed moving to and from a  bed to chair (including a wheelchair)?: A Little Help needed walking in hospital room?: A Little Help needed climbing 3-5 steps with a railing? : A Lot 6 Click Score: 17    End of Session Equipment Utilized During Treatment: Gait belt Activity Tolerance: Patient tolerated treatment well Patient left: in chair;with call bell/phone within reach;with chair alarm set   PT Visit Diagnosis: Difficulty in walking, not elsewhere classified (R26.2);Muscle weakness (generalized) (M62.81)     Time: 1610-9604 PT Time Calculation (min) (ACUTE ONLY): 32 min  Charges:  $Gait Training: 23-37 mins                    G CodesDrucilla Chalet, PT Pager: [phone] 10/24/2017    Drucilla Chalet 10/24/2017, 2:09 PM

## 2017-10-25 LAB — GLUCOSE, CAPILLARY
GLUCOSE-CAPILLARY: 97 mg/dL (ref 65–99)
Glucose-Capillary: 60 mg/dL — ABNORMAL LOW (ref 65–99)
Glucose-Capillary: 69 mg/dL (ref 65–99)
Glucose-Capillary: 271 mg/dL — ABNORMAL HIGH (ref 65–99)

## 2017-10-25 MED ORDER — DOXYCYCLINE HYCLATE 100 MG PO TABS: 100.0000 mg | ORAL_TABLET | Freq: Two times a day (BID) | ORAL | 0 refills | Status: DC

## 2017-10-25 MED ORDER — PREDNISONE 10 MG (21) PO TBPK: ORAL_TABLET | ORAL | 0 refills | Status: DC

## 2017-10-25 NOTE — Progress Notes (Signed)
Patient to be transferred to [facility], attempted to give report, no answer

## 2017-10-25 NOTE — Progress Notes (Signed)
[facility] arranged for transport. ETA: 1 HOUR Nurse given the number to call report: [phone] Patient sister informed about patient transport to SNF.   Vivi Barrack, Theresia Majors, MSW Clinical Social Worker  [phone] 10/25/2017  3:54 PM

## 2017-10-25 NOTE — Discharge Summary (Signed)
Physician Discharge Summary  Greg Jones MVH:[id number] DOB: 1928/05/07 DOA: 10/18/2017  PCP: Deeann Saint, MD  Admit date: 10/18/2017 Discharge date: 10/25/2017  Time spent: > 35 minutes  Recommendations for Outpatient Follow-up:  1. Patient to complete 1 more day of antibiotic with doxycycline 2. Complete a prednisone taper 3. Ensure patient follows up with dermatology and pulmonology   Discharge Diagnoses:  Principal Problem:   COPD with acute exacerbation (HCC) Active Problems:   Essential hypertension   Chronic combined systolic and diastolic CHF (congestive heart failure) (HCC)   CKD (chronic kidney disease), stage IV (HCC)   Type 2 diabetes mellitus with other specified complication [facility])   Discharge Condition: stable  Diet recommendation: renal diet  Filed Weights   10/23/17 0625 10/24/17 0525 10/25/17 0500  Weight: 79 kg (174 lb 2.6 oz) 81.1 kg (178 lb 12.7 oz) 79.7 kg (175 lb 11.3 oz)    History of present illness:  84 y.o. male with medical history significant of COPD; CKD; CHF; HTN; and GERD presenting with SOB.  He reports that he is here for SOB.  Dx and treated for copd exacerbation   Hospital Course:  #1 acute COPD exacerbation Patient presented with mild acute COPD exacerbation with and wheezing. Improved after treatment for copd exacerbation. Discharging on prednisone taper and 1 more day of antibiotic. Pt to continue prior to admission medication regimen  2.  Hypertension -Continue prior to admission medication regimen  3.  Chronic kidney disease stage IV - Stable.    Continue prior to admission medication regimen  4.  Type 2 diabetes -Recommend continued monitoring and continuing prior to admission medication regimen  5.  Chronic combined systolic and diastolic CHF -Continue prior to admission medication regimen.  Stable  6. Pruritic rash - Outpatient follow-up with  dermatology.   Procedures:  none  Consultations:  Pulmonology  Discharge Exam: Vitals:   10/25/17 0926 10/25/17 1251  BP:  132/61  Pulse:  77  Resp:  18  Temp:  98.2 F (36.8 C)  SpO2: 94% 95%    General: Pt in nad, alert and awake Cardiovascular: rrr, no rubs Respiratory: no increased wob, no wheezes  Discharge Instructions   Discharge Instructions    Call MD for:  difficulty breathing, headache or visual disturbances   Complete by:  As directed    Call MD for:  temperature >100.4   Complete by:  As directed    Diet - low sodium heart healthy   Complete by:  As directed    Discharge instructions   Complete by:  As directed    Please ensure patient follows up with his primary care physician   Increase activity slowly   Complete by:  As directed      Allergies as of 10/25/2017      Reactions   Other Other (See Comments)   Oxygen Sensitive   Penicillins Rash, Other (See Comments)   Any -illins as well as Mycins Has patient had a PCN reaction causing immediate rash, facial/tongue/throat swelling, SOB or lightheadedness with hypotension: Yes Has patient had a PCN reaction causing severe rash involving mucus membranes or skin necrosis: Yes Has patient had a PCN reaction that required hospitalization No Has patient had a PCN reaction occurring within the last 10 years: No If all of the above answers are "NO", then may proceed with Cephalosporin use.   Vitamin B12 Itching, Rash, Other (See Comments)   injections      Medication List  STOP taking these medications   predniSONE 10 MG tablet Commonly known as:  DELTASONE Replaced by:  predniSONE 10 MG (21) Tbpk tablet     TAKE these medications   albuterol (2.5 MG/3ML) 0.083% nebulizer solution Commonly known as:  PROVENTIL Take 3 mLs (2.5 mg total) by nebulization every 6 (six) hours. DX:  J44.9   albuterol 108 (90 Base) MCG/ACT inhaler Commonly known as:  PROVENTIL HFA;VENTOLIN HFA Inhale 2 puffs into  the lungs every 6 (six) hours as needed for wheezing or shortness of breath.   amLODipine 5 MG tablet Commonly known as:  NORVASC Take 1 tablet (5 mg total) by mouth daily. What changed:  how much to take   aspirin EC 81 MG tablet Take 81 mg by mouth daily.   budesonide-formoterol 160-4.5 MCG/ACT inhaler Commonly known as:  SYMBICORT Inhale 2 puffs into the lungs 2 (two) times daily.   calcitRIOL 0.25 MCG capsule Commonly known as:  ROCALTROL Take 0.25 mcg by mouth every other day.   camphor-menthol lotion Commonly known as:  SARNA Apply 1 application topically as needed for itching.   doxycycline 100 MG tablet Commonly known as:  VIBRA-TABS Take 1 tablet (100 mg total) by mouth every 12 (twelve) hours.   furosemide 40 MG tablet Commonly known as:  LASIX Take 80 mg by mouth daily.   hydrOXYzine 25 MG tablet Commonly known as:  ATARAX/VISTARIL Take 1 tablet (25 mg total) by mouth 2 (two) times daily as needed for itching.   insulin glargine 100 UNIT/ML injection Commonly known as:  LANTUS Inject 14 Units into the skin at bedtime.   isosorbide mononitrate 30 MG 24 hr tablet Commonly known as:  IMDUR Take 30 mg by mouth daily.   omeprazole 20 MG capsule Commonly known as:  PRILOSEC Take 20 mg by mouth daily.   predniSONE 10 MG (21) Tbpk tablet Commonly known as:  STERAPRED UNI-PAK 21 TAB Take 40 mg po daily for the next 3 days, then take 20 mg po daily for the next 3 days, then take 10 mg po daily Replaces:  predniSONE 10 MG tablet   terbinafine 1 % cream Commonly known as:  LAMISIL AT Apply 1 application topically 2 (two) times daily.   tiotropium 18 MCG inhalation capsule Commonly known as:  SPIRIVA Place 18 mcg into inhaler and inhale daily.   triamcinolone cream 0.1 % Commonly known as:  KENALOG Apply 1 application topically 2 (two) times daily.            Durable Medical Equipment  (From admission, onward)        Start     Ordered   10/20/17  1723  For home use only DME Nebulizer machine  Once    Question:  Patient needs a nebulizer to treat with the following condition  Answer:  COPD (chronic obstructive pulmonary disease) with emphysema (HCC)   10/20/17 1722     Allergies  Allergen Reactions  . Other Other (See Comments)    Oxygen Sensitive  . Penicillins Rash and Other (See Comments)    Any -illins as well as Mycins Has patient had a PCN reaction causing immediate rash, facial/tongue/throat swelling, SOB or lightheadedness with hypotension: Yes Has patient had a PCN reaction causing severe rash involving mucus membranes or skin necrosis: Yes Has patient had a PCN reaction that required hospitalization No Has patient had a PCN reaction occurring within the last 10 years: No If all of the above answers are "NO", then may  proceed with Cephalosporin use.   . Vitamin B12 Itching, Rash and Other (See Comments)    injections    Contact information for follow-up providers    [facility].   Specialty:  Home Health Services Why:  nurse, physical therapy, occupational therapy- agency will call to arrange initial order Contact information: [address] [place] [address] [phone]            Contact information for after-discharge care    Destination    [facility] .   Service:  Skilled Nursing Contact information: [address] [place] [address] [phone]                   The results of significant diagnostics from this hospitalization (including imaging, microbiology, ancillary and laboratory) are listed below for reference.    Significant Diagnostic Studies: Dg Chest 2 View  Result Date: 10/18/2017 CLINICAL DATA:  Shortness of breath.  COPD. EXAM: CHEST - 2 VIEW COMPARISON:  10/03/2017. FINDINGS: Mediastinum and hilar structures normal. Cardiomegaly with normal pulmonary vascularity. No acute infiltrates. Low lung  volumes with mild basilar atelectasis. No pleural effusion or pneumothorax. IMPRESSION: Cardiomegaly. No pulmonary venous congestion. No acute infiltrates. Mild bibasilar subsegmental atelectasis. Electronically Signed   By: Maisie Fus  Register   On: 10/18/2017 14:24   Dg Chest 2 View  Result Date: 10/03/2017 CLINICAL DATA:  Shortness of breath EXAM: CHEST - 2 VIEW COMPARISON:  CT chest of 08/18/2017 and chest x-ray of 08/14/2016 FINDINGS: No pneumonia or effusion is seen. Lungs remain somewhat hyperaerated. Mediastinal and hilar contours are stable and heart size is stable. No bony abnormality is seen. IMPRESSION: No active cardiopulmonary disease. Electronically Signed   By: Dwyane Dee M.D.   On: 10/03/2017 12:05   Dg Chest Port 1 View  Result Date: 10/22/2017 CLINICAL DATA:  Dyspnea EXAM: PORTABLE CHEST 1 VIEW COMPARISON:  10/18/2017 FINDINGS: Stable cardiomegaly. No aortic aneurysm. Mild pulmonary vascular congestion. No pulmonary consolidation, effusion or pneumothorax. Mild osteoarthritic change about both AC and glenohumeral joints. IMPRESSION: Stable cardiomegaly.  Mild vascular congestion. Electronically Signed   By: Tollie Eth M.D.   On: 10/22/2017 01:40    Microbiology: No results found for this or any previous visit (from the past 240 hour(s)).   Labs: Basic Metabolic Panel: Recent Labs  Lab 10/18/17 1611  10/20/17 0517 10/21/17 0508 10/22/17 0446 10/23/17 0445 10/24/17 0441  NA 147*   < > 140 138 142 147* 143  K 3.5   < > 4.0 3.8 3.4* 4.0 3.9  CL 103   < > 99* 100* 103 107 103  CO2 27   < > GLUCOSE 131*   < > 178* 204* 145* 96 105*  BUN 22*   < > 37* 43* 53* 51* 50*  CREATININE 2.47*   < > 2.30* 2.41* 2.51* 2.09* 2.10*  CALCIUM 9.9   < > 9.5 9.3 9.2 9.5 9.1  MG 2.2  --  2.6*  --   --   --   --    < > = values in this interval not displayed.   Liver Function Tests: No results for input(s): AST, ALT, ALKPHOS, BILITOT, PROT, ALBUMIN in the last 168  hours. No results for input(s): LIPASE, AMYLASE in the last 168 hours. No results for input(s): AMMONIA in the last 168 hours. CBC: Recent Labs  Lab 10/18/17 1611 10/19/17 0559 10/20/17 5784  10/21/17 0508 10/22/17 0446 10/24/17 0441  WBC 14.3* 12.6* 17.3* 13.5* 12.9* 14.3*  NEUTROABS 11.1*  --   --  11.8* 10.0*  --   HGB 15.9 12.5* 11.6* 12.5* 12.1* 12.6*  HCT 48.5 39.6 36.3* 39.5 37.9* 39.6  MCV 86.8 86.1 85.8 85.3 85.4 85.5  PLT 292 258 266 259 269 291   Cardiac Enzymes: No results for input(s): CKTOTAL, CKMB, CKMBINDEX, TROPONINI in the last 168 hours. BNP: BNP (last 3 results) Recent Labs    05/30/17 1447 08/14/17 1717 10/18/17 1611  BNP 66.8 96.7 147.6*    ProBNP (last 3 results) No results for input(s): PROBNP in the last 8760 hours.  CBG: Recent Labs  Lab 10/24/17 1735 10/24/17 2115 10/25/17 0816 10/25/17 0846 10/25/17 1205  GLUCAP 104* 116* 60* 69 97    Signed:  Penny Pia MD.  [facility] 10/25/2017, 3:30 PM

## 2017-10-26 ENCOUNTER — Non-Acute Institutional Stay (SKILLED_NURSING_FACILITY): Payer: Medicare Other | Admitting: Internal Medicine

## 2017-10-26 ENCOUNTER — Encounter: Payer: Self-pay | Admitting: Internal Medicine

## 2017-10-26 DIAGNOSIS — L282 Other prurigo: Secondary | ICD-10-CM

## 2017-10-26 DIAGNOSIS — I10 Essential (primary) hypertension: Secondary | ICD-10-CM

## 2017-10-26 DIAGNOSIS — N184 Chronic kidney disease, stage 4 (severe): Secondary | ICD-10-CM

## 2017-10-26 DIAGNOSIS — K219 Gastro-esophageal reflux disease without esophagitis: Secondary | ICD-10-CM

## 2017-10-26 DIAGNOSIS — Z794 Long term (current) use of insulin: Secondary | ICD-10-CM | POA: Diagnosis not present

## 2017-10-26 DIAGNOSIS — E1169 Type 2 diabetes mellitus with other specified complication: Secondary | ICD-10-CM | POA: Diagnosis not present

## 2017-10-26 DIAGNOSIS — J441 Chronic obstructive pulmonary disease with (acute) exacerbation: Secondary | ICD-10-CM

## 2017-10-26 NOTE — Progress Notes (Signed)
: Provider:  Merrilee Seashore. D., MD Location:  [facility] Room Number: 103-P Place of Service:  SNF ([phone])  PCP: Deeann Saint, MD Patient Care Team: Deeann Saint, MD as PCP - General (Family Medicine)  Extended Emergency Contact Information Primary Emergency Contact: [name] [place] Home Phone: [phone] Relation: Sister     Allergies: Other; Penicillins; and Vitamin b12  Chief Complaint  Patient presents with  . New Admit To SNF    New admission to [facility] SNF    HPI: Patient is 84 y.o. male with COPD, CKD, congestive heart failure, hypertension, and GERD who presented to [facility] emergency department with shortness of breath.  Shortness of breath for over a year seems to be getting shorter, coughing very little occasionally productive white sputum, no fever but is wheezing.  Patient also complained of a rash that started a month ago that had not responded to any over-the-counter interventions; patient is planned to be seeing a dermatologist in several days.  Patient is admitted to [facility] from 5/2-9 for acute COPD exacerbation. patient improved with nebs and prednisone for pruritic rash patient is to follow-up with outpatient dermatology.  Patient is admitted to skilled nursing facility for OT/PT.  While at skilled nursing facility patient will be followed for hypertension treated with Norvasc Lasix,  diabetes treated with insulin, and GERD treated with Prilosec.  Past Medical History:  Diagnosis Date  . CHF (congestive heart failure) (HCC)   . Chronic kidney disease   . COPD (chronic obstructive pulmonary disease) (HCC)   . GERD (gastroesophageal reflux disease)   . Hypertension   . Respiratory insufficiency     History reviewed. No pertinent surgical history.  Allergies as of 10/26/2017      Reactions   Other Other (See Comments)   Oxygen Sensitive   Penicillins Rash, Other (See  Comments)   Any -illins as well as Mycins Has patient had a PCN reaction causing immediate rash, facial/tongue/throat swelling, SOB or lightheadedness with hypotension: Yes Has patient had a PCN reaction causing severe rash involving mucus membranes or skin necrosis: Yes Has patient had a PCN reaction that required hospitalization No Has patient had a PCN reaction occurring within the last 10 years: No If all of the above answers are "NO", then may proceed with Cephalosporin use.   Vitamin B12 Itching, Rash, Other (See Comments)   injections      Medication List        Accurate as of 10/26/17 10:01 AM. Always use your most recent med list.          albuterol (2.5 MG/3ML) 0.083% nebulizer solution Commonly known as:  PROVENTIL Take 3 mLs (2.5 mg total) by nebulization every 6 (six) hours. DX:  J44.9   albuterol 108 (90 Base) MCG/ACT inhaler Commonly known as:  PROVENTIL HFA;VENTOLIN HFA Inhale 2 puffs into the lungs every 6 (six) hours as needed for wheezing or shortness of breath.   amLODipine 5 MG tablet Commonly known as:  NORVASC Take 1 tablet (5 mg total) by mouth daily.   aspirin EC 81 MG tablet Take 81 mg by mouth daily.   budesonide-formoterol 160-4.5 MCG/ACT inhaler Commonly known as:  SYMBICORT Inhale 2 puffs into the lungs 2 (two) times daily.   calcitRIOL 0.25 MCG capsule Commonly known as:  ROCALTROL Take 0.25 mcg by mouth every other day.   camphor-menthol lotion Commonly known as:  SARNA Apply 1 application  topically as needed for itching.   doxycycline 100 MG tablet Commonly known as:  VIBRA-TABS Take 1 tablet (100 mg total) by mouth every 12 (twelve) hours.   furosemide 80 MG tablet Commonly known as:  LASIX Take 80 mg by mouth daily.   hydrOXYzine 25 MG tablet Commonly known as:  ATARAX/VISTARIL Take 1 tablet (25 mg total) by mouth 2 (two) times daily as needed for itching.   insulin glargine 100 UNIT/ML injection Commonly known as:   LANTUS Inject 14 Units into the skin at bedtime.   isosorbide mononitrate 30 MG 24 hr tablet Commonly known as:  IMDUR Take 30 mg by mouth daily.   omeprazole 20 MG capsule Commonly known as:  PRILOSEC Take 20 mg by mouth daily.   predniSONE 10 MG (21) Tbpk tablet Commonly known as:  STERAPRED UNI-PAK 21 TAB Take 40 mg po daily for the next 3 days, then take 20 mg po daily for the next 3 days, then take 10 mg po daily   terbinafine 1 % cream Commonly known as:  LAMISIL AT Apply 1 application topically 2 (two) times daily.   tiotropium 18 MCG inhalation capsule Commonly known as:  SPIRIVA Place 18 mcg into inhaler and inhale daily.   triamcinolone cream 0.1 % Commonly known as:  KENALOG Apply 1 application topically 2 (two) times daily.       No orders of the defined types were placed in this encounter.   Immunization History  Administered Date(s) Administered  . Influenza, High Dose Seasonal PF 05/06/2017  . Influenza,inj,Quad PF,6+ Mos 02/15/2016  . Influenza-Unspecified 05/03/2015  . Pneumococcal Conjugate-13 04/19/2016  . Pneumococcal Polysaccharide-23 09/18/2015    Social History   Tobacco Use  . Smoking status: Former Smoker    Packs/day: 0.50    Years: 70.00    Pack years: 35.00    Types: Cigarettes    Last attempt to quit: 04/19/2013    Years since quitting: 4.5  . Smokeless tobacco: Never Used  Substance Use Topics  . Alcohol use: No    Alcohol/week: 0.0 oz    Family history is   Family History  Problem Relation Age of Onset  . Tuberculosis Mother   . Diabetes Father       Review of Systems  DATA OBTAINED: from patient GENERAL:  no fevers, fatigue, appetite changes SKIN: No itching, or rash EYES: No eye pain, redness, discharge EARS: No earache, tinnitus, change in hearing NOSE: No congestion, drainage or bleeding  MOUTH/THROAT: No mouth or tooth pain, No sore throat RESPIRATORY: No cough, wheezing, SOB CARDIAC: No chest pain,  palpitations, lower extremity edema  GI: No abdominal pain, No N/V/D or constipation, No heartburn or reflux  GU: No dysuria, frequency or urgency, or incontinence  MUSCULOSKELETAL: No unrelieved bone/joint pain NEUROLOGIC: No headache, dizziness or focal weakness PSYCHIATRIC: No c/o anxiety or sadness   Vitals:   10/26/17 0939  BP: 136/68  Pulse: 81  Resp: 18  Temp: 98 F (36.7 C)  SpO2: 96%    SpO2 Readings from Last 1 Encounters:  10/26/17 96%   Body mass index is 27.52 kg/m.     Physical Exam  GENERAL APPEARANCE: Alert, conversant,  No acute distress.  SKIN: Diffuse, smooth, nonspecific, minimally erythemic lateral looking rash on trunk back abdomen arms HEAD: Normocephalic, atraumatic  EYES: Conjunctiva/lids clear. Pupils round, reactive. EOMs intact.  EARS: External exam WNL, canals clear. Hearing grossly normal.  NOSE: No deformity or discharge.  MOUTH/THROAT: Lips w/o lesions  RESPIRATORY: Breathing is even, unlabored. Lung sounds are clear   CARDIOVASCULAR: Heart RRR no murmurs, rubs or gallops. No peripheral edema.   GASTROINTESTINAL: Abdomen is soft, non-tender, not distended w/ normal bowel sounds. GENITOURINARY: Bladder non tender, not distended  MUSCULOSKELETAL: No abnormal joints or musculature NEUROLOGIC:  Cranial nerves 2-12 grossly intact. Moves all extremities  PSYCHIATRIC: Mood and affect appropriate to situation, no behavioral issues  Patient Active Problem List   Diagnosis Date Noted  . COPD with acute exacerbation (HCC) 10/18/2017  . Type 2 diabetes mellitus with other specified complication (HCC) 10/12/2017  . Pruritic rash 08/14/2017  . Pain and swelling of toe of left foot 08/14/2017  . Pressure injury of skin 04/12/2016  . Near syncope 04/11/2016  . Pre-syncope 04/11/2016  . CKD (chronic kidney disease), stage IV (HCC) 04/11/2016  . QT prolongation 04/11/2016  . Leukocytosis 04/11/2016  . Hypokalemia 04/11/2016  . Blood glucose  elevated 04/11/2016  . Chronic combined systolic and diastolic CHF (congestive heart failure) (HCC) 01/11/2016  . Dyspnea   . COPD exacerbation (HCC) 09/07/2015  . GERD (gastroesophageal reflux disease) 05/28/2015  . COPD (chronic obstructive pulmonary disease) (HCC) 05/10/2015  . Essential hypertension 05/10/2015      Labs reviewed: Basic Metabolic Panel:    Component Value Date/Time   NA 143 10/24/2017 0441   K 3.9 10/24/2017 0441   CL 103 10/24/2017 0441   CO2 25 10/24/2017 0441   GLUCOSE 105 (H) 10/24/2017 0441   BUN 50 (H) 10/24/2017 0441   CREATININE 2.10 (H) 10/24/2017 0441   CALCIUM 9.1 10/24/2017 0441   PROT 5.7 (L) 08/21/2017 0543   ALBUMIN 2.8 (L) 08/21/2017 0543   AST 34 08/21/2017 0543   ALT 67 (H) 08/21/2017 0543   ALKPHOS 51 08/21/2017 0543   BILITOT 0.6 08/21/2017 0543   GFRNONAA 26 (L) 10/24/2017 0441   GFRAA 31 (L) 10/24/2017 0441    Recent Labs    08/19/17 0502  10/18/17 1611  10/20/17 0517  10/22/17 0446 10/23/17 0445 10/24/17 0441  NA 137   < > 147*   < > 140   < > 142 147* 143  K 4.1   < > 3.5   < > 4.0   < > 3.4* 4.0 3.9  CL 96*   < > 103   < > 99*   < > 103 107 103  CO2 26   < > 27   < > 26   < > GLUCOSE 135*   < > 131*   < > 178*   < > 145* 96 105*  BUN 50*   < > 22*   < > 37*   < > 53* 51* 50*  CREATININE 2.20*   < > 2.47*   < > 2.30*   < > 2.51* 2.09* 2.10*  CALCIUM 9.4   < > 9.9   < > 9.5   < > 9.2 9.5 9.1  MG 2.4  --  2.2  --  2.6*  --   --   --   --    < > = values in this interval not displayed.   Liver Function Tests: Recent Labs    08/18/17 0413 08/19/17 0502 08/21/17 0543  AST 52* 85* 34  ALT 52 103* 67*  ALKPHOS 56 57 51  BILITOT 0.7 0.7 0.6  PROT 6.2* 6.8 5.7*  ALBUMIN 3.1* 3.5 2.8*   No results for input(s): LIPASE, AMYLASE in the last 8760  hours. No results for input(s): AMMONIA in the last 8760 hours. CBC: Recent Labs    10/18/17 1611  10/21/17 0508 10/22/17 0446 10/24/17 0441  WBC 14.3*   < >  13.5* 12.9* 14.3*  NEUTROABS 11.1*  --  11.8* 10.0*  --   HGB 15.9   < > 12.5* 12.1* 12.6*  HCT 48.5   < > 39.5 37.9* 39.6  MCV 86.8   < > 85.3 85.4 85.5  PLT 292   < > 259 269 291   < > = values in this interval not displayed.   Lipid No results for input(s): CHOL, HDL, LDLCALC, TRIG in the last 8760 hours.  Cardiac Enzymes: Recent Labs    08/14/17 2207 08/15/17 0746 08/15/17 1045  TROPONINI 0.03* 0.03* 0.03*   BNP: Recent Labs    05/30/17 1447 08/14/17 1717 10/18/17 1611  BNP 66.8 96.7 147.6*   No results found for: [facility] Lab Results  Component Value Date   HGBA1C 8.1 (H) 08/18/2017   Lab Results  Component Value Date   TSH 0.800 08/15/2017   No results found for: VITAMINB12 No results found for: FOLATE No results found for: IRON, TIBC, FERRITIN  Imaging and Procedures obtained prior to SNF admission: Dg Chest 2 View  Result Date: 10/18/2017 CLINICAL DATA:  Shortness of breath.  COPD. EXAM: CHEST - 2 VIEW COMPARISON:  10/03/2017. FINDINGS: Mediastinum and hilar structures normal. Cardiomegaly with normal pulmonary vascularity. No acute infiltrates. Low lung volumes with mild basilar atelectasis. No pleural effusion or pneumothorax. IMPRESSION: Cardiomegaly. No pulmonary venous congestion. No acute infiltrates. Mild bibasilar subsegmental atelectasis. Electronically Signed   By: Maisie Fus  Register   On: 10/18/2017 14:24     Not all labs, radiology exams or other studies done during hospitalization come through on my EPIC note; however they are reviewed by me.    Assessment and Plan  Acute COPD exacerbation- improved with nebs and prednisone taper and also doxycycline SNF -admitted for OT/PT; doxycycline 100 mg twice daily for 1 more day, prednisone taper 40 daily for 3 days, then 10 mg daily milligrams daily for 3 days, 20 mg  Pruritic rash SNF - - not sure if treated in the hospital with same meds patient discharged with triamcinolone 0.1% cream to apply  topically twice daily to trebinifine 1% cream to apply topically twice daily hydroxyzine as needed which I have changed to hydroxyzine 25 mg 3 times daily scheduled and Sarna cream as needed which I changed to schedule daily; patient follow-up with dermatology  Hypertension SNF -controlled; continue Norvasc 5 mg daily Lasix 80 mg daily indoor 30 mg daily  Chronic kidney disease stage IV SNF -baseline creatinine appears to be 2.1-2.5; will follow up BMP; continue calcitriol 0.25 mcg every other day  Type 2 diabetes SNF -control; continue Lantus 14 units nightly  GERD SNF control; continue omeprazole 20 mg daily    Time spent greater than 45 minutes;> 50% of time with patient was spent reviewing records, labs, tests and studies, counseling and developing plan of care  Thurston Hole D. [name], MD

## 2017-10-27 ENCOUNTER — Encounter: Payer: Self-pay | Admitting: Internal Medicine

## 2017-10-29 LAB — CBC AND DIFFERENTIAL
HCT: 37 — AB (ref 41–53)
HEMOGLOBIN: 12 — AB (ref 13.5–17.5)
Platelets: 351 (ref 150–399)
WBC: 16.7

## 2017-11-06 ENCOUNTER — Non-Acute Institutional Stay (SKILLED_NURSING_FACILITY): Payer: Medicare Other | Admitting: Internal Medicine

## 2017-11-06 ENCOUNTER — Encounter: Payer: Self-pay | Admitting: Internal Medicine

## 2017-11-06 DIAGNOSIS — R609 Edema, unspecified: Secondary | ICD-10-CM

## 2017-11-06 DIAGNOSIS — R635 Abnormal weight gain: Secondary | ICD-10-CM | POA: Diagnosis not present

## 2017-11-06 DIAGNOSIS — B372 Candidiasis of skin and nail: Secondary | ICD-10-CM | POA: Diagnosis not present

## 2017-11-06 NOTE — Progress Notes (Signed)
Location:  [facility] Room Number: 103-P Place of Service:  SNF (31)  Greg Jones. Greg Jones, M.D.   Patient Care Team: Deeann Saint, MD as PCP - General [facility] Medicine)  Extended Emergency Contact Information Primary Emergency Contact: [name] [place] of [place] Home Phone: [phone] Relation: Sister    Allergies: Other; Penicillins; and Vitamin b12  Chief Complaint  Patient presents with  . Acute Visit    Weight Gain     HPI: Patient is 84 y.o. male who seen for a 5 pound weight gain in the presence of 2+ pedal edema..  Patient denies shortness of breath or chest pain.  Patient also complains of a rash in his groin and antecubital fossa which is quite itchy, onset for several days to week.  Past Medical History:  Diagnosis Date  . CHF (congestive heart failure) (HCC)   . Chronic kidney disease   . COPD (chronic obstructive pulmonary disease) (HCC)   . GERD (gastroesophageal reflux disease)   . Hypertension   . Respiratory insufficiency     History reviewed. No pertinent surgical history.  Allergies as of 11/06/2017      Reactions   Other Other (See Comments)   Oxygen Sensitive   Penicillins Rash, Other (See Comments)   Any -illins as well as Mycins Has patient had a PCN reaction causing immediate rash, facial/tongue/throat swelling, SOB or lightheadedness with hypotension: Yes Has patient had a PCN reaction causing severe rash involving mucus membranes or skin necrosis: Yes Has patient had a PCN reaction that required hospitalization No Has patient had a PCN reaction occurring within the last 10 years: No If all of the above answers are "NO", then may proceed with Cephalosporin use.   Vitamin B12 Itching, Rash, Other (See Comments)   injections      Medication List        Accurate as of 11/06/17  2:28 PM. Always use your most recent med list.          albuterol (2.5 MG/3ML) 0.083% nebulizer  solution Commonly known as:  PROVENTIL Take 3 mLs (2.5 mg total) by nebulization every 6 (six) hours. DX:  J44.9   albuterol 108 (90 Base) MCG/ACT inhaler Commonly known as:  PROVENTIL HFA;VENTOLIN HFA Inhale 2 puffs into the lungs every 6 (six) hours as needed for wheezing or shortness of breath.   amLODipine 5 MG tablet Commonly known as:  NORVASC Take 1 tablet (5 mg total) by mouth daily.   aspirin EC 81 MG tablet Take 81 mg by mouth daily.   budesonide-formoterol 160-4.5 MCG/ACT inhaler Commonly known as:  SYMBICORT Inhale 2 puffs into the lungs 2 (two) times daily.   calcitRIOL 0.25 MCG capsule Commonly known as:  ROCALTROL Take 0.25 mcg by mouth every other day.   camphor-menthol lotion Commonly known as:  SARNA Apply 1 application topically as needed for itching.   furosemide 80 MG tablet Commonly known as:  LASIX Take 80 mg by mouth daily.   hydrOXYzine 25 MG tablet Commonly known as:  ATARAX/VISTARIL Take 25 mg by mouth every 6 (six) hours. Itching   insulin glargine 100 UNIT/ML injection Commonly known as:  LANTUS Inject 14 Units into the skin at bedtime.   isosorbide mononitrate 30 MG 24 hr tablet Commonly known as:  IMDUR Take 30 mg by mouth daily.   omeprazole 20 MG capsule Commonly known as:  PRILOSEC Take 20 mg by mouth daily.   predniSONE 10 MG (21)  Tbpk tablet Commonly known as:  STERAPRED UNI-PAK 21 TAB Take 40 mg po daily for the next 3 days, then take 20 mg po daily for the next 3 days, then take 10 mg po daily   terbinafine 1 % cream Commonly known as:  LAMISIL AT Apply 1 application topically 2 (two) times daily.   tiotropium 18 MCG inhalation capsule Commonly known as:  SPIRIVA Place 18 mcg into inhaler and inhale daily.   triamcinolone cream 0.1 % Commonly known as:  KENALOG Apply 1 application topically 2 (two) times daily.       No orders of the defined types were placed in this encounter.   Immunization History   Administered Date(s) Administered  . Influenza, High Dose Seasonal PF 05/06/2017  . Influenza,inj,Quad PF,6+ Mos 02/15/2016  . Influenza-Unspecified 05/03/2015  . Pneumococcal Conjugate-13 04/19/2016  . Pneumococcal Polysaccharide-23 09/18/2015    Social History   Tobacco Use  . Smoking status: Former Smoker    Packs/day: 0.50    Years: 70.00    Pack years: 35.00    Types: Cigarettes    Last attempt to quit: 04/19/2013    Years since quitting: 4.5  . Smokeless tobacco: Never Used  Substance Use Topics  . Alcohol use: No    Alcohol/week: 0.0 oz    Review of Systems  DATA OBTAINED: from patient, nurse GENERAL:  no fevers, fatigue, appetite changes SKIN: Rash bilateral groin and bilateral antecubital fossa HEENT: No complaint RESPIRATORY: No cough, wheezing, SOB CARDIAC: No chest pain, palpitations, lower extremity edema  GI: No abdominal pain, No N/V/D or constipation, No heartburn or reflux  GU: No dysuria, frequency or urgency, or incontinence  MUSCULOSKELETAL: No unrelieved bone/joint pain NEUROLOGIC: No headache, dizziness  PSYCHIATRIC: No overt anxiety or sadness  Vitals:   11/06/17 1409  BP: 135/76  Pulse: 88  Resp: (!) 22  Temp: (!) 97.5 F (36.4 C)   Body mass index is 28.32 kg/m. Physical Exam  GENERAL APPEARANCE: Alert, conversant, No acute distress  SKIN:  Rash in bilateral groin and bilateral antecubital fossa that appears to be yeast HEENT: Unremarkable RESPIRATORY: Breathing is even, unlabored. Lung sounds are clear   CARDIOVASCULAR: Heart RRR no murmurs, rubs or gallops. 2+ peripheral edema  GASTROINTESTINAL: Abdomen is soft, non-tender, not distended w/ normal bowel sounds.  GENITOURINARY: Bladder non tender, not distended  MUSCULOSKELETAL: No abnormal joints or musculature NEUROLOGIC: Cranial nerves 2-12 grossly intact. Moves all extremities PSYCHIATRIC: Mood and affect appropriate to situation, no behavioral issues  Patient Active Problem  List   Diagnosis Date Noted  . COPD with acute exacerbation (HCC) 10/18/2017  . Type 2 diabetes mellitus with other specified complication (HCC) 10/12/2017  . Pruritic rash 08/14/2017  . Pain and swelling of toe of left foot 08/14/2017  . Pressure injury of skin 04/12/2016  . Near syncope 04/11/2016  . Pre-syncope 04/11/2016  . CKD (chronic kidney disease), stage IV (HCC) 04/11/2016  . QT prolongation 04/11/2016  . Leukocytosis 04/11/2016  . Hypokalemia 04/11/2016  . Blood glucose elevated 04/11/2016  . Chronic combined systolic and diastolic CHF (congestive heart failure) (HCC) 01/11/2016  . Dyspnea   . COPD exacerbation (HCC) 09/07/2015  . GERD (gastroesophageal reflux disease) 05/28/2015  . COPD (chronic obstructive pulmonary disease) (HCC) 05/10/2015  . Essential hypertension 05/10/2015    CMP     Component Value Date/Time   NA 143 10/24/2017 0441   K 3.9 10/24/2017 0441   CL 103 10/24/2017 0441   CO2 25 10/24/2017  0441   GLUCOSE 105 (H) 10/24/2017 0441   BUN 50 (H) 10/24/2017 0441   CREATININE 2.10 (H) 10/24/2017 0441   CALCIUM 9.1 10/24/2017 0441   PROT 5.7 (L) 08/21/2017 0543   ALBUMIN 2.8 (L) 08/21/2017 0543   AST 34 08/21/2017 0543   ALT 67 (H) 08/21/2017 0543   ALKPHOS 51 08/21/2017 0543   BILITOT 0.6 08/21/2017 0543   GFRNONAA 26 (L) 10/24/2017 0441   GFRAA 31 (L) 10/24/2017 0441   Recent Labs    08/19/17 0502  10/18/17 1611  10/20/17 0517  10/22/17 0446 10/23/17 0445 10/24/17 0441  NA 137   < > 147*   < > 140   < > 142 147* 143  K 4.1   < > 3.5   < > 4.0   < > 3.4* 4.0 3.9  CL 96*   < > 103   < > 99*   < > 103 107 103  CO2 26   < > 27   < > 26   < > GLUCOSE 135*   < > 131*   < > 178*   < > 145* 96 105*  BUN 50*   < > 22*   < > 37*   < > 53* 51* 50*  CREATININE 2.20*   < > 2.47*   < > 2.30*   < > 2.51* 2.09* 2.10*  CALCIUM 9.4   < > 9.9   < > 9.5   < > 9.2 9.5 9.1  MG 2.4  --  2.2  --  2.6*  --   --   --   --    < > = values in this  interval not displayed.   Recent Labs    08/18/17 0413 08/19/17 0502 08/21/17 0543  AST 52* 85* 34  ALT 52 103* 67*  ALKPHOS 56 57 51  BILITOT 0.7 0.7 0.6  PROT 6.2* 6.8 5.7*  ALBUMIN 3.1* 3.5 2.8*   Recent Labs    10/18/17 1611  10/21/17 0508 10/22/17 0446 10/24/17 0441  WBC 14.3*   < > 13.5* 12.9* 14.3*  NEUTROABS 11.1*  --  11.8* 10.0*  --   HGB 15.9   < > 12.5* 12.1* 12.6*  HCT 48.5   < > 39.5 37.9* 39.6  MCV 86.8   < > 85.3 85.4 85.5  PLT 292   < > 259 269 291   < > = values in this interval not displayed.   No results for input(s): CHOL, LDLCALC, TRIG in the last 8760 hours.  Invalid input(s): HCL No results found for: MICROALBUR Lab Results  Component Value Date   TSH 0.800 08/15/2017   Lab Results  Component Value Date   HGBA1C 8.1 (H) 08/18/2017   No results found for: CHOL, HDL, LDLCALC, LDLDIRECT, TRIG, CHOLHDL  Significant Diagnostic Results in last 30 days:  Dg Chest 2 View  Result Date: 10/18/2017 CLINICAL DATA:  Shortness of breath.  COPD. EXAM: CHEST - 2 VIEW COMPARISON:  10/03/2017. FINDINGS: Mediastinum and hilar structures normal. Cardiomegaly with normal pulmonary vascularity. No acute infiltrates. Low lung volumes with mild basilar atelectasis. No pleural effusion or pneumothorax. IMPRESSION: Cardiomegaly. No pulmonary venous congestion. No acute infiltrates. Mild bibasilar subsegmental atelectasis. Electronically Signed   By: Maisie Fus  Register   On: 10/18/2017 14:24   Dg Chest Port 1 View  Result Date: 10/22/2017 CLINICAL DATA:  Dyspnea EXAM: PORTABLE CHEST 1 VIEW COMPARISON:  10/18/2017 FINDINGS: Stable cardiomegaly.  No aortic aneurysm. Mild pulmonary vascular congestion. No pulmonary consolidation, effusion or pneumothorax. Mild osteoarthritic change about both AC and glenohumeral joints. IMPRESSION: Stable cardiomegaly.  Mild vascular congestion. Electronically Signed   By: Tollie Eth M.D.   On: 10/22/2017 01:40    Assessment and  Plan  Weight gain/lower extremity edema- start Lasix 20 mg p.o. daily; labs today show that creatinine has decreased from 2.1-1.82; will follow up with BMP to follow creatinine  Dermatitis yeast- start Diflucan 100 mg p.o. daily for 10 days with Atarax every 6 as needed    Thurston Hole D. Greg Jones, M.D.

## 2017-11-08 ENCOUNTER — Non-Acute Institutional Stay (SKILLED_NURSING_FACILITY): Payer: Medicare Other | Admitting: Internal Medicine

## 2017-11-08 ENCOUNTER — Encounter: Payer: Self-pay | Admitting: Internal Medicine

## 2017-11-08 DIAGNOSIS — J441 Chronic obstructive pulmonary disease with (acute) exacerbation: Secondary | ICD-10-CM

## 2017-11-08 NOTE — Progress Notes (Signed)
Location:  [facility] Room Number: 103-P Place of Service:  SNF (31)  Greg Jones. [name], MD  Patient Care Team: Deeann Saint, MD as PCP - General [facility] Medicine)  Extended Emergency Contact Information Primary Emergency Contact: [name] [place] of [place] Home Phone: [phone] Relation: Sister    Allergies: Other; Penicillins; and Vitamin b12  Chief Complaint  Patient presents with  . Acute Visit    COPD concerns    HPI: Patient is 84 y.o. male who nursing asked me to see today for illness.  Patient was seen 2 days ago for weight gain with some edema but was well-appearing and feeling.  Today patient appears ill.  Auscultation reveals wheezing patient denies chest pain or cough.  Patient is O2 saturation is acceptable but less than his usual.  Past Medical History:  Diagnosis Date  . CHF (congestive heart failure) (HCC)   . Chronic kidney disease   . COPD (chronic obstructive pulmonary disease) (HCC)   . GERD (gastroesophageal reflux disease)   . Hypertension   . Respiratory insufficiency     History reviewed. No pertinent surgical history.  Allergies as of 11/08/2017      Reactions   Other Other (See Comments)   Oxygen Sensitive   Penicillins Rash, Other (See Comments)   Any -illins as well as Mycins Has patient had a PCN reaction causing immediate rash, facial/tongue/throat swelling, SOB or lightheadedness with hypotension: Yes Has patient had a PCN reaction causing severe rash involving mucus membranes or skin necrosis: Yes Has patient had a PCN reaction that required hospitalization No Has patient had a PCN reaction occurring within the last 10 years: No If all of the above answers are "NO", then may proceed with Cephalosporin use.   Vitamin B12 Itching, Rash, Other (See Comments)   injections      Medication List        Accurate as of 11/08/17  2:36 PM. Always use your most recent med list.          albuterol (2.5 MG/3ML) 0.083% nebulizer solution Commonly known as:  PROVENTIL Take 3 mLs (2.5 mg total) by nebulization every 6 (six) hours. DX:  J44.9   albuterol 108 (90 Base) MCG/ACT inhaler Commonly known as:  PROVENTIL HFA;VENTOLIN HFA Inhale 2 puffs into the lungs every 6 (six) hours as needed for wheezing or shortness of breath.   amLODipine 5 MG tablet Commonly known as:  NORVASC Take 1 tablet (5 mg total) by mouth daily.   aspirin EC 81 MG tablet Take 81 mg by mouth daily.   budesonide-formoterol 160-4.5 MCG/ACT inhaler Commonly known as:  SYMBICORT Inhale 2 puffs into the lungs 2 (two) times daily.   calcitRIOL 0.25 MCG capsule Commonly known as:  ROCALTROL Take 0.25 mcg by mouth every other day.   camphor-menthol lotion Commonly known as:  SARNA Apply 1 application topically as needed for itching.   furosemide 80 MG tablet Commonly known as:  LASIX Take 80 mg by mouth daily.   hydrOXYzine 25 MG tablet Commonly known as:  ATARAX/VISTARIL Take 25 mg by mouth every 6 (six) hours. Itching   insulin glargine 100 UNIT/ML injection Commonly known as:  LANTUS Inject 14 Units into the skin at bedtime.   isosorbide mononitrate 30 MG 24 hr tablet Commonly known as:  IMDUR Take 30 mg by mouth daily.   omeprazole 20 MG capsule Commonly known as:  PRILOSEC Take 20 mg by mouth daily.  predniSONE 10 MG (21) Tbpk tablet Commonly known as:  STERAPRED UNI-PAK 21 TAB Take 40 mg po daily for the next 3 days, then take 20 mg po daily for the next 3 days, then take 10 mg po daily   terbinafine 1 % cream Commonly known as:  LAMISIL AT Apply 1 application topically 2 (two) times daily.   tiotropium 18 MCG inhalation capsule Commonly known as:  SPIRIVA Place 18 mcg into inhaler and inhale daily.   triamcinolone cream 0.1 % Commonly known as:  KENALOG Apply 1 application topically 2 (two) times daily.       No orders of the defined types were placed in this  encounter.   Immunization History  Administered Date(s) Administered  . Influenza, High Dose Seasonal PF 05/06/2017  . Influenza,inj,Quad PF,6+ Mos 02/15/2016  . Influenza-Unspecified 05/03/2015  . Pneumococcal Conjugate-13 04/19/2016  . Pneumococcal Polysaccharide-23 09/18/2015    Social History   Tobacco Use  . Smoking status: Former Smoker    Packs/day: 0.50    Years: 70.00    Pack years: 35.00    Types: Cigarettes    Last attempt to quit: 04/19/2013    Years since quitting: 4.5  . Smokeless tobacco: Never Used  Substance Use Topics  . Alcohol use: No    Alcohol/week: 0.0 oz    Review of Systems  DATA OBTAINED: from patient, nurse GENERAL:  no fevers,+ fatigue, appetite changes SKIN: No itching, rash HEENT: No complaint RESPIRATORY: No cough, wheezing, +SOB CARDIAC: No chest pain, palpitations,+ lower extremity edema  GI: No abdominal pain, No N/V/D or constipation, No heartburn or reflux  GU: No dysuria, frequency or urgency, or incontinence  MUSCULOSKELETAL: No unrelieved bone/joint pain NEUROLOGIC: No headache, dizziness  PSYCHIATRIC: No overt anxiety or sadness  Vitals:   11/08/17 1420  BP: 135/78  Pulse: 90  Resp: 18  Temp: 98.2 F (36.8 C)  SpO2: 93%   Body mass index is 27.06 kg/m. Physical Exam  GENERAL APPEARANCE: Alert, conversant, mild distress  SKIN: No diaphoresis rash HEENT: Unremarkable RESPIRATORY: Breathing is even, slight labored. Lung sounds are wheezing with decent airflow CARDIOVASCULAR: Heart RRR no murmurs, rubs or gallops.  Improved peripheral edema  GASTROINTESTINAL: Abdomen is soft, non-tender, not distended w/ normal bowel sounds.  GENITOURINARY: Bladder non tender, not distended  MUSCULOSKELETAL: No abnormal joints or musculature NEUROLOGIC: Cranial nerves 2-12 grossly intact. Moves all extremities PSYCHIATRIC: Mood and affect appropriate to situation, no behavioral issues  Patient Active Problem List   Diagnosis Date  Noted  . COPD with acute exacerbation (HCC) 10/18/2017  . Type 2 diabetes mellitus with other specified complication (HCC) 10/12/2017  . Pruritic rash 08/14/2017  . Pain and swelling of toe of left foot 08/14/2017  . Pressure injury of skin 04/12/2016  . Near syncope 04/11/2016  . Pre-syncope 04/11/2016  . CKD (chronic kidney disease), stage IV (HCC) 04/11/2016  . QT prolongation 04/11/2016  . Leukocytosis 04/11/2016  . Hypokalemia 04/11/2016  . Blood glucose elevated 04/11/2016  . Chronic combined systolic and diastolic CHF (congestive heart failure) (HCC) 01/11/2016  . Dyspnea   . COPD exacerbation (HCC) 09/07/2015  . GERD (gastroesophageal reflux disease) 05/28/2015  . COPD (chronic obstructive pulmonary disease) (HCC) 05/10/2015  . Essential hypertension 05/10/2015    CMP     Component Value Date/Time   NA 143 10/24/2017 0441   K 3.9 10/24/2017 0441   CL 103 10/24/2017 0441   CO2 25 10/24/2017 0441   GLUCOSE 105 (H) 10/24/2017 0441  BUN 50 (H) 10/24/2017 0441   CREATININE 2.10 (H) 10/24/2017 0441   CALCIUM 9.1 10/24/2017 0441   PROT 5.7 (L) 08/21/2017 0543   ALBUMIN 2.8 (L) 08/21/2017 0543   AST 34 08/21/2017 0543   ALT 67 (H) 08/21/2017 0543   ALKPHOS 51 08/21/2017 0543   BILITOT 0.6 08/21/2017 0543   GFRNONAA 26 (L) 10/24/2017 0441   GFRAA 31 (L) 10/24/2017 0441   Recent Labs    08/19/17 0502  10/18/17 1611  10/20/17 0517  10/22/17 0446 10/23/17 0445 10/24/17 0441  NA 137   < > 147*   < > 140   < > 142 147* 143  K 4.1   < > 3.5   < > 4.0   < > 3.4* 4.0 3.9  CL 96*   < > 103   < > 99*   < > 103 107 103  CO2 26   < > 27   < > 26   < > GLUCOSE 135*   < > 131*   < > 178*   < > 145* 96 105*  BUN 50*   < > 22*   < > 37*   < > 53* 51* 50*  CREATININE 2.20*   < > 2.47*   < > 2.30*   < > 2.51* 2.09* 2.10*  CALCIUM 9.4   < > 9.9   < > 9.5   < > 9.2 9.5 9.1  MG 2.4  --  2.2  --  2.6*  --   --   --   --    < > = values in this interval not displayed.    Recent Labs    08/18/17 0413 08/19/17 0502 08/21/17 0543  AST 52* 85* 34  ALT 52 103* 67*  ALKPHOS 56 57 51  BILITOT 0.7 0.7 0.6  PROT 6.2* 6.8 5.7*  ALBUMIN 3.1* 3.5 2.8*   Recent Labs    10/18/17 1611  10/21/17 0508 10/22/17 0446 10/24/17 0441 10/29/17  WBC 14.3*   < > 13.5* 12.9* 14.3* 16.7  NEUTROABS 11.1*  --  11.8* 10.0*  --   --   HGB 15.9   < > 12.5* 12.1* 12.6* 12.0*  HCT 48.5   < > 39.5 37.9* 39.6 37*  MCV 86.8   < > 85.3 85.4 85.5  --   PLT 292   < > 259 269 291 351   < > = values in this interval not displayed.   No results for input(s): CHOL, LDLCALC, TRIG in the last 8760 hours.  Invalid input(s): HCL No results found for: MICROALBUR Lab Results  Component Value Date   TSH 0.800 08/15/2017   Lab Results  Component Value Date   HGBA1C 8.1 (H) 08/18/2017   No results found for: CHOL, HDL, LDLCALC, LDLDIRECT, TRIG, CHOLHDL  Significant Diagnostic Results in last 30 days:  Dg Chest 2 View  Result Date: 10/18/2017 CLINICAL DATA:  Shortness of breath.  COPD. EXAM: CHEST - 2 VIEW COMPARISON:  10/03/2017. FINDINGS: Mediastinum and hilar structures normal. Cardiomegaly with normal pulmonary vascularity. No acute infiltrates. Low lung volumes with mild basilar atelectasis. No pleural effusion or pneumothorax. IMPRESSION: Cardiomegaly. No pulmonary venous congestion. No acute infiltrates. Mild bibasilar subsegmental atelectasis. Electronically Signed   By: Maisie Fus  Register   On: 10/18/2017 14:24   Dg Chest Port 1 View  Result Date: 10/22/2017 CLINICAL DATA:  Dyspnea EXAM: PORTABLE CHEST 1 VIEW COMPARISON:  10/18/2017 FINDINGS: Stable  cardiomegaly. No aortic aneurysm. Mild pulmonary vascular congestion. No pulmonary consolidation, effusion or pneumothorax. Mild osteoarthritic change about both AC and glenohumeral joints. IMPRESSION: Stable cardiomegaly.  Mild vascular congestion. Electronically Signed   By: Tollie Eth M.D.   On: 10/22/2017 01:40    Assessment  and Plan  Acute COPD exacerbation- patient is started with steroids Solu-Medrol 125 mg IM and then a prednisone taper of 60/60/40/40/20/20/10; patient will be getting CBGs before meals with sliding scale insulin; DuoNeb every 4 hours x48 hours and albuterol nebs every 2 as needed; patient's O2 sat is greater than 90 but we are putting him on 2 L nasal cannula to decrease respiratory work; chest x-ray PA and lateral has been ordered; will follow-up results and clinical course    Time spent greater than 35 minutes Traylen Eckels D. [name], MD

## 2017-11-12 ENCOUNTER — Other Ambulatory Visit: Payer: Self-pay

## 2017-11-12 ENCOUNTER — Encounter (HOSPITAL_COMMUNITY): Payer: Self-pay | Admitting: Emergency Medicine

## 2017-11-12 ENCOUNTER — Inpatient Hospital Stay (HOSPITAL_COMMUNITY)
Admission: EM | Admit: 2017-11-12 | Discharge: 2017-11-17 | DRG: 194 | Disposition: A | Discharge status: Skilled Nursing Facility | Payer: Medicare Other | Attending: Internal Medicine | Admitting: Internal Medicine

## 2017-11-12 ENCOUNTER — Emergency Department (HOSPITAL_COMMUNITY): Payer: Medicare Other

## 2017-11-12 DIAGNOSIS — R0602 Shortness of breath: Secondary | ICD-10-CM | POA: Diagnosis present

## 2017-11-12 DIAGNOSIS — Y95 Nosocomial condition: Secondary | ICD-10-CM | POA: Diagnosis present

## 2017-11-12 DIAGNOSIS — E663 Overweight: Secondary | ICD-10-CM | POA: Diagnosis present

## 2017-11-12 DIAGNOSIS — I130 Hypertensive heart and chronic kidney disease with heart failure and stage 1 through stage 4 chronic kidney disease, or unspecified chronic kidney disease: Secondary | ICD-10-CM | POA: Diagnosis present

## 2017-11-12 DIAGNOSIS — N184 Chronic kidney disease, stage 4 (severe): Secondary | ICD-10-CM

## 2017-11-12 DIAGNOSIS — E1122 Type 2 diabetes mellitus with diabetic chronic kidney disease: Secondary | ICD-10-CM | POA: Diagnosis present

## 2017-11-12 DIAGNOSIS — Z7951 Long term (current) use of inhaled steroids: Secondary | ICD-10-CM

## 2017-11-12 DIAGNOSIS — J181 Lobar pneumonia, unspecified organism: Secondary | ICD-10-CM | POA: Diagnosis not present

## 2017-11-12 DIAGNOSIS — Z22322 Carrier or suspected carrier of Methicillin resistant Staphylococcus aureus: Secondary | ICD-10-CM

## 2017-11-12 DIAGNOSIS — E876 Hypokalemia: Secondary | ICD-10-CM

## 2017-11-12 DIAGNOSIS — L299 Pruritus, unspecified: Secondary | ICD-10-CM | POA: Diagnosis present

## 2017-11-12 DIAGNOSIS — I509 Heart failure, unspecified: Secondary | ICD-10-CM | POA: Diagnosis not present

## 2017-11-12 DIAGNOSIS — Z831 Family history of other infectious and parasitic diseases: Secondary | ICD-10-CM | POA: Diagnosis not present

## 2017-11-12 DIAGNOSIS — J440 Chronic obstructive pulmonary disease with acute lower respiratory infection: Secondary | ICD-10-CM | POA: Diagnosis present

## 2017-11-12 DIAGNOSIS — E1169 Type 2 diabetes mellitus with other specified complication: Secondary | ICD-10-CM | POA: Diagnosis not present

## 2017-11-12 DIAGNOSIS — J189 Pneumonia, unspecified organism: Principal | ICD-10-CM

## 2017-11-12 DIAGNOSIS — Z880 Allergy status to penicillin: Secondary | ICD-10-CM | POA: Diagnosis not present

## 2017-11-12 DIAGNOSIS — Z888 Allergy status to other drugs, medicaments and biological substances status: Secondary | ICD-10-CM

## 2017-11-12 DIAGNOSIS — I10 Essential (primary) hypertension: Secondary | ICD-10-CM | POA: Diagnosis not present

## 2017-11-12 DIAGNOSIS — Z794 Long term (current) use of insulin: Secondary | ICD-10-CM

## 2017-11-12 DIAGNOSIS — J9621 Acute and chronic respiratory failure with hypoxia: Secondary | ICD-10-CM | POA: Diagnosis not present

## 2017-11-12 DIAGNOSIS — K219 Gastro-esophageal reflux disease without esophagitis: Secondary | ICD-10-CM | POA: Diagnosis present

## 2017-11-12 DIAGNOSIS — L282 Other prurigo: Secondary | ICD-10-CM | POA: Diagnosis not present

## 2017-11-12 DIAGNOSIS — J441 Chronic obstructive pulmonary disease with (acute) exacerbation: Secondary | ICD-10-CM | POA: Diagnosis present

## 2017-11-12 DIAGNOSIS — R9431 Abnormal electrocardiogram [ECG] [EKG]: Secondary | ICD-10-CM | POA: Diagnosis not present

## 2017-11-12 DIAGNOSIS — Z87891 Personal history of nicotine dependence: Secondary | ICD-10-CM | POA: Diagnosis not present

## 2017-11-12 DIAGNOSIS — I5042 Chronic combined systolic (congestive) and diastolic (congestive) heart failure: Secondary | ICD-10-CM | POA: Diagnosis present

## 2017-11-12 DIAGNOSIS — Z7982 Long term (current) use of aspirin: Secondary | ICD-10-CM | POA: Diagnosis not present

## 2017-11-12 DIAGNOSIS — Z6827 Body mass index (BMI) 27.0-27.9, adult: Secondary | ICD-10-CM

## 2017-11-12 DIAGNOSIS — R0902 Hypoxemia: Secondary | ICD-10-CM | POA: Diagnosis present

## 2017-11-12 DIAGNOSIS — Z79899 Other long term (current) drug therapy: Secondary | ICD-10-CM

## 2017-11-12 DIAGNOSIS — Z833 Family history of diabetes mellitus: Secondary | ICD-10-CM

## 2017-11-12 DIAGNOSIS — R0603 Acute respiratory distress: Secondary | ICD-10-CM | POA: Diagnosis present

## 2017-11-12 DIAGNOSIS — R140 Abdominal distension (gaseous): Secondary | ICD-10-CM

## 2017-11-12 DIAGNOSIS — R0600 Dyspnea, unspecified: Secondary | ICD-10-CM

## 2017-11-12 LAB — COMPREHENSIVE METABOLIC PANEL
ALBUMIN: 3.4 g/dL — AB (ref 3.5–5.0)
ALT: 56 U/L (ref 17–63)
ANION GAP: 14 (ref 5–15)
AST: 37 U/L (ref 15–41)
Alkaline Phosphatase: 76 U/L (ref 38–126)
BUN: 33 mg/dL — ABNORMAL HIGH (ref 6–20)
CALCIUM: 9.2 mg/dL (ref 8.9–10.3)
CHLORIDE: 99 mmol/L — AB (ref 101–111)
CO2: 30 mmol/L (ref 22–32)
Creatinine, Ser: 1.98 mg/dL — ABNORMAL HIGH (ref 0.61–1.24)
GFR calc non Af Amer: 28 mL/min — ABNORMAL LOW (ref >60)
GFR, EST AFRICAN AMERICAN: 33 mL/min — AB (ref >60)
Glucose, Bld: 146 mg/dL — ABNORMAL HIGH (ref 65–99)
POTASSIUM: 3 mmol/L — AB (ref 3.5–5.1)
SODIUM: 143 mmol/L (ref 135–145)
Total Bilirubin: 0.6 mg/dL (ref 0.3–1.2)
Total Protein: 7.2 g/dL (ref 6.5–8.1)

## 2017-11-12 LAB — HEPATIC FUNCTION PANEL
ALK PHOS: 73 U/L (ref 38–126)
ALT: 57 U/L (ref 17–63)
AST: 41 U/L (ref 15–41)
Albumin: 3.2 g/dL — ABNORMAL LOW (ref 3.5–5.0)
BILIRUBIN TOTAL: 0.5 mg/dL (ref 0.3–1.2)
Bilirubin, Direct: 0.1 mg/dL (ref 0.1–0.5)
Indirect Bilirubin: 0.4 mg/dL (ref 0.3–0.9)
Total Protein: 6.8 g/dL (ref 6.5–8.1)

## 2017-11-12 LAB — CBC WITH DIFFERENTIAL/PLATELET
BASOS PCT: 0 %
Basophils Absolute: 0 10*3/uL (ref 0.0–0.1)
Eosinophils Absolute: 0 10*3/uL (ref 0.0–0.7)
Eosinophils Relative: 0 %
HEMATOCRIT: 42.5 % (ref 39.0–52.0)
Hemoglobin: 13.4 g/dL (ref 13.0–17.0)
LYMPHS ABS: 0.4 10*3/uL — AB (ref 0.7–4.0)
Lymphocytes Relative: 4 %
MCH: 26.6 pg (ref 26.0–34.0)
MCHC: 31.5 g/dL (ref 30.0–36.0)
MCV: 84.5 fL (ref 78.0–100.0)
MONO ABS: 0.2 10*3/uL (ref 0.1–1.0)
MONOS PCT: 2 %
Neutro Abs: 10.6 10*3/uL — ABNORMAL HIGH (ref 1.7–7.7)
Neutrophils Relative %: 94 %
PLATELETS: 276 10*3/uL (ref 150–400)
RBC: 5.03 MIL/uL (ref 4.22–5.81)
RDW: 17.8 % — AB (ref 11.5–15.5)
WBC: 11.2 10*3/uL — ABNORMAL HIGH (ref 4.0–10.5)

## 2017-11-12 LAB — BASIC METABOLIC PANEL
BUN: 32 — AB (ref 4–21)
Creatinine: 1.9 — AB (ref 0.6–1.3)
Glucose: 137
Potassium: 3.5 (ref 3.4–5.3)
SODIUM: 143 (ref 137–147)

## 2017-11-12 LAB — EXPECTORATED SPUTUM ASSESSMENT W GRAM STAIN, RFLX TO RESP C

## 2017-11-12 LAB — CBC
HCT: 43.5 % (ref 39.0–52.0)
Hemoglobin: 14 g/dL (ref 13.0–17.0)
MCH: 27.5 pg (ref 26.0–34.0)
MCHC: 32.2 g/dL (ref 30.0–36.0)
MCV: 85.3 fL (ref 78.0–100.0)
PLATELETS: 285 10*3/uL (ref 150–400)
RBC: 5.1 MIL/uL (ref 4.22–5.81)
RDW: 17.4 % — AB (ref 11.5–15.5)
WBC: 11.7 10*3/uL — ABNORMAL HIGH (ref 4.0–10.5)

## 2017-11-12 LAB — BRAIN NATRIURETIC PEPTIDE: B Natriuretic Peptide: 134.9 pg/mL — ABNORMAL HIGH (ref 0.0–100.0)

## 2017-11-12 LAB — PROCALCITONIN: Procalcitonin: 0.32 ng/mL

## 2017-11-12 LAB — MAGNESIUM: MAGNESIUM: 2 mg/dL (ref 1.7–2.4)

## 2017-11-12 LAB — GLUCOSE, CAPILLARY
GLUCOSE-CAPILLARY: 270 mg/dL — AB (ref 65–99)
Glucose-Capillary: 205 mg/dL — ABNORMAL HIGH (ref 65–99)

## 2017-11-12 LAB — I-STAT TROPONIN, ED: TROPONIN I, POC: 0.05 ng/mL (ref 0.00–0.08)

## 2017-11-12 LAB — STREP PNEUMONIAE URINARY ANTIGEN: Strep Pneumo Urinary Antigen: NEGATIVE

## 2017-11-12 LAB — EXPECTORATED SPUTUM ASSESSMENT W REFEX TO RESP CULTURE

## 2017-11-12 MED ORDER — ACETAMINOPHEN 325 MG PO TABS: 650.0000 mg | ORAL_TABLET | Freq: Four times a day (QID) | ORAL | Status: DC | PRN

## 2017-11-12 MED ORDER — INSULIN ASPART 100 UNIT/ML ~~LOC~~ SOLN
0.0000 [IU] | Freq: Every day | SUBCUTANEOUS | Status: DC
  Administered 2017-11-12 – 2017-11-13 (×2): 2 [IU] via SUBCUTANEOUS
  Administered 2017-11-16: 3 [IU] via SUBCUTANEOUS

## 2017-11-12 MED ORDER — ENOXAPARIN SODIUM 30 MG/0.3ML ~~LOC~~ SOLN
30.0000 mg | SUBCUTANEOUS | Status: DC
  Administered 2017-11-12: 30 mg via SUBCUTANEOUS
  Filled 2017-11-12: qty 0.3

## 2017-11-12 MED ORDER — CAMPHOR-MENTHOL 0.5-0.5 % EX LOTN
1.0000 "application " | TOPICAL_LOTION | CUTANEOUS | Status: DC | PRN
  Filled 2017-11-12: qty 222

## 2017-11-12 MED ORDER — SODIUM CHLORIDE 0.9 % IV SOLN: 1.0000 g | INTRAVENOUS | Status: DC

## 2017-11-12 MED ORDER — PANTOPRAZOLE SODIUM 40 MG PO TBEC
40.0000 mg | DELAYED_RELEASE_TABLET | Freq: Every day | ORAL | Status: DC
  Administered 2017-11-13 – 2017-11-17 (×5): 40 mg via ORAL
  Filled 2017-11-12 (×5): qty 1

## 2017-11-12 MED ORDER — GUAIFENESIN ER 600 MG PO TB12
600.0000 mg | ORAL_TABLET | Freq: Two times a day (BID) | ORAL | Status: DC
  Administered 2017-11-12 – 2017-11-17 (×10): 600 mg via ORAL
  Filled 2017-11-12 (×10): qty 1

## 2017-11-12 MED ORDER — INSULIN ASPART 100 UNIT/ML ~~LOC~~ SOLN
0.0000 [IU] | Freq: Three times a day (TID) | SUBCUTANEOUS | Status: DC
  Administered 2017-11-12: 11 [IU] via SUBCUTANEOUS
  Administered 2017-11-13: 15 [IU] via SUBCUTANEOUS
  Administered 2017-11-13: 3 [IU] via SUBCUTANEOUS
  Administered 2017-11-14: 7 [IU] via SUBCUTANEOUS
  Administered 2017-11-15: 3 [IU] via SUBCUTANEOUS
  Administered 2017-11-15: 7 [IU] via SUBCUTANEOUS
  Administered 2017-11-16: 11 [IU] via SUBCUTANEOUS
  Administered 2017-11-16: 3 [IU] via SUBCUTANEOUS

## 2017-11-12 MED ORDER — DOXYCYCLINE HYCLATE 100 MG PO TABS
100.0000 mg | ORAL_TABLET | Freq: Two times a day (BID) | ORAL | Status: DC
  Administered 2017-11-13 – 2017-11-17 (×9): 100 mg via ORAL
  Filled 2017-11-12 (×9): qty 1

## 2017-11-12 MED ORDER — IPRATROPIUM BROMIDE 0.02 % IN SOLN
0.5000 mg | Freq: Once | RESPIRATORY_TRACT | Status: AC
Start: 2017-11-12 — End: 2017-11-12
  Administered 2017-11-12: 0.5 mg via RESPIRATORY_TRACT
  Filled 2017-11-12: qty 2.5

## 2017-11-12 MED ORDER — ACETAMINOPHEN 650 MG RE SUPP: 650.0000 mg | Freq: Four times a day (QID) | RECTAL | Status: DC | PRN

## 2017-11-12 MED ORDER — ASPIRIN EC 81 MG PO TBEC
81.0000 mg | DELAYED_RELEASE_TABLET | Freq: Every day | ORAL | Status: DC
  Administered 2017-11-13 – 2017-11-17 (×5): 81 mg via ORAL
  Filled 2017-11-12 (×5): qty 1

## 2017-11-12 MED ORDER — INSULIN GLARGINE 100 UNIT/ML ~~LOC~~ SOLN
14.0000 [IU] | Freq: Every day | SUBCUTANEOUS | Status: DC
  Administered 2017-11-12 – 2017-11-16 (×5): 14 [IU] via SUBCUTANEOUS
  Filled 2017-11-12 (×6): qty 0.14

## 2017-11-12 MED ORDER — FUROSEMIDE 10 MG/ML IJ SOLN
40.0000 mg | Freq: Once | INTRAMUSCULAR | Status: AC
  Administered 2017-11-12: 40 mg via INTRAVENOUS
  Filled 2017-11-12: qty 4

## 2017-11-12 MED ORDER — FUROSEMIDE 40 MG PO TABS: 80.0000 mg | ORAL_TABLET | Freq: Every day | ORAL | Status: DC

## 2017-11-12 MED ORDER — METHYLPREDNISOLONE SODIUM SUCC 40 MG IJ SOLR
40.0000 mg | Freq: Every day | INTRAMUSCULAR | Status: DC
  Administered 2017-11-13 – 2017-11-16 (×4): 40 mg via INTRAVENOUS
  Filled 2017-11-12 (×4): qty 1

## 2017-11-12 MED ORDER — CALCITRIOL 0.25 MCG PO CAPS
0.2500 ug | ORAL_CAPSULE | ORAL | Status: DC
  Administered 2017-11-12 – 2017-11-16 (×3): 0.25 ug via ORAL
  Filled 2017-11-12 (×3): qty 1

## 2017-11-12 MED ORDER — MAGNESIUM SULFATE 2 GM/50ML IV SOLN
2.0000 g | Freq: Once | INTRAVENOUS | Status: AC
  Administered 2017-11-12: 2 g via INTRAVENOUS
  Filled 2017-11-12: qty 50

## 2017-11-12 MED ORDER — MOMETASONE FURO-FORMOTEROL FUM 200-5 MCG/ACT IN AERO
2.0000 | INHALATION_SPRAY | Freq: Two times a day (BID) | RESPIRATORY_TRACT | Status: DC
  Administered 2017-11-12 – 2017-11-13 (×2): 2 via RESPIRATORY_TRACT
  Filled 2017-11-12: qty 8.8

## 2017-11-12 MED ORDER — ALBUTEROL SULFATE (2.5 MG/3ML) 0.083% IN NEBU: 5.0000 mg | INHALATION_SOLUTION | Freq: Once | RESPIRATORY_TRACT | Status: DC

## 2017-11-12 MED ORDER — ALBUTEROL SULFATE (2.5 MG/3ML) 0.083% IN NEBU: 2.5000 mg | INHALATION_SOLUTION | RESPIRATORY_TRACT | Status: DC | PRN

## 2017-11-12 MED ORDER — POTASSIUM CHLORIDE CRYS ER 20 MEQ PO TBCR
40.0000 meq | EXTENDED_RELEASE_TABLET | Freq: Two times a day (BID) | ORAL | Status: AC
  Administered 2017-11-12 (×2): 40 meq via ORAL
  Filled 2017-11-12 (×2): qty 2

## 2017-11-12 MED ORDER — HYDROCERIN EX CREA
TOPICAL_CREAM | Freq: Two times a day (BID) | CUTANEOUS | Status: DC
  Administered 2017-11-12 – 2017-11-13 (×2): via TOPICAL
  Administered 2017-11-13: 1 via TOPICAL
  Administered 2017-11-14 – 2017-11-17 (×5): via TOPICAL
  Filled 2017-11-12: qty 113

## 2017-11-12 MED ORDER — METHYLPREDNISOLONE SODIUM SUCC 125 MG IJ SOLR
125.0000 mg | Freq: Once | INTRAMUSCULAR | Status: AC
  Administered 2017-11-12: 125 mg via INTRAVENOUS
  Filled 2017-11-12: qty 2

## 2017-11-12 MED ORDER — ISOSORBIDE MONONITRATE ER 30 MG PO TB24
30.0000 mg | ORAL_TABLET | Freq: Every day | ORAL | Status: DC
  Administered 2017-11-12 – 2017-11-17 (×6): 30 mg via ORAL
  Filled 2017-11-12 (×6): qty 1

## 2017-11-12 MED ORDER — TIOTROPIUM BROMIDE MONOHYDRATE 18 MCG IN CAPS
18.0000 ug | ORAL_CAPSULE | Freq: Every day | RESPIRATORY_TRACT | Status: DC
  Administered 2017-11-13 – 2017-11-14 (×2): 18 ug via RESPIRATORY_TRACT
  Filled 2017-11-12: qty 5

## 2017-11-12 MED ORDER — METOPROLOL SUCCINATE ER 25 MG PO TB24
12.5000 mg | ORAL_TABLET | Freq: Every day | ORAL | Status: DC
  Administered 2017-11-12 – 2017-11-17 (×6): 12.5 mg via ORAL
  Filled 2017-11-12 (×6): qty 1

## 2017-11-12 MED ORDER — HYDROXYZINE HCL 25 MG PO TABS
25.0000 mg | ORAL_TABLET | Freq: Four times a day (QID) | ORAL | Status: DC | PRN
  Administered 2017-11-14 – 2017-11-15 (×2): 25 mg via ORAL
  Filled 2017-11-12 (×2): qty 1

## 2017-11-12 MED ORDER — LEVOFLOXACIN IN D5W 750 MG/150ML IV SOLN
750.0000 mg | Freq: Once | INTRAVENOUS | Status: AC
  Administered 2017-11-12: 750 mg via INTRAVENOUS
  Filled 2017-11-12: qty 150

## 2017-11-12 MED ORDER — POTASSIUM CHLORIDE CRYS ER 10 MEQ PO TBCR
10.0000 meq | EXTENDED_RELEASE_TABLET | Freq: Every day | ORAL | Status: DC
  Administered 2017-11-13: 10 meq via ORAL
  Filled 2017-11-12: qty 1

## 2017-11-12 MED ORDER — ALBUTEROL SULFATE (2.5 MG/3ML) 0.083% IN NEBU
2.5000 mg | INHALATION_SOLUTION | Freq: Three times a day (TID) | RESPIRATORY_TRACT | Status: DC
  Administered 2017-11-13: 2.5 mg via RESPIRATORY_TRACT
  Filled 2017-11-12: qty 3

## 2017-11-12 MED ORDER — ALBUTEROL (5 MG/ML) CONTINUOUS INHALATION SOLN
10.0000 mg/h | INHALATION_SOLUTION | RESPIRATORY_TRACT | Status: DC
Start: 2017-11-12 — End: 2017-11-12
  Administered 2017-11-12: 10 mg/h via RESPIRATORY_TRACT
  Filled 2017-11-12: qty 20

## 2017-11-12 MED ORDER — ALBUTEROL SULFATE (2.5 MG/3ML) 0.083% IN NEBU
2.5000 mg | INHALATION_SOLUTION | Freq: Four times a day (QID) | RESPIRATORY_TRACT | Status: DC
  Administered 2017-11-12 (×2): 2.5 mg via RESPIRATORY_TRACT
  Filled 2017-11-12 (×2): qty 3

## 2017-11-12 NOTE — ED Notes (Signed)
Bed: ZO10 Expected date:  Expected time:  Means of arrival:  Comments: 84 yo m sob

## 2017-11-12 NOTE — Progress Notes (Signed)
Pt arrived from ED on stretcher, slid to bed w/ 4 assist. VSS. Pt A&Ox4. Denies pain but does have conversational dyspnea. O2 sat stable on RA. Pt noted to have soft heels w/ blanchable erythema.  B/L foam heel protectors applied and heels elevated on pillows. Pt oriented to callbell and environment. POC discussed. Pt Mg noted to be 2.0 and 2gm mag sulfate IV ordered. This was clarified w/ Dr Maryfrances Bunnell who states he is aware of lab value and it is safe to administer med.

## 2017-11-12 NOTE — ED Notes (Signed)
Resp notified of patients continuous neb order.

## 2017-11-12 NOTE — H&P (Addendum)
History and Physical  Patient Name: Greg Jones     NWG:[id number]    DOB: 30-May-1928    DOA: 11/12/2017 PCP: Deeann Saint, MD  Patient coming from: [facility]  Chief Complaint: Dyspnea      HPI: Greg Jones is a 84 y.o. male with a past medical history significant for CHF EF 45%, COPD not on home O2, FEV1 34%, and CKD IV who presents with dyspnea for 2 days.  The patient was in the hospital for 7 days at the beginning of this month for COPD flare again, treated with steroids and doxycycline, and discharged with prolonged steroid taper.  Since then he been doing better, slowly returning to baseline, when in the last 2 days when he had started to notice he was more out of breath.  This morning he woke up at 5 AM with severe dyspnea and chills.  He was given nebulizer treatments and improved initially, but then had relapse, kept needing albuterol, and so he came to the ER.  Has some mild ankle swelling, symptoms worst when lying down, no PND.  His rash has also worsened again in the last day.  ED course: - Afebrile, heart rate 110, respirations 27, blood pressure 113/101, pulse ox 98% on room air -Sodium 143, potassium 3.0, creatinine 1.8 (baseline 2.0), WBC 11.7K, hemoglobin 14 -BNP 134 -Troponin negative -Chest x-ray showed some patchy right lower lobe opacities, consistent with edema or pneumonia -His ECG showed prolonged QT interval -He was given Levaquin and Solu-Medrol and TRH were asked to evaluate for COPD flare with pneumonia         ROS: Review of Systems  Constitutional: Positive for chills. Negative for diaphoresis, fever and malaise/fatigue.  Respiratory: Positive for shortness of breath and wheezing. Negative for sputum production.   Cardiovascular: Positive for leg swelling. Negative for chest pain, orthopnea and PND.  Skin: Positive for itching and rash.  All other systems reviewed and are negative.         Past Medical History:  Diagnosis Date    . CHF (congestive heart failure) (HCC)   . Chronic kidney disease   . COPD (chronic obstructive pulmonary disease) (HCC)   . GERD (gastroesophageal reflux disease)   . Hypertension   . Respiratory insufficiency     History reviewed. No pertinent surgical history.  Social History: Patient is from [place], moved here in last 2-3 years and lives with his sister.  Has been at Rehab at [facility] since last hospitalization.  The patient walks unassisted.  Nonsmoker.  Allergies  Allergen Reactions  . Other Other (See Comments)    Oxygen Sensitive  . Penicillins Rash and Other (See Comments)    Any -illins as well as Mycins Has patient had a PCN reaction causing immediate rash, facial/tongue/throat swelling, SOB or lightheadedness with hypotension: Yes Has patient had a PCN reaction causing severe rash involving mucus membranes or skin necrosis: Yes Has patient had a PCN reaction that required hospitalization No Has patient had a PCN reaction occurring within the last 10 years: No If all of the above answers are "NO", then may proceed with Cephalosporin use.   . Vitamin B12 Itching, Rash and Other (See Comments)    injections    Family history: family history includes Diabetes in his father; Tuberculosis in his mother.  Prior to Admission medications   Medication Sig Start Date End Date Taking? Authorizing Provider  albuterol (PROVENTIL HFA;VENTOLIN HFA) 108 (90 Base) MCG/ACT inhaler Inhale 2 puffs  into the lungs every 6 (six) hours as needed for wheezing or shortness of breath. 08/22/17  Yes Buriev, Isaiah Serge, MD  albuterol (PROVENTIL) (2.5 MG/3ML) 0.083% nebulizer solution Take 3 mLs (2.5 mg total) by nebulization every 6 (six) hours. DX:  J44.9 Patient taking differently: Take 2.5 mg by nebulization every 2 (two) hours as needed for wheezing. DX:  J44.9 03/23/17  Yes Oretha Milch, MD  amLODipine (NORVASC) 5 MG tablet Take 1 tablet (5 mg total) by mouth daily. 09/14/15  Yes Simonne Martinet, NP  aspirin EC 81 MG tablet Take 81 mg by mouth daily.   Yes [provider]  budesonide-formoterol (SYMBICORT) 160-4.5 MCG/ACT inhaler Inhale 2 puffs into the lungs 2 (two) times daily.   Yes [provider]  calcitRIOL (ROCALTROL) 0.25 MCG capsule Take 0.25 mcg by mouth every other day.   Yes [provider]  camphor-menthol Wynelle Fanny) lotion Apply 1 application topically as needed for itching.   Yes [provider]  fluconazole (DIFLUCAN) 100 MG tablet Take 100 mg by mouth daily. 11/07/17 11/20/17 Yes [provider]  furosemide (LASIX) 80 MG tablet Take 80 mg by mouth daily.   Yes [provider]  hydrOXYzine (ATARAX/VISTARIL) 25 MG tablet Take 25 mg by mouth every 6 (six) hours. Itching   Yes [provider]  insulin aspart (NOVOLOG) 100 UNIT/ML injection Inject 2-15 Units into the skin 3 (three) times daily before meals. SQ: 121-150= 2 units, 151-200= 3 units, 201-250= 5 units, 251-300= 8 units, 301-350= 11 units, 351-400=15 units, Greater than 400=15 units and Call MD   Yes [provider]  insulin glargine (LANTUS) 100 UNIT/ML injection Inject 14 Units into the skin at bedtime.   Yes [provider]  isosorbide mononitrate (IMDUR) 30 MG 24 hr tablet Take 30 mg by mouth daily.   Yes [provider]  omeprazole (PRILOSEC) 20 MG capsule Take 20 mg by mouth daily.   Yes [provider]  terbinafine (LAMISIL AT) 1 % cream Apply 1 application topically 2 (two) times daily. 10/12/17  Yes Deeann Saint, MD  tiotropium (SPIRIVA) 18 MCG inhalation capsule Place 18 mcg into inhaler and inhale daily.   Yes [provider]  triamcinolone cream (KENALOG) 0.1 % Apply 1 application topically 2 (two) times daily.   Yes [provider]       Physical Exam: BP (!) 154/82 (BP Location: Left Arm)   Pulse 100   Temp 98 F (36.7 C) (Oral)   Resp 18   Ht 5' 7.5" (1.715 m)   Wt 81.8 kg  (180 lb 5.4 oz)   SpO2 100%   BMI 27.83 kg/m  General appearance: Elderly overweight adult male, alert and in mild respiratory distress.   Eyes: Anicteric, conjunctiva pink, lids and lashes normal. PERRL.    ENT: No nasal deformity, discharge, epistaxis.  Hearing normal. OP moist without lesions.  Dentition normal. Neck: No neck masses.  Trachea midline.  No thyromegaly/tenderness. Lymph: No cervical or supraclavicular lymphadenopathy. Skin: Warm and dry.  There is a widespread erythematous maculopapular rash.  This is present worst on the groin folds, neck folds, other intertriginous areas, but also extends over the entire trunk where it is confluent in places, and out to the arms.  It appears to be pruritic.     Cardiac: RRR, nl S1-S2, no murmurs appreciated.  Capillary refill is brisk.  JVP not visible.  1+ ankle LE edema.  Radial pulses 2+ and symmetric.  Respiratory: Tachypneic, pursed lip breathing.  Wheezes bilaterally.  Dependent rales. Abdomen: Abdomen soft.  No TTP or guarding. No ascites, distension, hepatosplenomegaly.   MSK: No deformities or effusions of the large joints of the upper or lower extremities bilaterally.  No cyanosis or clubbing. Neuro: Cranial nerves normal.  Sensation intact to light touch. Speech is fluent.  Muscle strength 5/5 in upper and lower extremities bilaterally.    Psych: Sensorium intact and responding to questions, attention normal.  Behavior appropriate.  Affect normal.  Judgment and insight appear normal.     Labs on Admission:  I have personally reviewed following labs and imaging studies: CBC: Recent Labs  Lab 11/12/17 1029  WBC 11.7*  HGB 14.0  HCT 43.5  MCV 85.3  PLT 285   Basic Metabolic Panel: Recent Labs  Lab 11/12/17 1029  NA 143  K 3.0*  CL 99*  CO2 30  GLUCOSE 146*  BUN 33*  CREATININE 1.98*  CALCIUM 9.2  MG 2.0   GFR: Estimated Creatinine Clearance: 26.7 mL/min (A) (by C-G formula based on SCr of 1.98 mg/dL  (H)).  Liver Function Tests: Recent Labs  Lab 11/12/17 1029  AST 37  ALT 56  ALKPHOS 76  BILITOT 0.6  PROT 7.2  ALBUMIN 3.4*   No results for input(s): LIPASE, AMYLASE in the last 168 hours. No results for input(s): AMMONIA in the last 168 hours. Coagulation Profile: No results for input(s): INR, PROTIME in the last 168 hours. Cardiac Enzymes: No results for input(s): CKTOTAL, CKMB, CKMBINDEX, TROPONINI in the last 168 hours. BNP (last 3 results) No results for input(s): PROBNP in the last 8760 hours. HbA1C: No results for input(s): HGBA1C in the last 72 hours. CBG: No results for input(s): GLUCAP in the last 168 hours. Lipid Profile: No results for input(s): CHOL, HDL, LDLCALC, TRIG, CHOLHDL, LDLDIRECT in the last 72 hours. Thyroid Function Tests: No results for input(s): TSH, T4TOTAL, FREET4, T3FREE, THYROIDAB in the last 72 hours. Anemia Panel: No results for input(s): VITAMINB12, FOLATE, FERRITIN, TIBC, IRON, RETICCTPCT in the last 72 hours. Sepsis Labs: Invalid input(s): PROCALCITONIN, LACTICACIDVEN No results found for this or any previous visit (from the past 240 hour(s)).       Radiological Exams on Admission: Personally reviewed and shows patchy right lower lobe opacity: Dg Chest 2 View  Result Date: 11/12/2017 CLINICAL DATA:  Short of breath with exertion. EXAM: CHEST - 2 VIEW COMPARISON:  10/21/2017 FINDINGS: Cardiomegaly. Mild vascular congestion. No Kerley B lines. Mild patchy airspace disease in the right mid and lower lung zones. No pneumothorax or pleural effusion. IMPRESSION: Patchy airspace disease in the right mid and lower lung. Followup PA and lateral chest X-ray is recommended in 3-4 weeks following trial of antibiotic therapy to ensure resolution and exclude underlying malignancy. Cardiomegaly and vascular congestion without pulmonary edema. Electronically Signed   By: Jolaine Click M.D.   On: 11/12/2017 10:42    EKG: Independently reviewed.  Sinus  rhythm, no ST changes.  QT interval 540.    Assessment/Plan  1.  Acute on chronic hypoxic respiratory failure COPD exacerbation Resents with respiratory distress, hypoxia from his baseline.  Very wheezy.  Relieved with nebs. -Continue IV Solu-Medrol -Continue nebulized bronchodilators, scheduled and p.r.n. -Supplemental oxygen -Continue home Spiriva and Symbicort as Dulera  2.  Community-acquired pneumonia right lower lobe:  No evidence of sepsis.  Has allergy to penicillins, QT prolonged avoiding Levaquin.   -Doxycycline 100 IV BID -Check procalcitonin and broaden if necessary -Obtain  sputum cutlure  3.  Acute on chronic systolic CHF Hypertension:  This is if anything mild fluid overload, not decompensated HF.  BNP elevated, LE edema, cannot rule out edema on CXR. -Furosemide IV once then continue oral furosemide -Strict I/Os, daily weihgts -Close monitoring renal function -Obtain echocardiogram -ACE-ARB contraindicated in renal failure -Start metoprolol 12.5  -Continue Imdur -Hold home amlodipine -Continue aspirin  4.  Hypokalemia:  -Check mag -Supplement K  5.  Prolonged QT interval:  -Check mag -Supplement K -Repeat ECG tomorrow -Montior on tele -Avoid QT prolonging meds  6.  Rash:  This is odd.  It appears like a morbiliform drug rash.  Family report is has been waxing and waning in conjunction with his respiratory symptoms in the last few months.  It is not a fungal rash given how quickly it came on.  Per sister, SNF have said they will not transport him to a dermatology appointment. -Stop Diflucan -Topical symptomatic treatments as needed -Check LFTs, diff -Will need instructions to SNF in [place] to refer to dermatology  7.  Chronic kidney disease stage IV:  basleine 2.0-2.5.  Stable.  8. Diabetes -Continue Levemir -SSI with meals  9. Other medications -Continue PPI        DVT prophylaxis: Lovenox  Code Status: FULL  Family Communication:  Sister at bedside.  Overnight plan discussed, questions answered  Disposition Plan: Anticipate IV steroids and IV antibiotics for COPD flare and npeumonia.  Check procal/trend and sputum culture and broaden if needed.  Likely home in 4-5 days. Consults called: None Admission status: INPATIENT  At the time of admission, it appears that the appropriate admission status for this patient is INPATIENT. This is judged to be reasonable and necessary in order to provide the required intensity of service to ensure the patient's safety given the presenting symptoms, physical exam findings, and initial radiographic and laboratory data in the context of their chronic comorbidities.  Together, these circumstances are felt to place him at high risk for further clinical deterioration threatening life, limb, or organ.   Patient requires inpatient status due to high intensity of service, high risk for further deterioration and high frequency of surveillance required because of this severe exacerbation of their chronic organ failure.   Factors that support inpatient status include: presentation with respiratory distress, rate >25 breaths per minute.  New infiltrate on CXR.  COPD exacerbation in setting of severe COPD GOLD D (FEV1 34%), PSI 135 risk class V.  I certify that at the point of admission it is my clinical judgment that the patient will require inpatient hospital care spanning beyond 2 midnights from the point of admission and that early discharge would result in unnecessary risk of decompensation and readmission or threat to life, limb or bodily function.   Medical decision making: Patient seen at 3:40 PM on 11/12/2017.  The patient was discussed with Dr. Eudelia Bunch.  What exists of the patient's chart was reviewed in depth and summarized above.  Clinical condition: respiratory rate now stabilized on supplemental O2, mentation good.        Alberteen Sam [facility] Pager [phone]

## 2017-11-12 NOTE — ED Provider Notes (Signed)
[facility] [place] [facility] Provider Note  CSN: [id number] Arrival date & time: 11/12/17 0932  Chief Complaint(s) Shortness of Breath  HPI Greg Jones is a 84 y.o. male   The history is provided by the patient.  Shortness of Breath  This is a recurrent problem. The average episode lasts 4 days. The problem occurs continuously.The problem has been gradually worsening. Associated symptoms include cough, sputum production (white) and leg swelling. Pertinent negatives include no fever, no rhinorrhea, no orthopnea and no chest pain. Associated medical issues include COPD (no O2) and heart failure.    Past Medical History Past Medical History:  Diagnosis Date  . CHF (congestive heart failure) (HCC)   . Chronic kidney disease   . COPD (chronic obstructive pulmonary disease) (HCC)   . GERD (gastroesophageal reflux disease)   . Hypertension   . Respiratory insufficiency    Patient Active Problem List   Diagnosis Date Noted  . COPD with acute exacerbation (HCC) 10/18/2017  . Type 2 diabetes mellitus with other specified complication (HCC) 10/12/2017  . Pruritic rash 08/14/2017  . Pain and swelling of toe of left foot 08/14/2017  . Pressure injury of skin 04/12/2016  . CKD (chronic kidney disease), stage IV (HCC) 04/11/2016  . QT prolongation 04/11/2016  . Hypokalemia 04/11/2016  . Chronic combined systolic and diastolic CHF (congestive heart failure) (HCC) 01/11/2016  . GERD (gastroesophageal reflux disease) 05/28/2015  . COPD (chronic obstructive pulmonary disease) (HCC) 05/10/2015  . Essential hypertension 05/10/2015   Home Medication(s) Prior to Admission medications   Medication Sig Start Date End Date Taking? Authorizing Provider  albuterol (PROVENTIL HFA;VENTOLIN HFA) 108 (90 Base) MCG/ACT inhaler Inhale 2 puffs into the lungs every 6 (six) hours as needed for wheezing or shortness of breath. 08/22/17  Yes Buriev, Isaiah Serge, MD  albuterol  (PROVENTIL) (2.5 MG/3ML) 0.083% nebulizer solution Take 3 mLs (2.5 mg total) by nebulization every 6 (six) hours. DX:  J44.9 Patient taking differently: Take 2.5 mg by nebulization every 2 (two) hours as needed for wheezing. DX:  J44.9 03/23/17  Yes Oretha Milch, MD  amLODipine (NORVASC) 5 MG tablet Take 1 tablet (5 mg total) by mouth daily. 09/14/15  Yes Simonne Martinet, NP  aspirin EC 81 MG tablet Take 81 mg by mouth daily.   Yes [provider]  budesonide-formoterol (SYMBICORT) 160-4.5 MCG/ACT inhaler Inhale 2 puffs into the lungs 2 (two) times daily.   Yes [provider]  calcitRIOL (ROCALTROL) 0.25 MCG capsule Take 0.25 mcg by mouth every other day.   Yes [provider]  camphor-menthol Wynelle Fanny) lotion Apply 1 application topically as needed for itching.   Yes [provider]  fluconazole (DIFLUCAN) 100 MG tablet Take 100 mg by mouth daily. 11/07/17 11/20/17 Yes [provider]  furosemide (LASIX) 80 MG tablet Take 80 mg by mouth daily.   Yes [provider]  hydrOXYzine (ATARAX/VISTARIL) 25 MG tablet Take 25 mg by mouth every 6 (six) hours. Itching   Yes [provider]  insulin aspart (NOVOLOG) 100 UNIT/ML injection Inject 2-15 Units into the skin 3 (three) times daily before meals. SQ: 121-150= 2 units, 151-200= 3 units, 201-250= 5 units, 251-300= 8 units, 301-350= 11 units, 351-400=15 units, Greater than 400=15 units and Call MD   Yes [provider]  insulin glargine (LANTUS) 100 UNIT/ML injection Inject 14 Units into the skin at bedtime.   Yes [provider]  isosorbide mononitrate (IMDUR) 30 MG 24 hr tablet Take  30 mg by mouth daily.   Yes [provider]  omeprazole (PRILOSEC) 20 MG capsule Take 20 mg by mouth daily.   Yes [provider]  terbinafine (LAMISIL AT) 1 % cream Apply 1 application topically 2 (two) times daily. 10/12/17  Yes Deeann Saint, MD  tiotropium (SPIRIVA) 18 MCG  inhalation capsule Place 18 mcg into inhaler and inhale daily.   Yes [provider]  triamcinolone cream (KENALOG) 0.1 % Apply 1 application topically 2 (two) times daily.   Yes [provider]                                                                                                                                    Past Surgical History History reviewed. No pertinent surgical history. Family History Family History  Problem Relation Age of Onset  . Tuberculosis Mother   . Diabetes Father     Social History Social History   Tobacco Use  . Smoking status: Former Smoker    Packs/day: 0.50    Years: 70.00    Pack years: 35.00    Types: Cigarettes    Last attempt to quit: 04/19/2013    Years since quitting: 4.5  . Smokeless tobacco: Never Used  Substance Use Topics  . Alcohol use: No    Alcohol/week: 0.0 oz  . Drug use: No   Allergies Other; Penicillins; and Vitamin b12  Review of Systems Review of Systems  Constitutional: Negative for fever.  HENT: Negative for rhinorrhea.   Respiratory: Positive for cough, sputum production (white) and shortness of breath. Negative for chest tightness.   Cardiovascular: Positive for leg swelling. Negative for chest pain and orthopnea.   All other systems are reviewed and are negative for acute change except as noted in the HPI  Physical Exam Vital Signs  I have reviewed the triage vital signs BP (!) 113/101 (BP Location: Left Arm)   Pulse 83   Temp 98.4 F (36.9 C) (Oral)   Resp 20   SpO2 96%   Physical Exam  Constitutional: He is oriented to person, place, and time. He appears well-developed and well-nourished. No distress.  HENT:  Head: Normocephalic and atraumatic.  Nose: Nose normal.  Eyes: Pupils are equal, round, and reactive to light. Conjunctivae and EOM are normal. Right eye exhibits no discharge. Left eye exhibits no discharge. No scleral icterus.  Neck: Normal range of motion. Neck supple.    Cardiovascular: Normal rate and regular rhythm. Exam reveals no gallop and no friction rub.  No murmur heard. Pulmonary/Chest: Effort normal. No stridor. Tachypnea noted. No respiratory distress. He has wheezes (diffused exp wheezing.). He has no rales.  Breathing through pursed lip  Abdominal: Soft. He exhibits no distension. There is no tenderness.  Musculoskeletal: He exhibits no edema or tenderness.  Neurological: He is alert and oriented to person, place, and time.  Skin: Skin is warm and dry. No rash  noted. He is not diaphoretic. No erythema.  Psychiatric: He has a normal mood and affect.  Vitals reviewed.   ED Results and Treatments Labs (all labs ordered are listed, but only abnormal results are displayed) Labs Reviewed  CBC - Abnormal; Notable for the following components:      Result Value   WBC 11.7 (*)    RDW 17.4 (*)    All other components within normal limits  COMPREHENSIVE METABOLIC PANEL - Abnormal; Notable for the following components:   Potassium 3.0 (*)    Chloride 99 (*)    Glucose, Bld 146 (*)    BUN 33 (*)    Creatinine, Ser 1.98 (*)    Albumin 3.4 (*)    GFR calc non Af Amer 28 (*)    GFR calc Af Amer 33 (*)    All other components within normal limits  BRAIN NATRIURETIC PEPTIDE - Abnormal; Notable for the following components:   B Natriuretic Peptide 134.9 (*)    All other components within normal limits  CBC WITH DIFFERENTIAL/PLATELET - Abnormal; Notable for the following components:   WBC 11.2 (*)    RDW 17.8 (*)    All other components within normal limits  GLUCOSE, CAPILLARY - Abnormal; Notable for the following components:   Glucose-Capillary 270 (*)    All other components within normal limits  CULTURE, EXPECTORATED SPUTUM-ASSESSMENT  MRSA PCR SCREENING  CULTURE, RESPIRATORY (NON-EXPECTORATED)  MAGNESIUM  STREP PNEUMONIAE URINARY ANTIGEN  PROCALCITONIN  LEGIONELLA PNEUMOPHILA SEROGP 1 UR AG  HEPATIC FUNCTION PANEL  CBC  BASIC  METABOLIC PANEL  HEMOGLOBIN A1C  PROCALCITONIN  TSH  I-STAT TROPONIN, ED                                                                                                                         EKG  EKG Interpretation  Date/Time:  Monday Nov 12 2017 09:53:07 EDT Ventricular Rate:  102 PR Interval:    QRS Duration: 77 QT Interval:  423 QTC Calculation: 541 R Axis:   -9 Text Interpretation:  Sinus tachycardia Paired ventricular premature complexes Borderline low voltage, extremity leads Nonspecific T abnrm, anterolateral leads Otherwise no significant change Confirmed by Drema Pry [phone]) on 11/12/2017 11:14:45 AM      Radiology Dg Chest 2 View  Result Date: 11/12/2017 CLINICAL DATA:  Short of breath with exertion. EXAM: CHEST - 2 VIEW COMPARISON:  10/21/2017 FINDINGS: Cardiomegaly. Mild vascular congestion. No Kerley B lines. Mild patchy airspace disease in the right mid and lower lung zones. No pneumothorax or pleural effusion. IMPRESSION: Patchy airspace disease in the right mid and lower lung. Followup PA and lateral chest X-ray is recommended in 3-4 weeks following trial of antibiotic therapy to ensure resolution and exclude underlying malignancy. Cardiomegaly and vascular congestion without pulmonary edema. Electronically Signed   By: Jolaine Click M.D.   On: 11/12/2017 10:42   Pertinent labs & imaging results that were available during my care of the patient were  reviewed by me and considered in my medical decision making (see chart for details).  Medications Ordered in ED Medications  potassium chloride SA (K-DUR,KLOR-CON) CR tablet 40 mEq (40 mEq Oral Given 11/12/17 1554)  magnesium sulfate IVPB 2 g 50 mL (2 g Intravenous New Bag/Given 11/12/17 1618)  aspirin EC tablet 81 mg (has no administration in time range)  mometasone-formoterol (DULERA) 200-5 MCG/ACT inhaler 2 puff (has no administration in time range)  calcitRIOL (ROCALTROL) capsule 0.25 mcg (has no administration in  time range)  camphor-menthol (SARNA) lotion 1 application (has no administration in time range)  insulin glargine (LANTUS) injection 14 Units (has no administration in time range)  isosorbide mononitrate (IMDUR) 24 hr tablet 30 mg (has no administration in time range)  pantoprazole (PROTONIX) EC tablet 40 mg (has no administration in time range)  tiotropium (SPIRIVA) inhalation capsule 18 mcg (has no administration in time range)  doxycycline (VIBRA-TABS) tablet 100 mg (has no administration in time range)  insulin aspart (novoLOG) injection 0-20 Units (11 Units Subcutaneous Given 11/12/17 1702)  insulin aspart (novoLOG) injection 0-5 Units (has no administration in time range)  enoxaparin (LOVENOX) injection 30 mg (has no administration in time range)  metoprolol succinate (TOPROL-XL) 24 hr tablet 12.5 mg (12.5 mg Oral Given 11/12/17 1602)  potassium chloride (K-DUR,KLOR-CON) CR tablet 10 mEq (has no administration in time range)  furosemide (LASIX) tablet 80 mg (has no administration in time range)  albuterol (PROVENTIL) (2.5 MG/3ML) 0.083% nebulizer solution 2.5 mg (2.5 mg Nebulization Given 11/12/17 1619)  albuterol (PROVENTIL) (2.5 MG/3ML) 0.083% nebulizer solution 2.5 mg (has no administration in time range)  guaiFENesin (MUCINEX) 12 hr tablet 600 mg (has no administration in time range)  acetaminophen (TYLENOL) tablet 650 mg (has no administration in time range)    Or  acetaminophen (TYLENOL) suppository 650 mg (has no administration in time range)  methylPREDNISolone sodium succinate (SOLU-MEDROL) 40 mg/mL injection 40 mg (has no administration in time range)  hydrocerin (EUCERIN) cream (has no administration in time range)  hydrOXYzine (ATARAX/VISTARIL) tablet 25 mg (has no administration in time range)  ipratropium (ATROVENT) nebulizer solution 0.5 mg (0.5 mg Nebulization Given 11/12/17 1038)  methylPREDNISolone sodium succinate (SOLU-MEDROL) 125 mg/2 mL injection 125 mg (125 mg  Intravenous Given 11/12/17 1040)  levofloxacin (LEVAQUIN) IVPB 750 mg (0 mg Intravenous Stopped 11/12/17 1405)  furosemide (LASIX) injection 40 mg (40 mg Intravenous Given 11/12/17 1555)                                                                                                                                    Procedures Procedures CRITICAL CARE Performed by: Amadeo Garnet Daeron Carreno Total critical care time: 35 minutes Critical care time was exclusive of separately billable procedures and treating other patients. Critical care was necessary to treat or prevent imminent or life-threatening deterioration. Critical care was time spent personally by me on the following activities: development of treatment plan  with patient and/or surrogate as well as nursing, discussions with consultants, evaluation of patient's response to treatment, examination of patient, obtaining history from patient or surrogate, ordering and performing treatments and interventions, ordering and review of laboratory studies, ordering and review of radiographic studies, pulse oximetry and re-evaluation of patient's condition.   (including critical care time)  Medical Decision Making / ED Course I have reviewed the nursing notes for this encounter and the patient's prior records (if available in EHR or on provided paperwork).    Patient presents with worth sending dyspnea on exertion.  Exam notable for wheezing and mild rales.  Chest x-ray with evidence of pneumonia.  Given recent admission, patient will be covered for hospital-associated pneumonia with Levaquin.  Patient provided with continuous nebulizer and Solu-Medrol.  Patient endorsed improvement however while attempting to sit up patient became short of breath again requiring additional breathing treatments. In addition patient has evidence of mild volume overload with peripheral edema.  Chest x-ray did not reveal evidence of pulmonary edema.  Case was discussed with  medicine for admission and continued management.   Final Clinical Impression(s) / ED Diagnoses Final diagnoses:  SOB (shortness of breath)  HCAP (healthcare-associated pneumonia)  COPD exacerbation (HCC)      This chart was dictated using voice recognition software.  Despite best efforts to proofread,  errors can occur which can change the documentation meaning.   Nira Conn, MD 11/12/17 [phone]

## 2017-11-12 NOTE — ED Triage Notes (Signed)
Pt c/o shortness of breath with exertion, pt states he has been receiving neb treatments at [facility] and mild relief, pt received  of albuterol and 0.5 of Atrovent in EMS and pt arrived with mild sob and lung sounds decreased throughout mild inspiratory wheezing. Pt states sob is minimal while at rest. Pt also c/o rash all over body and is to be evaluated by derm in near future.

## 2017-11-12 NOTE — ED Notes (Signed)
Patient transported to X-ray 

## 2017-11-12 NOTE — ED Notes (Signed)
ED TO INPATIENT HANDOFF REPORT  Name/Age/Gender Greg Jones 84 y.o. male  Code Status Code Status History    Date Active Date Inactive Code Status Order ID Comments User Context   10/18/2017 2315 10/25/2017 2105 Full Code [id number]  Karmen Bongo, MD Inpatient   08/14/2017 2209 08/22/2017 1627 Full Code [id number]  Norval Morton, MD ED   09/07/2016 1820 09/11/2016 1721 Full Code [id number]  Dixie Dials, MD Inpatient   04/11/2016 1850 04/12/2016 1950 Full Code [id number]  Florencia Reasons, MD Inpatient   03/15/2016 1839 03/18/2016 1539 Full Code [id number]  Dixie Dials, MD Inpatient   09/07/2015 1554 09/14/2015 1712 Full Code [id number]  Parrett, Fonnie Mu, NP Inpatient      Home/SNF/Other Nursing Home  Chief Complaint wheezing  Level of Care/Admitting Diagnosis ED Disposition    ED Disposition Condition [facility] Area: [facility] [[id number]]  Level of Care: Telemetry [5]  Admit to tele based on following criteria: Monitor QTC interval  Diagnosis: COPD exacerbation [facility]) [[id number]]  Admitting Physician: Edwin Dada [[id number]]  Attending Physician: Edwin Dada [[id number]]  Estimated length of stay: past midnight tomorrow  Certification:: I certify this patient will need inpatient services for at least 2 midnights  PT Class (Do Not Modify): Inpatient [101]  PT Acc Code (Do Not Modify): Private [1]       Medical History Past Medical History:  Diagnosis Date  . CHF (congestive heart failure) ([place])   . Chronic kidney disease   . COPD (chronic obstructive pulmonary disease) ([place])   . GERD (gastroesophageal reflux disease)   . Hypertension   . Respiratory insufficiency     Allergies Allergies  Allergen Reactions  . Other Other (See Comments)    Oxygen Sensitive  . Penicillins Rash and Other (See Comments)    Any -illins as well as Mycins Has patient had a PCN reaction causing immediate rash, facial/tongue/throat swelling, SOB or  lightheadedness with hypotension: Yes Has patient had a PCN reaction causing severe rash involving mucus membranes or skin necrosis: Yes Has patient had a PCN reaction that required hospitalization No Has patient had a PCN reaction occurring within the last 10 years: No If all of the above answers are "NO", then may proceed with Cephalosporin use.   . Vitamin B12 Itching, Rash and Other (See Comments)    injections    IV Location/Drains/Wounds Patient Lines/Drains/Airways Status   Active Line/Drains/Airways    Name:   Placement date:   Placement time:   Site:   Days:   Peripheral IV 11/12/17 Left Antecubital   11/12/17    1033    Antecubital   less than 1          Labs/Imaging Results for orders placed or performed during the hospital encounter of 11/12/17 (from the past 48 hour(s))  CBC     Status: Abnormal   Collection Time: 11/12/17 10:29 AM  Result Value Ref Range   WBC 11.7 (H) 4.0 - 10.5 K/uL   RBC 5.10 4.22 - 5.81 MIL/uL   Hemoglobin 14.0 13.0 - 17.0 g/dL   HCT 43.5 39.0 - 52.0 %   MCV 85.3 78.0 - 100.0 fL   MCH 27.5 26.0 - 34.0 pg   MCHC 32.2 30.0 - 36.0 g/dL   RDW 17.4 (H) 11.5 - 15.5 %   Platelets 285 150 - 400 K/uL    Comment: Performed at [facility], [place] [address] [place], [place] [address]  Comprehensive  metabolic panel     Status: Abnormal   Collection Time: 11/12/17 10:29 AM  Result Value Ref Range   Sodium 143 135 - 145 mmol/L   Potassium 3.0 (L) 3.5 - 5.1 mmol/L   Chloride 99 (L) 101 - 111 mmol/L   CO2 30 22 - 32 mmol/L   Glucose, Bld 146 (H) 65 - 99 mg/dL   BUN 33 (H) 6 - 20 mg/dL   Creatinine, Ser 1.98 (H) 0.61 - 1.24 mg/dL   Calcium 9.2 8.9 - 10.3 mg/dL   Total Protein 7.2 6.5 - 8.1 g/dL   Albumin 3.4 (L) 3.5 - 5.0 g/dL   AST 37 15 - 41 U/L   ALT 56 17 - 63 U/L   Alkaline Phosphatase 76 38 - 126 U/L   Total Bilirubin 0.6 0.3 - 1.2 mg/dL   GFR calc non Af Amer 28 (L) >60 mL/min   GFR calc Af Amer 33 (L) >60 mL/min     Comment: (NOTE) The eGFR has been calculated using the CKD EPI equation. This calculation has not been validated in all clinical situations. eGFR's persistently <60 mL/min signify possible Chronic Kidney Disease.    Anion gap 14 5 - 15    Comment: Performed at [facility], [place] [address] [place], [place] [address]  Brain natriuretic peptide     Status: Abnormal   Collection Time: 11/12/17 10:29 AM  Result Value Ref Range   B Natriuretic Peptide 134.9 (H) 0.0 - 100.0 pg/mL    Comment: Performed at [facility], [place] [address], [place], [place] [address]  Magnesium     Status: None   Collection Time: 11/12/17 10:29 AM  Result Value Ref Range   Magnesium 2.0 1.7 - 2.4 mg/dL    Comment: Performed at [facility], [place] [address], [place], [place] [address]  I-Stat Troponin, ED (not at [facility])     Status: None   Collection Time: 11/12/17 10:36 AM  Result Value Ref Range   Troponin i, poc 0.05 0.00 - 0.08 ng/mL   Comment 3            Comment: Due to the release kinetics of cTnI, a negative result within the first hours of the onset of symptoms does not rule out myocardial infarction with certainty. If myocardial infarction is still suspected, repeat the test at appropriate intervals.    Dg Chest 2 View  Result Date: 11/12/2017 CLINICAL DATA:  Short of breath with exertion. EXAM: CHEST - 2 VIEW COMPARISON:  10/21/2017 FINDINGS: Cardiomegaly. Mild vascular congestion. No Kerley B lines. Mild patchy airspace disease in the right mid and lower lung zones. No pneumothorax or pleural effusion. IMPRESSION: Patchy airspace disease in the right mid and lower lung. Followup PA and lateral chest X-ray is recommended in 3-4 weeks following trial of antibiotic therapy to ensure resolution and exclude underlying malignancy. Cardiomegaly and vascular congestion without pulmonary edema. Electronically Signed   By: Marybelle Killings M.D.   On: 11/12/2017  10:42    Pending Labs Unresulted Labs (From admission, onward)   None      Vitals/Pain Today's Vitals   11/12/17 1032 11/12/17 1046 11/12/17 1200 11/12/17 1330  BP:  110/67 122/88 137/67  Pulse:  97 (!) 111 (!) 106  Resp:  17 (!) 27 17  Temp:      TempSrc:      SpO2: 98% (!) 53% 99% 94%  PainSc:        Isolation  Precautions No active isolations  Medications Medications  potassium chloride SA (K-DUR,KLOR-CON) CR tablet 40 mEq (has no administration in time range)  furosemide (LASIX) injection 40 mg (has no administration in time range)  magnesium sulfate IVPB 2 g 50 mL (has no administration in time range)  ipratropium (ATROVENT) nebulizer solution 0.5 mg (0.5 mg Nebulization Given 11/12/17 1038)  methylPREDNISolone sodium succinate (SOLU-MEDROL) 125 mg/2 mL injection 125 mg (125 mg Intravenous Given 11/12/17 1040)  levofloxacin (LEVAQUIN) IVPB 750 mg (0 mg Intravenous Stopped 11/12/17 1405)    Mobility walks with device

## 2017-11-12 NOTE — Care Management Note (Signed)
Case Management Note  Patient Details  Name: Greg Jones MRN: [id number] Date of Birth: December 14, 1927  Subjective/Objective: CM referral for HF home screen-3 adm/61months,EF 45-50%. PT cons-await recc.                   Action/Plan:d/c home w/HHC.   Expected Discharge Date:                  Expected Discharge Plan:  Home w Home Health Services  In-House Referral:     Discharge planning Services  CM Consult  Post Acute Care Choice:    Choice offered to:     DME Arranged:    DME Agency:     HH Arranged:    HH Agency:     Status of Service:  In process, will continue to follow  If discussed at Long Length of Stay Meetings, dates discussed:    Additional Comments:  Lanier Clam, RN 11/12/2017, 4:00 PM

## 2017-11-12 NOTE — Progress Notes (Signed)
Per nursing adm summary From [facility].CSW notified.

## 2017-11-13 ENCOUNTER — Inpatient Hospital Stay (HOSPITAL_COMMUNITY): Payer: Medicare Other

## 2017-11-13 DIAGNOSIS — I509 Heart failure, unspecified: Secondary | ICD-10-CM

## 2017-11-13 LAB — ECHOCARDIOGRAM COMPLETE
Height: 67.5 in
Weight: 2913.6 oz

## 2017-11-13 LAB — CBC
HCT: 36.3 % — ABNORMAL LOW (ref 39.0–52.0)
HEMOGLOBIN: 11.4 g/dL — AB (ref 13.0–17.0)
MCH: 26.2 pg (ref 26.0–34.0)
MCHC: 31.4 g/dL (ref 30.0–36.0)
MCV: 83.4 fL (ref 78.0–100.0)
Platelets: 268 10*3/uL (ref 150–400)
RBC: 4.35 MIL/uL (ref 4.22–5.81)
RDW: 17.6 % — AB (ref 11.5–15.5)
WBC: 12.3 10*3/uL — ABNORMAL HIGH (ref 4.0–10.5)

## 2017-11-13 LAB — BASIC METABOLIC PANEL
Anion gap: 13 (ref 5–15)
BUN: 35 mg/dL — ABNORMAL HIGH (ref 6–20)
CHLORIDE: 103 mmol/L (ref 101–111)
CO2: 25 mmol/L (ref 22–32)
CREATININE: 2.14 mg/dL — AB (ref 0.61–1.24)
Calcium: 8.9 mg/dL (ref 8.9–10.3)
GFR, EST AFRICAN AMERICAN: 30 mL/min — AB (ref >60)
GFR, EST NON AFRICAN AMERICAN: 26 mL/min — AB (ref >60)
Glucose, Bld: 156 mg/dL — ABNORMAL HIGH (ref 65–99)
Potassium: 4.9 mmol/L (ref 3.5–5.1)
SODIUM: 141 mmol/L (ref 135–145)

## 2017-11-13 LAB — MRSA PCR SCREENING: MRSA by PCR: POSITIVE — AB

## 2017-11-13 LAB — PROCALCITONIN: Procalcitonin: 0.49 ng/mL

## 2017-11-13 LAB — HEMOGLOBIN A1C
HEMOGLOBIN A1C: 8.1 % — AB (ref 4.8–5.6)
MEAN PLASMA GLUCOSE: 185.77 mg/dL

## 2017-11-13 LAB — GLUCOSE, CAPILLARY
Glucose-Capillary: 132 mg/dL — ABNORMAL HIGH (ref 65–99)
Glucose-Capillary: 118 mg/dL — ABNORMAL HIGH (ref 65–99)
Glucose-Capillary: 315 mg/dL — ABNORMAL HIGH (ref 65–99)
Glucose-Capillary: 219 mg/dL — ABNORMAL HIGH (ref 65–99)

## 2017-11-13 LAB — TSH: TSH: 0.689 u[IU]/mL (ref 0.350–4.500)

## 2017-11-13 LAB — LEGIONELLA PNEUMOPHILA SEROGP 1 UR AG: L. PNEUMOPHILA SEROGP 1 UR AG: NEGATIVE

## 2017-11-13 MED ORDER — ALBUTEROL SULFATE (2.5 MG/3ML) 0.083% IN NEBU: 2.5000 mg | INHALATION_SOLUTION | Freq: Two times a day (BID) | RESPIRATORY_TRACT | Status: DC

## 2017-11-13 MED ORDER — HYDROXYZINE HCL 25 MG PO TABS: 25.0000 mg | ORAL_TABLET | Freq: Four times a day (QID) | ORAL | Status: DC

## 2017-11-13 MED ORDER — HEPARIN SODIUM (PORCINE) 5000 UNIT/ML IJ SOLN
5000.0000 [IU] | Freq: Three times a day (TID) | INTRAMUSCULAR | Status: DC
  Administered 2017-11-13 – 2017-11-17 (×13): 5000 [IU] via SUBCUTANEOUS
  Filled 2017-11-13 (×13): qty 1

## 2017-11-13 MED ORDER — MUPIROCIN 2 % EX OINT
1.0000 "application " | TOPICAL_OINTMENT | Freq: Two times a day (BID) | CUTANEOUS | Status: DC
  Administered 2017-11-13 – 2017-11-17 (×9): 1 via NASAL
  Filled 2017-11-13 (×3): qty 22

## 2017-11-13 MED ORDER — CHLORHEXIDINE GLUCONATE CLOTH 2 % EX PADS
6.0000 | MEDICATED_PAD | Freq: Every day | CUTANEOUS | Status: AC
  Administered 2017-11-13 – 2017-11-17 (×5): 6 via TOPICAL

## 2017-11-13 MED ORDER — IPRATROPIUM-ALBUTEROL 0.5-2.5 (3) MG/3ML IN SOLN: 3.0000 mL | Freq: Four times a day (QID) | RESPIRATORY_TRACT | Status: DC | PRN

## 2017-11-13 MED ORDER — FUROSEMIDE 40 MG PO TABS
80.0000 mg | ORAL_TABLET | Freq: Every day | ORAL | Status: DC
  Administered 2017-11-14: 80 mg via ORAL
  Filled 2017-11-13: qty 2

## 2017-11-13 NOTE — Plan of Care (Signed)
  Problem: Health Behavior/Discharge Planning: Goal: Ability to manage health-related needs will improve Outcome: Progressing   Problem: Clinical Measurements: Goal: Diagnostic test results will improve Outcome: Progressing Goal: Respiratory complications will improve Outcome: Progressing Note:  Pt still with conversation dyspnea and SOB with minimal exertion. Maintains O2 sats on RA.  Goal: Cardiovascular complication will be avoided Outcome: Progressing   Problem: Activity: Goal: Risk for activity intolerance will decrease Outcome: Progressing   Problem: Elimination: Goal: Will not experience complications related to bowel motility Outcome: Progressing   Problem: Pain Managment: Goal: General experience of comfort will improve Outcome: Progressing   Problem: Skin Integrity: Goal: Risk for impaired skin integrity will decrease Outcome: Progressing   Problem: Education: Goal: Ability to demonstrate management of disease process will improve Outcome: Progressing Goal: Ability to verbalize understanding of medication therapies will improve Outcome: Progressing   Problem: Activity: Goal: Capacity to carry out activities will improve Outcome: Progressing   Problem: Cardiac: Goal: Ability to achieve and maintain adequate cardiopulmonary perfusion will improve Outcome: Progressing   Problem: Education: Goal: Knowledge of disease or condition will improve Outcome: Progressing Goal: Knowledge of the prescribed therapeutic regimen will improve Outcome: Progressing   Problem: Activity: Goal: Ability to tolerate increased activity will improve Outcome: Progressing Goal: Will verbalize the importance of balancing activity with adequate rest periods Outcome: Progressing   Problem: Respiratory: Goal: Ability to maintain a clear airway will improve Outcome: Progressing Goal: Levels of oxygenation will improve Outcome: Progressing Goal: Ability to maintain adequate  ventilation will improve Outcome: Progressing

## 2017-11-13 NOTE — CV Procedure (Signed)
2D echo attempted, but RN in room and consult. Will try later

## 2017-11-13 NOTE — Progress Notes (Signed)
  Echocardiogram 2D Echocardiogram has been performed.  Janalyn Harder 11/13/2017, 10:55 AM

## 2017-11-13 NOTE — Evaluation (Signed)
Physical Therapy Evaluation Patient Details Name: Greg Jones MRN: [id number] DOB: 1928-03-25 Today's Date: 11/13/2017   History of Present Illness  Pt was admitted with Acute on chronic hypoxic respiratory failure due to COPD exacerbation, CHF, and CAP.  PMH:  CKD, CHF, HTN, DM  Clinical Impression  Pt admitted with above diagnosis. Pt currently with functional limitations due to the deficits listed below (see PT Problem List).  Pt will benefit from skilled PT to increase their independence and safety with mobility to allow discharge to the venue listed below.  Pt up in recliner on arrival.  Pt assisted with ambulating in hallway and presented with 3/4 dyspnea and required supplemental oxygen.  Pt from SNF for rehab after last admission and would recommend return to SNF due to decreased endurance, oxygen requirement and generalized weakness.  If pt d/c home, would recommend increased care as pt has dyspnea with little exertion.     Follow Up Recommendations SNF    Equipment Recommendations  None recommended by PT    Recommendations for Other Services       Precautions / Restrictions Precautions Precautions: Fall Precaution Comments: monitor sats Restrictions Weight Bearing Restrictions: No      Mobility  Bed Mobility               General bed mobility comments: pt up in recliner on arrival  Transfers Overall transfer level: Needs assistance Equipment used: Rolling walker (2 wheeled) Transfers: Sit to/from Stand Sit to Stand: Min guard         General transfer comment: verbal cues for safety  Ambulation/Gait Ambulation/Gait assistance: Min guard Ambulation Distance (Feet): 80 Feet(x2) Assistive device: Rolling walker (2 wheeled) Gait Pattern/deviations: Step-through pattern;Decreased stride length     General Gait Details: pt with dyspnea 3/4 after 80 feet, chair brought behind pt to sit down to rest, SpO2 86% on room air so applied 2L O2 [name], pt able to  ambulate back to room on 2L O2 [name] with SPO2 93% and reports improved dyspnea however still present, pt left on 1L O2 [name] and RN notified  Stairs            Wheelchair Mobility    Modified Rankin (Stroke Patients Only)       Balance                                             Pertinent Vitals/Pain Pain Assessment: No/denies pain    Home Living Family/patient expects to be discharged to:: Skilled nursing facility Living Arrangements: Other relatives Available Help at Discharge: Family(sister)   Home Access: Level entry     Home Layout: One level Home Equipment: Walker - 4 wheels;Shower seat Additional Comments: has BSC but doesn't use it. Has seat in shower    Prior Function Level of Independence: Independent with assistive device(s)         Comments: pt has been at SNF since previous admission     Hand Dominance        Extremity/Trunk Assessment        Lower Extremity Assessment Lower Extremity Assessment: Generalized weakness    Cervical / Trunk Assessment Cervical / Trunk Assessment: Normal  Communication   Communication: No difficulties  Cognition Arousal/Alertness: Awake/alert Behavior During Therapy: WFL for tasks assessed/performed Overall Cognitive Status: Within Functional Limits for tasks assessed  General Comments      Exercises     Assessment/Plan    PT Assessment Patient needs continued PT services  PT Problem List Decreased strength;Decreased mobility;Decreased activity tolerance;Decreased balance;Decreased safety awareness;Cardiopulmonary status limiting activity       PT Treatment Interventions DME instruction;Functional mobility training;Therapeutic activities;Therapeutic exercise;Patient/family education;Gait training    PT Goals (Current goals can be found in the Care Plan section)  Acute Rehab PT Goals PT Goal Formulation: With patient Time For  Goal Achievement: 11/27/17 Potential to Achieve Goals: Good    Frequency Min 3X/week   Barriers to discharge        Co-evaluation               AM-PAC PT "6 Clicks" Daily Activity  Outcome Measure Difficulty turning over in bed (including adjusting bedclothes, sheets and blankets)?: A Little Difficulty moving from lying on back to sitting on the side of the bed? : A Little Difficulty sitting down on and standing up from a chair with arms (e.g., wheelchair, bedside commode, etc,.)?: A Little Help needed moving to and from a bed to chair (including a wheelchair)?: A Little Help needed walking in hospital room?: A Little Help needed climbing 3-5 steps with a railing? : A Lot 6 Click Score: 17    End of Session Equipment Utilized During Treatment: Gait belt;Oxygen Activity Tolerance: Patient tolerated treatment well Patient left: in chair;with call bell/phone within reach;with chair alarm set Nurse Communication: Mobility status PT Visit Diagnosis: Difficulty in walking, not elsewhere classified (R26.2)    Time: 5621-3086 PT Time Calculation (min) (ACUTE ONLY): 13 min   Charges:   PT Evaluation $PT Eval Low Complexity: 1 Low     PT G CodesZenovia Jones, PT, DPT 11/13/2017 Pager: [phone]   Greg Jones E 11/13/2017, 1:24 PM

## 2017-11-13 NOTE — Progress Notes (Signed)
   11/13/17 1400  Clinical Encounter Type  Visited With Patient  Visit Type Initial;Psychological support;Spiritual support  Referral From Nurse  Consult/Referral To Chaplain  Spiritual Encounters  Spiritual Needs Prayer;Emotional;Other (Comment) (Spiritual Care Conversation/Support)  Stress Factors  Patient Stress Factors None identified   I visited with the patient per Spiritual Care consult. The patient was receptive to my visit and stated that he was feeling ok today. We spoke briefly, but the patient stated that he was tired. I asked if he would like Spiritual Care to follow back up with him. He said yes.    Please, contact Spiritual Care for further assistance.   Chaplain Clint Bolder M.Div., [facility]

## 2017-11-13 NOTE — Progress Notes (Addendum)
PROGRESS NOTE    Greg Jones  ZOX:[id number] DOB: 07-09-1927 DOA: 11/12/2017 PCP: Deeann Saint, MD   Brief Narrative: Patient is a 84 year old male with past medical history of congestive heart failure COPD not on home oxygen, FEV1 of 34%, stage IV CKD who presented to the emergencywith complaints of shortness of breath for 2 days.  He was recently admitted here for COPD exacerbation and was treated with steroids and antibiotics and was discharged on prolonged steroid taper.  Patient admitted for COPD exacerbation and possible pneumonia.  Assessment & Plan:   Active Problems:   Essential hypertension   Chronic combined systolic and diastolic CHF (congestive heart failure) (HCC)   CKD (chronic kidney disease), stage IV (HCC)   QT prolongation   Hypokalemia   Pruritic rash   Type 2 diabetes mellitus with other specified complication (HCC)   COPD with acute exacerbation (HCC)  Acute on chronic hypoxic respiratory failure/COPD exacerbation: Presented with respiratory distress, wheezing.  This morning his respiratory status is stable.  Continue steroids and bronchodilators and supplemental oxygen as needed.  Patient is not on oxygen at home.  Pneumonia: Chest x-ray showed patchy right lower lobe opacity consistent with edema versus pneumonia.Started on doxycycline we will continue.  His QT was prolonged on presentation so Levaquin avoided. Patient is afebrile.  He is not in any kind of respiratory distress this morning.  Chronic systolic CHF: Currently euvolemic.  BNP was elevated and he had lower extremity edema on presentation.  On Lasix at home.  Continue input and output.  Echocardiogram done here showed ejection fraction of 50 to 55%, grade 1 diastolic dysfunction.  Not on ACE or ARB at home.  Continue beta-blockers, Imdur.  Hypokalemia: Supplemented and corrected.  We will continue to monitor prolonged QT:  Prolonged QT: Presented with QT of 541 which has improved to 432 this  morning.  CKD stage IV: Baseline creatinine ranges from 2-2.5.  Currently kidney function is stable.  Diabetes mellitus:  DM2:Continue Levemir, sliding cell insulin.  Rash: Patient has macular/morbilliform rash around his trunk, upper extremities and thighs.  Rash has been waxing and waning.  He does not report of any change in his medications.  He was on Diflucan which has been stopped.  Patient needs  to follow-up with dermatology as an outpatient after discharge.  Supportive treatment as needed.Steroids should also help.    DVT prophylaxis: Heparin Code Status: Full Family Communication: None present at the bedside Disposition Plan: Back to SNF  after improvement in his respiratory symptoms.Likely in 1-2 days   Consultants: None  Procedures:None  Antimicrobials: Doxycycline  since 11/13/2017  Subjective: Patient seen and examined at bedside this morning.  His respiratory status is stable.  He is bothered about his rash and complains of itching.  Objective: Vitals:   11/13/17 0436 11/13/17 0500 11/13/17 0755 11/13/17 1309  BP: (!) 141/71   136/78  Pulse: 84   80  Resp: 18   18  Temp: 97.9 F (36.6 C)   98.6 F (37 C)  TempSrc: Oral   Oral  SpO2: 100%  94% 100%  Weight:  82.6 kg (182 lb 1.6 oz)    Height:        Intake/Output Summary (Last 24 hours) at 11/13/2017 1457 Last data filed at 11/13/2017 1329 Gross per 24 hour  Intake 1160 ml  Output 1175 ml  Net -15 ml   Filed Weights   11/12/17 1523 11/13/17 0500  Weight: 81.8 kg (180 lb 5.4 oz)  82.6 kg (182 lb 1.6 oz)    Examination:  General exam: Appears calm and comfortable ,Not in distress,average built HEENT:PERRL,Oral mucosa moist, Ear/Nose normal on gross exam Respiratory system: Bilateral mildly decreased air entry, mild expiratory wheezes Cardiovascular system: S1 & S2 heard, RRR. No JVD, murmurs, rubs, gallops or clicks. No pedal edema. Gastrointestinal system: Abdomen is nondistended, soft and nontender.  No organomegaly or masses felt. Normal bowel sounds heard. Central nervous system: Alert and oriented. No focal neurological deficits. Extremities: No edema, no clubbing ,no cyanosis, distal peripheral pulses palpable. Skin: Uniform macular rashes around the trunk ,upper extremities and upper thigh, lesions or ulcers,no icterus ,no pallor    Data Reviewed: I have personally reviewed following labs and imaging studies  CBC: Recent Labs  Lab 11/12/17 1029 11/12/17 1612 11/13/17 0515  WBC 11.7* 11.2* 12.3*  NEUTROABS  --  10.6*  --   HGB 14.0 13.4 11.4*  HCT 43.5 42.5 36.3*  MCV 85.3 84.5 83.4  PLT 285 276 268   Basic Metabolic Panel: Recent Labs  Lab 11/12/17 1029 11/13/17 0515  NA 143 141  K 3.0* 4.9  CL 99* 103  CO2 30 25  GLUCOSE 146* 156*  BUN 33* 35*  CREATININE 1.98* 2.14*  CALCIUM 9.2 8.9  MG 2.0  --    GFR: Estimated Creatinine Clearance: 24.8 mL/min (A) (by C-G formula based on SCr of 2.14 mg/dL (H)). Liver Function Tests: Recent Labs  Lab 11/12/17 1029 11/12/17 1612  AST 37 41  ALT 56 57  ALKPHOS 76 73  BILITOT 0.6 0.5  PROT 7.2 6.8  ALBUMIN 3.4* 3.2*   No results for input(s): LIPASE, AMYLASE in the last 168 hours. No results for input(s): AMMONIA in the last 168 hours. Coagulation Profile: No results for input(s): INR, PROTIME in the last 168 hours. Cardiac Enzymes: No results for input(s): CKTOTAL, CKMB, CKMBINDEX, TROPONINI in the last 168 hours. BNP (last 3 results) No results for input(s): PROBNP in the last 8760 hours. HbA1C: Recent Labs    11/13/17 0515  HGBA1C 8.1*   CBG: Recent Labs  Lab 11/12/17 1641 11/12/17 2202 11/13/17 0803 11/13/17 1204  GLUCAP 270* 205* 132* 118*   Lipid Profile: No results for input(s): CHOL, HDL, LDLCALC, TRIG, CHOLHDL, LDLDIRECT in the last 72 hours. Thyroid Function Tests: Recent Labs    11/13/17 0515  TSH 0.689   Anemia Panel: No results for input(s): VITAMINB12, FOLATE, FERRITIN, TIBC,  IRON, RETICCTPCT in the last 72 hours. Sepsis Labs: Recent Labs  Lab 11/12/17 1533 11/13/17 0515  PROCALCITON 0.32 0.49    Recent Results (from the past 240 hour(s))  Culture, sputum-assessment     Status: None   Collection Time: 11/12/17  4:05 PM  Result Value Ref Range Status   Specimen Description EXPECTORATED SPUTUM  Final   Special Requests NONE  Final   Sputum evaluation   Final    THIS SPECIMEN IS ACCEPTABLE FOR SPUTUM CULTURE Performed at [facility], [address], [place], [place] [address]    Report Status 11/12/2017 FINAL  Final  Culture, respiratory (NON-Expectorated)     Status: None (Preliminary result)   Collection Time: 11/12/17  4:05 PM  Result Value Ref Range Status   Specimen Description   Final    EXPECTORATED SPUTUM Performed at [facility], [address] [place], [place] [address]    Special Requests   Final    NONE Reflexed from [id number] Performed at [facility],  [address], [place], [place] [address]    Gram Stain   Final    MODERATE WBC PRESENT, PREDOMINANTLY PMN ABUNDANT GRAM POSITIVE RODS    Culture   Final    TOO YOUNG TO READ Performed at [facility], [address], [place], [place] [address]    Report Status PENDING  Incomplete  MRSA PCR Screening     Status: Abnormal   Collection Time: 11/12/17  4:17 PM  Result Value Ref Range Status   MRSA by PCR POSITIVE (A) NEGATIVE Final    Comment:        The GeneXpert MRSA Assay (FDA approved for NASAL specimens only), is one component of a comprehensive MRSA colonization surveillance program. It is not intended to diagnose MRSA infection nor to guide or monitor treatment for MRSA infections. RESULT CALLED TO, READ BACK BY AND VERIFIED WITH: D FLOYD RN 0000 11/13/17 A NAVARRO Performed at [facility], [address], [place], [place] [address]          Radiology Studies: Dg Chest 2  View  Result Date: 11/12/2017 CLINICAL DATA:  Short of breath with exertion. EXAM: CHEST - 2 VIEW COMPARISON:  10/21/2017 FINDINGS: Cardiomegaly. Mild vascular congestion. No Kerley B lines. Mild patchy airspace disease in the right mid and lower lung zones. No pneumothorax or pleural effusion. IMPRESSION: Patchy airspace disease in the right mid and lower lung. Followup PA and lateral chest X-ray is recommended in 3-4 weeks following trial of antibiotic therapy to ensure resolution and exclude underlying malignancy. Cardiomegaly and vascular congestion without pulmonary edema. Electronically Signed   By: Jolaine Click M.D.   On: 11/12/2017 10:42        Scheduled Meds: . albuterol  2.5 mg Nebulization BID  . aspirin EC  81 mg Oral Daily  . calcitRIOL  0.25 mcg Oral QODAY  . Chlorhexidine Gluconate Cloth  6 each Topical Q0600  . doxycycline  100 mg Oral Q12H  . enoxaparin (LOVENOX) injection  30 mg Subcutaneous Q24H  . guaiFENesin  600 mg Oral BID  . hydrocerin   Topical BID  . insulin aspart  0-20 Units Subcutaneous TID WC  . insulin aspart  0-5 Units Subcutaneous QHS  . insulin glargine  14 Units Subcutaneous QHS  . isosorbide mononitrate  30 mg Oral Daily  . methylPREDNISolone (SOLU-MEDROL) injection  40 mg Intravenous Daily  . metoprolol succinate  12.5 mg Oral Daily  . mometasone-formoterol  2 puff Inhalation BID  . mupirocin ointment  1 application Nasal BID  . pantoprazole  40 mg Oral Daily  . tiotropium  18 mcg Inhalation Daily   Continuous Infusions:   LOS: 1 day    Time spent: 35 mins.More than 50% of that time was spent in counseling and/or coordination of care.      Burnadette Pop, MD [facility] Pager [phone]  If 7PM-7AM, please contact night-coverage [email or web address] Password [facility] 11/13/2017, 2:57 PM

## 2017-11-14 ENCOUNTER — Inpatient Hospital Stay (HOSPITAL_COMMUNITY): Payer: Medicare Other

## 2017-11-14 DIAGNOSIS — E1169 Type 2 diabetes mellitus with other specified complication: Secondary | ICD-10-CM

## 2017-11-14 DIAGNOSIS — I10 Essential (primary) hypertension: Secondary | ICD-10-CM

## 2017-11-14 DIAGNOSIS — Z794 Long term (current) use of insulin: Secondary | ICD-10-CM

## 2017-11-14 DIAGNOSIS — I5042 Chronic combined systolic (congestive) and diastolic (congestive) heart failure: Secondary | ICD-10-CM

## 2017-11-14 DIAGNOSIS — L282 Other prurigo: Secondary | ICD-10-CM

## 2017-11-14 LAB — GLUCOSE, CAPILLARY
GLUCOSE-CAPILLARY: 94 mg/dL (ref 65–99)
GLUCOSE-CAPILLARY: 162 mg/dL — AB (ref 65–99)
Glucose-Capillary: 114 mg/dL — ABNORMAL HIGH (ref 65–99)
Glucose-Capillary: 234 mg/dL — ABNORMAL HIGH (ref 65–99)

## 2017-11-14 LAB — CBC
HEMATOCRIT: 36.2 % — AB (ref 39.0–52.0)
Hemoglobin: 11.3 g/dL — ABNORMAL LOW (ref 13.0–17.0)
MCH: 26.2 pg (ref 26.0–34.0)
MCHC: 31.2 g/dL (ref 30.0–36.0)
MCV: 84 fL (ref 78.0–100.0)
PLATELETS: 251 10*3/uL (ref 150–400)
RBC: 4.31 MIL/uL (ref 4.22–5.81)
RDW: 17.7 % — ABNORMAL HIGH (ref 11.5–15.5)
WBC: 14.2 10*3/uL — AB (ref 4.0–10.5)

## 2017-11-14 LAB — BASIC METABOLIC PANEL
Anion gap: 8 (ref 5–15)
BUN: 46 mg/dL — ABNORMAL HIGH (ref 6–20)
CO2: 26 mmol/L (ref 22–32)
Calcium: 9.1 mg/dL (ref 8.9–10.3)
Chloride: 101 mmol/L (ref 101–111)
Creatinine, Ser: 2.1 mg/dL — ABNORMAL HIGH (ref 0.61–1.24)
GFR, EST AFRICAN AMERICAN: 31 mL/min — AB (ref >60)
GFR, EST NON AFRICAN AMERICAN: 26 mL/min — AB (ref >60)
GLUCOSE: 135 mg/dL — AB (ref 65–99)
POTASSIUM: 4.5 mmol/L (ref 3.5–5.1)
Sodium: 135 mmol/L (ref 135–145)

## 2017-11-14 LAB — PROCALCITONIN: Procalcitonin: 0.41 ng/mL

## 2017-11-14 NOTE — Progress Notes (Signed)
PROGRESS NOTE  Greg Jones:[id number] DOB: 1927/09/12 DOA: 11/12/2017 PCP: Greg Saint, MD  HPI/Recap of past 24 hours: Greg Jones is a 84 year old male with medical history significant for CHF, COPD, stage IV CKD presented to the ER with complaints of shortness of breath for the past 2 days.  Patient was recently admitted for COPD exacerbation and was discharged on prolonged steroid taper.  Patient admitted for further management.  Today, patient reported feeling better, still bilateral wheezing noted, reported improving cough.  Denies any chest pain, fever/chills.  Assessment/Plan: Active Problems:   Essential hypertension   Chronic combined systolic and diastolic CHF (congestive heart failure) (HCC)   CKD (chronic kidney disease), stage IV (HCC)   QT prolongation   Hypokalemia   Pruritic rash   Type 2 diabetes mellitus with other specified complication (HCC)   COPD with acute exacerbation (HCC)  HCAP/Aspiration PNA Afebrile, with leukocytosis (on steroids) Procalcitonin 0.41 Strep pneumonia urine and Legionella negative Chest x-ray showed patchy airspace disease in the right mid and lower lung, vascular congestion CT chest done in 08/2017 showed possible aspiration pneumonitis, irregular nodule within the right lower lobe, subpleural left lower lobe nodule.  Follow-up chest CT in 6 to 8 weeks Repeat CT chest without contrast SLP evaluation Due to prolongation of QTC, continue doxycycline Monitor closely  Acute on chronic hypoxic respiratory failure/COPD exacerbation Continue steroids/duo nebs/supplemental oxygen as needed Management as above  CKD stage IV Stable Baseline creatinine ranges from 2-2.5 Daily BMP  Diabetes mellitus type 2 A1c 8.1 Continue SSI, Lantus at bedtime, adjust PRN  Chronic systolic& diastolic heart failure Stable EF of 50 to 55%, mild hypokinesis of the inferior lateral and basal inferior myocardium, grade 1 diastolic  dysfunction Continue p.o. Lasix 80 mg daily, imdur, metoprolol  Macular/morbilliform rash Ongoing over a month Pruritic rash noted around trunk, upper extremities and thighs, unknown etiology Follow-up with dermatology as an outpatient Supportive treatment as needed, systemic steroids       Code Status: Full  Family Communication: None at bedside  Disposition Plan: Home once breathing has improved   Consultants:  None  Procedures:  None  Antimicrobials: Doxycycline  DVT prophylaxis: Heparin   Objective: Vitals:   11/14/17 0619 11/14/17 0724 11/14/17 1021 11/14/17 1306  BP: (!) 159/86  (!) 141/77 (!) 148/73  Pulse: 65  72 77  Resp: Temp: 97.6 F (36.4 C)   (!) 97.4 F (36.3 C)  TempSrc: Oral   Oral  SpO2: 97% 94% 94% 97%  Weight:      Height:        Intake/Output Summary (Last 24 hours) at 11/14/2017 1534 Last data filed at 11/14/2017 1500 Gross per 24 hour  Intake 240 ml  Output 1050 ml  Net -810 ml   Filed Weights   11/12/17 1523 11/13/17 0500 11/14/17 0615  Weight: 81.8 kg (180 lb 5.4 oz) 82.6 kg (182 lb 1.6 oz) 82.6 kg (182 lb 1.6 oz)    Exam:   General: NAD  Cardiovascular: S1, S2 present  Respiratory: Diffuse expiratory wheezing noted bilaterally  Abdomen: Soft, nontender, nondistended, bowel sounds present  Musculoskeletal: No pedal edema bilaterally  Skin: Macular rash noted to trunk, upper extremities, upper thigh  Psychiatry: Normal mood   Data Reviewed: CBC: Recent Labs  Lab 11/12/17 1029 11/12/17 1612 11/13/17 0515 11/14/17 0530  WBC 11.7* 11.2* 12.3* 14.2*  NEUTROABS  --  10.6*  --   --   HGB 14.0 13.4  11.4* 11.3*  HCT 43.5 42.5 36.3* 36.2*  MCV 85.3 84.5 83.4 84.0  PLT 285 276 268 251   Basic Metabolic Panel: Recent Labs  Lab 11/12/17 11/12/17 1029 11/13/17 0515 11/14/17 0530  NA 143 143 141 135  K 3.5 3.0* 4.9 4.5  CL  --  99* 103 101  CO2  --  GLUCOSE  --  146* 156* 135*  BUN  32* 33* 35* 46*  CREATININE 1.9* 1.98* 2.14* 2.10*  CALCIUM  --  9.2 8.9 9.1  MG  --  2.0  --   --    GFR: Estimated Creatinine Clearance: 25.2 mL/min (A) (by C-G formula based on SCr of 2.1 mg/dL (H)). Liver Function Tests: Recent Labs  Lab 11/12/17 1029 11/12/17 1612  AST 37 41  ALT 56 57  ALKPHOS 76 73  BILITOT 0.6 0.5  PROT 7.2 6.8  ALBUMIN 3.4* 3.2*   No results for input(s): LIPASE, AMYLASE in the last 168 hours. No results for input(s): AMMONIA in the last 168 hours. Coagulation Profile: No results for input(s): INR, PROTIME in the last 168 hours. Cardiac Enzymes: No results for input(s): CKTOTAL, CKMB, CKMBINDEX, TROPONINI in the last 168 hours. BNP (last 3 results) No results for input(s): PROBNP in the last 8760 hours. HbA1C: Recent Labs    11/13/17 0515  HGBA1C 8.1*   CBG: Recent Labs  Lab 11/13/17 1204 11/13/17 1618 11/13/17 2015 11/14/17 0742 11/14/17 1147  GLUCAP 118* 315* 219* 94 114*   Lipid Profile: No results for input(s): CHOL, HDL, LDLCALC, TRIG, CHOLHDL, LDLDIRECT in the last 72 hours. Thyroid Function Tests: Recent Labs    11/13/17 0515  TSH 0.689   Anemia Panel: No results for input(s): VITAMINB12, FOLATE, FERRITIN, TIBC, IRON, RETICCTPCT in the last 72 hours. Urine analysis:    Component Value Date/Time   COLORURINE STRAW (A) 05/30/2017 1826   APPEARANCEUR CLEAR 05/30/2017 1826   LABSPEC 1.012 05/30/2017 1826   PHURINE 6.0 05/30/2017 1826   GLUCOSEU >=500 (A) 05/30/2017 1826   HGBUR NEGATIVE 05/30/2017 1826   BILIRUBINUR NEGATIVE 05/30/2017 1826   KETONESUR 5 (A) 05/30/2017 1826   PROTEINUR NEGATIVE 05/30/2017 1826   NITRITE NEGATIVE 05/30/2017 1826   LEUKOCYTESUR NEGATIVE 05/30/2017 1826   Sepsis Labs: (procalcitonin:4,lacticidven:4)  ) Recent Results (from the past 240 hour(s))  Culture, sputum-assessment     Status: None   Collection Time: 11/12/17  4:05 PM  Result Value Ref Range Status   Specimen  Description EXPECTORATED SPUTUM  Final   Special Requests NONE  Final   Sputum evaluation   Final    THIS SPECIMEN IS ACCEPTABLE FOR SPUTUM CULTURE Performed at [facility], [address], [place], [place] [address]    Report Status 11/12/2017 FINAL  Final  Culture, respiratory (NON-Expectorated)     Status: None (Preliminary result)   Collection Time: 11/12/17  4:05 PM  Result Value Ref Range Status   Specimen Description   Final    EXPECTORATED SPUTUM Performed at [facility], [address] [place], [place] [address]    Special Requests   Final    NONE Reflexed from [id number] Performed at [facility], [address] [place], [place] [address]    Gram Stain   Final    MODERATE WBC PRESENT, PREDOMINANTLY PMN ABUNDANT GRAM POSITIVE RODS    Culture   Final    CULTURE REINCUBATED FOR BETTER GROWTH Performed at [facility], [address] [place], [place] [address]    Report Status PENDING  Incomplete  MRSA PCR Screening     Status: Abnormal   Collection Time: 11/12/17  4:17 PM  Result Value Ref Range Status   MRSA by PCR POSITIVE (A) NEGATIVE Final    Comment:        The GeneXpert MRSA Assay (FDA approved for NASAL specimens only), is one component of a comprehensive MRSA colonization surveillance program. It is not intended to diagnose MRSA infection nor to guide or monitor treatment for MRSA infections. RESULT CALLED TO, READ BACK BY AND VERIFIED WITH: D FLOYD RN 0000 11/13/17 A NAVARRO Performed at [facility], [address] [place], [place] [address]       Studies: No results found.  Scheduled Meds: . aspirin EC  81 mg Oral Daily  . calcitRIOL  0.25 mcg Oral QODAY  . Chlorhexidine Gluconate Cloth  6 each Topical Q0600  . doxycycline  100 mg Oral Q12H  . furosemide  80 mg Oral Daily  . guaiFENesin  600 mg Oral BID  . heparin injection (subcutaneous)  5,000 Units  Subcutaneous Q8H  . hydrocerin   Topical BID  . insulin aspart  0-20 Units Subcutaneous TID WC  . insulin aspart  0-5 Units Subcutaneous QHS  . insulin glargine  14 Units Subcutaneous QHS  . isosorbide mononitrate  30 mg Oral Daily  . methylPREDNISolone (SOLU-MEDROL) injection  40 mg Intravenous Daily  . metoprolol succinate  12.5 mg Oral Daily  . mupirocin ointment  1 application Nasal BID  . pantoprazole  40 mg Oral Daily  . tiotropium  18 mcg Inhalation Daily    Continuous Infusions:   LOS: 2 days     Briant Cedar, MD [facility]  If 7PM-7AM, please contact night-coverage [email or web address] Password [facility] 11/14/2017, 3:34 PM

## 2017-11-14 NOTE — [place] FL2 (Signed)
[place] MEDICAID FL2 LEVEL OF CARE SCREENING TOOL     IDENTIFICATION  Patient Name: Greg Jones Birthdate: April 03, 1928 Sex: male Admission Date (Current Location): 11/12/2017  [facility] and [place] Number:  Producer, television/film/video and Address:  [facility],  [address] [place], [place] [address]      Provider Number: [id number]  Attending Physician Name and Address:  Briant Cedar, MD  Relative Name and Phone Number:  Apostolos Blagg, sister, [phone]    Current Level of Care: Hospital Recommended Level of Care: Skilled Nursing Facility Prior Approval Number:    Date Approved/Denied:   PASRR Number: [id number]  Discharge Plan: SNF    Current Diagnoses: Patient Active Problem List   Diagnosis Date Noted  . COPD with acute exacerbation (HCC) 10/18/2017  . Type 2 diabetes mellitus with other specified complication (HCC) 10/12/2017  . Pruritic rash 08/14/2017  . Pain and swelling of toe of left foot 08/14/2017  . Pressure injury of skin 04/12/2016  . CKD (chronic kidney disease), stage IV (HCC) 04/11/2016  . QT prolongation 04/11/2016  . Hypokalemia 04/11/2016  . Chronic combined systolic and diastolic CHF (congestive heart failure) (HCC) 01/11/2016  . GERD (gastroesophageal reflux disease) 05/28/2015  . COPD (chronic obstructive pulmonary disease) (HCC) 05/10/2015  . Essential hypertension 05/10/2015    Orientation RESPIRATION BLADDER Height & Weight     Self, Time, Situation, Place  O2 Continent Weight: 182 lb 1.6 oz (82.6 kg) Height:  5' 7.5" (171.5 cm)  BEHAVIORAL SYMPTOMS/MOOD NEUROLOGICAL BOWEL NUTRITION STATUS      Continent Diet  AMBULATORY STATUS COMMUNICATION OF NEEDS Skin     Verbally Normal                       Personal Care Assistance Level of Assistance  Bathing, Feeding, Dressing Bathing Assistance: Limited assistance Feeding assistance: Independent Dressing Assistance: Limited assistance     Functional  Limitations Info  Sight, Hearing, Speech Sight Info: Adequate Hearing Info: Adequate Speech Info: Adequate    SPECIAL CARE FACTORS FREQUENCY  PT (By licensed PT), OT (By licensed OT)     PT Frequency: 5x/week OT Frequency: 5x/week            Contractures Contractures Info: Not present    Additional Factors Info  Code Status, Allergies, Insulin Sliding Scale Code Status Info: Full code Allergies Info: Penicillins;Vitamin B12;   Insulin Sliding Scale Info: Lantus: Inject 14 Units into the skin at bedtime. Novolog: Inject 2-15 Units into the skin 3 (three) times daily before meals.        Current Medications (11/14/2017):  This is the current hospital active medication list Current Facility-Administered Medications  Medication Dose Route Frequency Provider Last Rate Last Dose  . acetaminophen (TYLENOL) tablet 650 mg  650 mg Oral Q6H PRN Danford, Earl Lites, MD       Or  . acetaminophen (TYLENOL) suppository 650 mg  650 mg Rectal Q6H PRN Danford, Earl Lites, MD      . aspirin EC tablet 81 mg  81 mg Oral Daily Danford, Earl Lites, MD   81 mg at 11/14/17 1022  . calcitRIOL (ROCALTROL) capsule 0.25 mcg  0.25 mcg Oral QODAY Danford, Earl Lites, MD   0.25 mcg at 11/14/17 1021  . camphor-menthol (SARNA) lotion 1 application  1 application Topical PRN Danford, Earl Lites, MD      . Chlorhexidine Gluconate Cloth 2 % PADS 6 each  6 each Topical Q0600 Danford, Christopher P,  MD   6 each at 11/14/17 1000  . doxycycline (VIBRA-TABS) tablet 100 mg  100 mg Oral Q12H Danford, Earl Lites, MD   100 mg at 11/14/17 1022  . furosemide (LASIX) tablet 80 mg  80 mg Oral Daily Burnadette Pop, MD   80 mg at 11/14/17 1022  . guaiFENesin (MUCINEX) 12 hr tablet 600 mg  600 mg Oral BID Alberteen Sam, MD   600 mg at 11/14/17 1022  . heparin injection 5,000 Units  5,000 Units Subcutaneous Q8H Burnadette Pop, MD   5,000 Units at 11/14/17 1610  . hydrocerin (EUCERIN) cream   Topical  BID Danford, Earl Lites, MD      . hydrOXYzine (ATARAX/VISTARIL) tablet 25 mg  25 mg Oral Q6H PRN Danford, Earl Lites, MD      . insulin aspart (novoLOG) injection 0-20 Units  0-20 Units Subcutaneous TID WC Alberteen Sam, MD   15 Units at 11/13/17 1639  . insulin aspart (novoLOG) injection 0-5 Units  0-5 Units Subcutaneous QHS Alberteen Sam, MD   2 Units at 11/13/17 2206  . insulin glargine (LANTUS) injection 14 Units  14 Units Subcutaneous QHS Alberteen Sam, MD   14 Units at 11/13/17 2206  . ipratropium-albuterol (DUONEB) 0.5-2.5 (3) MG/3ML nebulizer solution 3 mL  3 mL Nebulization Q6H PRN Adhikari, Amrit, MD      . isosorbide mononitrate (IMDUR) 24 hr tablet 30 mg  30 mg Oral Daily Danford, Earl Lites, MD   30 mg at 11/14/17 1022  . methylPREDNISolone sodium succinate (SOLU-MEDROL) 40 mg/mL injection 40 mg  40 mg Intravenous Daily Danford, Earl Lites, MD   40 mg at 11/14/17 1020  . metoprolol succinate (TOPROL-XL) 24 hr tablet 12.5 mg  12.5 mg Oral Daily Danford, Earl Lites, MD   12.5 mg at 11/14/17 1022  . mupirocin ointment (BACTROBAN) 2 % 1 application  1 application Nasal BID Alberteen Sam, MD   1 application at 11/14/17 1020  . pantoprazole (PROTONIX) EC tablet 40 mg  40 mg Oral Daily Danford, Earl Lites, MD   40 mg at 11/14/17 1022  . tiotropium (SPIRIVA) inhalation capsule 18 mcg  18 mcg Inhalation Daily Alberteen Sam, MD   18 mcg at 11/14/17 9604     Discharge Medications: Please see discharge summary for a list of discharge medications.  Relevant Imaging Results:  Relevant Lab Results:   Additional Information SSN  [id number]  Antionette Poles, LCSW

## 2017-11-14 NOTE — Clinical Social Work Note (Signed)
Clinical Social Work Assessment  Patient Details  Name: Greg Jones MRN: [id number] Date of Birth: [birth year]  Date of referral:  11/14/17               Reason for consult:  Discharge Planning, Facility Placement                Permission sought to share information with:  Facility Medical sales representative, Family Supports Permission granted to share information::  Yes, Verbal Permission Granted  Name::     Greg Jones  Agency::     Relationship::  Sister   Contact Information:  [phone]  Housing/Transportation Living arrangements for the past 2 months:  Single Family Home Source of Information:  Patient, Siblings Patient Interpreter Needed:  None Criminal Activity/Legal Involvement Pertinent to Current Situation/Hospitalization:  No - Comment as needed Significant Relationships:  Siblings Lives with:  Siblings Do you feel safe going back to the place where you live?  (PT recommending SNF) Need for family participation in patient care:  Yes (Comment)  Care giving concerns:  Patient admitted from [facility] where patient is receiving ST rehab. Patient reported that prior to admission to rehab he was mostly independent with ADLs. Patient reported that his SOB has been impacting his functional abilities. PT reassessed patient on admission and recommended SNF.   Social Worker assessment / plan:  CSW spoke with patient at bedside regarding PT recommendation for SNF and discharge planning. Patient reported that he is agreeable to return to [facility] to complete ST rehab. Patient reported that he would like to return home but he doesn't want burden his sister. Patient requested that CSW contact his sister to discuss discharge planning further.   CSW contacted patient's sister Greg Jones) and discussed patient's discharge planning. Patient's sister reported that she is agreeable to patient returning to [facility] to complete ST rehab.  CSW contacted [name] and spoke with admissions staff member Greg Jones, staff confirmed patient's ability to return when medically stable.  CSW will complete patient's FL2.  CSW will continue to follow and assist with discharge planning.  Employment status:  Retired Health and safety inspector:  Medicare PT Recommendations:  Skilled Nursing Facility Information / Referral to community resources:  Skilled Nursing Facility  Patient/Family's Response to care:  Patient/patient's sister appreciative of CSW assistance with discharge planning.   Patient/Family's Understanding of and Emotional Response to Diagnosis, Current Treatment, and Prognosis:  Patient presented calm and verbalized understanding of current treatment plan. Patient hopeful to complete rehab, noting " I would like to be where I can do something for myself". Patient verbalized plan to return to [facility] to complete ST rehab. Patient's sister involved in patient's care and agreeable to patient's discharge plan.  Emotional Assessment Appearance:  Appears stated age Attitude/Demeanor/Rapport:  Other(Cooperative) Affect (typically observed):  Appropriate, Accepting, Pleasant Orientation:  Oriented to Self, Oriented to Situation, Oriented to Place, Oriented to  Time Alcohol / Substance use:  Not Applicable Psych involvement (Current and /or in the community):  No (Comment)  Discharge Needs  Concerns to be addressed:  Care Coordination Readmission within the last 30 days:  Yes Current discharge risk:  Physical Impairment, Chronically ill Barriers to Discharge:  Continued Medical Work up   [employer], LCSW 11/14/2017, 11:58 AM

## 2017-11-15 LAB — BASIC METABOLIC PANEL
Anion gap: 10 (ref 5–15)
BUN: 59 mg/dL — AB (ref 6–20)
CALCIUM: 9 mg/dL (ref 8.9–10.3)
CO2: 26 mmol/L (ref 22–32)
Chloride: 100 mmol/L — ABNORMAL LOW (ref 101–111)
Creatinine, Ser: 2.4 mg/dL — ABNORMAL HIGH (ref 0.61–1.24)
GFR calc Af Amer: 26 mL/min — ABNORMAL LOW (ref >60)
GFR, EST NON AFRICAN AMERICAN: 23 mL/min — AB (ref >60)
Glucose, Bld: 159 mg/dL — ABNORMAL HIGH (ref 65–99)
Potassium: 4.5 mmol/L (ref 3.5–5.1)
SODIUM: 136 mmol/L (ref 135–145)

## 2017-11-15 LAB — CBC WITH DIFFERENTIAL/PLATELET
Basophils Absolute: 0 10*3/uL (ref 0.0–0.1)
Basophils Relative: 0 %
EOS ABS: 0 10*3/uL (ref 0.0–0.7)
EOS PCT: 0 %
HCT: 37.4 % — ABNORMAL LOW (ref 39.0–52.0)
Hemoglobin: 11.9 g/dL — ABNORMAL LOW (ref 13.0–17.0)
LYMPHS ABS: 1.1 10*3/uL (ref 0.7–4.0)
Lymphocytes Relative: 9 %
MCH: 26.7 pg (ref 26.0–34.0)
MCHC: 31.8 g/dL (ref 30.0–36.0)
MCV: 84 fL (ref 78.0–100.0)
MONO ABS: 1 10*3/uL (ref 0.1–1.0)
Monocytes Relative: 8 %
Neutro Abs: 9.8 10*3/uL — ABNORMAL HIGH (ref 1.7–7.7)
Neutrophils Relative %: 83 %
PLATELETS: 246 10*3/uL (ref 150–400)
RBC: 4.45 MIL/uL (ref 4.22–5.81)
RDW: 17.5 % — AB (ref 11.5–15.5)
WBC: 11.9 10*3/uL — AB (ref 4.0–10.5)

## 2017-11-15 LAB — CULTURE, RESPIRATORY W GRAM STAIN

## 2017-11-15 LAB — GLUCOSE, CAPILLARY
GLUCOSE-CAPILLARY: 232 mg/dL — AB (ref 65–99)
Glucose-Capillary: 115 mg/dL — ABNORMAL HIGH (ref 65–99)
Glucose-Capillary: 148 mg/dL — ABNORMAL HIGH (ref 65–99)

## 2017-11-15 LAB — CULTURE, RESPIRATORY

## 2017-11-15 NOTE — Progress Notes (Signed)
PROGRESS NOTE  Greg Jones ZOX:[id number] DOB: 07-Oct-1927 DOA: 11/12/2017 PCP: Deeann Saint, MD  HPI/Recap of past 24 hours: Greg Jones is a 84 year old male with medical history significant for CHF, COPD, stage IV CKD presented to the ER with complaints of shortness of breath for the past 2 days.  Patient was recently admitted for COPD exacerbation and was discharged on prolonged steroid taper.  Patient admitted for further management.  Today, patient still with bilateral exp wheezing, although improving. Denies any chest pain, worsening cough, fever/chills.  Assessment/Plan: Active Problems:   Essential hypertension   Chronic combined systolic and diastolic CHF (congestive heart failure) (HCC)   CKD (chronic kidney disease), stage IV (HCC)   QT prolongation   Hypokalemia   Pruritic rash   Type 2 diabetes mellitus with other specified complication (HCC)   COPD with acute exacerbation (HCC)  HCAP/Aspiration PNA Afebrile, with leukocytosis (on steroids) Procalcitonin 0.41 Strep pneumonia urine and Legionella negative Chest x-ray showed patchy airspace disease in the right mid and lower lung, vascular congestion CT chest done in 08/2017 showed possible aspiration pneumonitis, irregular nodule within the right lower lobe, subpleural left lower lobe nodule.  Follow-up chest CT in 6 to 8 weeks Repeat CT chest showed: New inflammatory changes in the right upper and lower lobes with a focal area of slight consolidation at the right lung base posteriorly superimposed on emphysema and accentuated chronic bronchitic changes SLP evaluation, rec MBS Due to prolongation of QTC, continue doxycycline Monitor closely  Acute on chronic hypoxic respiratory failure/COPD exacerbation Continue steroids/duo nebs/supplemental oxygen as needed Management as above  CKD stage IV Stable Baseline creatinine ranges from 2-2.5 Held lasix as Cr bumped Daily BMP  Diabetes mellitus type 2 A1c  8.1 Continue SSI, Lantus at bedtime, adjust PRN  Chronic systolic& diastolic heart failure Stable EF of 50 to 55%, mild hypokinesis of the inferior lateral and basal inferior myocardium, grade 1 diastolic dysfunction Held p.o. Lasix 80 mg daily due to increase in Cr, continue imdur, metoprolol  Macular/morbilliform rash Ongoing over a month Pruritic rash noted around trunk, upper extremities and thighs, unknown etiology Follow-up with dermatology as an outpatient Supportive treatment as needed, systemic steroids       Code Status: Full  Family Communication: None at bedside  Disposition Plan: Home once breathing has improved   Consultants:  None  Procedures:  None  Antimicrobials: Doxycycline  DVT prophylaxis: Heparin   Objective: Vitals:   11/14/17 1306 11/14/17 2045 11/15/17 0541 11/15/17 1357  BP: (!) 148/73 133/73 (!) 150/68 (!) 154/75  Pulse: 77 68 63 73  Resp: Temp: (!) 97.4 F (36.3 C) 97.8 F (36.6 C) 98.2 F (36.8 C)   TempSrc: Oral Oral Oral   SpO2: 97% 98% 100% 100%  Weight:   82.1 kg (181 lb)   Height:        Intake/Output Summary (Last 24 hours) at 11/15/2017 1928 Last data filed at 11/15/2017 1854 Gross per 24 hour  Intake 360 ml  Output 825 ml  Net -465 ml   Filed Weights   11/13/17 0500 11/14/17 0615 11/15/17 0541  Weight: 82.6 kg (182 lb 1.6 oz) 82.6 kg (182 lb 1.6 oz) 82.1 kg (181 lb)    Exam:   General: NAD  Cardiovascular: S1, S2 present  Respiratory: Diffuse expiratory wheezing noted bilaterally  Abdomen: Soft, nontender, nondistended, bowel sounds present  Musculoskeletal: No pedal edema bilaterally  Skin: Macular rash noted to trunk, upper extremities,  upper thigh  Psychiatry: Normal mood   Data Reviewed: CBC: Recent Labs  Lab 11/12/17 1029 11/12/17 1612 11/13/17 0515 11/14/17 0530 11/15/17 0547  WBC 11.7* 11.2* 12.3* 14.2* 11.9*  NEUTROABS  --  10.6*  --   --  9.8*  HGB 14.0 13.4 11.4*  11.3* 11.9*  HCT 43.5 42.5 36.3* 36.2* 37.4*  MCV 85.3 84.5 83.4 84.0 84.0  PLT 285 276 268 251 246   Basic Metabolic Panel: Recent Labs  Lab 11/12/17 11/12/17 1029 11/13/17 0515 11/14/17 0530 11/15/17 0547  NA 143 143 141 135 136  K 3.5 3.0* 4.9 4.5 4.5  CL  --  99* 103 101 100*  CO2  --  GLUCOSE  --  146* 156* 135* 159*  BUN 32* 33* 35* 46* 59*  CREATININE 1.9* 1.98* 2.14* 2.10* 2.40*  CALCIUM  --  9.2 8.9 9.1 9.0  MG  --  2.0  --   --   --    GFR: Estimated Creatinine Clearance: 22 mL/min (A) (by C-G formula based on SCr of 2.4 mg/dL (H)). Liver Function Tests: Recent Labs  Lab 11/12/17 1029 11/12/17 1612  AST 37 41  ALT 56 57  ALKPHOS 76 73  BILITOT 0.6 0.5  PROT 7.2 6.8  ALBUMIN 3.4* 3.2*   No results for input(s): LIPASE, AMYLASE in the last 168 hours. No results for input(s): AMMONIA in the last 168 hours. Coagulation Profile: No results for input(s): INR, PROTIME in the last 168 hours. Cardiac Enzymes: No results for input(s): CKTOTAL, CKMB, CKMBINDEX, TROPONINI in the last 168 hours. BNP (last 3 results) No results for input(s): PROBNP in the last 8760 hours. HbA1C: Recent Labs    11/13/17 0515  HGBA1C 8.1*   CBG: Recent Labs  Lab 11/14/17 1738 11/14/17 2041 11/15/17 0729 11/15/17 1142 11/15/17 1639  GLUCAP 234* 162* 115* 148* 232*   Lipid Profile: No results for input(s): CHOL, HDL, LDLCALC, TRIG, CHOLHDL, LDLDIRECT in the last 72 hours. Thyroid Function Tests: Recent Labs    11/13/17 0515  TSH 0.689   Anemia Panel: No results for input(s): VITAMINB12, FOLATE, FERRITIN, TIBC, IRON, RETICCTPCT in the last 72 hours. Urine analysis:    Component Value Date/Time   COLORURINE STRAW (A) 05/30/2017 1826   APPEARANCEUR CLEAR 05/30/2017 1826   LABSPEC 1.012 05/30/2017 1826   PHURINE 6.0 05/30/2017 1826   GLUCOSEU >=500 (A) 05/30/2017 1826   HGBUR NEGATIVE 05/30/2017 1826   BILIRUBINUR NEGATIVE 05/30/2017 1826   KETONESUR 5  (A) 05/30/2017 1826   PROTEINUR NEGATIVE 05/30/2017 1826   NITRITE NEGATIVE 05/30/2017 1826   LEUKOCYTESUR NEGATIVE 05/30/2017 1826   Sepsis Labs: (procalcitonin:4,lacticidven:4)  ) Recent Results (from the past 240 hour(s))  Culture, sputum-assessment     Status: None   Collection Time: 11/12/17  4:05 PM  Result Value Ref Range Status   Specimen Description EXPECTORATED SPUTUM  Final   Special Requests NONE  Final   Sputum evaluation   Final    THIS SPECIMEN IS ACCEPTABLE FOR SPUTUM CULTURE Performed at [facility], [address], [place], [place] [address]    Report Status 11/12/2017 FINAL  Final  Culture, respiratory (NON-Expectorated)     Status: None   Collection Time: 11/12/17  4:05 PM  Result Value Ref Range Status   Specimen Description   Final    EXPECTORATED SPUTUM Performed at [facility], [address], [place], [place] [address]    Special Requests  Final    NONE Reflexed from [id number] Performed at [facility], [address], [place], [place] [address]    Gram Stain   Final    MODERATE WBC PRESENT, PREDOMINANTLY PMN ABUNDANT GRAM POSITIVE RODS    Culture   Final    ABUNDANT CORYNEBACTERIUM STRIATUM Standardized susceptibility testing for this organism is not available. Performed at [facility], [address], [place], [place] [address]    Report Status 11/15/2017 FINAL  Final  MRSA PCR Screening     Status: Abnormal   Collection Time: 11/12/17  4:17 PM  Result Value Ref Range Status   MRSA by PCR POSITIVE (A) NEGATIVE Final    Comment:        The GeneXpert MRSA Assay (FDA approved for NASAL specimens only), is one component of a comprehensive MRSA colonization surveillance program. It is not intended to diagnose MRSA infection nor to guide or monitor treatment for MRSA infections. RESULT CALLED TO, READ BACK BY AND VERIFIED WITH: D FLOYD RN 0000 11/13/17 A  NAVARRO Performed at [facility], [address] [place], [place] [address]       Studies: No results found.  Scheduled Meds: . aspirin EC  81 mg Oral Daily  . calcitRIOL  0.25 mcg Oral QODAY  . Chlorhexidine Gluconate Cloth  6 each Topical Q0600  . doxycycline  100 mg Oral Q12H  . guaiFENesin  600 mg Oral BID  . heparin injection (subcutaneous)  5,000 Units Subcutaneous Q8H  . hydrocerin   Topical BID  . insulin aspart  0-20 Units Subcutaneous TID WC  . insulin aspart  0-5 Units Subcutaneous QHS  . insulin glargine  14 Units Subcutaneous QHS  . isosorbide mononitrate  30 mg Oral Daily  . methylPREDNISolone (SOLU-MEDROL) injection  40 mg Intravenous Daily  . metoprolol succinate  12.5 mg Oral Daily  . mupirocin ointment  1 application Nasal BID  . pantoprazole  40 mg Oral Daily  . tiotropium  18 mcg Inhalation Daily    Continuous Infusions:   LOS: 3 days     Briant Cedar, MD [facility]  If 7PM-7AM, please contact night-coverage [email or web address] Password [facility] 11/15/2017, 7:28 PM

## 2017-11-15 NOTE — Evaluation (Signed)
Clinical/Bedside Swallow Evaluation Patient Details  Name: Greg Jones MRN: [id number] Date of Birth: 01/09/1928  Today's Date: 11/15/2017 Time: SLP Start Time (ACUTE ONLY): 1430 SLP Stop Time (ACUTE ONLY): 1450 SLP Time Calculation (min) (ACUTE ONLY): 20 min  Past Medical History:  Past Medical History:  Diagnosis Date  . CHF (congestive heart failure) (HCC)   . Chronic kidney disease   . COPD (chronic obstructive pulmonary disease) (HCC)   . GERD (gastroesophageal reflux disease)   . Hypertension   . Respiratory insufficiency    Past Surgical History: History reviewed. No pertinent surgical history. HPI:  84 year old male admitted 11/12/17. 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 Chest CT = inflammatory change in RUL/RLL, consolidation RLL   Assessment / Plan / Recommendation Clinical Impression  RN and pt report no difficulty swallowing. Pt indicates PPI prevents regurgitation into hypopharynx. Pt presents with adequate oral motor strength and function. No overt s/s aspiration observed on thin liquid, puree, or solid trials, however, COPD increases risk for silent aspiration. Given history and current right lung congestion, will proceed with MBS to objectively assess oropharyngeal swallow and rule out silent aspiration. Pt/family, RN, and MD aware of results and recommendations. ST will schedule MBS with radiology for tomorrow.     SLP Visit Diagnosis: Dysphagia, unspecified (R13.10)    Aspiration Risk  Mild aspiration risk    Diet Recommendation Regular;Thin liquid   Liquid Administration via: Cup;Straw Medication Administration: Whole meds with liquid Supervision: Patient able to self feed Compensations: Minimize environmental distractions;Slow rate;Small sips/bites Postural Changes: Seated upright at 90 degrees;Remain upright for at least 30 minutes after po intake    Other  Recommendations Oral  Care Recommendations: Oral care BID   Follow up Recommendations (TBD)      Frequency and Duration   pending MBS results         Prognosis   fair-good     Swallow Study   General Date of Onset: 11/12/17 HPI: 84 year old male admitted 11/12/17. 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 Chest CT = inflammatory change in RUL/RLL, consolidation RLL Type of Study: Bedside Swallow Evaluation Previous Swallow Assessment: BSE 08/2017 - no overt s/s aspiration, reg/thin recommended. Documented history of significant esophageal issues. Diet Prior to this Study: Regular;Thin liquids Temperature Spikes Noted: No Respiratory Status: Nasal cannula History of Recent Intubation: No Behavior/Cognition: Alert;Cooperative;Pleasant mood Oral Cavity Assessment: Within Functional Limits Oral Care Completed by SLP: No Oral Cavity - Dentition: Adequate natural dentition Vision: Functional for self-feeding Self-Feeding Abilities: Able to feed self Patient Positioning: Upright in chair Baseline Vocal Quality: Normal Volitional Cough: Strong Volitional Swallow: Able to elicit    Oral/Motor/Sensory Function Overall Oral Motor/Sensory Function: Within functional limits   Ice Chips Ice chips: Not tested   Thin Liquid Thin Liquid: Within functional limits Presentation: Straw;Self Fed    Nectar Thick Nectar Thick Liquid: Not tested   Honey Thick Honey Thick Liquid: Not tested   Puree Puree: Within functional limits Presentation: Self Fed   Solid   GO   Solid: Within functional limits Presentation: Self Fed       Celia B. Murvin Natal, [facility], CCC-SLP Speech Language Pathologist [phone]  Leigh Aurora 11/15/2017,2:59 PM

## 2017-11-15 NOTE — Care Management Important Message (Signed)
Important Message  Patient Details  Name: Greg Jones MRN: [id number] Date of Birth: [birth year]   Medicare Important Message Given:  Yes    Caren Macadam 11/15/2017, 11:02 AMImportant Message  Patient Details  Name: Greg Jones MRN: [id number] Date of Birth: 22-May-1928   Medicare Important Message Given:  Yes    Caren Macadam 11/15/2017, 11:01 AM

## 2017-11-15 NOTE — Progress Notes (Signed)
Physical Therapy Treatment Patient Details Name: Greg Jones MRN: [id number] DOB: [birth year] Today's Date: 11/15/2017    History of Present Illness Pt was admitted with Acute on chronic hypoxic respiratory failure due to COPD exacerbation, CHF, and CAP.  PMH:  CKD, CHF, HTN, DM    PT Comments    Pt urgently requested using bathroom and had BM on the way to toilet.  Pt with dyspnea and SPO2 86% with just ambulating to bathroom.  Applied 2L O2 [place].  Pt assisted with pericare and changing socks.  Pt too fatigued to ambulate in hallway after using bathroom, so assisted to recliner.  Continue to recommend SNF upon d/c.   Follow Up Recommendations  SNF     Equipment Recommendations  None recommended by PT    Recommendations for Other Services       Precautions / Restrictions Precautions Precautions: Fall Precaution Comments: monitor sats    Mobility  Bed Mobility Overal bed mobility: Needs Assistance Bed Mobility: Supine to Sit     Supine to sit: Min guard     General bed mobility comments: effortful, utilized bed rail  Transfers Overall transfer level: Needs assistance Equipment used: Rolling walker (2 wheeled) Transfers: Sit to/from Stand Sit to Stand: Min assist         General transfer comment: initially min assist to stand without assistive device (pt urgently needed to use rest room and had BM on the way to bathroom), improved with RW from bathroom to recliner  Ambulation/Gait Ambulation/Gait assistance: Min guard Ambulation Distance (Feet): 18 Feet(total) Assistive device: Rolling walker (2 wheeled) Gait Pattern/deviations: Step-through pattern;Decreased stride length     General Gait Details: pt in a hurry to use bathroom, ambulated to bathroom without O2 and no RW, SpO2 86% upon sitting on commode so applied 2L O2 [place] and pt remained 93% remainder of session   Stairs             Wheelchair Mobility    Modified Rankin (Stroke Patients Only)        Balance                                            Cognition Arousal/Alertness: Awake/alert Behavior During Therapy: WFL for tasks assessed/performed Overall Cognitive Status: Within Functional Limits for tasks assessed                                        Exercises      General Comments        Pertinent Vitals/Pain Pain Assessment: No/denies pain    Home Living                      Prior Function            PT Goals (current goals can now be found in the care plan section) Progress towards PT goals: Progressing toward goals    Frequency    Min 3X/week      PT Plan Current plan remains appropriate    Co-evaluation              AM-PAC PT "6 Clicks" Daily Activity  Outcome Measure  Difficulty turning over in bed (including adjusting bedclothes, sheets and blankets)?: A Little Difficulty moving from lying on back to  sitting on the side of the bed? : A Little Difficulty sitting down on and standing up from a chair with arms (e.g., wheelchair, bedside commode, etc,.)?: A Little Help needed moving to and from a bed to chair (including a wheelchair)?: A Little Help needed walking in hospital room?: A Little Help needed climbing 3-5 steps with a railing? : A Lot 6 Click Score: 17    End of Session Equipment Utilized During Treatment: Gait belt;Oxygen Activity Tolerance: Patient limited by fatigue Patient left: in chair;with call bell/phone within reach;with chair alarm set Nurse Communication: Mobility status PT Visit Diagnosis: Difficulty in walking, not elsewhere classified (R26.2)     Time:[id number] PT Time Calculation (min) (ACUTE ONLY): 25 min  Charges:  $Gait Training: 8-22 mins                    G Codes:      Zenovia Jarred, PT, DPT 11/15/2017 Pager: [phone]  Maida Sale E 11/15/2017, 2:39 PM

## 2017-11-16 ENCOUNTER — Inpatient Hospital Stay (HOSPITAL_COMMUNITY): Payer: Medicare Other

## 2017-11-16 LAB — CBC WITH DIFFERENTIAL/PLATELET
BASOS ABS: 0.1 10*3/uL (ref 0.0–0.1)
Basophils Relative: 1 %
Eosinophils Absolute: 0 10*3/uL (ref 0.0–0.7)
Eosinophils Relative: 0 %
HEMATOCRIT: 37.7 % — AB (ref 39.0–52.0)
HEMOGLOBIN: 11.9 g/dL — AB (ref 13.0–17.0)
LYMPHS PCT: 15 %
Lymphs Abs: 1.8 10*3/uL (ref 0.7–4.0)
MCH: 26.6 pg (ref 26.0–34.0)
MCHC: 31.6 g/dL (ref 30.0–36.0)
MCV: 84.3 fL (ref 78.0–100.0)
Monocytes Absolute: 0.7 10*3/uL (ref 0.1–1.0)
Monocytes Relative: 5 %
NEUTROS ABS: 10 10*3/uL (ref 1.7–7.7)
NEUTROS PCT: 79 %
Platelets: 245 10*3/uL (ref 150–400)
RBC: 4.47 MIL/uL (ref 4.22–5.81)
RDW: 17.4 % — ABNORMAL HIGH (ref 11.5–15.5)
WBC: 12.6 10*3/uL — AB (ref 4.0–10.5)

## 2017-11-16 LAB — BASIC METABOLIC PANEL
ANION GAP: 7 (ref 5–15)
BUN: 57 mg/dL — ABNORMAL HIGH (ref 6–20)
CO2: 32 mmol/L (ref 22–32)
Calcium: 9 mg/dL (ref 8.9–10.3)
Chloride: 100 mmol/L — ABNORMAL LOW (ref 101–111)
Creatinine, Ser: 2.12 mg/dL — ABNORMAL HIGH (ref 0.61–1.24)
GFR calc Af Amer: 30 mL/min — ABNORMAL LOW (ref >60)
GFR calc non Af Amer: 26 mL/min — ABNORMAL LOW (ref >60)
GLUCOSE: 123 mg/dL — AB (ref 65–99)
POTASSIUM: 4.6 mmol/L (ref 3.5–5.1)
SODIUM: 139 mmol/L (ref 135–145)

## 2017-11-16 LAB — GLUCOSE, CAPILLARY
GLUCOSE-CAPILLARY: 150 mg/dL — AB (ref 65–99)
GLUCOSE-CAPILLARY: 257 mg/dL — AB (ref 65–99)
Glucose-Capillary: 91 mg/dL (ref 65–99)
Glucose-Capillary: 281 mg/dL — ABNORMAL HIGH (ref 65–99)

## 2017-11-16 MED ORDER — IPRATROPIUM-ALBUTEROL 0.5-2.5 (3) MG/3ML IN SOLN: 3.0000 mL | Freq: Three times a day (TID) | RESPIRATORY_TRACT | Status: DC

## 2017-11-16 MED ORDER — IPRATROPIUM-ALBUTEROL 0.5-2.5 (3) MG/3ML IN SOLN
3.0000 mL | Freq: Three times a day (TID) | RESPIRATORY_TRACT | Status: DC
Start: 2017-11-16 — End: 2017-11-17
  Administered 2017-11-16 – 2017-11-17 (×4): 3 mL via RESPIRATORY_TRACT
  Filled 2017-11-16 (×4): qty 3

## 2017-11-16 MED ORDER — IPRATROPIUM-ALBUTEROL 0.5-2.5 (3) MG/3ML IN SOLN
3.0000 mL | Freq: Four times a day (QID) | RESPIRATORY_TRACT | Status: DC
  Administered 2017-11-16: 3 mL via RESPIRATORY_TRACT

## 2017-11-16 MED ORDER — ALBUTEROL SULFATE (2.5 MG/3ML) 0.083% IN NEBU: 2.5000 mg | INHALATION_SOLUTION | RESPIRATORY_TRACT | Status: DC | PRN

## 2017-11-16 MED ORDER — IPRATROPIUM-ALBUTEROL 0.5-2.5 (3) MG/3ML IN SOLN: 3.0000 mL | Freq: Four times a day (QID) | RESPIRATORY_TRACT | Status: DC

## 2017-11-16 MED ORDER — MOMETASONE FURO-FORMOTEROL FUM 200-5 MCG/ACT IN AERO
2.0000 | INHALATION_SPRAY | Freq: Two times a day (BID) | RESPIRATORY_TRACT | Status: DC
  Administered 2017-11-16 – 2017-11-17 (×3): 2 via RESPIRATORY_TRACT
  Filled 2017-11-16: qty 8.8

## 2017-11-16 NOTE — Progress Notes (Signed)
MBS completed, full report to follow.  Pt with functional oropharyngeal swallow ability - No aspiration/penetration with all consistencies tested.  Pt with timely and strong swallow - he piecemeals consistently which is functional for him.  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  He did appear with esophageal backflow on MBS today WITHOUT sensation - advised him to said findings and recommended strict esophageal precautions. Recommend continue diet as tolerated.  Donavan Burnet, MS [facility] SLP [phone]

## 2017-11-16 NOTE — Progress Notes (Signed)
PROGRESS NOTE  Greg Jones ZOX:[id number] DOB: 02/10/1928 DOA: 11/12/2017 PCP: Deeann Saint, MD  HPI/Recap of past 24 hours: Greg Jones is a 84 year old male with medical history significant for CHF, COPD, stage IV CKD presented to the ER with complaints of shortness of breath for the past 2 days.  Patient was recently admitted for COPD exacerbation and was discharged on prolonged steroid taper.  Patient admitted for further management.  Today, patient still with bilateral exp wheezing, with some mild increased WOB. Denies any chest pain, worsening cough, fever/chills.  Assessment/Plan: Active Problems:   Essential hypertension   Chronic combined systolic and diastolic CHF (congestive heart failure) (HCC)   CKD (chronic kidney disease), stage IV (HCC)   QT prolongation   Hypokalemia   Pruritic rash   Type 2 diabetes mellitus with other specified complication (HCC)   COPD with acute exacerbation (HCC)  HCAP/Aspiration PNA Afebrile, with leukocytosis (on steroids) Procalcitonin 0.41 Strep pneumonia urine and Legionella negative Chest x-ray showed patchy airspace disease in the right mid and lower lung, vascular congestion CT chest done in 08/2017 showed possible aspiration pneumonitis, irregular nodule within the right lower lobe, subpleural left lower lobe nodule.  Follow-up chest CT in 6 to 8 weeks Repeat CT chest showed: New inflammatory changes in the right upper and lower lobes with a focal area of slight consolidation at the right lung base posteriorly superimposed on emphysema and accentuated chronic bronchitic changes SLP evaluation, rec MBS Due to prolongation of QTC, continue doxycycline Monitor closely  Acute on chronic hypoxic respiratory failure/COPD exacerbation Continue steroids/duo nebs/supplemental oxygen as needed Management as above  CKD stage IV Stable Baseline creatinine ranges from 2-2.5 Held lasix as Cr bumped Daily BMP  Diabetes mellitus  type 2 A1c 8.1 Continue SSI, Lantus at bedtime, adjust PRN  Chronic systolic& diastolic heart failure Stable EF of 50 to 55%, mild hypokinesis of the inferior lateral and basal inferior myocardium, grade 1 diastolic dysfunction Held p.o. Lasix 80 mg daily due to increase in Cr, continue imdur, metoprolol  Macular/morbilliform rash Ongoing over a month Pruritic rash noted around trunk, upper extremities and thighs, unknown etiology Follow-up with dermatology as an outpatient Supportive treatment as needed, systemic steroids       Code Status: Full  Family Communication: None at bedside  Disposition Plan: Home once breathing has improved   Consultants:  None  Procedures:  None  Antimicrobials: Doxycycline  DVT prophylaxis: Heparin   Objective: Vitals:   11/16/17 1224 11/16/17 1549 11/16/17 2008 11/16/17 2014  BP: 138/66   (!) 152/65  Pulse: 73   64  Resp: 17   18  Temp: 97.7 F (36.5 C)     TempSrc: Oral     SpO2: 93% 92% 93% 100%  Weight:      Height:        Intake/Output Summary (Last 24 hours) at 11/16/2017 2219 Last data filed at 11/16/2017 1700 Gross per 24 hour  Intake 240 ml  Output 300 ml  Net -60 ml   Filed Weights   11/14/17 0615 11/15/17 0541 11/16/17 0453  Weight: 82.6 kg (182 lb 1.6 oz) 82.1 kg (181 lb) 82.6 kg (182 lb 1.6 oz)    Exam:   General: NAD  Cardiovascular: S1, S2 present  Respiratory: Diminished breath sound bilaterally  Abdomen: Soft, nontender, nondistended, bowel sounds present  Musculoskeletal: No pedal edema bilaterally  Skin: Macular rash noted to trunk, upper extremities, upper thigh  Psychiatry: Normal mood   Data Reviewed:  CBC: Recent Labs  Lab 11/12/17 1612 11/13/17 0515 11/14/17 0530 11/15/17 0547 11/16/17 0553  WBC 11.2* 12.3* 14.2* 11.9* 12.6*  NEUTROABS 10.6*  --   --  9.8* 10.0  HGB 13.4 11.4* 11.3* 11.9* 11.9*  HCT 42.5 36.3* 36.2* 37.4* 37.7*  MCV 84.5 83.4 84.0 84.0 84.3  PLT 276  268 251 246 245   Basic Metabolic Panel: Recent Labs  Lab 11/12/17 1029 11/13/17 0515 11/14/17 0530 11/15/17 0547 11/16/17 0553  NA 143 141 135 136 139  K 3.0* 4.9 4.5 4.5 4.6  CL 99* 103 101 100* 100*  CO2 32  GLUCOSE 146* 156* 135* 159* 123*  BUN 33* 35* 46* 59* 57*  CREATININE 1.98* 2.14* 2.10* 2.40* 2.12*  CALCIUM 9.2 8.9 9.1 9.0 9.0  MG 2.0  --   --   --   --    GFR: Estimated Creatinine Clearance: 25 mL/min (A) (by C-G formula based on SCr of 2.12 mg/dL (H)). Liver Function Tests: Recent Labs  Lab 11/12/17 1029 11/12/17 1612  AST 37 41  ALT 56 57  ALKPHOS 76 73  BILITOT 0.6 0.5  PROT 7.2 6.8  ALBUMIN 3.4* 3.2*   No results for input(s): LIPASE, AMYLASE in the last 168 hours. No results for input(s): AMMONIA in the last 168 hours. Coagulation Profile: No results for input(s): INR, PROTIME in the last 168 hours. Cardiac Enzymes: No results for input(s): CKTOTAL, CKMB, CKMBINDEX, TROPONINI in the last 168 hours. BNP (last 3 results) No results for input(s): PROBNP in the last 8760 hours. HbA1C: No results for input(s): HGBA1C in the last 72 hours. CBG: Recent Labs  Lab 11/15/17 1639 11/16/17 0736 11/16/17 1152 11/16/17 1707 11/16/17 2012  GLUCAP 232* 91 150* 281* 257*   Lipid Profile: No results for input(s): CHOL, HDL, LDLCALC, TRIG, CHOLHDL, LDLDIRECT in the last 72 hours. Thyroid Function Tests: No results for input(s): TSH, T4TOTAL, FREET4, T3FREE, THYROIDAB in the last 72 hours. Anemia Panel: No results for input(s): VITAMINB12, FOLATE, FERRITIN, TIBC, IRON, RETICCTPCT in the last 72 hours. Urine analysis:    Component Value Date/Time   COLORURINE STRAW (A) 05/30/2017 1826   APPEARANCEUR CLEAR 05/30/2017 1826   LABSPEC 1.012 05/30/2017 1826   PHURINE 6.0 05/30/2017 1826   GLUCOSEU >=500 (A) 05/30/2017 1826   HGBUR NEGATIVE 05/30/2017 1826   BILIRUBINUR NEGATIVE 05/30/2017 1826   KETONESUR 5 (A) 05/30/2017 1826   PROTEINUR  NEGATIVE 05/30/2017 1826   NITRITE NEGATIVE 05/30/2017 1826   LEUKOCYTESUR NEGATIVE 05/30/2017 1826   Sepsis Labs: (procalcitonin:4,lacticidven:4)  ) Recent Results (from the past 240 hour(s))  Culture, sputum-assessment     Status: None   Collection Time: 11/12/17  4:05 PM  Result Value Ref Range Status   Specimen Description EXPECTORATED SPUTUM  Final   Special Requests NONE  Final   Sputum evaluation   Final    THIS SPECIMEN IS ACCEPTABLE FOR SPUTUM CULTURE Performed at [facility], [address], [place], [place] [address]    Report Status 11/12/2017 FINAL  Final  Culture, respiratory (NON-Expectorated)     Status: None   Collection Time: 11/12/17  4:05 PM  Result Value Ref Range Status   Specimen Description   Final    EXPECTORATED SPUTUM Performed at [facility], [address], [place], [place] [address]    Special Requests   Final    NONE Reflexed from [id number] Performed at [facility], [address]  [place], [place] [address]    Gram Stain   Final    MODERATE WBC PRESENT, PREDOMINANTLY PMN ABUNDANT GRAM POSITIVE RODS    Culture   Final    ABUNDANT CORYNEBACTERIUM STRIATUM Standardized susceptibility testing for this organism is not available. Performed at [facility], [address], [place], [place] [address]    Report Status 11/15/2017 FINAL  Final  MRSA PCR Screening     Status: Abnormal   Collection Time: 11/12/17  4:17 PM  Result Value Ref Range Status   MRSA by PCR POSITIVE (A) NEGATIVE Final    Comment:        The GeneXpert MRSA Assay (FDA approved for NASAL specimens only), is one component of a comprehensive MRSA colonization surveillance program. It is not intended to diagnose MRSA infection nor to guide or monitor treatment for MRSA infections. RESULT CALLED TO, READ BACK BY AND VERIFIED WITH: D FLOYD RN 0000 11/13/17 A NAVARRO Performed at [facility], [address] [place], [place] [address]       Studies: Dg Swallowing Func-speech Pathology  Result Date: 11/16/2017 Objective Swallowing Evaluation: Type of Study: MBS-Modified Barium Swallow Study  Patient Details Name: Greg Jones MRN: [id number] Date of Birth: 27-Nov-1927 Today's Date: 11/16/2017 Time: SLP Start Time (ACUTE ONLY): 0825 -SLP Stop Time (ACUTE ONLY): 0845 SLP Time Calculation (min) (ACUTE ONLY): 20 min Past Medical History: Past Medical History: Diagnosis Date . CHF (congestive heart failure) (HCC)  . Chronic kidney disease  . COPD (chronic obstructive pulmonary disease) (HCC)  . GERD (gastroesophageal reflux disease)  . Hypertension  . Respiratory insufficiency  Past Surgical History: No past surgical history on file. HPI: 84 year old male admitted 11/12/17. 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 Chest CT = inflammatory change in RUL/RLL, consolidation RLL  Subjective: Pt resting in recliner. RN reports pt tolerates regular/thin liquids. Got SOB going to bathroom today. Assessment / Plan / Recommendation CHL IP CLINICAL IMPRESSIONS 11/16/2017 Clinical Impression Pt with functional oropharyngeal swallow ability - No aspiration/penetration with all consistencies tested.  Pt with timely and strong swallow - he piecemeals consistently which is functional for him. 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  He did appear with esophageal backflow on MBS today WITHOUT sensation - advised him to said findings and recommended strict esophageal precautions.  SLP Visit Diagnosis Dysphagia, unspecified (R13.10) Attention and concentration deficit following -- Frontal lobe and executive function deficit following -- Impact on safety and function Mild aspiration risk   CHL IP TREATMENT RECOMMENDATION 11/15/2017 Treatment Recommendations Defer until completion of  intrumental exam   Prognosis 11/16/2017 Prognosis for Safe Diet Advancement Good Barriers to Reach Goals -- Barriers/Prognosis Comment -- CHL IP DIET RECOMMENDATION 11/16/2017 SLP Diet Recommendations Regular solids;Thin liquid Liquid Administration via Cup;Straw Medication Administration Whole meds with liquid Compensations Slow rate;Small sips/bites Postural Changes Remain semi-upright after after feeds/meals (Comment);Seated upright at 90 degrees   CHL IP OTHER RECOMMENDATIONS 11/16/2017 Recommended Consults -- Oral Care Recommendations Oral care BID Other Recommendations --   CHL IP FOLLOW UP RECOMMENDATIONS 11/16/2017 Follow up Recommendations None   CHL IP FREQUENCY AND DURATION 11/16/2017 Speech Therapy Frequency (ACUTE ONLY) min 1 x/week Treatment Duration 1 week      CHL IP ORAL PHASE 11/16/2017 Oral Phase WFL Oral - Pudding Teaspoon -- Oral - Pudding Cup -- Oral - Honey Teaspoon -- Oral - Honey Cup --  Oral - Nectar Teaspoon -- Oral - Nectar Cup [facility];Piecemeal swallowing Oral - Nectar Straw -- Oral - Thin Teaspoon -- Oral - Thin Cup WFL;Piecemeal swallowing Oral - Thin Straw Piecemeal swallowing;WFL Oral - Puree Piecemeal swallowing;WFL Oral - Mech Soft -- Oral - Regular Piecemeal swallowing;WFL Oral - Multi-Consistency -- Oral - Pill Piecemeal swallowing;WFL Oral Phase - Comment --  CHL IP PHARYNGEAL PHASE 11/16/2017 Pharyngeal Phase WFL Pharyngeal- Pudding Teaspoon -- Pharyngeal -- Pharyngeal- Pudding Cup -- Pharyngeal -- Pharyngeal- Honey Teaspoon -- Pharyngeal -- Pharyngeal- Honey Cup -- Pharyngeal -- Pharyngeal- Nectar Teaspoon -- Pharyngeal -- Pharyngeal- Nectar Cup WFL Pharyngeal -- Pharyngeal- Nectar Straw -- Pharyngeal -- Pharyngeal- Thin Teaspoon -- Pharyngeal -- Pharyngeal- Thin Cup WFL Pharyngeal -- Pharyngeal- Thin Straw WFL Pharyngeal -- Pharyngeal- Puree WFL Pharyngeal -- Pharyngeal- Mechanical Soft -- Pharyngeal -- Pharyngeal- Regular (No Data) Pharyngeal -- Pharyngeal- Multi-consistency --  Pharyngeal -- Pharyngeal- Pill WFL Pharyngeal -- Pharyngeal Comment --  CHL IP CERVICAL ESOPHAGEAL PHASE 11/16/2017 Cervical Esophageal Phase Impaired Pudding Teaspoon -- Pudding Cup -- Honey Teaspoon -- Honey Cup -- Nectar Teaspoon -- Nectar Cup -- Nectar Straw -- Thin Teaspoon -- Thin Cup -- Thin Straw -- Puree -- Mechanical Soft -- Regular -- Multi-consistency -- Pill -- Cervical Esophageal Comment upon esophageal sweep, pt appeared with retrograde propulsion of barium to proximal esophagus without awareness, barium tablet readily cleared Mills Koller, MS [facility] SLP [phone]               Scheduled Meds: . aspirin EC  81 mg Oral Daily  . calcitRIOL  0.25 mcg Oral QODAY  . Chlorhexidine Gluconate Cloth  6 each Topical Q0600  . doxycycline  100 mg Oral Q12H  . guaiFENesin  600 mg Oral BID  . heparin injection (subcutaneous)  5,000 Units Subcutaneous Q8H  . hydrocerin   Topical BID  . insulin aspart  0-20 Units Subcutaneous TID WC  . insulin aspart  0-5 Units Subcutaneous QHS  . insulin glargine  14 Units Subcutaneous QHS  . ipratropium-albuterol  3 mL Nebulization TID  . isosorbide mononitrate  30 mg Oral Daily  . methylPREDNISolone (SOLU-MEDROL) injection  40 mg Intravenous Daily  . metoprolol succinate  12.5 mg Oral Daily  . mometasone-formoterol  2 puff Inhalation BID  . mupirocin ointment  1 application Nasal BID  . pantoprazole  40 mg Oral Daily    Continuous Infusions:   LOS: 4 days     Briant Cedar, MD [facility]  If 7PM-7AM, please contact night-coverage [email or web address] Password [facility] 11/16/2017, 10:19 PM

## 2017-11-17 ENCOUNTER — Inpatient Hospital Stay (HOSPITAL_COMMUNITY): Payer: Medicare Other

## 2017-11-17 LAB — CBC WITH DIFFERENTIAL/PLATELET
BASOS ABS: 0 10*3/uL (ref 0.0–0.1)
Basophils Relative: 0 %
Eosinophils Absolute: 0.2 10*3/uL (ref 0.0–0.7)
Eosinophils Relative: 1 %
HCT: 37.5 % — ABNORMAL LOW (ref 39.0–52.0)
HEMOGLOBIN: 12 g/dL — AB (ref 13.0–17.0)
LYMPHS PCT: 12 %
Lymphs Abs: 1.9 10*3/uL (ref 0.7–4.0)
MCH: 26.6 pg (ref 26.0–34.0)
MCHC: 32 g/dL (ref 30.0–36.0)
MCV: 83.1 fL (ref 78.0–100.0)
Monocytes Absolute: 0.9 10*3/uL (ref 0.1–1.0)
Monocytes Relative: 6 %
Neutro Abs: 12.7 10*3/uL — ABNORMAL HIGH (ref 1.7–7.7)
Neutrophils Relative %: 81 %
Platelets: 266 10*3/uL (ref 150–400)
RBC: 4.51 MIL/uL (ref 4.22–5.81)
RDW: 17.5 % — AB (ref 11.5–15.5)
WBC: 15.7 10*3/uL — AB (ref 4.0–10.5)

## 2017-11-17 LAB — BASIC METABOLIC PANEL
ANION GAP: 10 (ref 5–15)
BUN: 48 mg/dL — ABNORMAL HIGH (ref 6–20)
CHLORIDE: 101 mmol/L (ref 101–111)
CO2: 28 mmol/L (ref 22–32)
Calcium: 9.4 mg/dL (ref 8.9–10.3)
Creatinine, Ser: 1.77 mg/dL — ABNORMAL HIGH (ref 0.61–1.24)
GFR calc Af Amer: 38 mL/min — ABNORMAL LOW (ref >60)
GFR, EST NON AFRICAN AMERICAN: 33 mL/min — AB (ref >60)
Glucose, Bld: 111 mg/dL — ABNORMAL HIGH (ref 65–99)
Potassium: 4.8 mmol/L (ref 3.5–5.1)
SODIUM: 139 mmol/L (ref 135–145)

## 2017-11-17 MED ORDER — DOXYCYCLINE HYCLATE 100 MG PO TABS: 100.0000 mg | ORAL_TABLET | Freq: Two times a day (BID) | ORAL | 0 refills | Status: AC

## 2017-11-17 MED ORDER — PREDNISONE 20 MG PO TABS
40.0000 mg | ORAL_TABLET | Freq: Every day | ORAL | Status: DC
  Administered 2017-11-17: 40 mg via ORAL
  Filled 2017-11-17: qty 2

## 2017-11-17 MED ORDER — PREDNISONE 10 MG PO TABS
ORAL_TABLET | ORAL | 0 refills | Status: DC
Start: 2017-11-17 — End: 2017-11-28

## 2017-11-17 MED ORDER — HYDROCERIN EX CREA: 1.0000 "application " | TOPICAL_CREAM | Freq: Two times a day (BID) | CUTANEOUS | 0 refills | Status: DC

## 2017-11-17 MED ORDER — AMLODIPINE BESYLATE 5 MG PO TABS
5.0000 mg | ORAL_TABLET | Freq: Every day | ORAL | Status: DC
  Administered 2017-11-17: 5 mg via ORAL
  Filled 2017-11-17: qty 1

## 2017-11-17 MED ORDER — HYDRALAZINE HCL 20 MG/ML IJ SOLN: 10.0000 mg | Freq: Three times a day (TID) | INTRAMUSCULAR | Status: DC | PRN

## 2017-11-17 NOTE — Progress Notes (Signed)
Report called to Duwayne Heckanielle at [facility]. [facility] here to transport. Sister notified of patients D/C.

## 2017-11-17 NOTE — Progress Notes (Signed)
SATURATION QUALIFICATIONS: (This note is used to comply with regulatory documentation for home oxygen)  Patient Saturations on Room Air at Rest = 97%  Patient Saturations on Room Air while Ambulating = 90%  

## 2017-11-17 NOTE — Progress Notes (Signed)
Clinical Social Worker facilitated patient discharge including contacting patient family and facility to confirm patient discharge plans.  Clinical information faxed to facility and family agreeable with plan.  CSW arranged ambulance transport via [facility] to [facility].  RN to call [phone] (pt will go in room 507) for report prior to discharge.  Clinical Social Worker will sign off for now as social work intervention is no longer needed. Please consult us again if new need arises.  Marrianne MoodAshley Daeja Helderman, MSW, [employer] [phone]

## 2017-11-17 NOTE — Discharge Summary (Signed)
Discharge Summary  Greg Jones ZOX:[id number] DOB: [birth year]  PCP: Deeann Saint, MD  Admit date: 11/12/2017 Discharge date: 11/17/2017  Time spent: 40 mins  Recommendations for Outpatient Follow-up:  1. PCP  Discharge Diagnoses:  Active Hospital Problems   Diagnosis Date Noted  . COPD with acute exacerbation (HCC) 10/18/2017  . Type 2 diabetes mellitus with other specified complication (HCC) 10/12/2017  . Pruritic rash 08/14/2017  . QT prolongation 04/11/2016  . Hypokalemia 04/11/2016  . CKD (chronic kidney disease), stage IV (HCC) 04/11/2016  . Chronic combined systolic and diastolic CHF (congestive heart failure) (HCC) 01/11/2016  . Essential hypertension 05/10/2015    Resolved Hospital Problems  No resolved problems to display.    Discharge Condition: Stable  Diet recommendation: Heart healthy  Vitals:   11/17/17 0818 11/17/17 1248  BP:  (!) 142/74  Pulse:  69  Resp:  18  Temp:  97.7 F (36.5 C)  SpO2: 93% 97%    History of present illness:  Greg Jones is a 84 year old male with medical history significant for CHF, COPD, stage IV CKD presented to the ER with complaints of shortness of breath for the past 2 days.  Patient was recently admitted for COPD exacerbation and was discharged on prolonged steroid taper.  Patient admitted for further management.  Today, patient reported significant improvement in breathing, no bilateral wheezing noted. Denies any chest pain, worsening cough, fever/chills. Pt stable for discharge with close follow up with PCP   Hospital Course:  Active Problems:   Essential hypertension   Chronic combined systolic and diastolic CHF (congestive heart failure) (HCC)   CKD (chronic kidney disease), stage IV (HCC)   QT prolongation   Hypokalemia   Pruritic rash   Type 2 diabetes mellitus with other specified complication (HCC)   COPD with acute exacerbation (HCC)  HCAP/Aspiration PNA Afebrile, with leukocytosis (on  steroids) Procalcitonin 0.41 Strep pneumonia urine and Legionella negative Chest x-ray showed patchy airspace disease in the right mid and lower lung, vascular congestion, repeat CXR showed improvement in pneumonia CT chest done in 08/2017 showed possible aspiration pneumonitis, irregular nodule within the right lower lobe, subpleural left lower lobe nodule.  Follow-up chest CT in 6 to 8 weeks Repeat CT chest showed: New inflammatory changes in the right upper and lower lobes with a focal area of slight consolidation at the right lung base posteriorly superimposed on emphysema and accentuated chronic bronchitic changes SLP evaluation, no further recommmendation Continue doxycycline for a total of 7 days (5 doses left starting tonight) Follow up with PCP  Acute on chronic hypoxic respiratory failure/COPD exacerbation Improved, not requiring O2 Continue tapered dose of steroids/nebs/inhalers  CKD stage IV Stable, better than baseline Baseline creatinine ranges from 2-2.5  Diabetes mellitus type 2 A1c 8.1 Continue SSI, Lantus at bedtime  Chronic systolic& diastolic heart failure Stable EF of 50 to 55%, mild hypokinesis of the inferior lateral and basal inferior myocardium, grade 1 diastolic dysfunction Continue Lasix 80 mg daily (consider decreasing dose per vol status), continue imdur  Macular/morbilliform rash Ongoing over a month Pruritic rash noted around trunk, upper extremities and thighs, unknown etiology Follow-up with dermatology as an outpatient Supportive treatment as needed, systemic steroids     Procedures:  None  Consultations:  None  Discharge Exam: BP (!) 142/74 (BP Location: Left Arm)   Pulse 69   Temp 97.7 F (36.5 C) (Oral)   Resp 18   Ht 5' 7.5" (1.715 m)   Wt 82.7 kg (182 lb  5.1 oz)   SpO2 97%   BMI 28.13 kg/m   General: NAD  Cardiovascular: S1, S2 present  Respiratory: Diminished BS bilaterally  Discharge Instructions You were cared  for by a hospitalist during your hospital stay. If you have any questions about your discharge medications or the care you received while you were in the hospital after you are discharged, you can call the unit and asked to speak with the hospitalist on call if the hospitalist that took care of you is not available. Once you are discharged, your primary care physician will handle any further medical issues. Please note that NO REFILLS for any discharge medications will be authorized once you are discharged, as it is imperative that you return to your primary care physician (or establish a relationship with a primary care physician if you do not have one) for your aftercare needs so that they can reassess your need for medications and monitor your lab values.  Discharge Instructions    Diet - low sodium heart healthy   Complete by:  As directed    Increase activity slowly   Complete by:  As directed      Allergies as of 11/17/2017      Reactions   Other Other (See Comments)   Oxygen Sensitive   Penicillins Rash, Other (See Comments)   Any -illins as well as Mycins Has patient had a PCN reaction causing immediate rash, facial/tongue/throat swelling, SOB or lightheadedness with hypotension: Yes Has patient had a PCN reaction causing severe rash involving mucus membranes or skin necrosis: Yes Has patient had a PCN reaction that required hospitalization No Has patient had a PCN reaction occurring within the last 10 years: No If all of the above answers are "NO", then may proceed with Cephalosporin use.   Vitamin B12 Itching, Rash, Other (See Comments)   injections      Medication List    TAKE these medications   albuterol (2.5 MG/3ML) 0.083% nebulizer solution Commonly known as:  PROVENTIL Take 3 mLs (2.5 mg total) by nebulization every 6 (six) hours. DX:  J44.9 What changed:    when to take this  reasons to take this  additional instructions   albuterol 108 (90 Base) MCG/ACT  inhaler Commonly known as:  PROVENTIL HFA;VENTOLIN HFA Inhale 2 puffs into the lungs every 6 (six) hours as needed for wheezing or shortness of breath. What changed:  Another medication with the same name was changed. Make sure you understand how and when to take each.   amLODipine 5 MG tablet Commonly known as:  NORVASC Take 1 tablet (5 mg total) by mouth daily.   aspirin EC 81 MG tablet Take 81 mg by mouth daily.   budesonide-formoterol 160-4.5 MCG/ACT inhaler Commonly known as:  SYMBICORT Inhale 2 puffs into the lungs 2 (two) times daily.   calcitRIOL 0.25 MCG capsule Commonly known as:  ROCALTROL Take 0.25 mcg by mouth every other day.   camphor-menthol lotion Commonly known as:  SARNA Apply 1 application topically as needed for itching.   doxycycline 100 MG tablet Commonly known as:  VIBRA-TABS Take 1 tablet (100 mg total) by mouth every 12 (twelve) hours for 5 doses.   fluconazole 100 MG tablet Commonly known as:  DIFLUCAN Take 100 mg by mouth daily.   furosemide 80 MG tablet Commonly known as:  LASIX Take 80 mg by mouth daily.   hydrocerin Crea Apply 1 application topically 2 (two) times daily.   hydrOXYzine 25 MG tablet Commonly  known as:  ATARAX/VISTARIL Take 25 mg by mouth every 6 (six) hours. Itching   insulin aspart 100 UNIT/ML injection Commonly known as:  novoLOG Inject 2-15 Units into the skin 3 (three) times daily before meals. SQ: 121-150= 2 units, 151-200= 3 units, 201-250= 5 units, 251-300= 8 units, 301-350= 11 units, 351-400=15 units, Greater than 400=15 units and Call MD   insulin glargine 100 UNIT/ML injection Commonly known as:  LANTUS Inject 14 Units into the skin at bedtime.   isosorbide mononitrate 30 MG 24 hr tablet Commonly known as:  IMDUR Take 30 mg by mouth daily.   omeprazole 20 MG capsule Commonly known as:  PRILOSEC Take 20 mg by mouth daily.   predniSONE 10 MG tablet Commonly known as:  DELTASONE Take 4 tablets daily for  2 days, 3 tablets daily for 2 days, 2 tablets daily for 2 days, and 1 tablet for 2 days then finish   terbinafine 1 % cream Commonly known as:  LAMISIL AT Apply 1 application topically 2 (two) times daily.   tiotropium 18 MCG inhalation capsule Commonly known as:  SPIRIVA Place 18 mcg into inhaler and inhale daily.   triamcinolone cream 0.1 % Commonly known as:  KENALOG Apply 1 application topically 2 (two) times daily.      Allergies  Allergen Reactions  . Other Other (See Comments)    Oxygen Sensitive  . Penicillins Rash and Other (See Comments)    Any -illins as well as Mycins Has patient had a PCN reaction causing immediate rash, facial/tongue/throat swelling, SOB or lightheadedness with hypotension: Yes Has patient had a PCN reaction causing severe rash involving mucus membranes or skin necrosis: Yes Has patient had a PCN reaction that required hospitalization No Has patient had a PCN reaction occurring within the last 10 years: No If all of the above answers are "NO", then may proceed with Cephalosporin use.   . Vitamin B12 Itching, Rash and Other (See Comments)    injections    Contact information for follow-up providers    Deeann Saint, MD. Schedule an appointment as soon as possible for a visit in 1 week(s).   Specialty:  Family Medicine Contact information: [address] [place] [address] [phone]            Contact information for after-discharge care    Destination    [facility] .   Service:  Skilled Nursing Contact information: [address] [place] [address] [phone]                   The results of significant diagnostics from this hospitalization (including imaging, microbiology, ancillary and laboratory) are listed below for reference.    Significant Diagnostic Studies: Dg Chest 2 View  Result Date: 11/12/2017 CLINICAL DATA:  Short of breath with exertion. EXAM: CHEST  - 2 VIEW COMPARISON:  10/21/2017 FINDINGS: Cardiomegaly. Mild vascular congestion. No Kerley B lines. Mild patchy airspace disease in the right mid and lower lung zones. No pneumothorax or pleural effusion. IMPRESSION: Patchy airspace disease in the right mid and lower lung. Followup PA and lateral chest X-ray is recommended in 3-4 weeks following trial of antibiotic therapy to ensure resolution and exclude underlying malignancy. Cardiomegaly and vascular congestion without pulmonary edema. Electronically Signed   By: Jolaine Click M.D.   On: 11/12/2017 10:42   Dg Chest 2 View  Result Date: 10/18/2017 CLINICAL DATA:  Shortness of breath.  COPD. EXAM: CHEST -  2 VIEW COMPARISON:  10/03/2017. FINDINGS: Mediastinum and hilar structures normal. Cardiomegaly with normal pulmonary vascularity. No acute infiltrates. Low lung volumes with mild basilar atelectasis. No pleural effusion or pneumothorax. IMPRESSION: Cardiomegaly. No pulmonary venous congestion. No acute infiltrates. Mild bibasilar subsegmental atelectasis. Electronically Signed   By: Maisie Fus  Register   On: 10/18/2017 14:24   Ct Chest Wo Contrast  Result Date: 11/14/2017 CLINICAL DATA:  COPD exacerbation. EXAM: CT CHEST WITHOUT CONTRAST TECHNIQUE: Multidetector CT imaging of the chest was performed following the standard protocol without IV contrast. COMPARISON:  Chest x-rays dated 11/12/2017 and chest CT dated 08/18/2017 FINDINGS: Cardiovascular: Aortic atherosclerosis. Extensive coronary artery calcification. Heart size is normal. No pericardial effusion. Mediastinum/Nodes: No enlarged mediastinal or axillary lymph nodes. Thyroid gland, trachea, and esophagus demonstrate no significant findings. Lungs/Pleura: Again noted is centrilobular and paraseptal emphysema. There is slight increased accentuation of the interstitial markings at the right lung base a small focal area of infiltrate posteriorly. Chronic but increased peribronchial thickening bilaterally  with new thickening of the wall of several blebs in the posterior aspect of the right upper lobe. No effusions. Stable tiny nodule in the left lower lobe as previously described. Upper Abdomen: No acute abnormality. Focal dilatation of the abdominal aorta just below the level of the renal arteries to maximum diameter of 3.3 cm. Musculoskeletal: No acute abnormality. Osteophytes fuse much of the midthoracic spine. IMPRESSION: 1. New inflammatory changes in the right upper and lower lobes with a focal area of slight consolidation at the right lung base posteriorly superimposed on emphysema and accentuated chronic bronchitic changes. 2. Aortic Atherosclerosis (ICD10-I70.0) and Emphysema (ICD10-J43.9). Electronically Signed   By: Francene Boyers M.D.   On: 11/14/2017 17:03   Dg Chest Port 1 View  Result Date: 11/17/2017 CLINICAL DATA:  Dyspnea.  History of CHF and COPD.  Follow-up exam. EXAM: PORTABLE CHEST 1 VIEW COMPARISON:  11/12/2017. FINDINGS: Right mid and lower lung opacities have improved from the prior exam. Remainder of the lungs are clear. Lungs are hyperexpanded. Cardiac silhouette is top-normal in size. No mediastinal or hilar masses. No pleural effusion.  No pneumothorax. IMPRESSION: 1. Improved airspace lung opacities in the right mid and lower lung consistent with improved pneumonia. 2. No new lung abnormalities. 3. COPD. Electronically Signed   By: Amie Portland M.D.   On: 11/17/2017 07:41   Dg Chest Port 1 View  Result Date: 10/22/2017 CLINICAL DATA:  Dyspnea EXAM: PORTABLE CHEST 1 VIEW COMPARISON:  10/18/2017 FINDINGS: Stable cardiomegaly. No aortic aneurysm. Mild pulmonary vascular congestion. No pulmonary consolidation, effusion or pneumothorax. Mild osteoarthritic change about both AC and glenohumeral joints. IMPRESSION: Stable cardiomegaly.  Mild vascular congestion. Electronically Signed   By: Tollie Eth M.D.   On: 10/22/2017 01:40   Dg Abd 2 Views  Result Date: 11/17/2017 CLINICAL DATA:   Abdominal distention and constipation. EXAM: ABDOMEN - 2 VIEW COMPARISON:  None. FINDINGS: The lung bases are normal. Contrast seen throughout the colon. No bowel obstruction. No free air, portal venous gas, or pneumatosis. IMPRESSION: Negative. Electronically Signed   By: Gerome Sam III M.D   On: 11/17/2017 12:29   Dg Swallowing Func-speech Pathology  Result Date: 11/16/2017 Objective Swallowing Evaluation: Type of Study: MBS-Modified Barium Swallow Study  Patient Details Name: Yasmin Dibello MRN: [id number] Date of Birth: 1927-06-23 Today's Date: 11/16/2017 Time: SLP Start Time (ACUTE ONLY): 0825 -SLP Stop Time (ACUTE ONLY): 0845 SLP Time Calculation (min) (ACUTE ONLY): 20 min Past Medical History: Past Medical History: Diagnosis Date .  CHF (congestive heart failure) (HCC)  . Chronic kidney disease  . COPD (chronic obstructive pulmonary disease) (HCC)  . GERD (gastroesophageal reflux disease)  . Hypertension  . Respiratory insufficiency  Past Surgical History: No past surgical history on file. HPI: 84 year old male admitted 11/12/17. 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 Chest CT = inflammatory change in RUL/RLL, consolidation RLL  Subjective: Pt resting in recliner. RN reports pt tolerates regular/thin liquids. Got SOB going to bathroom today. Assessment / Plan / Recommendation CHL IP CLINICAL IMPRESSIONS 11/16/2017 Clinical Impression Pt with functional oropharyngeal swallow ability - No aspiration/penetration with all consistencies tested.  Pt with timely and strong swallow - he piecemeals consistently which is functional for him. 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  He did appear with esophageal backflow on MBS today WITHOUT sensation - advised him to said findings and recommended strict esophageal precautions.  SLP Visit Diagnosis Dysphagia, unspecified (R13.10) Attention  and concentration deficit following -- Frontal lobe and executive function deficit following -- Impact on safety and function Mild aspiration risk   CHL IP TREATMENT RECOMMENDATION 11/15/2017 Treatment Recommendations Defer until completion of intrumental exam   Prognosis 11/16/2017 Prognosis for Safe Diet Advancement Good Barriers to Reach Goals -- Barriers/Prognosis Comment -- CHL IP DIET RECOMMENDATION 11/16/2017 SLP Diet Recommendations Regular solids;Thin liquid Liquid Administration via Cup;Straw Medication Administration Whole meds with liquid Compensations Slow rate;Small sips/bites Postural Changes Remain semi-upright after after feeds/meals (Comment);Seated upright at 90 degrees   CHL IP OTHER RECOMMENDATIONS 11/16/2017 Recommended Consults -- Oral Care Recommendations Oral care BID Other Recommendations --   CHL IP FOLLOW UP RECOMMENDATIONS 11/16/2017 Follow up Recommendations None   CHL IP FREQUENCY AND DURATION 11/16/2017 Speech Therapy Frequency (ACUTE ONLY) min 1 x/week Treatment Duration 1 week      CHL IP ORAL PHASE 11/16/2017 Oral Phase WFL Oral - Pudding Teaspoon -- Oral - Pudding Cup -- Oral - Honey Teaspoon -- Oral - Honey Cup -- Oral - Nectar Teaspoon -- Oral - Nectar Cup WFL;Piecemeal swallowing Oral - Nectar Straw -- Oral - Thin Teaspoon -- Oral - Thin Cup WFL;Piecemeal swallowing Oral - Thin Straw Piecemeal swallowing;WFL Oral - Puree Piecemeal swallowing;WFL Oral - Mech Soft -- Oral - Regular Piecemeal swallowing;WFL Oral - Multi-Consistency -- Oral - Pill Piecemeal swallowing;WFL Oral Phase - Comment --  CHL IP PHARYNGEAL PHASE 11/16/2017 Pharyngeal Phase WFL Pharyngeal- Pudding Teaspoon -- Pharyngeal -- Pharyngeal- Pudding Cup -- Pharyngeal -- Pharyngeal- Honey Teaspoon -- Pharyngeal -- Pharyngeal- Honey Cup -- Pharyngeal -- Pharyngeal- Nectar Teaspoon -- Pharyngeal -- Pharyngeal- Nectar Cup WFL Pharyngeal -- Pharyngeal- Nectar Straw -- Pharyngeal -- Pharyngeal- Thin Teaspoon -- Pharyngeal --  Pharyngeal- Thin Cup WFL Pharyngeal -- Pharyngeal- Thin Straw WFL Pharyngeal -- Pharyngeal- Puree WFL Pharyngeal -- Pharyngeal- Mechanical Soft -- Pharyngeal -- Pharyngeal- Regular (No Data) Pharyngeal -- Pharyngeal- Multi-consistency -- Pharyngeal -- Pharyngeal- Pill WFL Pharyngeal -- Pharyngeal Comment --  CHL IP CERVICAL ESOPHAGEAL PHASE 11/16/2017 Cervical Esophageal Phase Impaired Pudding Teaspoon -- Pudding Cup -- Honey Teaspoon -- Honey Cup -- Nectar Teaspoon -- Nectar Cup -- Nectar Straw -- Thin Teaspoon -- Thin Cup -- Thin Straw -- Puree -- Mechanical Soft -- Regular -- Multi-consistency -- Pill -- Cervical Esophageal Comment upon esophageal sweep, pt appeared with retrograde propulsion of barium to proximal esophagus without awareness, barium tablet readily cleared [place] Koller, MS [facility] SLP [phone]  Microbiology: Recent Results (from the past 240 hour(s))  Culture, sputum-assessment     Status: None   Collection Time: 11/12/17  4:05 PM  Result Value Ref Range Status   Specimen Description EXPECTORATED SPUTUM  Final   Special Requests NONE  Final   Sputum evaluation   Final    THIS SPECIMEN IS ACCEPTABLE FOR SPUTUM CULTURE Performed at [facility], [address] [place], [place] [address]    Report Status 11/12/2017 FINAL  Final  Culture, respiratory (NON-Expectorated)     Status: None   Collection Time: 11/12/17  4:05 PM  Result Value Ref Range Status   Specimen Description   Final    EXPECTORATED SPUTUM Performed at [facility], [address] [place], [place] [address]    Special Requests   Final    NONE Reflexed from [id number] Performed at [facility], [address], [place], [place] [address]    Gram Stain   Final    MODERATE WBC PRESENT, PREDOMINANTLY PMN ABUNDANT GRAM POSITIVE RODS    Culture   Final    ABUNDANT CORYNEBACTERIUM STRIATUM Standardized susceptibility testing for  this organism is not available. Performed at [facility], [address] [place], [place] [address]    Report Status 11/15/2017 FINAL  Final  MRSA PCR Screening     Status: Abnormal   Collection Time: 11/12/17  4:17 PM  Result Value Ref Range Status   MRSA by PCR POSITIVE (A) NEGATIVE Final    Comment:        The GeneXpert MRSA Assay (FDA approved for NASAL specimens only), is one component of a comprehensive MRSA colonization surveillance program. It is not intended to diagnose MRSA infection nor to guide or monitor treatment for MRSA infections. RESULT CALLED TO, READ BACK BY AND VERIFIED WITH: D FLOYD RN 0000 11/13/17 A NAVARRO Performed at [facility], [address], [place], [place] [address]      Labs: Basic Metabolic Panel: Recent Labs  Lab 11/12/17 1029 11/13/17 0515 11/14/17 0530 11/15/17 0547 11/16/17 0553 11/17/17 0554  NA 143 141 135 136 139 139  K 3.0* 4.9 4.5 4.5 4.6 4.8  CL 99* 103 101 100* 100* 101  CO2 30 25 26 26  32 28  GLUCOSE 146* 156* 135* 159* 123* 111*  BUN 33* 35* 46* 59* 57* 48*  CREATININE 1.98* 2.14* 2.10* 2.40* 2.12* 1.77*  CALCIUM 9.2 8.9 9.1 9.0 9.0 9.4  MG 2.0  --   --   --   --   --    Liver Function Tests: Recent Labs  Lab 11/12/17 1029 11/12/17 1612  AST 37 41  ALT 56 57  ALKPHOS 76 73  BILITOT 0.6 0.5  PROT 7.2 6.8  ALBUMIN 3.4* 3.2*   No results for input(s): LIPASE, AMYLASE in the last 168 hours. No results for input(s): AMMONIA in the last 168 hours. CBC: Recent Labs  Lab 11/12/17 1612 11/13/17 0515 11/14/17 0530 11/15/17 0547 11/16/17 0553 11/17/17 0554  WBC 11.2* 12.3* 14.2* 11.9* 12.6* 15.7*  NEUTROABS 10.6*  --   --  9.8* 10.0 12.7*  HGB 13.4 11.4* 11.3* 11.9* 11.9* 12.0*  HCT 42.5 36.3* 36.2* 37.4* 37.7* 37.5*  MCV 84.5 83.4 84.0 84.0 84.3 83.1  PLT 276 268 251 246 245 266   Cardiac Enzymes: No results for input(s): CKTOTAL, CKMB, CKMBINDEX, TROPONINI in the last 168  hours. BNP: BNP (last 3 results) Recent Labs  08/14/17 1717 10/18/17 1611 11/12/17 1029  BNP 96.7 147.6* 134.9*    ProBNP (last 3 results) No results for input(s): PROBNP in the last 8760 hours.  CBG: Recent Labs  Lab 11/15/17 1639 11/16/17 0736 11/16/17 1152 11/16/17 1707 11/16/17 2012  GLUCAP 232* 91 150* 281* 257*       Signed:  Briant Cedar, MD [facility] 11/17/2017, 1:25 PM

## 2017-11-19 ENCOUNTER — Non-Acute Institutional Stay (SKILLED_NURSING_FACILITY): Payer: Medicare Other | Admitting: Internal Medicine

## 2017-11-19 ENCOUNTER — Encounter: Payer: Self-pay | Admitting: Internal Medicine

## 2017-11-19 DIAGNOSIS — J9621 Acute and chronic respiratory failure with hypoxia: Secondary | ICD-10-CM

## 2017-11-19 DIAGNOSIS — J441 Chronic obstructive pulmonary disease with (acute) exacerbation: Secondary | ICD-10-CM

## 2017-11-19 DIAGNOSIS — J690 Pneumonitis due to inhalation of food and vomit: Secondary | ICD-10-CM | POA: Diagnosis not present

## 2017-11-19 DIAGNOSIS — I10 Essential (primary) hypertension: Secondary | ICD-10-CM | POA: Diagnosis not present

## 2017-11-19 DIAGNOSIS — R9431 Abnormal electrocardiogram [ECG] [EKG]: Secondary | ICD-10-CM | POA: Diagnosis not present

## 2017-11-19 DIAGNOSIS — R21 Rash and other nonspecific skin eruption: Secondary | ICD-10-CM

## 2017-11-19 DIAGNOSIS — E1169 Type 2 diabetes mellitus with other specified complication: Secondary | ICD-10-CM | POA: Diagnosis not present

## 2017-11-19 DIAGNOSIS — K219 Gastro-esophageal reflux disease without esophagitis: Secondary | ICD-10-CM

## 2017-11-19 DIAGNOSIS — J189 Pneumonia, unspecified organism: Secondary | ICD-10-CM | POA: Diagnosis not present

## 2017-11-19 DIAGNOSIS — Z794 Long term (current) use of insulin: Secondary | ICD-10-CM

## 2017-11-19 DIAGNOSIS — N184 Chronic kidney disease, stage 4 (severe): Secondary | ICD-10-CM

## 2017-11-19 LAB — GLUCOSE, CAPILLARY
GLUCOSE-CAPILLARY: 139 mg/dL — AB (ref 65–99)
Glucose-Capillary: 87 mg/dL (ref 65–99)
Glucose-Capillary: 112 mg/dL — ABNORMAL HIGH (ref 65–99)

## 2017-11-19 NOTE — Progress Notes (Signed)
: Provider:  Randon GoldsmithAnne D. Lyn HollingsheadAlexander, MD Location:  [facility] Room Number: [phone] Place of Service:  SNF ([phone])  PCP: Deeann SaintBanks, Shannon R, MD Patient Care Team: Deeann SaintBanks, Shannon R, MD as PCP - General (Family Medicine)  Extended Emergency Contact Information Primary Emergency Contact: Cathlyn ParsonsWaller,[name]  [place] Home Phone: [phone] Relation: Sister     Allergies: Other; Penicillins; and Vitamin b12  Chief Complaint  Patient presents with  . New Admit To SNF    Admit to Facility     HPI: Patient is 84 y.o. male just of heart failure EF 45%, PD not on home O2, chronic kidney disease stage IV who presented with shortness of breath for 2 days.  PD flare at the beginning of the month.  Since then he been slowly returning to baseline but in the last 2 days prior to admission he noticed he was more short of breath.  He was treated in skilled nursing facility with nebs and steroids and started on antibiotic and initially improved then relapsed the next day and was sent to the emergency department failed outpatient treatment.  On presentation to the emergency department patient's heart rate was 110, respirations 27 potassium 3 creatinine 1.8 with a baseline of 2 WBC of 11.7.  Chest x-ray showed some right lower lobe opacity consistent with edema or pneumonia.  Was started on Levaquin and Solu-Medrol.  Patient was [facility] for pneumonia and acute COPD exacerbation.  Patient improved with IV steroids nebs and antibiotics and is admitted back to skilled nursing facility.  While at skilled nursing facility patient will be followed for hypertension treated with Norvasc, GERD treated with Prilosec and diabetes treated with insulin.  Past Medical History:  Diagnosis Date  . CHF (congestive heart failure) (HCC)   . Chronic kidney disease   . COPD (chronic obstructive pulmonary disease) (HCC)   . GERD (gastroesophageal reflux disease)   .  Hypertension   . Respiratory insufficiency     History reviewed. No pertinent surgical history.  Allergies as of 11/19/2017      Reactions   Other Other (See Comments)   Oxygen Sensitive   Penicillins Rash, Other (See Comments)   Any -illins as well as Mycins Has patient had a PCN reaction causing immediate rash, facial/tongue/throat swelling, SOB or lightheadedness with hypotension: Yes Has patient had a PCN reaction causing severe rash involving mucus membranes or skin necrosis: Yes Has patient had a PCN reaction that required hospitalization No Has patient had a PCN reaction occurring within the last 10 years: No If all of the above answers are "NO", then may proceed with Cephalosporin use.   Vitamin B12 Itching, Rash, Other (See Comments)   injections      Medication List        Accurate as of 11/19/17  1:20 PM. Always use your most recent med list.          albuterol (2.5 MG/3ML) 0.083% nebulizer solution Commonly known as:  PROVENTIL Take 3 mLs (2.5 mg total) by nebulization every 6 (six) hours. DX:  J44.9   albuterol 108 (90 Base) MCG/ACT inhaler Commonly known as:  PROVENTIL HFA;VENTOLIN HFA Inhale 2 puffs into the lungs every 6 (six) hours as needed for wheezing or shortness of breath.   amLODipine 5 MG tablet Commonly known as:  NORVASC Take 1 tablet (5 mg total) by mouth daily.   aspirin EC 81 MG tablet Take 81 mg by mouth  daily.   budesonide-formoterol 160-4.5 MCG/ACT inhaler Commonly known as:  SYMBICORT Inhale 2 puffs into the lungs 2 (two) times daily.   calcitRIOL 0.25 MCG capsule Commonly known as:  ROCALTROL Take 0.25 mcg by mouth every other day.   camphor-menthol lotion Commonly known as:  SARNA Apply 1 application topically as needed for itching.   doxycycline 100 MG tablet Commonly known as:  VIBRA-TABS Take 1 tablet (100 mg total) by mouth every 12 (twelve) hours for 5 doses.   fluconazole 100 MG tablet Commonly known as:  DIFLUCAN Take  100 mg by mouth daily.   furosemide 80 MG tablet Commonly known as:  LASIX Take 80 mg by mouth daily.   hydrocerin Crea Apply 1 application topically 2 (two) times daily.   hydrOXYzine 25 MG tablet Commonly known as:  ATARAX/VISTARIL Take 25 mg by mouth every 6 (six) hours. Itching   insulin aspart 100 UNIT/ML injection Commonly known as:  novoLOG Inject 2-15 Units into the skin 3 (three) times daily before meals. SQ: 121-150= 2 units, 151-200= 3 units, 201-250= 5 units, 251-300= 8 units, 301-350= 11 units, 351-400=15 units, Greater than 400=15 units and Call MD   insulin glargine 100 UNIT/ML injection Commonly known as:  LANTUS Inject 14 Units into the skin at bedtime.   isosorbide mononitrate 30 MG 24 hr tablet Commonly known as:  IMDUR Take 30 mg by mouth daily.   omeprazole 20 MG capsule Commonly known as:  PRILOSEC Take 20 mg by mouth daily.   predniSONE 10 MG tablet Commonly known as:  DELTASONE Take 4 tablets daily for 2 days, 3 tablets daily for 2 days, 2 tablets daily for 2 days, and 1 tablet for 2 days then finish   terbinafine 1 % cream Commonly known as:  LAMISIL AT Apply 1 application topically 2 (two) times daily.   tiotropium 18 MCG inhalation capsule Commonly known as:  SPIRIVA Place 18 mcg into inhaler and inhale daily.   triamcinolone cream 0.1 % Commonly known as:  KENALOG Apply 1 application topically 2 (two) times daily.       No orders of the defined types were placed in this encounter.   Immunization History  Administered Date(s) Administered  . Influenza, High Dose Seasonal PF 05/06/2017  . Influenza,inj,Quad PF,6+ Mos 02/15/2016  . Influenza-Unspecified 05/03/2015  . Pneumococcal Conjugate-13 04/19/2016  . Pneumococcal Polysaccharide-23 09/18/2015    Social History   Tobacco Use  . Smoking status: Former Smoker    Packs/day: 0.50    Years: 70.00    Pack years: 35.00    Types: Cigarettes    Last attempt to quit: 04/19/2013     Years since quitting: 4.5  . Smokeless tobacco: Never Used  Substance Use Topics  . Alcohol use: No    Alcohol/week: 0.0 oz    Family history is   Family History  Problem Relation Age of Onset  . Tuberculosis Mother   . Diabetes Father       Review of Systems  DATA OBTAINED: from patient,  family member GENERAL:  no fevers, fatigue, appetite changes SKIN: No itching, or rash EYES: No eye pain, redness, discharge EARS: No earache, tinnitus, change in hearing NOSE: No congestion, drainage or bleeding  MOUTH/THROAT: No mouth or tooth pain, No sore throat RESPIRATORY: No cough, wheezing, SOB CARDIAC: No chest pain, palpitations, lower extremity edema  GI: No abdominal pain, No N/V/D or constipation, No heartburn or reflux  GU: No dysuria, frequency or urgency, or incontinence  MUSCULOSKELETAL: No unrelieved bone/joint pain NEUROLOGIC: No headache, dizziness or focal weakness PSYCHIATRIC: No c/o anxiety or sadness   Vitals:   11/19/17 1304  BP: (!) 145/76  Pulse: 67  Resp: 16  Temp: (!) 97 F (36.1 C)  SpO2: 92%    SpO2 Readings from Last 1 Encounters:  11/19/17 92%   Body mass index is 27.69 kg/m.     Physical Exam  GENERAL APPEARANCE: Alert, conversant,  No acute distress.  SKIN: No diaphoresis rash HEAD: Normocephalic, atraumatic  EYES: Conjunctiva/lids clear. Pupils round, reactive. EOMs intact.  EARS: External exam WNL, canals clear. Hearing grossly normal.  NOSE: No deformity or discharge.  MOUTH/THROAT: Lips w/o lesions  RESPIRATORY: Breathing is even, unlabored. Lung sounds are wheezing CARDIOVASCULAR: Heart RRR no murmurs, rubs or gallops. No peripheral edema.   GASTROINTESTINAL: Abdomen is soft, non-tender, not distended w/ normal bowel sounds. GENITOURINARY: Bladder non tender, not distended  MUSCULOSKELETAL: No abnormal joints or musculature NEUROLOGIC:  Cranial nerves 2-12 grossly intact. Moves all extremities  PSYCHIATRIC: Mood and affect  appropriate to situation, no behavioral issues  Patient Active Problem List   Diagnosis Date Noted  . COPD with acute exacerbation (HCC) 10/18/2017  . Type 2 diabetes mellitus with other specified complication (HCC) 10/12/2017  . Pruritic rash 08/14/2017  . Pain and swelling of toe of left foot 08/14/2017  . Pressure injury of skin 04/12/2016  . CKD (chronic kidney disease), stage IV (HCC) 04/11/2016  . QT prolongation 04/11/2016  . Hypokalemia 04/11/2016  . Chronic combined systolic and diastolic CHF (congestive heart failure) (HCC) 01/11/2016  . GERD (gastroesophageal reflux disease) 05/28/2015  . COPD (chronic obstructive pulmonary disease) (HCC) 05/10/2015  . Essential hypertension 05/10/2015      Labs reviewed: Basic Metabolic Panel:    Component Value Date/Time   NA 139 11/17/2017 0554   NA 143 11/12/2017   K 4.8 11/17/2017 0554   CL 101 11/17/2017 0554   CO2 28 11/17/2017 0554   GLUCOSE 111 (H) 11/17/2017 0554   BUN 48 (H) 11/17/2017 0554   BUN 32 (A) 11/12/2017   CREATININE 1.77 (H) 11/17/2017 0554   CALCIUM 9.4 11/17/2017 0554   PROT 6.8 11/12/2017 1612   ALBUMIN 3.2 (L) 11/12/2017 1612   AST 41 11/12/2017 1612   ALT 57 11/12/2017 1612   ALKPHOS 73 11/12/2017 1612   BILITOT 0.5 11/12/2017 1612   GFRNONAA 33 (L) 11/17/2017 0554   GFRAA 38 (L) 11/17/2017 0554    Recent Labs    10/18/17 1611  10/20/17 0517  11/12/17 1029  11/15/17 0547 11/16/17 0553 11/17/17 0554  NA 147*   < > 140   < > 143   < > 136 139 139  K 3.5   < > 4.0   < > 3.0*   < > 4.5 4.6 4.8  CL 103   < > 99*   < > 99*   < > 100* 100* 101  CO2 27   < > 26   < > 30   < > 26 32 28  GLUCOSE 131*   < > 178*   < > 146*   < > 159* 123* 111*  BUN 22*   < > 37*   < > 33*   < > 59* 57* 48*  CREATININE 2.47*   < > 2.30*   < > 1.98*   < > 2.40* 2.12* 1.77*  CALCIUM 9.9   < > 9.5   < > 9.2   < >  9.0 9.0 9.4  MG 2.2  --  2.6*  --  2.0  --   --   --   --    < > = values in this interval not displayed.    Liver Function Tests: Recent Labs    08/21/17 0543 11/12/17 1029 11/12/17 1612  AST 34 37 41  ALT 67* 56 57  ALKPHOS 51 76 73  BILITOT 0.6 0.6 0.5  PROT 5.7* 7.2 6.8  ALBUMIN 2.8* 3.4* 3.2*   No results for input(s): LIPASE, AMYLASE in the last 8760 hours. No results for input(s): AMMONIA in the last 8760 hours. CBC: Recent Labs    11/15/17 0547 11/16/17 0553 11/17/17 0554  WBC 11.9* 12.6* 15.7*  NEUTROABS 9.8* 10.0 12.7*  HGB 11.9* 11.9* 12.0*  HCT 37.4* 37.7* 37.5*  MCV 84.0 84.3 83.1  PLT 246 245 266   Lipid No results for input(s): CHOL, HDL, LDLCALC, TRIG in the last 8760 hours.  Cardiac Enzymes: Recent Labs    08/14/17 2207 08/15/17 0746 08/15/17 1045  TROPONINI 0.03* 0.03* 0.03*   BNP: Recent Labs    08/14/17 1717 10/18/17 1611 11/12/17 1029  BNP 96.7 147.6* 134.9*   No results found for: MICROALBUR Lab Results  Component Value Date   HGBA1C 8.1 (H) 11/13/2017   Lab Results  Component Value Date   TSH 0.689 11/13/2017   No results found for: VITAMINB12 No results found for: FOLATE No results found for: IRON, TIBC, FERRITIN  Imaging and Procedures obtained prior to SNF admission: Dg Chest 2 View  Result Date: 11/12/2017 CLINICAL DATA:  Short of breath with exertion. EXAM: CHEST - 2 VIEW COMPARISON:  10/21/2017 FINDINGS: Cardiomegaly. Mild vascular congestion. No Kerley B lines. Mild patchy airspace disease in the right mid and lower lung zones. No pneumothorax or pleural effusion. IMPRESSION: Patchy airspace disease in the right mid and lower lung. Followup PA and lateral chest X-ray is recommended in 3-4 weeks following trial of antibiotic therapy to ensure resolution and exclude underlying malignancy. Cardiomegaly and vascular congestion without pulmonary edema. Electronically Signed   By: Jolaine Click M.D.   On: 11/12/2017 10:42     Not all labs, radiology exams or other studies done during hospitalization come through on my EPIC note;  however they are reviewed by me.    Assessment and Plan  Acute on chronic hypoxic respiratory failure/H CAP/aspiration pneumonia/COPD exacerbation- antibiotic and viral panel negative, chest x-ray showing a right mid and right lower lobe congestion/infiltrate CT chest done in 08/2017 showed nodule within the right lower lobe and one in the left lower lobe as well follow-up CT in 6 to 8 weeks.  CT chest showed no inflammatory changes in the right upper and lower lobes with a focal area of slight consolidation in the right lung base superimposed on emphysema and accentuated chronic bronchitic changes- no further recommendations no further evaluation needed SNF -admitted for OT/PT; continue doxycycline 100 mg twice daily for 5 more days as well as inhalers and prednisone taper  Chronic kidney disease stage IV-stable, better than baseline of 2-2.5 SNF -discharge creatinine 1.77; follow-up BMP  Diabetes mellitus type 2-hemoglobin A1c 8.1 SNF -continue Lantus insulin 14 units at bedtime and sliding scale insulin with meals  GERD SNF -not stated as uncontrolled; continue omeprazole 20 mg daily  Hypertension SNF -controlled; continue Norvasc 5 mg daily  Macular/morbilliform rash-ongoing for over a month SNF -follow-up with dermatology as outpatient; continue multiple creams that were started on patient's prior hospitalization  including Hydroserpine chorea cream twice a day, terbinafine 1% cream 2 times daily Kenalog 1 application 2 times daily; per patient and wife rash responds best to Sarna lotion applied daily as needed    Time spent greater than 45 minutes;> 50% of time with patient was spent reviewing records, labs, tests and studies, counseling and developing plan of care  Thurston Hole D. [name], MD

## 2017-11-22 ENCOUNTER — Encounter: Payer: Self-pay | Admitting: Internal Medicine

## 2017-11-22 DIAGNOSIS — J9621 Acute and chronic respiratory failure with hypoxia: Secondary | ICD-10-CM | POA: Insufficient documentation

## 2017-11-22 DIAGNOSIS — R21 Rash and other nonspecific skin eruption: Secondary | ICD-10-CM | POA: Insufficient documentation

## 2017-11-22 DIAGNOSIS — J189 Pneumonia, unspecified organism: Secondary | ICD-10-CM | POA: Insufficient documentation

## 2017-11-22 DIAGNOSIS — J690 Pneumonitis due to inhalation of food and vomit: Secondary | ICD-10-CM | POA: Insufficient documentation

## 2017-11-22 LAB — BASIC METABOLIC PANEL
BUN: 44 — AB (ref 4–21)
Creatinine: 2.3 — AB (ref 0.6–1.3)
GLUCOSE: 285
POTASSIUM: 4.2 (ref 3.4–5.3)
SODIUM: 137 (ref 137–147)

## 2017-11-22 LAB — CBC AND DIFFERENTIAL
HEMATOCRIT: 36 — AB (ref 41–53)
HEMOGLOBIN: 11.4 — AB (ref 13.5–17.5)
Platelets: 238 (ref 150–399)
WBC: 12.4

## 2017-11-27 ENCOUNTER — Non-Acute Institutional Stay (SKILLED_NURSING_FACILITY): Payer: Medicare Other | Admitting: Internal Medicine

## 2017-11-27 DIAGNOSIS — R635 Abnormal weight gain: Secondary | ICD-10-CM | POA: Diagnosis not present

## 2017-11-27 DIAGNOSIS — R609 Edema, unspecified: Secondary | ICD-10-CM | POA: Diagnosis not present

## 2017-11-28 ENCOUNTER — Encounter: Payer: Self-pay | Admitting: Internal Medicine

## 2017-11-28 LAB — BASIC METABOLIC PANEL
BUN: 36 — AB (ref 4–21)
CREATININE: 2 — AB (ref 0.6–1.3)
Glucose: 270
POTASSIUM: 3.9 (ref 3.4–5.3)
SODIUM: 140 (ref 137–147)

## 2017-11-28 NOTE — Progress Notes (Signed)
Opened in error

## 2017-11-28 NOTE — Progress Notes (Signed)
[facility] Room Number: 507 Place of Service:  SNF [phone]  Provider: Randon GoldsmithAnne D. Lyn HollingsheadAlexander, MD Greg Jones, Greg R, MD  Patient Care Team: Greg Jones, Greg R, MD as PCP - General [facility](Family Medicine)  Extended Emergency Contact Information Primary Emergency Contact: Greg ParsonsWaller,Greg Jones  [place] Home Phone: [phone] Relation: Sister    Allergies: Other; Penicillins; and Vitamin b12  Chief Complaint  Patient presents with  . Acute Visit    weight gain    HPI: Patient is 84 y.o. male who is seen for a weight gain over 7 days from 176.4 pounds to 18.1 pounds.  Patient denies chest pain or shortness of breath.  Does have pedal edema.  Past Medical History:  Diagnosis Date  . CHF (congestive heart failure) (HCC)   . Chronic kidney disease   . COPD (chronic obstructive pulmonary disease) (HCC)   . GERD (gastroesophageal reflux disease)   . Hypertension   . Respiratory insufficiency     History reviewed. No pertinent surgical history.  Allergies as of 11/27/2017      Reactions   Other Other (See Comments)   Oxygen Sensitive   Penicillins Rash, Other (See Comments)   Any -illins as well as Mycins Has patient had a PCN reaction causing immediate rash, facial/tongue/throat swelling, SOB or lightheadedness with hypotension: Yes Has patient had a PCN reaction causing severe rash involving mucus membranes or skin necrosis: Yes Has patient had a PCN reaction that required hospitalization No Has patient had a PCN reaction occurring within the last 10 years: No If all of the above answers are "NO", then may proceed with Cephalosporin use.   Vitamin B12 Itching, Rash, Other (See Comments)   injections      Medication List        Accurate as of 11/27/17 11:59 PM. Always use your most recent med list.          albuterol (2.5 MG/3ML) 0.083% nebulizer solution Commonly known as:  PROVENTIL Take 3 mLs (2.5 mg total) by  nebulization every 6 (six) hours. DX:  J44.9   albuterol 108 (90 Base) MCG/ACT inhaler Commonly known as:  PROVENTIL HFA;VENTOLIN HFA Inhale 2 puffs into the lungs every 6 (six) hours as needed for wheezing or shortness of breath.   amLODipine 10 MG tablet Commonly known as:  NORVASC Take 10 mg by mouth daily.   aspirin EC 81 MG tablet Take 81 mg by mouth daily.   budesonide-formoterol 160-4.5 MCG/ACT inhaler Commonly known as:  SYMBICORT Inhale 2 puffs into the lungs 2 (two) times daily.   calcitRIOL 0.25 MCG capsule Commonly known as:  ROCALTROL Take 0.25 mcg by mouth every other day.   camphor-menthol lotion Commonly known as:  SARNA Apply 1 application topically as needed for itching.   clotrimazole 1 % cream Commonly known as:  LOTRIMIN Apply 1 application topically daily.   fluconazole 100 MG tablet Commonly known as:  DIFLUCAN Take 100 mg by mouth daily.   furosemide 80 MG tablet Commonly known as:  LASIX Take 80 mg by mouth daily.   hydrocerin Crea Apply 1 application topically 2 (two) times daily.   hydrOXYzine 25 MG tablet Commonly known as:  ATARAX/VISTARIL Take 25 mg by mouth every 6 (six) hours. Itching   insulin aspart 100 UNIT/ML injection Commonly known as:  novoLOG Inject 2-15 Units into the skin 3 (three) times daily before meals. SQ: 121-150= 2 units, 151-200= 3 units, 201-250= 5 units, 251-300= 8  units, 301-350= 11 units, 351-400=15 units, Greater than 400=15 units and Call MD   insulin glargine 100 UNIT/ML injection Commonly known as:  LANTUS Inject 14 Units into the skin at bedtime.   isosorbide mononitrate 30 MG 24 hr tablet Commonly known as:  IMDUR Take 30 mg by mouth daily.   LORazepam 0.5 MG tablet Commonly known as:  ATIVAN Take 0.5 mg by mouth 2 (two) times daily.   omeprazole 20 MG capsule Commonly known as:  PRILOSEC Take 20 mg by mouth daily.   tiotropium 18 MCG inhalation capsule Commonly known as:  SPIRIVA Place 18  mcg into inhaler and inhale daily.   triamcinolone cream 0.1 % Commonly known as:  KENALOG Apply 1 application topically 2 (two) times daily.       No orders of the defined types were placed in this encounter.   Immunization History  Administered Date(s) Administered  . Influenza, High Dose Seasonal PF 05/06/2017  . Influenza,inj,Quad PF,6+ Mos 02/15/2016  . Influenza-Unspecified 05/03/2015  . Pneumococcal Conjugate-13 04/19/2016  . Pneumococcal Polysaccharide-23 09/18/2015    Social History   Tobacco Use  . Smoking status: Former Smoker    Packs/day: 0.50    Years: 70.00    Pack years: 35.00    Types: Cigarettes    Last attempt to quit: 04/19/2013    Years since quitting: 4.6  . Smokeless tobacco: Never Used  Substance Use Topics  . Alcohol use: No    Alcohol/week: 0.0 oz    Review of Systems  DATA OBTAINED: from patient, nurse GENERAL:  no fevers, fatigue, appetite changes SKIN: No itching, rash HEENT: No complaint RESPIRATORY: No cough, wheezing, SOB CARDIAC: No chest pain, palpitations, lower extremity edema  GI: No abdominal pain, No N/V/D or constipation, No heartburn or reflux  GU: No dysuria, frequency or urgency, or incontinence  MUSCULOSKELETAL: No unrelieved bone/joint pain NEUROLOGIC: No headache, dizziness  PSYCHIATRIC: No overt anxiety or sadness  Vitals:   11/28/17 1508  BP: 134/76  Pulse: 70  Resp: 18  Temp: 98.1 F (36.7 C)  SpO2: 98%   Body mass index is 27.43 kg/m. Physical Exam  GENERAL APPEARANCE: Alert, conversant, No acute distress  SKIN: No diaphoresis rash HEENT: Unremarkable RESPIRATORY: Breathing is even, unlabored. Lung sounds are clear   CARDIOVASCULAR: Heart RRR no murmurs, rubs or gallops.  1-1/2-2+ peripheral edema to mid leg GASTROINTESTINAL: Abdomen is soft, non-tender, not distended w/ normal bowel sounds.  GENITOURINARY: Bladder non tender, not distended  MUSCULOSKELETAL: No abnormal joints or  musculature NEUROLOGIC: Cranial nerves 2-12 grossly intact. Moves all extremities PSYCHIATRIC: Mood and affect appropriate to situation, no behavioral issues  Patient Active Problem List   Diagnosis Date Noted  . Acute on chronic respiratory failure with hypoxemia (HCC) 11/22/2017  . HCAP (healthcare-associated pneumonia) 11/22/2017  . Aspiration pneumonia (HCC) 11/22/2017  . Macular rash 11/22/2017  . COPD with acute exacerbation (HCC) 10/18/2017  . Type 2 diabetes mellitus with other specified complication (HCC) 10/12/2017  . Pruritic rash 08/14/2017  . Pain and swelling of toe of left foot 08/14/2017  . Pressure injury of skin 04/12/2016  . CKD (chronic kidney disease), stage IV (HCC) 04/11/2016  . QT prolongation 04/11/2016  . Hypokalemia 04/11/2016  . Chronic combined systolic and diastolic CHF (congestive heart failure) (HCC) 01/11/2016  . GERD (gastroesophageal reflux disease) 05/28/2015  . COPD (chronic obstructive pulmonary disease) (HCC) 05/10/2015  . Essential hypertension 05/10/2015    CMP     Component Value Date/Time  NA 137 11/22/2017   K 4.2 11/22/2017   CL 101 11/17/2017 0554   CO2 28 11/17/2017 0554   GLUCOSE 111 (H) 11/17/2017 0554   BUN 44 (A) 11/22/2017   CREATININE 2.3 (A) 11/22/2017   CREATININE 1.77 (H) 11/17/2017 0554   CALCIUM 9.4 11/17/2017 0554   PROT 6.8 11/12/2017 1612   ALBUMIN 3.2 (L) 11/12/2017 1612   AST 41 11/12/2017 1612   ALT 57 11/12/2017 1612   ALKPHOS 73 11/12/2017 1612   BILITOT 0.5 11/12/2017 1612   GFRNONAA 33 (L) 11/17/2017 0554   GFRAA 38 (L) 11/17/2017 0554   Recent Labs    10/18/17 1611  10/20/17 0517  11/12/17 1029  11/15/17 0547 11/16/17 0553 11/17/17 0554 11/22/17  NA 147*   < > 140   < > 143   < > 136 139 139 137  K 3.5   < > 4.0   < > 3.0*   < > 4.5 4.6 4.8 4.2  CL 103   < > 99*   < > 99*   < > 100* 100* 101  --   CO2 27   < > 26   < > 30   < > 26 32 28  --   GLUCOSE 131*   < > 178*   < > 146*   < > 159*  123* 111*  --   BUN 22*   < > 37*   < > 33*   < > 59* 57* 48* 44*  CREATININE 2.47*   < > 2.30*   < > 1.98*   < > 2.40* 2.12* 1.77* 2.3*  CALCIUM 9.9   < > 9.5   < > 9.2   < > 9.0 9.0 9.4  --   MG 2.2  --  2.6*  --  2.0  --   --   --   --   --    < > = values in this interval not displayed.   Recent Labs    08/21/17 0543 11/12/17 1029 11/12/17 1612  AST 34 37 41  ALT 67* 56 57  ALKPHOS 51 76 73  BILITOT 0.6 0.6 0.5  PROT 5.7* 7.2 6.8  ALBUMIN 2.8* 3.4* 3.2*   Recent Labs    11/15/17 0547 11/16/17 0553 11/17/17 0554 11/22/17  WBC 11.9* 12.6* 15.7* 12.4  NEUTROABS 9.8* 10.0 12.7*  --   HGB 11.9* 11.9* 12.0* 11.4*  HCT 37.4* 37.7* 37.5* 36*  MCV 84.0 84.3 83.1  --   PLT 246 245 266 238   No results for input(s): CHOL, LDLCALC, TRIG in the last 8760 hours.  Invalid input(s): HCL No results found for: MICROALBUR Lab Results  Component Value Date   TSH 0.689 11/13/2017   Lab Results  Component Value Date   HGBA1C 8.1 (H) 11/13/2017   No results found for: CHOL, HDL, LDLCALC, LDLDIRECT, TRIG, CHOLHDL  Significant Diagnostic Results in last 30 days:  Dg Chest 2 View  Result Date: 11/12/2017 CLINICAL DATA:  Short of breath with exertion. EXAM: CHEST - 2 VIEW COMPARISON:  10/21/2017 FINDINGS: Cardiomegaly. Mild vascular congestion. No Kerley B lines. Mild patchy airspace disease in the right mid and lower lung zones. No pneumothorax or pleural effusion. IMPRESSION: Patchy airspace disease in the right mid and lower lung. Followup PA and lateral chest X-ray is recommended in 3-4 weeks following trial of antibiotic therapy to ensure resolution and exclude underlying malignancy. Cardiomegaly and vascular congestion without pulmonary edema. Electronically Signed  By: Jolaine Click M.D.   On: 11/12/2017 10:42   Ct Chest Wo Contrast  Result Date: 11/14/2017 CLINICAL DATA:  COPD exacerbation. EXAM: CT CHEST WITHOUT CONTRAST TECHNIQUE: Multidetector CT imaging of the chest was  performed following the standard protocol without IV contrast. COMPARISON:  Chest x-rays dated 11/12/2017 and chest CT dated 08/18/2017 FINDINGS: Cardiovascular: Aortic atherosclerosis. Extensive coronary artery calcification. Heart size is normal. No pericardial effusion. Mediastinum/Nodes: No enlarged mediastinal or axillary lymph nodes. Thyroid gland, trachea, and esophagus demonstrate no significant findings. Lungs/Pleura: Again noted is centrilobular and paraseptal emphysema. There is slight increased accentuation of the interstitial markings at the right lung base a small focal area of infiltrate posteriorly. Chronic but increased peribronchial thickening bilaterally with new thickening of the wall of several blebs in the posterior aspect of the right upper lobe. No effusions. Stable tiny nodule in the left lower lobe as previously described. Upper Abdomen: No acute abnormality. Focal dilatation of the abdominal aorta just below the level of the renal arteries to maximum diameter of 3.3 cm. Musculoskeletal: No acute abnormality. Osteophytes fuse much of the midthoracic spine. IMPRESSION: 1. New inflammatory changes in the right upper and lower lobes with a focal area of slight consolidation at the right lung base posteriorly superimposed on emphysema and accentuated chronic bronchitic changes. 2. Aortic Atherosclerosis (ICD10-I70.0) and Emphysema (ICD10-J43.9). Electronically Signed   By: Francene Boyers M.D.   On: 11/14/2017 17:03   Dg Chest Port 1 View  Result Date: 11/17/2017 CLINICAL DATA:  Dyspnea.  History of CHF and COPD.  Follow-up exam. EXAM: PORTABLE CHEST 1 VIEW COMPARISON:  11/12/2017. FINDINGS: Right mid and lower lung opacities have improved from the prior exam. Remainder of the lungs are clear. Lungs are hyperexpanded. Cardiac silhouette is top-normal in size. No mediastinal or hilar masses. No pleural effusion.  No pneumothorax. IMPRESSION: 1. Improved airspace lung opacities in the right mid  and lower lung consistent with improved pneumonia. 2. No new lung abnormalities. 3. COPD. Electronically Signed   By: Amie Portland M.D.   On: 11/17/2017 07:41   Dg Abd 2 Views  Result Date: 11/17/2017 CLINICAL DATA:  Abdominal distention and constipation. EXAM: ABDOMEN - 2 VIEW COMPARISON:  None. FINDINGS: The lung bases are normal. Contrast seen throughout the colon. No bowel obstruction. No free air, portal venous gas, or pneumatosis. IMPRESSION: Negative. Electronically Signed   By: Gerome Sam III M.D   On: 11/17/2017 12:29   Dg Swallowing Func-speech Pathology  Result Date: 11/16/2017 Objective Swallowing Evaluation: Type of Study: MBS-Modified Barium Swallow Study  Patient Details Name: Greg Jones MRN: [id number] Date of Birth: [birth year] Today's Date: 11/16/2017 Time: SLP Start Time (ACUTE ONLY): 0825 -SLP Stop Time (ACUTE ONLY): 0845 SLP Time Calculation (min) (ACUTE ONLY): 20 min Past Medical History: Past Medical History: Diagnosis Date . CHF (congestive heart failure) (HCC)  . Chronic kidney disease  . COPD (chronic obstructive pulmonary disease) (HCC)  . GERD (gastroesophageal reflux disease)  . Hypertension  . Respiratory insufficiency  Past Surgical History: No past surgical history on file. HPI: 84 year old male admitted 11/12/17. 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 Chest CT = inflammatory change in RUL/RLL, consolidation RLL  Subjective: Pt resting in recliner. RN reports pt tolerates regular/thin liquids. Got SOB going to bathroom today. Assessment / Plan / Recommendation CHL IP CLINICAL IMPRESSIONS 11/16/2017 Clinical Impression Pt with functional oropharyngeal swallow ability - No aspiration/penetration with all consistencies tested.  Pt with timely and strong swallow - he piecemeals consistently which is functional for him. Pt has known h/o severe reflux to hypopharynx and small hiatal hernia per imaging study  04/2016.  He did appear with esophageal backflow on MBS today WITHOUT sensation - advised him to said findings and recommended strict esophageal precautions.  SLP Visit Diagnosis Dysphagia, unspecified (R13.10) Attention and concentration deficit following -- Frontal lobe and executive function deficit following -- Impact on safety and function Mild aspiration risk   CHL IP TREATMENT RECOMMENDATION 11/15/2017 Treatment Recommendations Defer until completion of intrumental exam   Prognosis 11/16/2017 Prognosis for Safe Diet Advancement Good Barriers to Reach Goals -- Barriers/Prognosis Comment -- CHL IP DIET RECOMMENDATION 11/16/2017 SLP Diet Recommendations Regular solids;Thin liquid Liquid Administration via Cup;Straw Medication Administration Whole meds with liquid Compensations Slow rate;Small sips/bites Postural Changes Remain semi-upright after after feeds/meals (Comment);Seated upright at 90 degrees   CHL IP OTHER RECOMMENDATIONS 11/16/2017 Recommended Consults -- Oral Care Recommendations Oral care BID Other Recommendations --   CHL IP FOLLOW UP RECOMMENDATIONS 11/16/2017 Follow up Recommendations None   CHL IP FREQUENCY AND DURATION 11/16/2017 Speech Therapy Frequency (ACUTE ONLY) min 1 x/week Treatment Duration 1 week      CHL IP ORAL PHASE 11/16/2017 Oral Phase WFL Oral - Pudding Teaspoon -- Oral - Pudding Cup -- Oral - Honey Teaspoon -- Oral - Honey Cup -- Oral - Nectar Teaspoon -- Oral - Nectar Cup WFL;Piecemeal swallowing Oral - Nectar Straw -- Oral - Thin Teaspoon -- Oral - Thin Cup WFL;Piecemeal swallowing Oral - Thin Straw Piecemeal swallowing;WFL Oral - Puree Piecemeal swallowing;WFL Oral - Mech Soft -- Oral - Regular Piecemeal swallowing;WFL Oral - Multi-Consistency -- Oral - Pill Piecemeal swallowing;WFL Oral Phase - Comment --  CHL IP PHARYNGEAL PHASE 11/16/2017 Pharyngeal Phase WFL Pharyngeal- Pudding Teaspoon -- Pharyngeal -- Pharyngeal- Pudding Cup -- Pharyngeal -- Pharyngeal- Honey Teaspoon --  Pharyngeal -- Pharyngeal- Honey Cup -- Pharyngeal -- Pharyngeal- Nectar Teaspoon -- Pharyngeal -- Pharyngeal- Nectar Cup WFL Pharyngeal -- Pharyngeal- Nectar Straw -- Pharyngeal -- Pharyngeal- Thin Teaspoon -- Pharyngeal -- Pharyngeal- Thin Cup WFL Pharyngeal -- Pharyngeal- Thin Straw WFL Pharyngeal -- Pharyngeal- Puree WFL Pharyngeal -- Pharyngeal- Mechanical Soft -- Pharyngeal -- Pharyngeal- Regular (No Data) Pharyngeal -- Pharyngeal- Multi-consistency -- Pharyngeal -- Pharyngeal- Pill WFL Pharyngeal -- Pharyngeal Comment --  CHL IP CERVICAL ESOPHAGEAL PHASE 11/16/2017 Cervical Esophageal Phase Impaired Pudding Teaspoon -- Pudding Cup -- Honey Teaspoon -- Honey Cup -- Nectar Teaspoon -- Nectar Cup -- Nectar Straw -- Thin Teaspoon -- Thin Cup -- Thin Straw -- Puree -- Mechanical Soft -- Regular -- Multi-consistency -- Pill -- Cervical Esophageal Comment upon esophageal sweep, pt appeared with retrograde propulsion of barium to proximal esophagus without awareness, barium tablet readily cleared [place], MS [facility] SLP [phone]               Assessment and Plan  Weight gain with edema- patient's baseline creatinine is 2.2-2.5; patient is on Lasix 60 mg daily will increase Lasix to 80 mg daily for 4 days then back down to 60 mg daily with a BMP on 6/17     Greg D. [name], MD

## 2017-11-29 ENCOUNTER — Non-Acute Institutional Stay (SKILLED_NURSING_FACILITY): Payer: Medicare Other | Admitting: Internal Medicine

## 2017-11-29 ENCOUNTER — Encounter: Payer: Self-pay | Admitting: Internal Medicine

## 2017-11-29 DIAGNOSIS — R509 Fever, unspecified: Secondary | ICD-10-CM

## 2017-11-29 NOTE — Progress Notes (Signed)
[facility] Room Number: 507-P Place of Service:  SNF (31)  Greg Goldsmithnne D. Lyn HollingsheadAlexander, MD  Patient Care Team: Deeann SaintBanks, Shannon R, MD as PCP - General [facility](Family Medicine)  Extended Emergency Contact Information Primary Emergency Contact: Cathlyn ParsonsWaller,[name]  [place] Home Phone: [phone] Relation: Sister    Allergies: Other; Penicillins; and Vitamin b12  Chief Complaint  Patient presents with  . Acute Visit    Fever    HPI: Patient is 84 y.o. male who is being seen for a fever, one-time fever 102.2.  Patient's O2 sat is 97% on O2.  Patient admits he is no more short of breath than usual no more cough than usual no urinary symptoms no muscle aches no other symptoms of any kind.  Constipation and was treated for and still was loose after that but no other symptoms.  Patient is totally unaware that he has a fever.  Patient is a good historian.  Past Medical History:  Diagnosis Date  . CHF (congestive heart failure) (HCC)   . Chronic kidney disease   . COPD (chronic obstructive pulmonary disease) (HCC)   . GERD (gastroesophageal reflux disease)   . Hypertension   . Respiratory insufficiency     History reviewed. No pertinent surgical history.  Allergies as of 11/29/2017      Reactions   Other Other (See Comments)   Oxygen Sensitive   Penicillins Rash, Other (See Comments)   Any -illins as well as Mycins Has patient had a PCN reaction causing immediate rash, facial/tongue/throat swelling, SOB or lightheadedness with hypotension: Yes Has patient had a PCN reaction causing severe rash involving mucus membranes or skin necrosis: Yes Has patient had a PCN reaction that required hospitalization No Has patient had a PCN reaction occurring within the last 10 years: No If all of the above answers are "NO", then may proceed with Cephalosporin use.   Vitamin B12 Itching, Rash, Other (See Comments)   injections      Medication  List        Accurate as of 11/29/17  4:24 PM. Always use your most recent med list.          albuterol (2.5 MG/3ML) 0.083% nebulizer solution Commonly known as:  PROVENTIL Take 3 mLs (2.5 mg total) by nebulization every 6 (six) hours. DX:  J44.9   albuterol 108 (90 Base) MCG/ACT inhaler Commonly known as:  PROVENTIL HFA;VENTOLIN HFA Inhale 2 puffs into the lungs every 6 (six) hours as needed for wheezing or shortness of breath.   amLODipine 10 MG tablet Commonly known as:  NORVASC Take 10 mg by mouth daily.   aspirin EC 81 MG tablet Take 81 mg by mouth daily.   budesonide-formoterol 160-4.5 MCG/ACT inhaler Commonly known as:  SYMBICORT Inhale 2 puffs into the lungs 2 (two) times daily.   calcitRIOL 0.25 MCG capsule Commonly known as:  ROCALTROL Take 0.25 mcg by mouth every other day.   camphor-menthol lotion Commonly known as:  SARNA Apply 1 application topically as needed for itching.   clotrimazole 1 % cream Commonly known as:  LOTRIMIN Apply 1 application topically daily.   fluconazole 100 MG tablet Commonly known as:  DIFLUCAN Take 100 mg by mouth daily.   furosemide 80 MG tablet Commonly known as:  LASIX Take 80 mg by mouth daily.   hydrocerin Crea Apply 1 application topically 2 (two) times daily.   hydrOXYzine 25 MG tablet Commonly known as:  ATARAX/VISTARIL  Take 25 mg by mouth every 6 (six) hours. Itching   insulin aspart 100 UNIT/ML injection Commonly known as:  novoLOG Inject 2-15 Units into the skin 3 (three) times daily before meals. SQ: 121-150= 2 units, 151-200= 3 units, 201-250= 5 units, 251-300= 8 units, 301-350= 11 units, 351-400=15 units, Greater than 400=15 units and Call MD   insulin glargine 100 UNIT/ML injection Commonly known as:  LANTUS Inject 14 Units into the skin at bedtime.   isosorbide mononitrate 30 MG 24 hr tablet Commonly known as:  IMDUR Take 30 mg by mouth daily.   LORazepam 0.5 MG tablet Commonly known as:   ATIVAN Take 0.5 mg by mouth 2 (two) times daily.   omeprazole 20 MG capsule Commonly known as:  PRILOSEC Take 20 mg by mouth daily.   tiotropium 18 MCG inhalation capsule Commonly known as:  SPIRIVA Place 18 mcg into inhaler and inhale daily.   triamcinolone cream 0.1 % Commonly known as:  KENALOG Apply 1 application topically 2 (two) times daily.       No orders of the defined types were placed in this encounter.   Immunization History  Administered Date(s) Administered  . Influenza, High Dose Seasonal PF 05/06/2017  . Influenza,inj,Quad PF,6+ Mos 02/15/2016  . Influenza-Unspecified 05/03/2015  . Pneumococcal Conjugate-13 04/19/2016  . Pneumococcal Polysaccharide-23 09/18/2015    Social History   Tobacco Use  . Smoking status: Former Smoker    Packs/day: 0.50    Years: 70.00    Pack years: 35.00    Types: Cigarettes    Last attempt to quit: 04/19/2013    Years since quitting: 4.6  . Smokeless tobacco: Never Used  Substance Use Topics  . Alcohol use: No    Alcohol/week: 0.0 oz    Review of Systems  DATA OBTAINED: from patient, nurse, family member GENERAL: One-time fever of 102.2, no fatigue, no appetite changes SKIN: No itching, rash HEENT: No complaint RESPIRATORY: Occasional cough, wheezing, no more SOB CARDIAC: No chest pain, palpitations, lower extremity edema  GI: No abdominal pain, No N/V/D or constipation, No heartburn or reflux  GU: No dysuria, frequency or urgency, or incontinence  MUSCULOSKELETAL: No unrelieved bone/joint pain NEUROLOGIC: No headache, dizziness  PSYCHIATRIC: No overt anxiety or sadness  Vitals:   11/29/17 1622  BP: 126/75  Pulse: 68  Resp: 20  Temp: (!) 102.2 F (39 C)  SpO2: 97%   Body mass index is 27.06 kg/m. Physical Exam  GENERAL APPEARANCE: Alert, conversant, No acute distress  SKIN: No diaphoresis rash HEENT: Unremarkable RESPIRATORY: Breathing is even, unlabored. Lung sounds are clear   CARDIOVASCULAR:  Heart RRR no murmurs, rubs or gallops. No peripheral edema  GASTROINTESTINAL: Abdomen is soft, non-tender, not distended w/ normal bowel sounds.  GENITOURINARY: Bladder non tender, not distended  MUSCULOSKELETAL: No abnormal joints or musculature NEUROLOGIC: Cranial nerves 2-12 grossly intact. Moves all extremities PSYCHIATRIC: Mood and affect appropriate to situation, no behavioral issues  Patient Active Problem List   Diagnosis Date Noted  . Acute on chronic respiratory failure with hypoxemia (HCC) 11/22/2017  . HCAP (healthcare-associated pneumonia) 11/22/2017  . Aspiration pneumonia (HCC) 11/22/2017  . Macular rash 11/22/2017  . COPD with acute exacerbation (HCC) 10/18/2017  . Type 2 diabetes mellitus with other specified complication (HCC) 10/12/2017  . Pruritic rash 08/14/2017  . Pain and swelling of toe of left foot 08/14/2017  . Pressure injury of skin 04/12/2016  . CKD (chronic kidney disease), stage IV (HCC) 04/11/2016  . QT prolongation 04/11/2016  .  Hypokalemia 04/11/2016  . Chronic combined systolic and diastolic CHF (congestive heart failure) (HCC) 01/11/2016  . GERD (gastroesophageal reflux disease) 05/28/2015  . COPD (chronic obstructive pulmonary disease) (HCC) 05/10/2015  . Essential hypertension 05/10/2015    CMP     Component Value Date/Time   NA 140 11/28/2017   K 3.9 11/28/2017   CL 101 11/17/2017 0554   CO2 28 11/17/2017 0554   GLUCOSE 111 (H) 11/17/2017 0554   BUN 36 (A) 11/28/2017   CREATININE 2.0 (A) 11/28/2017   CREATININE 1.77 (H) 11/17/2017 0554   CALCIUM 9.4 11/17/2017 0554   PROT 6.8 11/12/2017 1612   ALBUMIN 3.2 (L) 11/12/2017 1612   AST 41 11/12/2017 1612   ALT 57 11/12/2017 1612   ALKPHOS 73 11/12/2017 1612   BILITOT 0.5 11/12/2017 1612   GFRNONAA 33 (L) 11/17/2017 0554   GFRAA 38 (L) 11/17/2017 0554   Recent Labs    10/18/17 1611  10/20/17 0517  11/12/17 1029  11/15/17 0547 11/16/17 0553 11/17/17 0554 11/22/17 11/28/17  NA  147*   < > 140   < > 143   < > 136 139 139 137 140  K 3.5   < > 4.0   < > 3.0*   < > 4.5 4.6 4.8 4.2 3.9  CL 103   < > 99*   < > 99*   < > 100* 100* 101  --   --   CO2 27   < > 26   < > 30   < > 26 32 28  --   --   GLUCOSE 131*   < > 178*   < > 146*   < > 159* 123* 111*  --   --   BUN 22*   < > 37*   < > 33*   < > 59* 57* 48* 44* 36*  CREATININE 2.47*   < > 2.30*   < > 1.98*   < > 2.40* 2.12* 1.77* 2.3* 2.0*  CALCIUM 9.9   < > 9.5   < > 9.2   < > 9.0 9.0 9.4  --   --   MG 2.2  --  2.6*  --  2.0  --   --   --   --   --   --    < > = values in this interval not displayed.   Recent Labs    08/21/17 0543 11/12/17 1029 11/12/17 1612  AST 34 37 41  ALT 67* 56 57  ALKPHOS 51 76 73  BILITOT 0.6 0.6 0.5  PROT 5.7* 7.2 6.8  ALBUMIN 2.8* 3.4* 3.2*   Recent Labs    11/15/17 0547 11/16/17 0553 11/17/17 0554 11/22/17  WBC 11.9* 12.6* 15.7* 12.4  NEUTROABS 9.8* 10.0 12.7*  --   HGB 11.9* 11.9* 12.0* 11.4*  HCT 37.4* 37.7* 37.5* 36*  MCV 84.0 84.3 83.1  --   PLT 246 245 266 238   No results for input(s): CHOL, LDLCALC, TRIG in the last 8760 hours.  Invalid input(s): HCL No results found for: MICROALBUR Lab Results  Component Value Date   TSH 0.689 11/13/2017   Lab Results  Component Value Date   HGBA1C 8.1 (H) 11/13/2017   No results found for: CHOL, HDL, LDLCALC, LDLDIRECT, TRIG, CHOLHDL  Significant Diagnostic Results in last 30 days:  Dg Chest 2 View  Result Date: 11/12/2017 CLINICAL DATA:  Short of breath with exertion. EXAM: CHEST - 2 VIEW COMPARISON:  10/21/2017 FINDINGS:  Cardiomegaly. Mild vascular congestion. No Kerley B lines. Mild patchy airspace disease in the right mid and lower lung zones. No pneumothorax or pleural effusion. IMPRESSION: Patchy airspace disease in the right mid and lower lung. Followup PA and lateral chest X-ray is recommended in 3-4 weeks following trial of antibiotic therapy to ensure resolution and exclude underlying malignancy. Cardiomegaly and  vascular congestion without pulmonary edema. Electronically Signed   By: Jolaine Click M.D.   On: 11/12/2017 10:42   Ct Chest Wo Contrast  Result Date: 11/14/2017 CLINICAL DATA:  COPD exacerbation. EXAM: CT CHEST WITHOUT CONTRAST TECHNIQUE: Multidetector CT imaging of the chest was performed following the standard protocol without IV contrast. COMPARISON:  Chest x-rays dated 11/12/2017 and chest CT dated 08/18/2017 FINDINGS: Cardiovascular: Aortic atherosclerosis. Extensive coronary artery calcification. Heart size is normal. No pericardial effusion. Mediastinum/Nodes: No enlarged mediastinal or axillary lymph nodes. Thyroid gland, trachea, and esophagus demonstrate no significant findings. Lungs/Pleura: Again noted is centrilobular and paraseptal emphysema. There is slight increased accentuation of the interstitial markings at the right lung base a small focal area of infiltrate posteriorly. Chronic but increased peribronchial thickening bilaterally with new thickening of the wall of several blebs in the posterior aspect of the right upper lobe. No effusions. Stable tiny nodule in the left lower lobe as previously described. Upper Abdomen: No acute abnormality. Focal dilatation of the abdominal aorta just below the level of the renal arteries to maximum diameter of 3.3 cm. Musculoskeletal: No acute abnormality. Osteophytes fuse much of the midthoracic spine. IMPRESSION: 1. New inflammatory changes in the right upper and lower lobes with a focal area of slight consolidation at the right lung base posteriorly superimposed on emphysema and accentuated chronic bronchitic changes. 2. Aortic Atherosclerosis (ICD10-I70.0) and Emphysema (ICD10-J43.9). Electronically Signed   By: Francene Boyers M.D.   On: 11/14/2017 17:03   Dg Chest Port 1 View  Result Date: 11/17/2017 CLINICAL DATA:  Dyspnea.  History of CHF and COPD.  Follow-up exam. EXAM: PORTABLE CHEST 1 VIEW COMPARISON:  11/12/2017. FINDINGS: Right mid and lower  lung opacities have improved from the prior exam. Remainder of the lungs are clear. Lungs are hyperexpanded. Cardiac silhouette is top-normal in size. No mediastinal or hilar masses. No pleural effusion.  No pneumothorax. IMPRESSION: 1. Improved airspace lung opacities in the right mid and lower lung consistent with improved pneumonia. 2. No new lung abnormalities. 3. COPD. Electronically Signed   By: Amie Portland M.D.   On: 11/17/2017 07:41   Dg Abd 2 Views  Result Date: 11/17/2017 CLINICAL DATA:  Abdominal distention and constipation. EXAM: ABDOMEN - 2 VIEW COMPARISON:  None. FINDINGS: The lung bases are normal. Contrast seen throughout the colon. No bowel obstruction. No free air, portal venous gas, or pneumatosis. IMPRESSION: Negative. Electronically Signed   By: Gerome Sam III M.D   On: 11/17/2017 12:29   Dg Swallowing Func-speech Pathology  Result Date: 11/16/2017 Objective Swallowing Evaluation: Type of Study: MBS-Modified Barium Swallow Study  Patient Details Name: Greg Jones MRN: [id number] Date of Birth: Apr 06, 1928 Today's Date: 11/16/2017 Time: SLP Start Time (ACUTE ONLY): 0825 -SLP Stop Time (ACUTE ONLY): 0845 SLP Time Calculation (min) (ACUTE ONLY): 20 min Past Medical History: Past Medical History: Diagnosis Date . CHF (congestive heart failure) (HCC)  . Chronic kidney disease  . COPD (chronic obstructive pulmonary disease) (HCC)  . GERD (gastroesophageal reflux disease)  . Hypertension  . Respiratory insufficiency  Past Surgical History: No past surgical history on file. HPI: 84 year old  male admitted 11/12/17. 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 Chest CT = inflammatory change in RUL/RLL, consolidation RLL  Subjective: Pt resting in recliner. RN reports pt tolerates regular/thin liquids. Got SOB going to bathroom today. Assessment / Plan / Recommendation CHL IP CLINICAL IMPRESSIONS 11/16/2017 Clinical Impression Pt  with functional oropharyngeal swallow ability - No aspiration/penetration with all consistencies tested.  Pt with timely and strong swallow - he piecemeals consistently which is functional for him. 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  He did appear with esophageal backflow on MBS today WITHOUT sensation - advised him to said findings and recommended strict esophageal precautions.  SLP Visit Diagnosis Dysphagia, unspecified (R13.10) Attention and concentration deficit following -- Frontal lobe and executive function deficit following -- Impact on safety and function Mild aspiration risk   CHL IP TREATMENT RECOMMENDATION 11/15/2017 Treatment Recommendations Defer until completion of intrumental exam   Prognosis 11/16/2017 Prognosis for Safe Diet Advancement Good Barriers to Reach Goals -- Barriers/Prognosis Comment -- CHL IP DIET RECOMMENDATION 11/16/2017 SLP Diet Recommendations Regular solids;Thin liquid Liquid Administration via Cup;Straw Medication Administration Whole meds with liquid Compensations Slow rate;Small sips/bites Postural Changes Remain semi-upright after after feeds/meals (Comment);Seated upright at 90 degrees   CHL IP OTHER RECOMMENDATIONS 11/16/2017 Recommended Consults -- Oral Care Recommendations Oral care BID Other Recommendations --   CHL IP FOLLOW UP RECOMMENDATIONS 11/16/2017 Follow up Recommendations None   CHL IP FREQUENCY AND DURATION 11/16/2017 Speech Therapy Frequency (ACUTE ONLY) min 1 x/week Treatment Duration 1 week      CHL IP ORAL PHASE 11/16/2017 Oral Phase WFL Oral - Pudding Teaspoon -- Oral - Pudding Cup -- Oral - Honey Teaspoon -- Oral - Honey Cup -- Oral - Nectar Teaspoon -- Oral - Nectar Cup WFL;Piecemeal swallowing Oral - Nectar Straw -- Oral - Thin Teaspoon -- Oral - Thin Cup WFL;Piecemeal swallowing Oral - Thin Straw Piecemeal swallowing;WFL Oral - Puree Piecemeal swallowing;WFL Oral - Mech Soft -- Oral - Regular  Piecemeal swallowing;WFL Oral - Multi-Consistency -- Oral - Pill Piecemeal swallowing;WFL Oral Phase - Comment --  CHL IP PHARYNGEAL PHASE 11/16/2017 Pharyngeal Phase WFL Pharyngeal- Pudding Teaspoon -- Pharyngeal -- Pharyngeal- Pudding Cup -- Pharyngeal -- Pharyngeal- Honey Teaspoon -- Pharyngeal -- Pharyngeal- Honey Cup -- Pharyngeal -- Pharyngeal- Nectar Teaspoon -- Pharyngeal -- Pharyngeal- Nectar Cup WFL Pharyngeal -- Pharyngeal- Nectar Straw -- Pharyngeal -- Pharyngeal- Thin Teaspoon -- Pharyngeal -- Pharyngeal- Thin Cup WFL Pharyngeal -- Pharyngeal- Thin Straw WFL Pharyngeal -- Pharyngeal- Puree WFL Pharyngeal -- Pharyngeal- Mechanical Soft -- Pharyngeal -- Pharyngeal- Regular (No Data) Pharyngeal -- Pharyngeal- Multi-consistency -- Pharyngeal -- Pharyngeal- Pill WFL Pharyngeal -- Pharyngeal Comment --  CHL IP CERVICAL ESOPHAGEAL PHASE 11/16/2017 Cervical Esophageal Phase Impaired Pudding Teaspoon -- Pudding Cup -- Honey Teaspoon -- Honey Cup -- Nectar Teaspoon -- Nectar Cup -- Nectar Straw -- Thin Teaspoon -- Thin Cup -- Thin Straw -- Puree -- Mechanical Soft -- Regular -- Multi-consistency -- Pill -- Cervical Esophageal Comment upon esophageal sweep, pt appeared with retrograde propulsion of barium to proximal esophagus without awareness, barium tablet readily cleared Mills Koller, MS [facility] SLP [phone]               Assessment and Plan  Fever- patient with fever does not appear sick, does not have any symptoms that point toward any system having a problem; have ordered a chest x-ray PA and lateral,  UA with C&S BMP CBC; no treatment is indicated now, will monitor for changes    Time spent greater than 35 minutes Kenric Ginger D. [name], MD

## 2017-12-03 ENCOUNTER — Encounter: Payer: Self-pay | Admitting: Internal Medicine

## 2017-12-03 ENCOUNTER — Non-Acute Institutional Stay (SKILLED_NURSING_FACILITY): Payer: Medicare Other | Admitting: Internal Medicine

## 2017-12-03 DIAGNOSIS — R509 Fever, unspecified: Secondary | ICD-10-CM

## 2017-12-03 NOTE — Progress Notes (Signed)
[facility] Room Number: [phone] Place of Service:  SNF [phone]  Randon Goldsmithnne D. Lyn HollingsheadAlexander, MD  Patient Care Team: Deeann SaintBanks, Shannon R, MD as PCP - General [facility](Family Medicine)  Extended Emergency Contact Information Primary Emergency Contact: Cathlyn ParsonsWaller,[name]  [place] Home Phone: [phone] Relation: Sister    Allergies: Other; Penicillins; and Vitamin b12  Chief Complaint  Patient presents with  . Acute Visit    low grade fever    HPI: Patient is 84 y.o. male who is being seen for a low-grade fever one time in the last 24 hours.  Patient complains of no cough shortness of breath or chest pain, no nausea no vomiting diarrhea, no fever no chills no muscle aches, patient does admit to some urgency and dysuria for the last 3 weeks.  Lung sounds are clear suprapubic tenderness.  Nursing reports no change in patient's status as to eating or activity  Past Medical History:  Diagnosis Date  . CHF (congestive heart failure) (HCC)   . Chronic kidney disease   . COPD (chronic obstructive pulmonary disease) (HCC)   . GERD (gastroesophageal reflux disease)   . Hypertension   . Respiratory insufficiency     History reviewed. No pertinent surgical history.  Allergies as of 12/03/2017      Reactions   Other Other (See Comments)   Oxygen Sensitive   Penicillins Rash, Other (See Comments)   Any -illins as well as Mycins Has patient had a PCN reaction causing immediate rash, facial/tongue/throat swelling, SOB or lightheadedness with hypotension: Yes Has patient had a PCN reaction causing severe rash involving mucus membranes or skin necrosis: Yes Has patient had a PCN reaction that required hospitalization No Has patient had a PCN reaction occurring within the last 10 years: No If all of the above answers are "NO", then may proceed with Cephalosporin use.   Vitamin B12 Itching, Rash, Other (See Comments)   injections      Medication List          Accurate as of 12/03/17  1:16 PM. Always use your most recent med list.          albuterol (2.5 MG/3ML) 0.083% nebulizer solution Commonly known as:  PROVENTIL Take 3 mLs (2.5 mg total) by nebulization every 6 (six) hours. DX:  J44.9   albuterol 108 (90 Base) MCG/ACT inhaler Commonly known as:  PROVENTIL HFA;VENTOLIN HFA Inhale 2 puffs into the lungs every 6 (six) hours as needed for wheezing or shortness of breath.   amLODipine 10 MG tablet Commonly known as:  NORVASC Take 10 mg by mouth daily.   aspirin EC 81 MG tablet Take 81 mg by mouth daily.   budesonide-formoterol 160-4.5 MCG/ACT inhaler Commonly known as:  SYMBICORT Inhale 2 puffs into the lungs 2 (two) times daily.   calcitRIOL 0.25 MCG capsule Commonly known as:  ROCALTROL Take 0.25 mcg by mouth every other day.   camphor-menthol lotion Commonly known as:  SARNA Apply 1 application topically as needed for itching.   clotrimazole 1 % cream Commonly known as:  LOTRIMIN Apply 1 application topically daily.   fluconazole 100 MG tablet Commonly known as:  DIFLUCAN Take 100 mg by mouth daily.   furosemide 20 MG tablet Commonly known as:  LASIX Take 60 mg by mouth daily.   hydrocerin Crea Apply 1 application topically 2 (two) times daily.   hydrOXYzine 25 MG tablet Commonly known as:  ATARAX/VISTARIL Take 25 mg by mouth every  6 (six) hours. Itching   insulin aspart 100 UNIT/ML injection Commonly known as:  novoLOG Inject 2-15 Units into the skin 3 (three) times daily before meals. SQ: 121-150= 2 units, 151-200= 3 units, 201-250= 5 units, 251-300= 8 units, 301-350= 11 units, 351-400=15 units, Greater than 400=15 units and Call MD   isosorbide mononitrate 30 MG 24 hr tablet Commonly known as:  IMDUR Take 30 mg by mouth daily.   LORazepam 0.5 MG tablet Commonly known as:  ATIVAN Take 0.5 mg by mouth 2 (two) times daily.   omeprazole 20 MG capsule Commonly known as:  PRILOSEC Take 20 mg by  mouth daily.   terbinafine 1 % cream Commonly known as:  LAMISIL Apply 1 application topically 2 (two) times daily. Apply to entire body   tiotropium 18 MCG inhalation capsule Commonly known as:  SPIRIVA Place 18 mcg into inhaler and inhale daily.   triamcinolone cream 0.1 % Commonly known as:  KENALOG Apply 1 application topically daily.       No orders of the defined types were placed in this encounter.   Immunization History  Administered Date(s) Administered  . Influenza, High Dose Seasonal PF 05/06/2017  . Influenza,inj,Quad PF,6+ Mos 02/15/2016  . Influenza-Unspecified 05/03/2015  . Pneumococcal Conjugate-13 04/19/2016  . Pneumococcal Polysaccharide-23 09/18/2015    Social History   Tobacco Use  . Smoking status: Former Smoker    Packs/day: 0.50    Years: 70.00    Pack years: 35.00    Types: Cigarettes    Last attempt to quit: 04/19/2013    Years since quitting: 4.6  . Smokeless tobacco: Never Used  Substance Use Topics  . Alcohol use: No    Alcohol/week: 0.0 oz    Review of Systems  DATA OBTAINED: from patient, nurse GENERAL:  no fevers, fatigue, appetite changes SKIN: No itching, rash HEENT: No complaint RESPIRATORY: No cough, wheezing, SOB CARDIAC: No chest pain, palpitations, lower extremity edema  GI: No abdominal pain, No N/V/D or constipation, No heartburn or reflux  GU: Some dysuria, some frequency or urgency, no incontinence  MUSCULOSKELETAL: No unrelieved bone/joint pain NEUROLOGIC: No headache, dizziness  PSYCHIATRIC: No overt anxiety or sadness  Vitals:   12/03/17 1243  BP: 118/70  Pulse: 87  Resp: (!) 22  Temp: 100.1 F (37.8 C)  SpO2: 95%   Body mass index is 27.29 kg/m. Physical Exam  GENERAL APPEARANCE: Alert, conversant, No acute distress  SKIN: No diaphoresis rash HEENT: Unremarkable RESPIRATORY: Breathing is even, unlabored. Lung sounds are clear, no wheezing no rails CARDIOVASCULAR: Heart RRR no murmurs, rubs or  gallops. No peripheral edema  GASTROINTESTINAL: Abdomen is soft, non-tender, not distended w/ normal bowel sounds.  GENITOURINARY: Bladder non tender, not distended  MUSCULOSKELETAL: No abnormal joints or musculature NEUROLOGIC: Cranial nerves 2-12 grossly intact. Moves all extremities PSYCHIATRIC: Mood and affect appropriate to situation, no behavioral issues  Patient Active Problem List   Diagnosis Date Noted  . Acute on chronic respiratory failure with hypoxemia (HCC) 11/22/2017  . HCAP (healthcare-associated pneumonia) 11/22/2017  . Aspiration pneumonia (HCC) 11/22/2017  . Macular rash 11/22/2017  . COPD with acute exacerbation (HCC) 10/18/2017  . Type 2 diabetes mellitus with other specified complication (HCC) 10/12/2017  . Pruritic rash 08/14/2017  . Pain and swelling of toe of left foot 08/14/2017  . Pressure injury of skin 04/12/2016  . CKD (chronic kidney disease), stage IV (HCC) 04/11/2016  . QT prolongation 04/11/2016  . Hypokalemia 04/11/2016  . Chronic combined systolic and  diastolic CHF (congestive heart failure) (HCC) 01/11/2016  . GERD (gastroesophageal reflux disease) 05/28/2015  . COPD (chronic obstructive pulmonary disease) (HCC) 05/10/2015  . Essential hypertension 05/10/2015    CMP     Component Value Date/Time   NA 140 11/28/2017   K 3.9 11/28/2017   CL 101 11/17/2017 0554   CO2 28 11/17/2017 0554   GLUCOSE 111 (H) 11/17/2017 0554   BUN 36 (A) 11/28/2017   CREATININE 2.0 (A) 11/28/2017   CREATININE 1.77 (H) 11/17/2017 0554   CALCIUM 9.4 11/17/2017 0554   PROT 6.8 11/12/2017 1612   ALBUMIN 3.2 (L) 11/12/2017 1612   AST 41 11/12/2017 1612   ALT 57 11/12/2017 1612   ALKPHOS 73 11/12/2017 1612   BILITOT 0.5 11/12/2017 1612   GFRNONAA 33 (L) 11/17/2017 0554   GFRAA 38 (L) 11/17/2017 0554   Recent Labs    10/18/17 1611  10/20/17 0517  11/12/17 1029  11/15/17 0547 11/16/17 0553 11/17/17 0554 11/22/17 11/28/17  NA 147*   < > 140   < > 143   < >  136 139 139 137 140  K 3.5   < > 4.0   < > 3.0*   < > 4.5 4.6 4.8 4.2 3.9  CL 103   < > 99*   < > 99*   < > 100* 100* 101  --   --   CO2 27   < > 26   < > 30   < > 26 32 28  --   --   GLUCOSE 131*   < > 178*   < > 146*   < > 159* 123* 111*  --   --   BUN 22*   < > 37*   < > 33*   < > 59* 57* 48* 44* 36*  CREATININE 2.47*   < > 2.30*   < > 1.98*   < > 2.40* 2.12* 1.77* 2.3* 2.0*  CALCIUM 9.9   < > 9.5   < > 9.2   < > 9.0 9.0 9.4  --   --   MG 2.2  --  2.6*  --  2.0  --   --   --   --   --   --    < > = values in this interval not displayed.   Recent Labs    08/21/17 0543 11/12/17 1029 11/12/17 1612  AST 34 37 41  ALT 67* 56 57  ALKPHOS 51 76 73  BILITOT 0.6 0.6 0.5  PROT 5.7* 7.2 6.8  ALBUMIN 2.8* 3.4* 3.2*   Recent Labs    11/15/17 0547 11/16/17 0553 11/17/17 0554 11/22/17  WBC 11.9* 12.6* 15.7* 12.4  NEUTROABS 9.8* 10.0 12.7*  --   HGB 11.9* 11.9* 12.0* 11.4*  HCT 37.4* 37.7* 37.5* 36*  MCV 84.0 84.3 83.1  --   PLT 246 245 266 238   No results for input(s): CHOL, LDLCALC, TRIG in the last 8760 hours.  Invalid input(s): HCL No results found for: MICROALBUR Lab Results  Component Value Date   TSH 0.689 11/13/2017   Lab Results  Component Value Date   HGBA1C 8.1 (H) 11/13/2017   No results found for: CHOL, HDL, LDLCALC, LDLDIRECT, TRIG, CHOLHDL  Significant Diagnostic Results in last 30 days:  Dg Chest 2 View  Result Date: 11/12/2017 CLINICAL DATA:  Short of breath with exertion. EXAM: CHEST - 2 VIEW COMPARISON:  10/21/2017 FINDINGS: Cardiomegaly. Mild vascular congestion. No Kerley B lines.  Mild patchy airspace disease in the right mid and lower lung zones. No pneumothorax or pleural effusion. IMPRESSION: Patchy airspace disease in the right mid and lower lung. Followup PA and lateral chest X-ray is recommended in 3-4 weeks following trial of antibiotic therapy to ensure resolution and exclude underlying malignancy. Cardiomegaly and vascular congestion without  pulmonary edema. Electronically Signed   By: Jolaine Click M.D.   On: 11/12/2017 10:42   Ct Chest Wo Contrast  Result Date: 11/14/2017 CLINICAL DATA:  COPD exacerbation. EXAM: CT CHEST WITHOUT CONTRAST TECHNIQUE: Multidetector CT imaging of the chest was performed following the standard protocol without IV contrast. COMPARISON:  Chest x-rays dated 11/12/2017 and chest CT dated 08/18/2017 FINDINGS: Cardiovascular: Aortic atherosclerosis. Extensive coronary artery calcification. Heart size is normal. No pericardial effusion. Mediastinum/Nodes: No enlarged mediastinal or axillary lymph nodes. Thyroid gland, trachea, and esophagus demonstrate no significant findings. Lungs/Pleura: Again noted is centrilobular and paraseptal emphysema. There is slight increased accentuation of the interstitial markings at the right lung base a small focal area of infiltrate posteriorly. Chronic but increased peribronchial thickening bilaterally with new thickening of the wall of several blebs in the posterior aspect of the right upper lobe. No effusions. Stable tiny nodule in the left lower lobe as previously described. Upper Abdomen: No acute abnormality. Focal dilatation of the abdominal aorta just below the level of the renal arteries to maximum diameter of 3.3 cm. Musculoskeletal: No acute abnormality. Osteophytes fuse much of the midthoracic spine. IMPRESSION: 1. New inflammatory changes in the right upper and lower lobes with a focal area of slight consolidation at the right lung base posteriorly superimposed on emphysema and accentuated chronic bronchitic changes. 2. Aortic Atherosclerosis (ICD10-I70.0) and Emphysema (ICD10-J43.9). Electronically Signed   By: Francene Boyers M.D.   On: 11/14/2017 17:03   Dg Chest Port 1 View  Result Date: 11/17/2017 CLINICAL DATA:  Dyspnea.  History of CHF and COPD.  Follow-up exam. EXAM: PORTABLE CHEST 1 VIEW COMPARISON:  11/12/2017. FINDINGS: Right mid and lower lung opacities have improved  from the prior exam. Remainder of the lungs are clear. Lungs are hyperexpanded. Cardiac silhouette is top-normal in size. No mediastinal or hilar masses. No pleural effusion.  No pneumothorax. IMPRESSION: 1. Improved airspace lung opacities in the right mid and lower lung consistent with improved pneumonia. 2. No new lung abnormalities. 3. COPD. Electronically Signed   By: Amie Portland M.D.   On: 11/17/2017 07:41   Dg Abd 2 Views  Result Date: 11/17/2017 CLINICAL DATA:  Abdominal distention and constipation. EXAM: ABDOMEN - 2 VIEW COMPARISON:  None. FINDINGS: The lung bases are normal. Contrast seen throughout the colon. No bowel obstruction. No free air, portal venous gas, or pneumatosis. IMPRESSION: Negative. Electronically Signed   By: Gerome Sam III M.D   On: 11/17/2017 12:29   Dg Swallowing Func-speech Pathology  Result Date: 11/16/2017 Objective Swallowing Evaluation: Type of Study: MBS-Modified Barium Swallow Study  Patient Details Name: Vito Beg MRN: [id number] Date of Birth: Dec 04, 1927 Today's Date: 11/16/2017 Time: SLP Start Time (ACUTE ONLY): 0825 -SLP Stop Time (ACUTE ONLY): 0845 SLP Time Calculation (min) (ACUTE ONLY): 20 min Past Medical History: Past Medical History: Diagnosis Date . CHF (congestive heart failure) (HCC)  . Chronic kidney disease  . COPD (chronic obstructive pulmonary disease) (HCC)  . GERD (gastroesophageal reflux disease)  . Hypertension  . Respiratory insufficiency  Past Surgical History: No past surgical history on file. HPI: 84 year old male admitted 11/12/17. 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 Chest CT = inflammatory change in RUL/RLL, consolidation RLL  Subjective: Pt resting in recliner. RN reports pt tolerates regular/thin liquids. Got SOB going to bathroom today. Assessment / Plan / Recommendation CHL IP CLINICAL IMPRESSIONS 11/16/2017 Clinical Impression Pt with functional  oropharyngeal swallow ability - No aspiration/penetration with all consistencies tested.  Pt with timely and strong swallow - he piecemeals consistently which is functional for him. 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  He did appear with esophageal backflow on MBS today WITHOUT sensation - advised him to said findings and recommended strict esophageal precautions.  SLP Visit Diagnosis Dysphagia, unspecified (R13.10) Attention and concentration deficit following -- Frontal lobe and executive function deficit following -- Impact on safety and function Mild aspiration risk   CHL IP TREATMENT RECOMMENDATION 11/15/2017 Treatment Recommendations Defer until completion of intrumental exam   Prognosis 11/16/2017 Prognosis for Safe Diet Advancement Good Barriers to Reach Goals -- Barriers/Prognosis Comment -- CHL IP DIET RECOMMENDATION 11/16/2017 SLP Diet Recommendations Regular solids;Thin liquid Liquid Administration via Cup;Straw Medication Administration Whole meds with liquid Compensations Slow rate;Small sips/bites Postural Changes Remain semi-upright after after feeds/meals (Comment);Seated upright at 90 degrees   CHL IP OTHER RECOMMENDATIONS 11/16/2017 Recommended Consults -- Oral Care Recommendations Oral care BID Other Recommendations --   CHL IP FOLLOW UP RECOMMENDATIONS 11/16/2017 Follow up Recommendations None   CHL IP FREQUENCY AND DURATION 11/16/2017 Speech Therapy Frequency (ACUTE ONLY) min 1 x/week Treatment Duration 1 week      CHL IP ORAL PHASE 11/16/2017 Oral Phase WFL Oral - Pudding Teaspoon -- Oral - Pudding Cup -- Oral - Honey Teaspoon -- Oral - Honey Cup -- Oral - Nectar Teaspoon -- Oral - Nectar Cup WFL;Piecemeal swallowing Oral - Nectar Straw -- Oral - Thin Teaspoon -- Oral - Thin Cup WFL;Piecemeal swallowing Oral - Thin Straw Piecemeal swallowing;WFL Oral - Puree Piecemeal swallowing;WFL Oral - Mech Soft -- Oral - Regular Piecemeal  swallowing;WFL Oral - Multi-Consistency -- Oral - Pill Piecemeal swallowing;WFL Oral Phase - Comment --  CHL IP PHARYNGEAL PHASE 11/16/2017 Pharyngeal Phase WFL Pharyngeal- Pudding Teaspoon -- Pharyngeal -- Pharyngeal- Pudding Cup -- Pharyngeal -- Pharyngeal- Honey Teaspoon -- Pharyngeal -- Pharyngeal- Honey Cup -- Pharyngeal -- Pharyngeal- Nectar Teaspoon -- Pharyngeal -- Pharyngeal- Nectar Cup WFL Pharyngeal -- Pharyngeal- Nectar Straw -- Pharyngeal -- Pharyngeal- Thin Teaspoon -- Pharyngeal -- Pharyngeal- Thin Cup WFL Pharyngeal -- Pharyngeal- Thin Straw WFL Pharyngeal -- Pharyngeal- Puree WFL Pharyngeal -- Pharyngeal- Mechanical Soft -- Pharyngeal -- Pharyngeal- Regular (No Data) Pharyngeal -- Pharyngeal- Multi-consistency -- Pharyngeal -- Pharyngeal- Pill WFL Pharyngeal -- Pharyngeal Comment --  CHL IP CERVICAL ESOPHAGEAL PHASE 11/16/2017 Cervical Esophageal Phase Impaired Pudding Teaspoon -- Pudding Cup -- Honey Teaspoon -- Honey Cup -- Nectar Teaspoon -- Nectar Cup -- Nectar Straw -- Thin Teaspoon -- Thin Cup -- Thin Straw -- Puree -- Mechanical Soft -- Regular -- Multi-consistency -- Pill -- Cervical Esophageal Comment upon esophageal sweep, pt appeared with retrograde propulsion of barium to proximal esophagus without awareness, barium tablet readily cleared [place] Koller, MS [facility] SLP [phone]               Assessment and Plan  Low-grade fever- does not appear to be respiratory, no symptoms in physical exam does not support; will order urine with C&S; continue to monitor    Zahriah Roes D. [name],  MD

## 2017-12-05 LAB — BASIC METABOLIC PANEL
BUN: 17 (ref 4–21)
CALCIUM: 9.1
CREATININE: 2.14
EGFR (African American): 30.83
Glucose: 142
POTASSIUM: 3.8
SODIUM: 140

## 2017-12-06 ENCOUNTER — Encounter: Payer: Self-pay | Admitting: Internal Medicine

## 2017-12-06 NOTE — Progress Notes (Signed)
Location:      Place of Service:     Deeann Saint, MD  Patient Care Team: Deeann Saint, MD as PCP - General (Family Medicine)  Extended Emergency Contact Information Primary Emergency Contact: Greg Jones [place] of [place] Home Phone: [phone] Relation: Sister    Allergies: Other; Penicillins; and Vitamin b12  No chief complaint on file.   HPI: Patient is 84 y.o. male who   Past Medical History:  Diagnosis Date  . CHF (congestive heart failure) (HCC)   . Chronic kidney disease   . COPD (chronic obstructive pulmonary disease) (HCC)   . GERD (gastroesophageal reflux disease)   . Hypertension   . Respiratory insufficiency     No past surgical history on file.  Allergies as of 11/06/2017      Reactions   Other Other (See Comments)   Oxygen Sensitive   Penicillins Rash, Other (See Comments)   Any -illins as well as Mycins Has patient had a PCN reaction causing immediate rash, facial/tongue/throat swelling, SOB or lightheadedness with hypotension: Yes Has patient had a PCN reaction causing severe rash involving mucus membranes or skin necrosis: Yes Has patient had a PCN reaction that required hospitalization No Has patient had a PCN reaction occurring within the last 10 years: No If all of the above answers are "NO", then may proceed with Cephalosporin use.   Vitamin B12 Itching, Rash, Other (See Comments)   injections      Medication List        Accurate as of 11/06/17 11:59 PM. Always use your most recent med list.          albuterol (2.5 MG/3ML) 0.083% nebulizer solution Commonly known as:  PROVENTIL Take 3 mLs (2.5 mg total) by nebulization every 6 (six) hours. DX:  J44.9   albuterol 108 (90 Base) MCG/ACT inhaler Commonly known as:  PROVENTIL HFA;VENTOLIN HFA Inhale 2 puffs into the lungs every 6 (six) hours as needed for wheezing or shortness of breath.   amLODipine 5 MG tablet Commonly known as:  NORVASC Take 1 tablet (5  mg total) by mouth daily.   aspirin EC 81 MG tablet Take 81 mg by mouth daily.   budesonide-formoterol 160-4.5 MCG/ACT inhaler Commonly known as:  SYMBICORT Inhale 2 puffs into the lungs 2 (two) times daily.   calcitRIOL 0.25 MCG capsule Commonly known as:  ROCALTROL Take 0.25 mcg by mouth every other day.   camphor-menthol lotion Commonly known as:  SARNA Apply 1 application topically as needed for itching.   furosemide 80 MG tablet Commonly known as:  LASIX Take 80 mg by mouth daily.   hydrOXYzine 25 MG tablet Commonly known as:  ATARAX/VISTARIL Take 25 mg by mouth every 6 (six) hours. Itching   insulin glargine 100 UNIT/ML injection Commonly known as:  LANTUS Inject 14 Units into the skin at bedtime.   isosorbide mononitrate 30 MG 24 hr tablet Commonly known as:  IMDUR Take 30 mg by mouth daily.   omeprazole 20 MG capsule Commonly known as:  PRILOSEC Take 20 mg by mouth daily.   predniSONE 10 MG (21) Tbpk tablet Commonly known as:  STERAPRED UNI-PAK 21 TAB Take 40 mg po daily for the next 3 days, then take 20 mg po daily for the next 3 days, then take 10 mg po daily   terbinafine 1 % cream Commonly known as:  LAMISIL AT Apply 1 application topically 2 (two) times daily.   tiotropium 18 MCG inhalation capsule Commonly  known as:  SPIRIVA Place 18 mcg into inhaler and inhale daily.   triamcinolone cream 0.1 % Commonly known as:  KENALOG Apply 1 application topically daily.       No orders of the defined types were placed in this encounter.   Immunization History  Administered Date(s) Administered  . Influenza, High Dose Seasonal PF 05/06/2017  . Influenza,inj,Quad PF,6+ Mos 02/15/2016  . Influenza-Unspecified 05/03/2015  . Pneumococcal Conjugate-13 04/19/2016  . Pneumococcal Polysaccharide-23 09/18/2015    Social History   Tobacco Use  . Smoking status: Former Smoker    Packs/day: 0.50    Years: 70.00    Pack years: 35.00    Types: Cigarettes     Last attempt to quit: 04/19/2013    Years since quitting: 4.6  . Smokeless tobacco: Never Used  Substance Use Topics  . Alcohol use: No    Alcohol/week: 0.0 oz    Review of Systems  DATA OBTAINED: from patient, nurse, medical record, family member GENERAL:  no fevers, fatigue, appetite changes SKIN: No itching, rash HEENT: No complaint RESPIRATORY: No cough, wheezing, SOB CARDIAC: No chest pain, palpitations, lower extremity edema  GI: No abdominal pain, No N/V/D or constipation, No heartburn or reflux  GU: No dysuria, frequency or urgency, or incontinence  MUSCULOSKELETAL: No unrelieved bone/joint pain NEUROLOGIC: No headache, dizziness  PSYCHIATRIC: No overt anxiety or sadness  There were no vitals filed for this visit. There is no height or weight on file to calculate BMI. Physical Exam  GENERAL APPEARANCE: Alert, conversant, No acute distress  SKIN: No diaphoresis rash HEENT: Unremarkable RESPIRATORY: Breathing is even, unlabored. Lung sounds are clear   CARDIOVASCULAR: Heart RRR no murmurs, rubs or gallops. No peripheral edema  GASTROINTESTINAL: Abdomen is soft, non-tender, not distended w/ normal bowel sounds.  GENITOURINARY: Bladder non tender, not distended  MUSCULOSKELETAL: No abnormal joints or musculature NEUROLOGIC: Cranial nerves 2-12 grossly intact. Moves all extremities PSYCHIATRIC: Mood and affect appropriate to situation, no behavioral issues  Patient Active Problem List   Diagnosis Date Noted  . Acute on chronic respiratory failure with hypoxemia (HCC) 11/22/2017  . HCAP (healthcare-associated pneumonia) 11/22/2017  . Aspiration pneumonia (HCC) 11/22/2017  . Macular rash 11/22/2017  . COPD with acute exacerbation (HCC) 10/18/2017  . Type 2 diabetes mellitus with other specified complication (HCC) 10/12/2017  . Pruritic rash 08/14/2017  . Pain and swelling of toe of left foot 08/14/2017  . Pressure injury of skin 04/12/2016  . CKD (chronic kidney  disease), stage IV (HCC) 04/11/2016  . QT prolongation 04/11/2016  . Hypokalemia 04/11/2016  . Chronic combined systolic and diastolic CHF (congestive heart failure) (HCC) 01/11/2016  . GERD (gastroesophageal reflux disease) 05/28/2015  . COPD (chronic obstructive pulmonary disease) (HCC) 05/10/2015  . Essential hypertension 05/10/2015    CMP     Component Value Date/Time   NA 140 11/28/2017   K 3.9 11/28/2017   CL 101 11/17/2017 0554   CO2 28 11/17/2017 0554   GLUCOSE 111 (H) 11/17/2017 0554   BUN 36 (A) 11/28/2017   CREATININE 2.0 (A) 11/28/2017   CREATININE 1.77 (H) 11/17/2017 0554   CALCIUM 9.4 11/17/2017 0554   PROT 6.8 11/12/2017 1612   ALBUMIN 3.2 (L) 11/12/2017 1612   AST 41 11/12/2017 1612   ALT 57 11/12/2017 1612   ALKPHOS 73 11/12/2017 1612   BILITOT 0.5 11/12/2017 1612   GFRNONAA 33 (L) 11/17/2017 0554   GFRAA 38 (L) 11/17/2017 0554   Recent Labs    10/18/17 1611  10/20/17 0517  11/12/17 1029  11/15/17 0547 11/16/17 0553 11/17/17 0554 11/22/17 11/28/17  NA 147*   < > 140   < > 143   < > 136 139 139 137 140  K 3.5   < > 4.0   < > 3.0*   < > 4.5 4.6 4.8 4.2 3.9  CL 103   < > 99*   < > 99*   < > 100* 100* 101  --   --   CO2 27   < > 26   < > 30   < > 26 32 28  --   --   GLUCOSE 131*   < > 178*   < > 146*   < > 159* 123* 111*  --   --   BUN 22*   < > 37*   < > 33*   < > 59* 57* 48* 44* 36*  CREATININE 2.47*   < > 2.30*   < > 1.98*   < > 2.40* 2.12* 1.77* 2.3* 2.0*  CALCIUM 9.9   < > 9.5   < > 9.2   < > 9.0 9.0 9.4  --   --   MG 2.2  --  2.6*  --  2.0  --   --   --   --   --   --    < > = values in this interval not displayed.   Recent Labs    08/21/17 0543 11/12/17 1029 11/12/17 1612  AST 34 37 41  ALT 67* 56 57  ALKPHOS 51 76 73  BILITOT 0.6 0.6 0.5  PROT 5.7* 7.2 6.8  ALBUMIN 2.8* 3.4* 3.2*   Recent Labs    11/15/17 0547 11/16/17 0553 11/17/17 0554 11/22/17  WBC 11.9* 12.6* 15.7* 12.4  NEUTROABS 9.8* 10.0 12.7*  --   HGB 11.9* 11.9* 12.0*  11.4*  HCT 37.4* 37.7* 37.5* 36*  MCV 84.0 84.3 83.1  --   PLT 246 245 266 238   No results for input(s): CHOL, LDLCALC, TRIG in the last 8760 hours.  Invalid input(s): HCL No results found for: MICROALBUR Lab Results  Component Value Date   TSH 0.689 11/13/2017   Lab Results  Component Value Date   HGBA1C 8.1 (H) 11/13/2017   No results found for: CHOL, HDL, LDLCALC, LDLDIRECT, TRIG, CHOLHDL  Significant Diagnostic Results in last 30 days:  Dg Chest 2 View  Result Date: 11/12/2017 CLINICAL DATA:  Short of breath with exertion. EXAM: CHEST - 2 VIEW COMPARISON:  10/21/2017 FINDINGS: Cardiomegaly. Mild vascular congestion. No Kerley B lines. Mild patchy airspace disease in the right mid and lower lung zones. No pneumothorax or pleural effusion. IMPRESSION: Patchy airspace disease in the right mid and lower lung. Followup PA and lateral chest X-ray is recommended in 3-4 weeks following trial of antibiotic therapy to ensure resolution and exclude underlying malignancy. Cardiomegaly and vascular congestion without pulmonary edema. Electronically Signed   By: Jolaine ClickArthur  Hoss M.D.   On: 11/12/2017 10:42   Ct Chest Wo Contrast  Result Date: 11/14/2017 CLINICAL DATA:  COPD exacerbation. EXAM: CT CHEST WITHOUT CONTRAST TECHNIQUE: Multidetector CT imaging of the chest was performed following the standard protocol without IV contrast. COMPARISON:  Chest x-rays dated 11/12/2017 and chest CT dated 08/18/2017 FINDINGS: Cardiovascular: Aortic atherosclerosis. Extensive coronary artery calcification. Heart size is normal. No pericardial effusion. Mediastinum/Nodes: No enlarged mediastinal or axillary lymph nodes. Thyroid gland, trachea, and esophagus demonstrate no significant findings. Lungs/Pleura: Again noted  is centrilobular and paraseptal emphysema. There is slight increased accentuation of the interstitial markings at the right lung base a small focal area of infiltrate posteriorly. Chronic but increased  peribronchial thickening bilaterally with new thickening of the wall of several blebs in the posterior aspect of the right upper lobe. No effusions. Stable tiny nodule in the left lower lobe as previously described. Upper Abdomen: No acute abnormality. Focal dilatation of the abdominal aorta just below the level of the renal arteries to maximum diameter of 3.3 cm. Musculoskeletal: No acute abnormality. Osteophytes fuse much of the midthoracic spine. IMPRESSION: 1. New inflammatory changes in the right upper and lower lobes with a focal area of slight consolidation at the right lung base posteriorly superimposed on emphysema and accentuated chronic bronchitic changes. 2. Aortic Atherosclerosis (ICD10-I70.0) and Emphysema (ICD10-J43.9). Electronically Signed   By: Francene Boyers M.D.   On: 11/14/2017 17:03   Dg Chest Port 1 View  Result Date: 11/17/2017 CLINICAL DATA:  Dyspnea.  History of CHF and COPD.  Follow-up exam. EXAM: PORTABLE CHEST 1 VIEW COMPARISON:  11/12/2017. FINDINGS: Right mid and lower lung opacities have improved from the prior exam. Remainder of the lungs are clear. Lungs are hyperexpanded. Cardiac silhouette is top-normal in size. No mediastinal or hilar masses. No pleural effusion.  No pneumothorax. IMPRESSION: 1. Improved airspace lung opacities in the right mid and lower lung consistent with improved pneumonia. 2. No new lung abnormalities. 3. COPD. Electronically Signed   By: Amie Portland M.D.   On: 11/17/2017 07:41   Dg Abd 2 Views  Result Date: 11/17/2017 CLINICAL DATA:  Abdominal distention and constipation. EXAM: ABDOMEN - 2 VIEW COMPARISON:  None. FINDINGS: The lung bases are normal. Contrast seen throughout the colon. No bowel obstruction. No free air, portal venous gas, or pneumatosis. IMPRESSION: Negative. Electronically Signed   By: Gerome Sam III M.D   On: 11/17/2017 12:29   Dg Swallowing Func-speech Pathology  Result Date: 11/16/2017 Objective Swallowing Evaluation:  Type of Study: MBS-Modified Barium Swallow Study  Patient Details Name: Greg Jones MRN: [id number] Date of Birth: 1927/10/14 Today's Date: 11/16/2017 Time: SLP Start Time (ACUTE ONLY): 0825 -SLP Stop Time (ACUTE ONLY): 0845 SLP Time Calculation (min) (ACUTE ONLY): 20 min Past Medical History: Past Medical History: Diagnosis Date . CHF (congestive heart failure) (HCC)  . Chronic kidney disease  . COPD (chronic obstructive pulmonary disease) (HCC)  . GERD (gastroesophageal reflux disease)  . Hypertension  . Respiratory insufficiency  Past Surgical History: No past surgical history on file. HPI: 84 year old male admitted 11/12/17. 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 Chest CT = inflammatory change in RUL/RLL, consolidation RLL  Subjective: Pt resting in recliner. RN reports pt tolerates regular/thin liquids. Got SOB going to bathroom today. Assessment / Plan / Recommendation CHL IP CLINICAL IMPRESSIONS 11/16/2017 Clinical Impression Pt with functional oropharyngeal swallow ability - No aspiration/penetration with all consistencies tested.  Pt with timely and strong swallow - he piecemeals consistently which is functional for him. 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  He did appear with esophageal backflow on MBS today WITHOUT sensation - advised him to said findings and recommended strict esophageal precautions.  SLP Visit Diagnosis Dysphagia, unspecified (R13.10) Attention and concentration deficit following -- Frontal lobe and executive function deficit following -- Impact on safety and function Mild aspiration risk   CHL IP TREATMENT RECOMMENDATION 11/15/2017 Treatment Recommendations  Defer until completion of intrumental exam   Prognosis 11/16/2017 Prognosis for Safe Diet Advancement Good Barriers to Reach Goals -- Barriers/Prognosis Comment -- CHL IP DIET RECOMMENDATION 11/16/2017 SLP  Diet Recommendations Regular solids;Thin liquid Liquid Administration via Cup;Straw Medication Administration Whole meds with liquid Compensations Slow rate;Small sips/bites Postural Changes Remain semi-upright after after feeds/meals (Comment);Seated upright at 90 degrees   CHL IP OTHER RECOMMENDATIONS 11/16/2017 Recommended Consults -- Oral Care Recommendations Oral care BID Other Recommendations --   CHL IP FOLLOW UP RECOMMENDATIONS 11/16/2017 Follow up Recommendations None   CHL IP FREQUENCY AND DURATION 11/16/2017 Speech Therapy Frequency (ACUTE ONLY) min 1 x/week Treatment Duration 1 week      CHL IP ORAL PHASE 11/16/2017 Oral Phase WFL Oral - Pudding Teaspoon -- Oral - Pudding Cup -- Oral - Honey Teaspoon -- Oral - Honey Cup -- Oral - Nectar Teaspoon -- Oral - Nectar Cup WFL;Piecemeal swallowing Oral - Nectar Straw -- Oral - Thin Teaspoon -- Oral - Thin Cup WFL;Piecemeal swallowing Oral - Thin Straw Piecemeal swallowing;WFL Oral - Puree Piecemeal swallowing;WFL Oral - Mech Soft -- Oral - Regular Piecemeal swallowing;WFL Oral - Multi-Consistency -- Oral - Pill Piecemeal swallowing;WFL Oral Phase - Comment --  CHL IP PHARYNGEAL PHASE 11/16/2017 Pharyngeal Phase WFL Pharyngeal- Pudding Teaspoon -- Pharyngeal -- Pharyngeal- Pudding Cup -- Pharyngeal -- Pharyngeal- Honey Teaspoon -- Pharyngeal -- Pharyngeal- Honey Cup -- Pharyngeal -- Pharyngeal- Nectar Teaspoon -- Pharyngeal -- Pharyngeal- Nectar Cup WFL Pharyngeal -- Pharyngeal- Nectar Straw -- Pharyngeal -- Pharyngeal- Thin Teaspoon -- Pharyngeal -- Pharyngeal- Thin Cup WFL Pharyngeal -- Pharyngeal- Thin Straw WFL Pharyngeal -- Pharyngeal- Puree WFL Pharyngeal -- Pharyngeal- Mechanical Soft -- Pharyngeal -- Pharyngeal- Regular (No Data) Pharyngeal -- Pharyngeal- Multi-consistency -- Pharyngeal -- Pharyngeal- Pill WFL Pharyngeal -- Pharyngeal Comment --  CHL IP CERVICAL ESOPHAGEAL PHASE 11/16/2017 Cervical Esophageal Phase Impaired Pudding Teaspoon -- Pudding Cup --  Honey Teaspoon -- Honey Cup -- Nectar Teaspoon -- Nectar Cup -- Nectar Straw -- Thin Teaspoon -- Thin Cup -- Thin Straw -- Puree -- Mechanical Soft -- Regular -- Multi-consistency -- Pill -- Cervical Esophageal Comment upon esophageal sweep, pt appeared with retrograde propulsion of barium to proximal esophagus without awareness, barium tablet readily cleared Mills Koller, MS [facility] SLP [phone]               Assessment and Plan  No problem-specific Assessment & Plan notes found for this encounter.   Labs/tests ordered:    Merrilee Seashore, MD   This encounter was created in error - please disregard.

## 2017-12-07 ENCOUNTER — Encounter: Payer: Self-pay | Admitting: *Deleted

## 2017-12-08 ENCOUNTER — Encounter: Payer: Self-pay | Admitting: Internal Medicine

## 2017-12-10 ENCOUNTER — Encounter: Payer: Self-pay | Admitting: *Deleted

## 2017-12-10 LAB — CBC
HCT: 35.2
HEMOGLOBIN: 11.2
MCV: 81.2
WBC: 7.9
platelet count: 345

## 2017-12-19 ENCOUNTER — Non-Acute Institutional Stay (SKILLED_NURSING_FACILITY): Payer: Medicare Other | Admitting: Internal Medicine

## 2017-12-19 ENCOUNTER — Encounter: Payer: Self-pay | Admitting: Internal Medicine

## 2017-12-19 DIAGNOSIS — J449 Chronic obstructive pulmonary disease, unspecified: Secondary | ICD-10-CM

## 2017-12-19 DIAGNOSIS — R630 Anorexia: Secondary | ICD-10-CM

## 2017-12-19 DIAGNOSIS — K219 Gastro-esophageal reflux disease without esophagitis: Secondary | ICD-10-CM | POA: Diagnosis not present

## 2017-12-19 NOTE — Progress Notes (Signed)
[facility] Room Number: [id number] Place of Service:  SNF (31)  Randon Goldsmithnne D. Lyn HollingsheadAlexander, MD  Patient Care Team: Deeann SaintBanks, Shannon R, MD as PCP - General [facility](Family Medicine)  Extended Emergency Contact Information Primary Emergency Contact: Cathlyn ParsonsWaller,[name]  [place] Home Phone: [phone] Relation: Sister    Allergies: Other; Penicillins; and Vitamin b12  Chief Complaint  Patient presents with  . Medical Management of Chronic Issues    Routine Visit    HPI: Patient is 84 y.o. male who is being seen for routine issues of COPD, GERD, and poor appetite.  Past Medical History:  Diagnosis Date  . CHF (congestive heart failure) (HCC)   . Chronic kidney disease   . COPD (chronic obstructive pulmonary disease) (HCC)   . GERD (gastroesophageal reflux disease)   . Hypertension   . Respiratory insufficiency     History reviewed. No pertinent surgical history.  Allergies as of 12/19/2017      Reactions   Other Other (See Comments)   Oxygen Sensitive   Penicillins Rash, Other (See Comments)   Any -illins as well as Mycins Has patient had a PCN reaction causing immediate rash, facial/tongue/throat swelling, SOB or lightheadedness with hypotension: Yes Has patient had a PCN reaction causing severe rash involving mucus membranes or skin necrosis: Yes Has patient had a PCN reaction that required hospitalization No Has patient had a PCN reaction occurring within the last 10 years: No If all of the above answers are "NO", then may proceed with Cephalosporin use.   Vitamin B12 Itching, Rash, Other (See Comments)   injections      Medication List        Accurate as of 12/19/17 11:59 PM. Always use your most recent med list.          albuterol (2.5 MG/3ML) 0.083% nebulizer solution Commonly known as:  PROVENTIL Take 3 mLs (2.5 mg total) by nebulization every 6 (six) hours. DX:  J44.9   albuterol 108 (90 Base) MCG/ACT  inhaler Commonly known as:  PROVENTIL HFA;VENTOLIN HFA Inhale 2 puffs into the lungs every 6 (six) hours as needed for wheezing or shortness of breath.   amLODipine 10 MG tablet Commonly known as:  NORVASC Take 10 mg by mouth daily.   aspirin EC 81 MG tablet Take 81 mg by mouth daily.   budesonide-formoterol 160-4.5 MCG/ACT inhaler Commonly known as:  SYMBICORT Inhale 2 puffs into the lungs 2 (two) times daily.   calcitRIOL 0.25 MCG capsule Commonly known as:  ROCALTROL Take 0.25 mcg by mouth every other day.   camphor-menthol lotion Commonly known as:  SARNA Apply 1 application topically as needed for itching.   clotrimazole 1 % cream Commonly known as:  LOTRIMIN Apply 1 application topically daily.   fluconazole 100 MG tablet Commonly known as:  DIFLUCAN Take 100 mg by mouth daily.   furosemide 20 MG tablet Commonly known as:  LASIX Take 60 mg by mouth daily.   hydrocerin Crea Apply 1 application topically 2 (two) times daily.   hydrOXYzine 25 MG tablet Commonly known as:  ATARAX/VISTARIL Take 25 mg by mouth every 6 (six) hours. Itching   insulin aspart 100 UNIT/ML injection Commonly known as:  novoLOG Inject 2-15 Units into the skin 3 (three) times daily before meals. SQ: 121-150= 2 units, 151-200= 3 units, 201-250= 5 units, 251-300= 8 units, 301-350= 11 units, 351-400=15 units, Greater than 400=15 units and Call MD   isosorbide mononitrate 30  MG 24 hr tablet Commonly known as:  IMDUR Take 30 mg by mouth daily.   LORazepam 0.5 MG tablet Commonly known as:  ATIVAN Take 0.5 mg by mouth 2 (two) times daily.   omeprazole 20 MG capsule Commonly known as:  PRILOSEC Take 20 mg by mouth daily.   terbinafine 1 % cream Commonly known as:  LAMISIL Apply 1 application topically 2 (two) times daily. Apply to entire body   tiotropium 18 MCG inhalation capsule Commonly known as:  SPIRIVA Place 18 mcg into inhaler and inhale daily.   triamcinolone cream 0.1  % Commonly known as:  KENALOG Apply 1 application topically daily.       No orders of the defined types were placed in this encounter.   Immunization History  Administered Date(s) Administered  . Influenza, High Dose Seasonal PF 05/06/2017  . Influenza,inj,Quad PF,6+ Mos 02/15/2016  . Influenza-Unspecified 05/03/2015  . Pneumococcal Conjugate-13 04/19/2016  . Pneumococcal Polysaccharide-23 09/18/2015    Social History   Tobacco Use  . Smoking status: Former Smoker    Packs/day: 0.50    Years: 70.00    Pack years: 35.00    Types: Cigarettes    Last attempt to quit: 04/19/2013    Years since quitting: 4.7  . Smokeless tobacco: Never Used  Substance Use Topics  . Alcohol use: No    Alcohol/week: 0.0 oz    Review of Systems  DATA OBTAINED: from patient GENERAL:  no fevers, fatigue, appetite changes SKIN: No itching, rash HEENT: No complaint RESPIRATORY: No cough, wheezing, SOB CARDIAC: No chest pain, palpitations, lower extremity edema  GI: No abdominal pain, No N/V/D or constipation, No heartburn or reflux  GU: No dysuria, frequency or urgency, or incontinence  MUSCULOSKELETAL: No unrelieved bone/joint pain NEUROLOGIC: No headache, dizziness  PSYCHIATRIC: No overt anxiety or sadness  Vitals:   12/19/17 1234  BP: 131/65  Pulse: 86  Resp: 18  Temp: (!) 97.1 F (36.2 C)   Body mass index is 25.94 kg/m. Physical Exam  GENERAL APPEARANCE: Alert, conversant, No acute distress  SKIN: No diaphoresis; skin with scale that is red underneath HEENT: Unremarkable RESPIRATORY: Breathing is even, unlabored. Lung sounds are clear   CARDIOVASCULAR: Heart RRR no murmurs, rubs or gallops. No peripheral edema  GASTROINTESTINAL: Abdomen is soft, non-tender, not distended w/ normal bowel sounds.  GENITOURINARY: Bladder non tender, not distended  MUSCULOSKELETAL: No abnormal joints or musculature NEUROLOGIC: Cranial nerves 2-12 grossly intact. Moves all  extremities PSYCHIATRIC: Mood and affect appropriate to situation, no behavioral issues  Patient Active Problem List   Diagnosis Date Noted  . Poor appetite 01/02/2018  . Acute on chronic respiratory failure with hypoxemia (HCC) 11/22/2017  . HCAP (healthcare-associated pneumonia) 11/22/2017  . Aspiration pneumonia (HCC) 11/22/2017  . Macular rash 11/22/2017  . COPD with acute exacerbation (HCC) 10/18/2017  . Type 2 diabetes mellitus with other specified complication (HCC) 10/12/2017  . Pruritic rash 08/14/2017  . Pain and swelling of toe of left foot 08/14/2017  . Pressure injury of skin 04/12/2016  . CKD (chronic kidney disease), stage IV (HCC) 04/11/2016  . QT prolongation 04/11/2016  . Hypokalemia 04/11/2016  . Chronic combined systolic and diastolic CHF (congestive heart failure) (HCC) 01/11/2016  . GERD (gastroesophageal reflux disease) 05/28/2015  . COPD (chronic obstructive pulmonary disease) (HCC) 05/10/2015  . Essential hypertension 05/10/2015    CMP     Component Value Date/Time   NA 139 12/27/2017   NA 140 12/05/2017   K 4.5 12/27/2017  K 3.8 12/05/2017   CL 101 11/17/2017 0554   CO2 28 11/17/2017 0554   GLUCOSE 111 (H) 11/17/2017 0554   BUN 36 (A) 12/27/2017   CREATININE 2.7 (A) 12/27/2017   CREATININE 2.14 12/05/2017   CALCIUM 9.1 12/05/2017   PROT 6.8 11/12/2017 1612   ALBUMIN 3.2 (L) 11/12/2017 1612   AST 53 (A) 12/27/2017   ALT 32 12/27/2017   ALKPHOS 270 (A) 12/27/2017   BILITOT 0.5 11/12/2017 1612   GFRNONAA 33 (L) 11/17/2017 0554   GFRAA 30.83 12/05/2017   Recent Labs    10/18/17 1611  10/20/17 0517  11/12/17 1029  11/15/17 0547 11/16/17 0553 11/17/17 0554  11/28/17 12/05/17 12/27/17  NA 147*   < > 140   < > 143   < > 136 139 139   < > 140 140 139  K 3.5   < > 4.0   < > 3.0*   < > 4.5 4.6 4.8   < > 3.9 3.8 4.5  CL 103   < > 99*   < > 99*   < > 100* 100* 101  --   --   --   --   CO2 27   < > 26   < > 30   < > 26 32 28  --   --   --   --    GLUCOSE 131*   < > 178*   < > 146*   < > 159* 123* 111*  --   --   --   --   BUN 22*   < > 37*   < > 33*   < > 59* 57* 48*   < > 36* 17 36*  CREATININE 2.47*   < > 2.30*   < > 1.98*   < > 2.40* 2.12* 1.77*   < > 2.0* 2.14 2.7*  CALCIUM 9.9   < > 9.5   < > 9.2   < > 9.0 9.0 9.4  --   --  9.1  --   MG 2.2  --  2.6*  --  2.0  --   --   --   --   --   --   --   --    < > = values in this interval not displayed.   Recent Labs    08/21/17 0543 11/12/17 1029 11/12/17 1612 12/27/17  AST 34 37 41 53*  ALT 67* 56 57 32  ALKPHOS 51 76 73 270*  BILITOT 0.6 0.6 0.5  --   PROT 5.7* 7.2 6.8  --   ALBUMIN 2.8* 3.4* 3.2*  --    Recent Labs    11/15/17 0547 11/16/17 0553 11/17/17 0554 11/22/17 12/10/17 12/27/17  WBC 11.9* 12.6* 15.7* 12.4 7.9 12.2  NEUTROABS 9.8* 10.0 12.7*  --   --   --   HGB 11.9* 11.9* 12.0* 11.4* 11.2 10.6*  HCT 37.4* 37.7* 37.5* 36* 35.2 34*  MCV 84.0 84.3 83.1  --  81.2  --   PLT 246 245 266 238  --  404*   Recent Labs    12/27/17  CHOL 195  LDLCALC 115  TRIG 252*   No results found for: [facility] Lab Results  Component Value Date   TSH 0.689 11/13/2017   Lab Results  Component Value Date   HGBA1C 8.0 12/27/2017   Lab Results  Component Value Date   CHOL 195 12/27/2017   HDL 30 (A) 12/27/2017  LDLCALC 115 12/27/2017   TRIG 252 (A) 12/27/2017    Significant Diagnostic Results in last 30 days:  No results found.  Assessment and Plan  COPD (chronic obstructive pulmonary disease) (HCC) Without flares; continue Symbicort and Spiriva daily  GERD (gastroesophageal reflux disease) No complaints of pain or reflux; continue omeprazole 20 mg daily  Poor appetite I have spoken with patient about this, he says he does not hungry but admits he will drink nutrition so I have started Glucerna with every meal     Chelcy Bolda D. [name], MD

## 2017-12-20 ENCOUNTER — Encounter: Payer: Self-pay | Admitting: Internal Medicine

## 2017-12-22 ENCOUNTER — Encounter: Payer: Self-pay | Admitting: Internal Medicine

## 2017-12-26 ENCOUNTER — Encounter: Payer: Self-pay | Admitting: Internal Medicine

## 2017-12-26 ENCOUNTER — Non-Acute Institutional Stay (SKILLED_NURSING_FACILITY): Payer: Medicare Other | Admitting: Internal Medicine

## 2017-12-26 ENCOUNTER — Non-Acute Institutional Stay: Payer: Self-pay | Admitting: Internal Medicine

## 2017-12-26 DIAGNOSIS — R635 Abnormal weight gain: Secondary | ICD-10-CM

## 2017-12-26 DIAGNOSIS — R21 Rash and other nonspecific skin eruption: Secondary | ICD-10-CM

## 2017-12-26 DIAGNOSIS — M10072 Idiopathic gout, left ankle and foot: Secondary | ICD-10-CM | POA: Diagnosis not present

## 2017-12-26 NOTE — Progress Notes (Signed)
:  [facility] Room Number: [phone] Place of Service:  SNF (31)  Greg Jones. [name], MD  Patient Care Team: Deeann Saint, MD as PCP - General [facility] Medicine)  Extended Emergency Contact Information Primary Emergency Contact: Greg Jones [place] Home Phone: [phone] Relation: Sister     Allergies: Other; Penicillins; and Vitamin b12  Chief Complaint  Patient presents with  . Acute Visit    Follow up    HPI: Patient is 84 y.o. male who is being seen in follow-up for weight gain.  Patient denies cough shortness of breath chest pain, he is no edema he does admit that he has some urgency and dysuria for the last 3 weeks.  Patient has no wheezing or rails  Past Medical History:  Diagnosis Date  . CHF (congestive heart failure) (HCC)   . Chronic kidney disease   . COPD (chronic obstructive pulmonary disease) (HCC)   . GERD (gastroesophageal reflux disease)   . Hypertension   . Respiratory insufficiency     History reviewed. No pertinent surgical history.  Allergies as of 12/26/2017      Reactions   Other Other (See Comments)   Oxygen Sensitive   Penicillins Rash, Other (See Comments)   Any -illins as well as Mycins Has patient had a PCN reaction causing immediate rash, facial/tongue/throat swelling, SOB or lightheadedness with hypotension: Yes Has patient had a PCN reaction causing severe rash involving mucus membranes or skin necrosis: Yes Has patient had a PCN reaction that required hospitalization No Has patient had a PCN reaction occurring within the last 10 years: No If all of the above answers are "NO", then may proceed with Cephalosporin use.   Vitamin B12 Itching, Rash, Other (See Comments)   injections      Medication List        Accurate as of 12/26/17 11:59 PM. Always use your most recent med list.          albuterol (2.5 MG/3ML) 0.083% nebulizer solution Commonly known as:   PROVENTIL Take 3 mLs (2.5 mg total) by nebulization every 6 (six) hours. DX:  J44.9   albuterol 108 (90 Base) MCG/ACT inhaler Commonly known as:  PROVENTIL HFA;VENTOLIN HFA Inhale 2 puffs into the lungs every 6 (six) hours as needed for wheezing or shortness of breath.   aspirin EC 81 MG tablet Take 81 mg by mouth daily.   budesonide-formoterol 160-4.5 MCG/ACT inhaler Commonly known as:  SYMBICORT Inhale 2 puffs into the lungs 2 (two) times daily.   calcitRIOL 0.25 MCG capsule Commonly known as:  ROCALTROL Take 0.25 mcg by mouth every other day.   camphor-menthol lotion Commonly known as:  SARNA Apply 1 application topically as needed for itching.   fluconazole 100 MG tablet Commonly known as:  DIFLUCAN Take 100 mg by mouth daily.   furosemide 20 MG tablet Commonly known as:  LASIX Take 20 mg by mouth. take 3 tabs (60 mg) po daily for CHF   GLUCERNA Liqd Take 237 mLs by mouth. GIVE 1 CAN (237 Ml) WITH EACH MEAL AS SUPPLEMENT   hydrocerin Crea Apply 1 application topically 2 (two) times daily.   hydrOXYzine 25 MG tablet Commonly known as:  ATARAX/VISTARIL Take 25 mg by mouth every 6 (six) hours. Itching   insulin aspart 100 UNIT/ML injection Commonly known as:  novoLOG Inject 2-15 Units into the skin 3 (three) times daily before meals. SQ: 121-150= 2 units, 151-200= 3  units, 201-250= 5 units, 251-300= 8 units, 301-350= 11 units, 351-400=15 units, Greater than 400=15 units and Call MD   isosorbide mononitrate 30 MG 24 hr tablet Commonly known as:  IMDUR Take 30 mg by mouth daily.   LORazepam 0.5 MG tablet Commonly known as:  ATIVAN Take 0.5 mg by mouth 2 (two) times daily.   multivitamin with minerals tablet Take 1 tablet by mouth daily. Give 1 tablet by mouth qd to promote wound healing   omeprazole 20 MG capsule Commonly known as:  PRILOSEC Take 20 mg by mouth daily.   terbinafine 1 % cream Commonly known as:  LAMISIL Apply 1 application topically 2 (two)  times daily. Apply to entire body   tiotropium 18 MCG inhalation capsule Commonly known as:  SPIRIVA Place 18 mcg into inhaler and inhale daily.       No orders of the defined types were placed in this encounter.   Immunization History  Administered Date(s) Administered  . Influenza, High Dose Seasonal PF 05/06/2017  . Influenza,inj,Quad PF,6+ Mos 02/15/2016  . Influenza-Unspecified 05/03/2015  . Pneumococcal Conjugate-13 04/19/2016  . Pneumococcal Polysaccharide-23 09/18/2015    Social History   Tobacco Use  . Smoking status: Former Smoker    Packs/day: 0.50    Years: 70.00    Pack years: 35.00    Types: Cigarettes    Last attempt to quit: 04/19/2013    Years since quitting: 4.6  . Smokeless tobacco: Never Used  Substance Use Topics  . Alcohol use: No    Alcohol/week: 0.0 oz    Family history is   Family History  Problem Relation Age of Onset  . Tuberculosis Mother   . Diabetes Father       Review of Systems  DATA OBTAINED: from patient, nurse GENERAL:  no fevers, fatigue, appetite changes SKIN: No itching, or rash EYES: No eye pain, redness, discharge EARS: No earache, tinnitus, change in hearing NOSE: No congestion, drainage or bleeding  MOUTH/THROAT: No mouth or tooth pain, No sore throat RESPIRATORY: No cough, wheezing, SOB CARDIAC: No chest pain, palpitations, lower extremity edema  GI: No abdominal pain, No N/V/D or constipation, No heartburn or reflux  GU: No dysuria, frequency or urgency, or incontinence  MUSCULOSKELETAL: No unrelieved bone/joint pain NEUROLOGIC: No headache, dizziness or focal weakness PSYCHIATRIC: No c/o anxiety or sadness   Vitals:   12/26/17 1459  BP: 100/63  Pulse: 98  Resp: 18  Temp: (!) 97.3 F (36.3 C)    SpO2 Readings from Last 1 Encounters:  12/03/17 95%   Body mass index is 24.48 kg/m.     Physical Exam  GENERAL APPEARANCE: Alert, conversant,  No acute distress.  SKIN: No diaphoresis rash HEAD:  Normocephalic, atraumatic  EYES: Conjunctiva/lids clear. Pupils round, reactive. EOMs intact.  EARS: External exam WNL, canals clear. Hearing grossly normal.  NOSE: No deformity or discharge.  MOUTH/THROAT: Lips w/o lesions  RESPIRATORY: Breathing is even, unlabored. Lung sounds are decreased diffusely but no rales or wheezes CARDIOVASCULAR: Heart RRR no murmurs, rubs or gallops. No peripheral edema.   GASTROINTESTINAL: Abdomen is soft, non-tender, not distended w/ normal bowel sounds. GENITOURINARY: Bladder non tender, not distended  MUSCULOSKELETAL: No abnormal joints or musculature NEUROLOGIC:  Cranial nerves 2-12 grossly intact. Moves all extremities  PSYCHIATRIC: Mood and affect appropriate to situation, no behavioral issues  Patient Active Problem List   Diagnosis Date Noted  . Acute on chronic respiratory failure with hypoxemia (HCC) 11/22/2017  . HCAP (healthcare-associated pneumonia) 11/22/2017  .  Aspiration pneumonia (HCC) 11/22/2017  . Macular rash 11/22/2017  . COPD with acute exacerbation (HCC) 10/18/2017  . Type 2 diabetes mellitus with other specified complication (HCC) 10/12/2017  . Pruritic rash 08/14/2017  . Pain and swelling of toe of left foot 08/14/2017  . Pressure injury of skin 04/12/2016  . CKD (chronic kidney disease), stage IV (HCC) 04/11/2016  . QT prolongation 04/11/2016  . Hypokalemia 04/11/2016  . Chronic combined systolic and diastolic CHF (congestive heart failure) (HCC) 01/11/2016  . GERD (gastroesophageal reflux disease) 05/28/2015  . COPD (chronic obstructive pulmonary disease) (HCC) 05/10/2015  . Essential hypertension 05/10/2015      Labs reviewed: Basic Metabolic Panel:    Component Value Date/Time   NA 140 12/05/2017   K 3.8 12/05/2017   CL 101 11/17/2017 0554   CO2 28 11/17/2017 0554   GLUCOSE 111 (H) 11/17/2017 0554   BUN 17 12/05/2017   CREATININE 2.14 12/05/2017   CALCIUM 9.1 12/05/2017   PROT 6.8 11/12/2017 1612   ALBUMIN 3.2  (L) 11/12/2017 1612   AST 41 11/12/2017 1612   ALT 57 11/12/2017 1612   ALKPHOS 73 11/12/2017 1612   BILITOT 0.5 11/12/2017 1612   GFRNONAA 33 (L) 11/17/2017 0554   GFRAA 30.83 12/05/2017    Recent Labs    10/18/17 1611  10/20/17 0517  11/12/17 1029  11/15/17 0547 11/16/17 0553 11/17/17 0554 11/22/17 11/28/17 12/05/17  NA 147*   < > 140   < > 143   < > 136 139 139 137 140 140  K 3.5   < > 4.0   < > 3.0*   < > 4.5 4.6 4.8 4.2 3.9 3.8  CL 103   < > 99*   < > 99*   < > 100* 100* 101  --   --   --   CO2 27   < > 26   < > 30   < > 26 32 28  --   --   --   GLUCOSE 131*   < > 178*   < > 146*   < > 159* 123* 111*  --   --   --   BUN 22*   < > 37*   < > 33*   < > 59* 57* 48* 44* 36* 17  CREATININE 2.47*   < > 2.30*   < > 1.98*   < > 2.40* 2.12* 1.77* 2.3* 2.0* 2.14  CALCIUM 9.9   < > 9.5   < > 9.2   < > 9.0 9.0 9.4  --   --  9.1  MG 2.2  --  2.6*  --  2.0  --   --   --   --   --   --   --    < > = values in this interval not displayed.   Liver Function Tests: Recent Labs    08/21/17 0543 11/12/17 1029 11/12/17 1612  AST 34 37 41  ALT 67* 56 57  ALKPHOS 51 76 73  BILITOT 0.6 0.6 0.5  PROT 5.7* 7.2 6.8  ALBUMIN 2.8* 3.4* 3.2*   No results for input(s): LIPASE, AMYLASE in the last 8760 hours. No results for input(s): AMMONIA in the last 8760 hours. CBC: Recent Labs    11/15/17 0547 11/16/17 0553 11/17/17 0554 11/22/17 12/10/17  WBC 11.9* 12.6* 15.7* 12.4 7.9  NEUTROABS 9.8* 10.0 12.7*  --   --   HGB 11.9* 11.9* 12.0* 11.4* 11.2  HCT  37.4* 37.7* 37.5* 36* 35.2  MCV 84.0 84.3 83.1  --  81.2  PLT 246 245 266 238  --    Lipid No results for input(s): CHOL, HDL, LDLCALC, TRIG in the last 8760 hours.  Cardiac Enzymes: Recent Labs    08/14/17 2207 08/15/17 0746 08/15/17 1045  TROPONINI 0.03* 0.03* 0.03*   BNP: Recent Labs    08/14/17 1717 10/18/17 1611 11/12/17 1029  BNP 96.7 147.6* 134.9*   No results found for: MICROALBUR Lab Results  Component Value Date     HGBA1C 8.1 (H) 11/13/2017   Lab Results  Component Value Date   TSH 0.689 11/13/2017   No results found for: VITAMINB12 No results found for: FOLATE No results found for: IRON, TIBC, FERRITIN  Imaging and Procedures obtained prior to SNF admission: Dg Chest 2 View  Result Date: 11/12/2017 CLINICAL DATA:  Short of breath with exertion. EXAM: CHEST - 2 VIEW COMPARISON:  10/21/2017 FINDINGS: Cardiomegaly. Mild vascular congestion. No Kerley B lines. Mild patchy airspace disease in the right mid and lower lung zones. No pneumothorax or pleural effusion. IMPRESSION: Patchy airspace disease in the right mid and lower lung. Followup PA and lateral chest X-ray is recommended in 3-4 weeks following trial of antibiotic therapy to ensure resolution and exclude underlying malignancy. Cardiomegaly and vascular congestion without pulmonary edema. Electronically Signed   By: Jolaine ClickArthur  Hoss M.D.   On: 11/12/2017 10:42     Not all labs, radiology exams or other studies done during hospitalization come through on my EPIC note; however they are reviewed by me.    Assessment and Plan-  Weight gain-was on Lasix 80 mg daily for 4 days and is now going back to 60 mg daily patient improved has no edema lungs without rails; will continue to monitor  Palestine Mosco D. Lyn HollingsheadAlexander, MD

## 2017-12-27 ENCOUNTER — Encounter: Payer: Self-pay | Admitting: Internal Medicine

## 2017-12-27 LAB — VITAMIN D 25 HYDROXY (VIT D DEFICIENCY, FRACTURES): Vit D, 25-Hydroxy: 42.55

## 2017-12-27 LAB — BASIC METABOLIC PANEL
BUN: 36 — AB (ref 4–21)
CREATININE: 2.7 — AB (ref 0.6–1.3)
GLUCOSE: 164
Potassium: 4.5 (ref 3.4–5.3)
Sodium: 139 (ref 137–147)

## 2017-12-27 LAB — CBC AND DIFFERENTIAL
HCT: 34 — AB (ref 41–53)
HEMOGLOBIN: 10.6 — AB (ref 13.5–17.5)
Platelets: 404 — AB (ref 150–399)
WBC: 12.2

## 2017-12-27 LAB — HEPATIC FUNCTION PANEL
ALK PHOS: 270 — AB (ref 25–125)
ALT: 32 (ref 10–40)
AST: 53 — AB (ref 14–40)
BILIRUBIN, TOTAL: 0.4
Bilirubin, Direct: 0 — AB (ref 0.01–0.4)

## 2017-12-27 LAB — LIPID PANEL
CHOLESTEROL: 195 (ref 0–200)
HDL: 30 — AB (ref 35–70)
LDL Cholesterol: 115
Triglycerides: 252 — AB (ref 40–160)

## 2017-12-27 LAB — HEMOGLOBIN A1C: Hemoglobin A1C: 8

## 2017-12-27 LAB — POCT ERYTHROCYTE SEDIMENTATION RATE, NON-AUTOMATED: SED RATE: 92

## 2017-12-27 NOTE — Addendum Note (Signed)
Addended by: Merrilee SeashoreALEXANDER, Othman Masur D on: 12/27/2017 09:25 AM   Modules accepted: Level of Service, SmartSet

## 2017-12-27 NOTE — Progress Notes (Signed)
This encounter was created in error - please disregard.

## 2017-12-27 NOTE — Progress Notes (Signed)
Location:   [facility]:   Skilled nursing facility Provider: Margit Hanks MD   Patient Care Team: Deeann Saint, MD as PCP - General (Family Medicine)  Extended Emergency Contact Information Primary Emergency Contact: [name] [place] of [place] Home Phone: [phone] Relation: Sister    Allergies: Other; Penicillins; and Vitamin b12  Chief Complaint  Patient presents with  . Acute Visit    Rash    HPI: Patient is 84 y.o. male who the wound care nurse asked me to see.  She has been using bag balm on his skin all over his body and he has been sloughing all his dry skin and underneath his skin is very red.  This is the first time we have really been able to see this.  Patient denies pain but admits that it is very itchy.  This is a same rash the patient has had since February 2019.  Many things have been used on it at the hospital including antifungals prednisone and anti-inflammatories.  Nothing is impacted.  Patient has seen the dermatologist who did not do a biopsy on the first visit but is planning to do a biopsy on the upcoming visit.  Past Medical History:  Diagnosis Date  . CHF (congestive heart failure) (HCC)   . Chronic kidney disease   . COPD (chronic obstructive pulmonary disease) (HCC)   . GERD (gastroesophageal reflux disease)   . Hypertension   . Respiratory insufficiency     History reviewed. No pertinent surgical history.  Allergies as of 12/26/2017      Reactions   Other Other (See Comments)   Oxygen Sensitive   Penicillins Rash, Other (See Comments)   Any -illins as well as Mycins Has patient had a PCN reaction causing immediate rash, facial/tongue/throat swelling, SOB or lightheadedness with hypotension: Yes Has patient had a PCN reaction causing severe rash involving mucus membranes or skin necrosis: Yes Has patient had a PCN reaction that required hospitalization No Has patient had a PCN reaction occurring  within the last 10 years: No If all of the above answers are "NO", then may proceed with Cephalosporin use.   Vitamin B12 Itching, Rash, Other (See Comments)   injections      Medication List        Accurate as of 12/26/17 11:59 PM. Always use your most recent med list.          albuterol (2.5 MG/3ML) 0.083% nebulizer solution Commonly known as:  PROVENTIL Take 3 mLs (2.5 mg total) by nebulization every 6 (six) hours. DX:  J44.9   albuterol 108 (90 Base) MCG/ACT inhaler Commonly known as:  PROVENTIL HFA;VENTOLIN HFA Inhale 2 puffs into the lungs every 6 (six) hours as needed for wheezing or shortness of breath.   aspirin EC 81 MG tablet Take 81 mg by mouth daily.   budesonide-formoterol 160-4.5 MCG/ACT inhaler Commonly known as:  SYMBICORT Inhale 2 puffs into the lungs 2 (two) times daily.   calcitRIOL 0.25 MCG capsule Commonly known as:  ROCALTROL Take 0.25 mcg by mouth every other day.   camphor-menthol lotion Commonly known as:  SARNA Apply 1 application topically as needed for itching.   fluconazole 100 MG tablet Commonly known as:  DIFLUCAN Take 100 mg by mouth daily.   furosemide 20 MG tablet Commonly known as:  LASIX Take 20 mg by mouth. take 3 tabs (60 mg) po daily for CHF   GLUCERNA Liqd Take 237 mLs by  mouth. GIVE 1 CAN (237 Ml) WITH EACH MEAL AS SUPPLEMENT   hydrocerin Crea Apply 1 application topically 2 (two) times daily.   hydrOXYzine 25 MG tablet Commonly known as:  ATARAX/VISTARIL Take 25 mg by mouth every 6 (six) hours. Itching   insulin aspart 100 UNIT/ML injection Commonly known as:  novoLOG Inject 2-15 Units into the skin 3 (three) times daily before meals. SQ: 121-150= 2 units, 151-200= 3 units, 201-250= 5 units, 251-300= 8 units, 301-350= 11 units, 351-400=15 units, Greater than 400=15 units and Call MD   isosorbide mononitrate 30 MG 24 hr tablet Commonly known as:  IMDUR Take 30 mg by mouth daily.   LORazepam 0.5 MG tablet Commonly  known as:  ATIVAN Take 0.5 mg by mouth 2 (two) times daily.   multivitamin with minerals tablet Take 1 tablet by mouth daily. Give 1 tablet by mouth qd to promote wound healing   omeprazole 20 MG capsule Commonly known as:  PRILOSEC Take 20 mg by mouth daily.   terbinafine 1 % cream Commonly known as:  LAMISIL Apply 1 application topically 2 (two) times daily. Apply to entire body   tiotropium 18 MCG inhalation capsule Commonly known as:  SPIRIVA Place 18 mcg into inhaler and inhale daily.       No orders of the defined types were placed in this encounter.   Immunization History  Administered Date(s) Administered  . Influenza, High Dose Seasonal PF 05/06/2017  . Influenza,inj,Quad PF,6+ Mos 02/15/2016  . Influenza-Unspecified 05/03/2015  . Pneumococcal Conjugate-13 04/19/2016  . Pneumococcal Polysaccharide-23 09/18/2015    Social History   Tobacco Use  . Smoking status: Former Smoker    Packs/day: 0.50    Years: 70.00    Pack years: 35.00    Types: Cigarettes    Last attempt to quit: 04/19/2013    Years since quitting: 4.6  . Smokeless tobacco: Never Used  Substance Use Topics  . Alcohol use: No    Alcohol/week: 0.0 oz    Review of Systems  DATA OBTAINED: from patient GENERAL:  no fevers, fatigue, appetite changes SKIN: + itching,+ rash HEENT: No complaint RESPIRATORY: No cough, wheezing, SOB CARDIAC: No chest pain, palpitations, lower extremity edema  GI: No abdominal pain, No N/V/D or constipation, No heartburn or reflux  GU: No dysuria, frequency or urgency, or incontinence  MUSCULOSKELETAL: No unrelieved bone/joint pain NEUROLOGIC: No headache, dizziness  PSYCHIATRIC: No overt anxiety or sadness  Vitals:   12/27/17 0926  BP: 100/63  Pulse: 98  Resp: 18  Temp: (!) 97.3 F (36.3 C)   Body mass index is 24.48 kg/m. Physical Exam  GENERAL APPEARANCE: Alert, conversant, No acute distress  SKIN: Patient skin is red in color arms legs torso  neck.  There is no swelling involved, no heat HEENT: Lower lip with mild swelling RESPIRATORY: Breathing is even, unlabored. Lung sounds are clear   CARDIOVASCULAR: Heart RRR no murmurs, rubs or gallops. No peripheral edema  GASTROINTESTINAL: Abdomen is soft, non-tender, not distended w/ normal bowel sounds.  GENITOURINARY: Bladder non tender, not distended  MUSCULOSKELETAL: Left first MCP with swelling and redness and tenderness NEUROLOGIC: Cranial nerves 2-12 grossly intact. Moves all extremities PSYCHIATRIC: Mood and affect appropriate to situation, no behavioral issues  Patient Active Problem List   Diagnosis Date Noted  . Acute on chronic respiratory failure with hypoxemia (HCC) 11/22/2017  . HCAP (healthcare-associated pneumonia) 11/22/2017  . Aspiration pneumonia (HCC) 11/22/2017  . Macular rash 11/22/2017  . COPD with acute exacerbation (  HCC) 10/18/2017  . Type 2 diabetes mellitus with other specified complication (HCC) 10/12/2017  . Pruritic rash 08/14/2017  . Pain and swelling of toe of left foot 08/14/2017  . Pressure injury of skin 04/12/2016  . CKD (chronic kidney disease), stage IV (HCC) 04/11/2016  . QT prolongation 04/11/2016  . Hypokalemia 04/11/2016  . Chronic combined systolic and diastolic CHF (congestive heart failure) (HCC) 01/11/2016  . GERD (gastroesophageal reflux disease) 05/28/2015  . COPD (chronic obstructive pulmonary disease) (HCC) 05/10/2015  . Essential hypertension 05/10/2015    CMP     Component Value Date/Time   NA 140 12/05/2017   K 3.8 12/05/2017   CL 101 11/17/2017 0554   CO2 28 11/17/2017 0554   GLUCOSE 111 (H) 11/17/2017 0554   BUN 17 12/05/2017   CREATININE 2.14 12/05/2017   CALCIUM 9.1 12/05/2017   PROT 6.8 11/12/2017 1612   ALBUMIN 3.2 (L) 11/12/2017 1612   AST 41 11/12/2017 1612   ALT 57 11/12/2017 1612   ALKPHOS 73 11/12/2017 1612   BILITOT 0.5 11/12/2017 1612   GFRNONAA 33 (L) 11/17/2017 0554   GFRAA 30.83 12/05/2017    Recent Labs    10/18/17 1611  10/20/17 0517  11/12/17 1029  11/15/17 0547 11/16/17 0553 11/17/17 0554 11/22/17 11/28/17 12/05/17  NA 147*   < > 140   < > 143   < > 136 139 139 137 140 140  K 3.5   < > 4.0   < > 3.0*   < > 4.5 4.6 4.8 4.2 3.9 3.8  CL 103   < > 99*   < > 99*   < > 100* 100* 101  --   --   --   CO2 27   < > 26   < > 30   < > 26 32 28  --   --   --   GLUCOSE 131*   < > 178*   < > 146*   < > 159* 123* 111*  --   --   --   BUN 22*   < > 37*   < > 33*   < > 59* 57* 48* 44* 36* 17  CREATININE 2.47*   < > 2.30*   < > 1.98*   < > 2.40* 2.12* 1.77* 2.3* 2.0* 2.14  CALCIUM 9.9   < > 9.5   < > 9.2   < > 9.0 9.0 9.4  --   --  9.1  MG 2.2  --  2.6*  --  2.0  --   --   --   --   --   --   --    < > = values in this interval not displayed.   Recent Labs    08/21/17 0543 11/12/17 1029 11/12/17 1612  AST 34 37 41  ALT 67* 56 57  ALKPHOS 51 76 73  BILITOT 0.6 0.6 0.5  PROT 5.7* 7.2 6.8  ALBUMIN 2.8* 3.4* 3.2*   Recent Labs    11/15/17 0547 11/16/17 0553 11/17/17 0554 11/22/17 12/10/17  WBC 11.9* 12.6* 15.7* 12.4 7.9  NEUTROABS 9.8* 10.0 12.7*  --   --   HGB 11.9* 11.9* 12.0* 11.4* 11.2  HCT 37.4* 37.7* 37.5* 36* 35.2  MCV 84.0 84.3 83.1  --  81.2  PLT 246 245 266 238  --    No results for input(s): CHOL, LDLCALC, TRIG in the last 8760 hours.  Invalid input(s): HCL No results found for: [facility]  Lab Results  Component Value Date   TSH 0.689 11/13/2017   Lab Results  Component Value Date   HGBA1C 8.1 (H) 11/13/2017   No results found for: CHOL, HDL, LDLCALC, LDLDIRECT, TRIG, CHOLHDL  Significant Diagnostic Results in last 30 days:  No results found.  Assessment and Plan  Total body rash- no clue, need biopsy patient has dermatology appointment in about 5 days; in the meantime we will continue Atarax 12.5 mg 3 times daily, and will add Zantac 300 mg daily and Zyrtec 10 mg daily  Acute gout left great toe- short prednisone taper 60/60/40/40/20/20  Will  follow-up on both interventions in several days    Time spent greater than 35 minutes Merrilee SeashoreAnne Alexander, MD

## 2018-01-02 ENCOUNTER — Encounter: Payer: Self-pay | Admitting: Internal Medicine

## 2018-01-02 DIAGNOSIS — R630 Anorexia: Secondary | ICD-10-CM | POA: Insufficient documentation

## 2018-01-02 NOTE — Assessment & Plan Note (Signed)
No complaints of pain or reflux; continue omeprazole 20 mg daily

## 2018-01-02 NOTE — Assessment & Plan Note (Signed)
Without flares; continue Symbicort and Spiriva daily

## 2018-01-02 NOTE — Assessment & Plan Note (Signed)
I have spoken with patient about this, he says he does not hungry but admits he will drink nutrition so I have started Glucerna with every meal

## 2018-01-05 ENCOUNTER — Encounter: Payer: Self-pay | Admitting: Internal Medicine

## 2018-01-10 ENCOUNTER — Encounter: Payer: Self-pay | Admitting: Internal Medicine

## 2018-01-10 ENCOUNTER — Non-Acute Institutional Stay (SKILLED_NURSING_FACILITY): Payer: Medicare Other | Admitting: Internal Medicine

## 2018-01-10 DIAGNOSIS — L03032 Cellulitis of left toe: Secondary | ICD-10-CM | POA: Diagnosis not present

## 2018-01-10 DIAGNOSIS — M10072 Idiopathic gout, left ankle and foot: Secondary | ICD-10-CM

## 2018-01-10 NOTE — Progress Notes (Signed)
:  Location[facility] Room Number: [phone] Place of Service:  SNF (31)  Greg Jones. Greg Hollingshead, MD  Patient Care Team: Deeann Saint, MD as PCP - General [facility] Medicine)  Extended Emergency Contact Information Primary Emergency Contact: [name] [place] of [place] Home Phone: [phone] Relation: Sister     Allergies: Other; Penicillins; and Vitamin b12  Chief Complaint  Patient presents with  . Acute Visit    L. great toe pain    HPI: Patient is 84 y.o. male who today for acute left great toe pain.  Onset yesterday.  MCP is red and tender with some heat with some fluctuance that is not really typical to gout patient admits that it feels like his usual gout.  Past Medical History:  Diagnosis Date  . CHF (congestive heart failure) (HCC)   . Chronic kidney disease   . COPD (chronic obstructive pulmonary disease) (HCC)   . GERD (gastroesophageal reflux disease)   . Hypertension   . Respiratory insufficiency     History reviewed. No pertinent surgical history.  Allergies as of 01/10/2018      Reactions   Other Other (See Comments)   Oxygen Sensitive   Penicillins Rash, Other (See Comments)   Any -illins as well as Mycins Has patient had a PCN reaction causing immediate rash, facial/tongue/throat swelling, SOB or lightheadedness with hypotension: Yes Has patient had a PCN reaction causing severe rash involving mucus membranes or skin necrosis: Yes Has patient had a PCN reaction that required hospitalization No Has patient had a PCN reaction occurring within the last 10 years: No If all of the above answers are "NO", then may proceed with Cephalosporin use.   Vitamin B12 Itching, Rash, Other (See Comments)   injections      Medication List        Accurate as of 01/10/18 12:42 PM. Always use your most recent med list.          albuterol (2.5 MG/3ML) 0.083% nebulizer solution Commonly known as:  PROVENTIL Take 3  mLs (2.5 mg total) by nebulization every 6 (six) hours. DX:  J44.9   albuterol 108 (90 Base) MCG/ACT inhaler Commonly known as:  PROVENTIL HFA;VENTOLIN HFA Inhale 2 puffs into the lungs every 6 (six) hours as needed for wheezing or shortness of breath.   aspirin EC 81 MG tablet Take 81 mg by mouth daily.   bacitracin-polymyxin b ointment Commonly known as:  POLYSPORIN Apply topically 2 (two) times daily. APPLY POLYSPORIN OINTMENT TOPICALLY TO ABDOMEN /WAISTLINE BID   budesonide-formoterol 160-4.5 MCG/ACT inhaler Commonly known as:  SYMBICORT Inhale 2 puffs into the lungs 2 (two) times daily.   calcitRIOL 0.25 MCG capsule Commonly known as:  ROCALTROL Take 0.25 mcg by mouth every other day.   camphor-menthol lotion Commonly known as:  SARNA Apply 1 application topically as needed for itching.   fluconazole 100 MG tablet Commonly known as:  DIFLUCAN Take 100 mg by mouth daily.   furosemide 20 MG tablet Commonly known as:  LASIX Take 20 mg by mouth. take 3 tabs (60 mg) po daily for CHF   GLUCERNA Liqd Take 237 mLs by mouth. GIVE 1 CAN (237 Ml) WITH EACH MEAL AS SUPPLEMENT   hydrOXYzine 25 MG tablet Commonly known as:  ATARAX/VISTARIL Take 25 mg by mouth every 6 (six) hours. Itching   insulin aspart 100 UNIT/ML injection Commonly known as:  novoLOG Inject 2-15 Units into the skin 3 (three) times  daily before meals. SQ: 121-150= 2 units, 151-200= 3 units, 201-250= 5 units, 251-300= 8 units, 301-350= 11 units, 351-400=15 units, Greater than 400=15 units and Call MD   ipratropium-albuterol 0.5-2.5 (3) MG/3ML Soln Commonly known as:  DUONEB Take 3 mLs by nebulization.   isosorbide mononitrate 30 MG 24 hr tablet Commonly known as:  IMDUR Take 30 mg by mouth daily.   LORazepam 0.5 MG tablet Commonly known as:  ATIVAN Take 0.5 mg by mouth 2 (two) times daily.   multivitamin with minerals tablet Take 1 tablet by mouth daily. Give 1 tablet by mouth qd to promote wound  healing   omeprazole 20 MG capsule Commonly known as:  PRILOSEC Take 20 mg by mouth daily.   ranitidine 75 MG tablet Commonly known as:  ZANTAC Take 75 mg by mouth 2 (two) times daily. Take 2 tablets (300 mg) by mouth daily for itching   triamcinolone cream 0.1 % Commonly known as:  KENALOG Apply 1 application topically. apply to affected areas two times daily. Apply to affected areas of trunk and extremities   ZYRTEC ALLERGY 10 MG tablet Generic drug:  cetirizine Take 10 mg by mouth daily.       No orders of the defined types were placed in this encounter.   Immunization History  Administered Date(s) Administered  . Influenza, High Dose Seasonal PF 05/06/2017  . Influenza,inj,Quad PF,6+ Mos 02/15/2016  . Influenza-Unspecified 05/03/2015  . Pneumococcal Conjugate-13 04/19/2016  . Pneumococcal Polysaccharide-23 09/18/2015    Social History   Tobacco Use  . Smoking status: Former Smoker    Packs/day: 0.50    Years: 70.00    Pack years: 35.00    Types: Cigarettes    Last attempt to quit: 04/19/2013    Years since quitting: 4.7  . Smokeless tobacco: Never Used  Substance Use Topics  . Alcohol use: No    Alcohol/week: 0.0 oz    Family history is   Family History  Problem Relation Age of Onset  . Tuberculosis Mother   . Diabetes Father       Review of Systems  DATA OBTAINED: from patient, nurse GENERAL:  no fevers, fatigue, appetite changes SKIN: No itching, or rash EYES: No eye pain, redness, discharge EARS: No earache, tinnitus, change in hearing NOSE: No congestion, drainage or bleeding  MOUTH/THROAT: No mouth or tooth pain, No sore throat RESPIRATORY: No cough, wheezing, SOB CARDIAC: No chest pain, palpitations, lower extremity edema  GI: No abdominal pain, No N/V/D or constipation, No heartburn or reflux  GU: No dysuria, frequency or urgency, or incontinence  MUSCULOSKELETAL: Left great MCP pain NEUROLOGIC: No headache, dizziness or focal  weakness PSYCHIATRIC: No c/o anxiety or sadness   Vitals:   01/10/18 1219  BP: 106/62  Pulse: 98  Resp: 20  Temp: (!) 96.7 F (35.9 C)  SpO2: 95%    SpO2 Readings from Last 1 Encounters:  01/10/18 95%   Body mass index is 25.56 kg/m.     Physical Exam  GENERAL APPEARANCE: Alert, conversant,  No acute distress.  SKIN: No diaphoresis rash HEAD: Normocephalic, atraumatic  EYES: Conjunctiva/lids clear. Pupils round, reactive. EOMs intact.  EARS: External exam WNL, canals clear. Hearing grossly normal.  NOSE: No deformity or discharge.  MOUTH/THROAT: Lips w/o lesions  RESPIRATORY: Breathing is even, unlabored. Lung sounds are clear   CARDIOVASCULAR: Heart RRR no murmurs, rubs or gallops. No peripheral edema.   GASTROINTESTINAL: Abdomen is soft, non-tender, not distended w/ normal bowel sounds. GENITOURINARY: Bladder  non tender, not distended  MUSCULOSKELETAL left: Great MCP- with swelling and heat and very tender however there is some area of fluctuance as well NEUROLOGIC:  Cranial nerves 2-12 grossly intact. Moves all extremities  PSYCHIATRIC: Mood and affect appropriate to situation, no behavioral issues  Patient Active Problem List   Diagnosis Date Noted  . Poor appetite 01/02/2018  . Acute on chronic respiratory failure with hypoxemia (HCC) 11/22/2017  . HCAP (healthcare-associated pneumonia) 11/22/2017  . Aspiration pneumonia (HCC) 11/22/2017  . Macular rash 11/22/2017  . COPD with acute exacerbation (HCC) 10/18/2017  . Type 2 diabetes mellitus with other specified complication (HCC) 10/12/2017  . Pruritic rash 08/14/2017  . Pain and swelling of toe of left foot 08/14/2017  . Pressure injury of skin 04/12/2016  . CKD (chronic kidney disease), stage IV (HCC) 04/11/2016  . QT prolongation 04/11/2016  . Hypokalemia 04/11/2016  . Chronic combined systolic and diastolic CHF (congestive heart failure) (HCC) 01/11/2016  . GERD (gastroesophageal reflux disease)  05/28/2015  . COPD (chronic obstructive pulmonary disease) (HCC) 05/10/2015  . Essential hypertension 05/10/2015      Labs reviewed: Basic Metabolic Panel:    Component Value Date/Time   NA 139 12/27/2017   NA 140 12/05/2017   K 4.5 12/27/2017   K 3.8 12/05/2017   CL 101 11/17/2017 0554   CO2 28 11/17/2017 0554   GLUCOSE 111 (H) 11/17/2017 0554   BUN 36 (A) 12/27/2017   CREATININE 2.7 (A) 12/27/2017   CREATININE 2.14 12/05/2017   CALCIUM 9.1 12/05/2017   PROT 6.8 11/12/2017 1612   ALBUMIN 3.2 (L) 11/12/2017 1612   AST 53 (A) 12/27/2017   ALT 32 12/27/2017   ALKPHOS 270 (A) 12/27/2017   BILITOT 0.5 11/12/2017 1612   GFRNONAA 33 (L) 11/17/2017 0554   GFRAA 30.83 12/05/2017    Recent Labs    10/18/17 1611  10/20/17 0517  11/12/17 1029  11/15/17 0547 11/16/17 0553 11/17/17 0554  11/28/17 12/05/17 12/27/17  NA 147*   < > 140   < > 143   < > 136 139 139   < > 140 140 139  K 3.5   < > 4.0   < > 3.0*   < > 4.5 4.6 4.8   < > 3.9 3.8 4.5  CL 103   < > 99*   < > 99*   < > 100* 100* 101  --   --   --   --   CO2 27   < > 26   < > 30   < > 26 32 28  --   --   --   --   GLUCOSE 131*   < > 178*   < > 146*   < > 159* 123* 111*  --   --   --   --   BUN 22*   < > 37*   < > 33*   < > 59* 57* 48*   < > 36* 17 36*  CREATININE 2.47*   < > 2.30*   < > 1.98*   < > 2.40* 2.12* 1.77*   < > 2.0* 2.14 2.7*  CALCIUM 9.9   < > 9.5   < > 9.2   < > 9.0 9.0 9.4  --   --  9.1  --   MG 2.2  --  2.6*  --  2.0  --   --   --   --   --   --   --   --    < > =  values in this interval not displayed.   Liver Function Tests: Recent Labs    08/21/17 0543 11/12/17 1029 11/12/17 1612 12/27/17  AST 34 37 41 53*  ALT 67* 56 57 32  ALKPHOS 51 76 73 270*  BILITOT 0.6 0.6 0.5  --   PROT 5.7* 7.2 6.8  --   ALBUMIN 2.8* 3.4* 3.2*  --    No results for input(s): LIPASE, AMYLASE in the last 8760 hours. No results for input(s): AMMONIA in the last 8760 hours. CBC: Recent Labs    11/15/17 0547  11/16/17 0553 11/17/17 0554 11/22/17 12/10/17 12/27/17  WBC 11.9* 12.6* 15.7* 12.4 7.9 12.2  NEUTROABS 9.8* 10.0 12.7*  --   --   --   HGB 11.9* 11.9* 12.0* 11.4* 11.2 10.6*  HCT 37.4* 37.7* 37.5* 36* 35.2 34*  MCV 84.0 84.3 83.1  --  81.2  --   PLT 246 245 266 238  --  404*   Lipid Recent Labs    12/27/17  CHOL 195  HDL 30*  LDLCALC 115  TRIG 252*    Cardiac Enzymes: Recent Labs    08/14/17 2207 08/15/17 0746 08/15/17 1045  TROPONINI 0.03* 0.03* 0.03*   BNP: Recent Labs    08/14/17 1717 10/18/17 1611 11/12/17 1029  BNP 96.7 147.6* 134.9*   No results found for: MICROALBUR Lab Results  Component Value Date   HGBA1C 8.0 12/27/2017   Lab Results  Component Value Date   TSH 0.689 11/13/2017   No results found for: VITAMINB12 No results found for: FOLATE No results found for: IRON, TIBC, FERRITIN  Imaging and Procedures obtained prior to SNF admission: Dg Chest 2 View  Result Date: 11/12/2017 CLINICAL DATA:  Short of breath with exertion. EXAM: CHEST - 2 VIEW COMPARISON:  10/21/2017 FINDINGS: Cardiomegaly. Mild vascular congestion. No Kerley B lines. Mild patchy airspace disease in the right mid and lower lung zones. No pneumothorax or pleural effusion. IMPRESSION: Patchy airspace disease in the right mid and lower lung. Followup PA and lateral chest X-ray is recommended in 3-4 weeks following trial of antibiotic therapy to ensure resolution and exclude underlying malignancy. Cardiomegaly and vascular congestion without pulmonary edema. Electronically Signed   By: Jolaine Click M.D.   On: 11/12/2017 10:42     Not all labs, radiology exams or other studies done during hospitalization come through on my EPIC note; however they are reviewed by me.    Assessment and Plan  Gout left great MCP plus/minus cellulitis- patient is on allopurinol 200 mg daily we will treat acutely with colchicine 1.2 mg now and 0.6 mg in 1 hour; will start colchicine 0.6 mg daily set up  with [facility] podiatry; start doxycycline 100 mg twice daily for 7 days    Thurston Hole D. Greg Hollingshead, MD

## 2018-01-14 LAB — BASIC METABOLIC PANEL
BUN: 50 — AB (ref 4–21)
CREATININE: 3.9 — AB (ref 0.6–1.3)
Glucose: 132
POTASSIUM: 3.9 (ref 3.4–5.3)
Sodium: 150 — AB (ref 137–147)

## 2018-01-15 ENCOUNTER — Encounter: Payer: Self-pay | Admitting: Adult Health

## 2018-01-15 ENCOUNTER — Non-Acute Institutional Stay (SKILLED_NURSING_FACILITY): Payer: Medicare Other | Admitting: Adult Health

## 2018-01-15 DIAGNOSIS — N179 Acute kidney failure, unspecified: Secondary | ICD-10-CM | POA: Diagnosis not present

## 2018-01-15 DIAGNOSIS — N184 Chronic kidney disease, stage 4 (severe): Secondary | ICD-10-CM | POA: Diagnosis not present

## 2018-01-15 NOTE — Progress Notes (Signed)
Location:   [facility] Room Number: 92308 W Place of Service:  SNF (31)   CODE STATUS: Full Code  Allergies  Allergen Reactions  . Other Other (See Comments)    Oxygen Sensitive  . Penicillins Rash and Other (See Comments)    Any -illins as well as Mycins Has patient had a PCN reaction causing immediate rash, facial/tongue/throat swelling, SOB or lightheadedness with hypotension: Yes Has patient had a PCN reaction causing severe rash involving mucus membranes or skin necrosis: Yes Has patient had a PCN reaction that required hospitalization No Has patient had a PCN reaction occurring within the last 10 years: No If all of the above answers are "NO", then may proceed with Cephalosporin use.   . Vitamin B12 Itching, Rash and Other (See Comments)    injections    Chief Complaint  Patient presents with  . Acute Visit    Lab follow up    HPI:   he has stage IV chronic kidney failure; combined heart failure and copd. He requires use of 02. He is unable to fully participate in the hpi or ros. His renal failure has gotten worse; and have been holding his lasix through this coming Thursday. He is presently on daily weight to help monitor for fluid overload. There are no reports of fevers; he does have a poor appetite. There are no reports of worsening shortness of breath.   Past Medical History:  Diagnosis Date  . CHF (congestive heart failure) (HCC)   . Chronic kidney disease   . COPD (chronic obstructive pulmonary disease) (HCC)   . GERD (gastroesophageal reflux disease)   . Hypertension   . Respiratory insufficiency     History reviewed. No pertinent surgical history.  Social History   Socioeconomic History  . Marital status: Widowed    Spouse name: Not on file  . Number of children: Not on file  . Years of education: Not on file  . Highest education level: Not on file  Occupational History  . Not on file  Social Needs  . Financial  resource strain: Not on file  . Food insecurity:    Worry: Not on file    Inability: Not on file  . Transportation needs:    Medical: Not on file    Non-medical: Not on file  Tobacco Use  . Smoking status: Former Smoker    Packs/day: 0.50    Years: 70.00    Pack years: 35.00    Types: Cigarettes    Last attempt to quit: 04/19/2013    Years since quitting: 4.7  . Smokeless tobacco: Never Used  Substance and Sexual Activity  . Alcohol use: No    Alcohol/week: 0.0 oz  . Drug use: No  . Sexual activity: Never  Lifestyle  . Physical activity:    Days per week: Not on file    Minutes per session: Not on file  . Stress: Not on file  Relationships  . Social connections:    Talks on phone: Not on file    Gets together: Not on file    Attends religious service: Not on file    Active member of club or organization: Not on file    Attends meetings of clubs or organizations: Not on file    Relationship status: Not on file  . Intimate partner violence:    Fear of current or ex partner: Not on file    Emotionally abused: Not on file  Physically abused: Not on file    Forced sexual activity: Not on file  Other Topics Concern  . Not on file  Social History Narrative   Widowed.     Former smoker, quit in 2014.   Ambulates with a rollator.     Family History  Problem Relation Age of Onset  . Tuberculosis Mother   . Diabetes Father       VITAL SIGNS BP (!) 145/83   Pulse 100   Temp (!) 97 F (36.1 C)   Resp (!) 22   Ht 5\' 8"  (1.727 m)   Wt 168 lb 1.6 oz (76.2 kg)   SpO2 95%   BMI 25.56 kg/m   Outpatient Encounter Medications as of 01/15/2018  Medication Sig  . albuterol (PROVENTIL HFA;VENTOLIN HFA) 108 (90 Base) MCG/ACT inhaler Inhale 2 puffs into the lungs every 6 (six) hours as needed for wheezing or shortness of breath.  [name] albuterol (PROVENTIL) (2.5 MG/3ML) 0.083% nebulizer solution Take 3 mLs (2.5 mg total) by nebulization every 6 (six) hours. DX:  J44.9  .  allopurinol (ZYLOPRIM) 100 MG tablet Take 100 mg by mouth daily. X 1 week then beginning on 01/18/18 give 2 tablets (200 mg) by mouth daily  . bacitracin-polymyxin b (POLYSPORIN) ointment Apply topically 2 (two) times daily. APPLY POLYSPORIN OINTMENT TOPICALLY TO ABDOMEN /WAISTLINE BID  . budesonide-formoterol (SYMBICORT) 160-4.5 MCG/ACT inhaler Inhale 2 puffs into the lungs 2 (two) times daily.  . calcitRIOL (ROCALTROL) 0.25 MCG capsule Take 0.25 mcg by mouth every other day.  . camphor-menthol (SARNA) lotion Apply 1 application topically as needed for itching.  . cetirizine (ZYRTEC ALLERGY) 10 MG tablet Take 10 mg by mouth daily.  . colchicine 0.6 MG tablet Take 0.6 mg by mouth daily.  [name] doxycycline (DORYX) 100 MG EC tablet Take 100 mg by mouth 2 (two) times daily. X 7 days  . fluconazole (DIFLUCAN) 100 MG tablet Take 100 mg by mouth daily.  . furosemide (LASIX) 20 MG tablet Take 20 mg by mouth. take 3 tabs (60 mg) po daily for CHF  . GLUCERNA (GLUCERNA) LIQD Take 237 mLs by mouth 3 (three) times daily between meals. GIVE 1 CAN (237 Ml) WITH EACH MEAL AS SUPPLEMENT   . hydrOXYzine (ATARAX/VISTARIL) 25 MG tablet Take 25 mg by mouth every 6 (six) hours. Itching  . insulin aspart (NOVOLOG) 100 UNIT/ML injection Inject 2-15 Units into the skin 3 (three) times daily before meals. SQ: 121-150= 2 units, 151-200= 3 units, 201-250= 5 units, 251-300= 8 units, 301-350= 11 units, 351-400=15 units, Greater than 400=15 units and Call MD  . ipratropium-albuterol (DUONEB) 0.5-2.5 (3) MG/3ML SOLN Take 3 mLs by nebulization 2 (two) times daily.   . isosorbide mononitrate (IMDUR) 30 MG 24 hr tablet Take 30 mg by mouth daily.  [name] LORazepam (ATIVAN) 0.5 MG tablet Take 0.5 mg by mouth 2 (two) times daily.  . Multiple Vitamins-Minerals (MULTIVITAMIN WITH MINERALS) tablet Take 1 tablet by mouth daily. Give 1 tablet by mouth qd to promote wound healing  . omeprazole (PRILOSEC) 20 MG capsule Take 20 mg by mouth daily.  .  ranitidine (ZANTAC) 75 MG tablet Take 75 mg by mouth 2 (two) times daily. Take 2 tablets (300 mg) by mouth daily for itching  . senna (SENOKOT) 8.6 MG TABS tablet Take 2 tablets by mouth every morning.  . triamcinolone cream (KENALOG) 0.1 % Apply 1 application topically. apply to affected areas two times daily. Apply to affected areas of trunk and  extremities  . aspirin EC 81 MG tablet Take 81 mg by mouth daily.   No facility-administered encounter medications on file as of 01/15/2018.      SIGNIFICANT DIAGNOSTIC EXAMS  TODAY;   01-12-18: bilateral lower extremity arterial ultrasound: no occlusion; probable stenosis left common femoral artery limited study.    LABS REVIEWED TODAY:   01-13-18: glucose 79; bun 23.5; creat 2.25; k+ 4.3; na++ 140; ca 8.9 01-14-18: glucose 139; bun 50; creat 4.96; k+ 3.9; na++ 150; ca 9.2  Review of Systems  Unable to perform ROS: Medical condition    Physical Exam  Constitutional: No distress.  Frail   Neck: No thyromegaly present.  Cardiovascular: Normal rate, regular rhythm, normal heart sounds and intact distal pulses.  Pulmonary/Chest: Effort normal and breath sounds normal. No respiratory distress.  Using 02   Abdominal: Soft. Bowel sounds are normal. He exhibits no distension. There is no tenderness.  Musculoskeletal: Normal range of motion. He exhibits no edema.  Lymphadenopathy:    He has no cervical adenopathy.  Neurological:  Is aware  Skin: Skin is warm and dry. He is not diaphoretic.       ASSESSMENT/ PLAN:  TODAY:   1. Acuate renal failure superimposed on stage 4 chronic kidney disease: is worse; will begin D5W at 80 cc per hour for 2 liters and will repeat bmp on Thursday.    MD is aware of resident's narcotic use and is in agreement with current plan of care. We will attempt to wean resident as apropriate   Synthia Innocent NP [facility]  Contact [phone] Monday through Friday 8am- 5pm  After hours call  [phone]

## 2018-01-17 ENCOUNTER — Encounter (HOSPITAL_COMMUNITY): Payer: Self-pay | Admitting: Emergency Medicine

## 2018-01-17 ENCOUNTER — Other Ambulatory Visit: Payer: Self-pay

## 2018-01-17 ENCOUNTER — Inpatient Hospital Stay (HOSPITAL_COMMUNITY): Payer: Medicare Other

## 2018-01-17 ENCOUNTER — Emergency Department (HOSPITAL_COMMUNITY): Payer: Medicare Other

## 2018-01-17 ENCOUNTER — Inpatient Hospital Stay (HOSPITAL_COMMUNITY)
Admission: EM | Admit: 2018-01-17 | Discharge: 2018-02-17 | DRG: 871 | Disposition: E | Payer: Medicare Other | Source: Skilled Nursing Facility | Attending: Internal Medicine | Admitting: Internal Medicine

## 2018-01-17 DIAGNOSIS — R21 Rash and other nonspecific skin eruption: Secondary | ICD-10-CM

## 2018-01-17 DIAGNOSIS — Z66 Do not resuscitate: Secondary | ICD-10-CM | POA: Diagnosis present

## 2018-01-17 DIAGNOSIS — Z7189 Other specified counseling: Secondary | ICD-10-CM | POA: Diagnosis not present

## 2018-01-17 DIAGNOSIS — L89610 Pressure ulcer of right heel, unstageable: Secondary | ICD-10-CM | POA: Diagnosis present

## 2018-01-17 DIAGNOSIS — R000 Tachycardia, unspecified: Secondary | ICD-10-CM | POA: Diagnosis present

## 2018-01-17 DIAGNOSIS — K7200 Acute and subacute hepatic failure without coma: Secondary | ICD-10-CM | POA: Diagnosis present

## 2018-01-17 DIAGNOSIS — J440 Chronic obstructive pulmonary disease with acute lower respiratory infection: Secondary | ICD-10-CM | POA: Diagnosis present

## 2018-01-17 DIAGNOSIS — G92 Toxic encephalopathy: Secondary | ICD-10-CM | POA: Diagnosis present

## 2018-01-17 DIAGNOSIS — G9341 Metabolic encephalopathy: Secondary | ICD-10-CM | POA: Diagnosis not present

## 2018-01-17 DIAGNOSIS — E1122 Type 2 diabetes mellitus with diabetic chronic kidney disease: Secondary | ICD-10-CM | POA: Diagnosis present

## 2018-01-17 DIAGNOSIS — L8991 Pressure ulcer of unspecified site, stage 1: Secondary | ICD-10-CM

## 2018-01-17 DIAGNOSIS — E861 Hypovolemia: Secondary | ICD-10-CM | POA: Diagnosis present

## 2018-01-17 DIAGNOSIS — A419 Sepsis, unspecified organism: Secondary | ICD-10-CM

## 2018-01-17 DIAGNOSIS — J15212 Pneumonia due to Methicillin resistant Staphylococcus aureus: Secondary | ICD-10-CM | POA: Diagnosis present

## 2018-01-17 DIAGNOSIS — Z831 Family history of other infectious and parasitic diseases: Secondary | ICD-10-CM

## 2018-01-17 DIAGNOSIS — N179 Acute kidney failure, unspecified: Secondary | ICD-10-CM | POA: Diagnosis present

## 2018-01-17 DIAGNOSIS — E875 Hyperkalemia: Secondary | ICD-10-CM | POA: Diagnosis present

## 2018-01-17 DIAGNOSIS — J9691 Respiratory failure, unspecified with hypoxia: Secondary | ICD-10-CM | POA: Diagnosis not present

## 2018-01-17 DIAGNOSIS — Z7952 Long term (current) use of systemic steroids: Secondary | ICD-10-CM

## 2018-01-17 DIAGNOSIS — I130 Hypertensive heart and chronic kidney disease with heart failure and stage 1 through stage 4 chronic kidney disease, or unspecified chronic kidney disease: Secondary | ICD-10-CM | POA: Diagnosis present

## 2018-01-17 DIAGNOSIS — A4102 Sepsis due to Methicillin resistant Staphylococcus aureus: Principal | ICD-10-CM | POA: Diagnosis present

## 2018-01-17 DIAGNOSIS — G929 Unspecified toxic encephalopathy: Secondary | ICD-10-CM

## 2018-01-17 DIAGNOSIS — N170 Acute kidney failure with tubular necrosis: Secondary | ICD-10-CM | POA: Diagnosis not present

## 2018-01-17 DIAGNOSIS — Z79899 Other long term (current) drug therapy: Secondary | ICD-10-CM

## 2018-01-17 DIAGNOSIS — Z515 Encounter for palliative care: Secondary | ICD-10-CM | POA: Diagnosis not present

## 2018-01-17 DIAGNOSIS — N183 Chronic kidney disease, stage 3 (moderate): Secondary | ICD-10-CM | POA: Diagnosis not present

## 2018-01-17 DIAGNOSIS — K828 Other specified diseases of gallbladder: Secondary | ICD-10-CM | POA: Diagnosis present

## 2018-01-17 DIAGNOSIS — N184 Chronic kidney disease, stage 4 (severe): Secondary | ICD-10-CM | POA: Diagnosis present

## 2018-01-17 DIAGNOSIS — N400 Enlarged prostate without lower urinary tract symptoms: Secondary | ICD-10-CM | POA: Diagnosis present

## 2018-01-17 DIAGNOSIS — Z888 Allergy status to other drugs, medicaments and biological substances status: Secondary | ICD-10-CM

## 2018-01-17 DIAGNOSIS — Z7951 Long term (current) use of inhaled steroids: Secondary | ICD-10-CM

## 2018-01-17 DIAGNOSIS — I5042 Chronic combined systolic (congestive) and diastolic (congestive) heart failure: Secondary | ICD-10-CM | POA: Diagnosis present

## 2018-01-17 DIAGNOSIS — E1169 Type 2 diabetes mellitus with other specified complication: Secondary | ICD-10-CM | POA: Diagnosis present

## 2018-01-17 DIAGNOSIS — L89620 Pressure ulcer of left heel, unstageable: Secondary | ICD-10-CM | POA: Diagnosis present

## 2018-01-17 DIAGNOSIS — M109 Gout, unspecified: Secondary | ICD-10-CM | POA: Diagnosis present

## 2018-01-17 DIAGNOSIS — K839 Disease of biliary tract, unspecified: Secondary | ICD-10-CM | POA: Diagnosis not present

## 2018-01-17 DIAGNOSIS — Z880 Allergy status to penicillin: Secondary | ICD-10-CM | POA: Diagnosis not present

## 2018-01-17 DIAGNOSIS — R740 Nonspecific elevation of levels of transaminase and lactic acid dehydrogenase [LDH]: Secondary | ICD-10-CM

## 2018-01-17 DIAGNOSIS — I10 Essential (primary) hypertension: Secondary | ICD-10-CM | POA: Diagnosis present

## 2018-01-17 DIAGNOSIS — K6289 Other specified diseases of anus and rectum: Secondary | ICD-10-CM

## 2018-01-17 DIAGNOSIS — R7401 Elevation of levels of liver transaminase levels: Secondary | ICD-10-CM

## 2018-01-17 DIAGNOSIS — R7881 Bacteremia: Secondary | ICD-10-CM

## 2018-01-17 DIAGNOSIS — R4182 Altered mental status, unspecified: Secondary | ICD-10-CM | POA: Diagnosis not present

## 2018-01-17 DIAGNOSIS — I5030 Unspecified diastolic (congestive) heart failure: Secondary | ICD-10-CM | POA: Diagnosis not present

## 2018-01-17 DIAGNOSIS — E860 Dehydration: Secondary | ICD-10-CM | POA: Diagnosis present

## 2018-01-17 DIAGNOSIS — J449 Chronic obstructive pulmonary disease, unspecified: Secondary | ICD-10-CM | POA: Diagnosis not present

## 2018-01-17 DIAGNOSIS — I739 Peripheral vascular disease, unspecified: Secondary | ICD-10-CM | POA: Diagnosis not present

## 2018-01-17 DIAGNOSIS — N189 Chronic kidney disease, unspecified: Secondary | ICD-10-CM | POA: Diagnosis not present

## 2018-01-17 DIAGNOSIS — K219 Gastro-esophageal reflux disease without esophagitis: Secondary | ICD-10-CM | POA: Diagnosis present

## 2018-01-17 DIAGNOSIS — R062 Wheezing: Secondary | ICD-10-CM

## 2018-01-17 DIAGNOSIS — R6521 Severe sepsis with septic shock: Secondary | ICD-10-CM

## 2018-01-17 DIAGNOSIS — B9562 Methicillin resistant Staphylococcus aureus infection as the cause of diseases classified elsewhere: Secondary | ICD-10-CM | POA: Diagnosis not present

## 2018-01-17 DIAGNOSIS — E872 Acidosis: Secondary | ICD-10-CM | POA: Diagnosis present

## 2018-01-17 DIAGNOSIS — L89623 Pressure ulcer of left heel, stage 3: Secondary | ICD-10-CM

## 2018-01-17 DIAGNOSIS — L89613 Pressure ulcer of right heel, stage 3: Secondary | ICD-10-CM

## 2018-01-17 DIAGNOSIS — Z794 Long term (current) use of insulin: Secondary | ICD-10-CM

## 2018-01-17 DIAGNOSIS — I714 Abdominal aortic aneurysm, without rupture, unspecified: Secondary | ICD-10-CM

## 2018-01-17 DIAGNOSIS — Z7982 Long term (current) use of aspirin: Secondary | ICD-10-CM

## 2018-01-17 DIAGNOSIS — Z87891 Personal history of nicotine dependence: Secondary | ICD-10-CM

## 2018-01-17 DIAGNOSIS — E870 Hyperosmolality and hypernatremia: Secondary | ICD-10-CM | POA: Diagnosis present

## 2018-01-17 DIAGNOSIS — Z833 Family history of diabetes mellitus: Secondary | ICD-10-CM

## 2018-01-17 DIAGNOSIS — R109 Unspecified abdominal pain: Secondary | ICD-10-CM | POA: Diagnosis not present

## 2018-01-17 DIAGNOSIS — R0602 Shortness of breath: Secondary | ICD-10-CM

## 2018-01-17 LAB — CBC WITH DIFFERENTIAL/PLATELET
Basophils Absolute: 0.1 10*3/uL (ref 0.0–0.1)
Basophils Relative: 1 %
EOS PCT: 0 %
Eosinophils Absolute: 0 10*3/uL (ref 0.0–0.7)
HCT: 41.2 % (ref 39.0–52.0)
Hemoglobin: 12.8 g/dL — ABNORMAL LOW (ref 13.0–17.0)
Lymphocytes Relative: 9 %
Lymphs Abs: 1.3 10*3/uL (ref 0.7–4.0)
MCH: 25.8 pg — ABNORMAL LOW (ref 26.0–34.0)
MCHC: 31.1 g/dL (ref 30.0–36.0)
MCV: 83.1 fL (ref 78.0–100.0)
MONO ABS: 0.6 10*3/uL (ref 0.1–1.0)
Monocytes Relative: 4 %
Neutro Abs: 12.9 10*3/uL — ABNORMAL HIGH (ref 1.7–7.7)
Neutrophils Relative %: 86 %
Platelets: 387 10*3/uL (ref 150–400)
RBC: 4.96 MIL/uL (ref 4.22–5.81)
RDW: 19 % — ABNORMAL HIGH (ref 11.5–15.5)
WBC: 14.9 10*3/uL — AB (ref 4.0–10.5)

## 2018-01-17 LAB — URINALYSIS, ROUTINE W REFLEX MICROSCOPIC
BILIRUBIN URINE: NEGATIVE
Glucose, UA: NEGATIVE mg/dL
Ketones, ur: NEGATIVE mg/dL
Leukocytes, UA: NEGATIVE
NITRITE: NEGATIVE
PH: 5 (ref 5.0–8.0)
Protein, ur: NEGATIVE mg/dL
SPECIFIC GRAVITY, URINE: 1.014 (ref 1.005–1.030)

## 2018-01-17 LAB — I-STAT CG4 LACTIC ACID, ED
Lactic Acid, Venous: 6.93 mmol/L (ref 0.5–1.9)
Lactic Acid, Venous: 5.43 mmol/L (ref 0.5–1.9)

## 2018-01-17 LAB — COMPREHENSIVE METABOLIC PANEL
ALT: 59 U/L — ABNORMAL HIGH (ref 0–44)
AST: 80 U/L — AB (ref 15–41)
Albumin: 2.5 g/dL — ABNORMAL LOW (ref 3.5–5.0)
Alkaline Phosphatase: 259 U/L — ABNORMAL HIGH (ref 38–126)
Anion gap: 23 — ABNORMAL HIGH (ref 5–15)
BUN: 82 mg/dL — AB (ref 8–23)
CHLORIDE: 114 mmol/L — AB (ref 98–111)
CO2: 23 mmol/L (ref 22–32)
Calcium: 9.5 mg/dL (ref 8.9–10.3)
Creatinine, Ser: 6.02 mg/dL — ABNORMAL HIGH (ref 0.61–1.24)
GFR, EST AFRICAN AMERICAN: 9 mL/min — AB (ref >60)
GFR, EST NON AFRICAN AMERICAN: 7 mL/min — AB (ref >60)
Glucose, Bld: 231 mg/dL — ABNORMAL HIGH (ref 70–99)
Potassium: 6 mmol/L — ABNORMAL HIGH (ref 3.5–5.1)
Sodium: 160 mmol/L — ABNORMAL HIGH (ref 135–145)
Total Bilirubin: 0.7 mg/dL (ref 0.3–1.2)
Total Protein: 8 g/dL (ref 6.5–8.1)

## 2018-01-17 LAB — TSH: TSH: 2.72 u[IU]/mL (ref 0.350–4.500)

## 2018-01-17 LAB — GLUCOSE, CAPILLARY: GLUCOSE-CAPILLARY: 162 mg/dL — AB (ref 70–99)

## 2018-01-17 LAB — BASIC METABOLIC PANEL
Anion gap: 16 — ABNORMAL HIGH (ref 5–15)
BUN: 80 mg/dL — AB (ref 8–23)
CHLORIDE: 120 mmol/L — AB (ref 98–111)
CO2: 22 mmol/L (ref 22–32)
CREATININE: 5.71 mg/dL — AB (ref 0.61–1.24)
Calcium: 8.4 mg/dL — ABNORMAL LOW (ref 8.9–10.3)
GFR calc non Af Amer: 8 mL/min — ABNORMAL LOW (ref >60)
GFR, EST AFRICAN AMERICAN: 9 mL/min — AB (ref >60)
Glucose, Bld: 192 mg/dL — ABNORMAL HIGH (ref 70–99)
Potassium: 5.5 mmol/L — ABNORMAL HIGH (ref 3.5–5.1)
SODIUM: 158 mmol/L — AB (ref 135–145)

## 2018-01-17 LAB — LACTIC ACID, PLASMA
Lactic Acid, Venous: 3.3 mmol/L (ref 0.5–1.9)
Lactic Acid, Venous: 3.8 mmol/L (ref 0.5–1.9)

## 2018-01-17 LAB — LIPASE, BLOOD: LIPASE: 21 U/L (ref 11–51)

## 2018-01-17 LAB — MRSA PCR SCREENING: MRSA by PCR: POSITIVE — AB

## 2018-01-17 MED ORDER — SODIUM CHLORIDE 0.9 % IV SOLN
2.0000 g | Freq: Once | INTRAVENOUS | Status: AC
  Administered 2018-01-17: 2 g via INTRAVENOUS
  Filled 2018-01-17: qty 2

## 2018-01-17 MED ORDER — SODIUM CHLORIDE 0.9 % IV SOLN
10.0000 mg | Freq: Two times a day (BID) | INTRAVENOUS | Status: DC
  Administered 2018-01-17 – 2018-01-20 (×7): 10 mg via INTRAVENOUS
  Filled 2018-01-17 (×9): qty 1

## 2018-01-17 MED ORDER — METRONIDAZOLE IN NACL 5-0.79 MG/ML-% IV SOLN
500.0000 mg | Freq: Three times a day (TID) | INTRAVENOUS | Status: DC
  Administered 2018-01-17 – 2018-01-21 (×11): 500 mg via INTRAVENOUS
  Filled 2018-01-17 (×11): qty 100

## 2018-01-17 MED ORDER — TIOTROPIUM BROMIDE MONOHYDRATE 18 MCG IN CAPS
18.0000 ug | ORAL_CAPSULE | RESPIRATORY_TRACT | Status: DC
  Filled 2018-01-17: qty 5

## 2018-01-17 MED ORDER — COLCHICINE 0.6 MG PO TABS: 0.6000 mg | ORAL_TABLET | Freq: Every day | ORAL | Status: DC

## 2018-01-17 MED ORDER — SODIUM CHLORIDE 0.9 % IV BOLUS
1000.0000 mL | Freq: Once | INTRAVENOUS | Status: AC
  Administered 2018-01-17: 1000 mL via INTRAVENOUS

## 2018-01-17 MED ORDER — CLEVIDIPINE BUTYRATE 0.5 MG/ML IV EMUL
0.0000 mg/h | INTRAVENOUS | Status: DC
  Filled 2018-01-17: qty 50

## 2018-01-17 MED ORDER — LORAZEPAM 0.5 MG PO TABS: 0.5000 mg | ORAL_TABLET | Freq: Two times a day (BID) | ORAL | Status: DC

## 2018-01-17 MED ORDER — FENTANYL CITRATE (PF) 100 MCG/2ML IJ SOLN
12.5000 ug | INTRAMUSCULAR | Status: DC | PRN
  Administered 2018-01-17 – 2018-01-18 (×5): 12.5 ug via INTRAVENOUS
  Filled 2018-01-17 (×5): qty 2

## 2018-01-17 MED ORDER — ALBUTEROL SULFATE (2.5 MG/3ML) 0.083% IN NEBU
2.5000 mg | INHALATION_SOLUTION | Freq: Four times a day (QID) | RESPIRATORY_TRACT | Status: DC
  Administered 2018-01-18 – 2018-01-22 (×18): 2.5 mg via RESPIRATORY_TRACT
  Filled 2018-01-17 (×18): qty 3

## 2018-01-17 MED ORDER — FAMOTIDINE IN NACL 20-0.9 MG/50ML-% IV SOLN: 20.0000 mg | Freq: Two times a day (BID) | INTRAVENOUS | Status: DC

## 2018-01-17 MED ORDER — LORATADINE 10 MG PO TABS: 10.0000 mg | ORAL_TABLET | Freq: Every day | ORAL | Status: DC

## 2018-01-17 MED ORDER — LABETALOL HCL 5 MG/ML IV SOLN
INTRAVENOUS | Status: AC
  Administered 2018-01-17: 21:00:00
  Filled 2018-01-17: qty 4

## 2018-01-17 MED ORDER — MORPHINE SULFATE (PF) 4 MG/ML IV SOLN
4.0000 mg | Freq: Once | INTRAVENOUS | Status: AC
  Administered 2018-01-17: 4 mg via INTRAVENOUS
  Filled 2018-01-17: qty 1

## 2018-01-17 MED ORDER — ALBUTEROL SULFATE (2.5 MG/3ML) 0.083% IN NEBU
2.5000 mg | INHALATION_SOLUTION | RESPIRATORY_TRACT | Status: DC | PRN
  Filled 2018-01-17 (×2): qty 3

## 2018-01-17 MED ORDER — INSULIN ASPART 100 UNIT/ML ~~LOC~~ SOLN: 0.0000 [IU] | Freq: Every day | SUBCUTANEOUS | Status: DC

## 2018-01-17 MED ORDER — SODIUM CHLORIDE 0.45 % IV SOLN
INTRAVENOUS | Status: DC
  Administered 2018-01-17 – 2018-01-18 (×3): via INTRAVENOUS

## 2018-01-17 MED ORDER — INSULIN ASPART 100 UNIT/ML ~~LOC~~ SOLN
0.0000 [IU] | Freq: Three times a day (TID) | SUBCUTANEOUS | Status: DC
  Administered 2018-01-19: 1 [IU] via SUBCUTANEOUS
  Administered 2018-01-19 (×2): 2 [IU] via SUBCUTANEOUS
  Administered 2018-01-20: 1 [IU] via SUBCUTANEOUS

## 2018-01-17 MED ORDER — IPRATROPIUM-ALBUTEROL 0.5-2.5 (3) MG/3ML IN SOLN: 3.0000 mL | Freq: Two times a day (BID) | RESPIRATORY_TRACT | Status: DC

## 2018-01-17 MED ORDER — CALCITRIOL 0.25 MCG PO CAPS: 0.2500 ug | ORAL_CAPSULE | ORAL | Status: DC

## 2018-01-17 MED ORDER — ONDANSETRON HCL 4 MG PO TABS: 4.0000 mg | ORAL_TABLET | Freq: Four times a day (QID) | ORAL | Status: DC | PRN

## 2018-01-17 MED ORDER — METRONIDAZOLE IN NACL 5-0.79 MG/ML-% IV SOLN
500.0000 mg | Freq: Once | INTRAVENOUS | Status: AC
  Administered 2018-01-17: 500 mg via INTRAVENOUS
  Filled 2018-01-17: qty 100

## 2018-01-17 MED ORDER — ONDANSETRON HCL 4 MG/2ML IJ SOLN: 4.0000 mg | Freq: Four times a day (QID) | INTRAMUSCULAR | Status: DC | PRN

## 2018-01-17 MED ORDER — DEXTROSE 5 % IV SOLN
500.0000 mg | INTRAVENOUS | Status: DC
  Administered 2018-01-18: 500 mg via INTRAVENOUS
  Filled 2018-01-17: qty 0.5

## 2018-01-17 MED ORDER — LABETALOL HCL 5 MG/ML IV SOLN
10.0000 mg | INTRAVENOUS | Status: DC | PRN
  Administered 2018-01-17: 20 mg via INTRAVENOUS

## 2018-01-17 MED ORDER — COLCHICINE 0.6 MG PO TABS: 0.3000 mg | ORAL_TABLET | Freq: Every day | ORAL | Status: DC

## 2018-01-17 MED ORDER — HEPARIN SODIUM (PORCINE) 5000 UNIT/ML IJ SOLN
5000.0000 [IU] | Freq: Three times a day (TID) | INTRAMUSCULAR | Status: DC
  Administered 2018-01-17 – 2018-01-22 (×14): 5000 [IU] via SUBCUTANEOUS
  Filled 2018-01-17 (×15): qty 1

## 2018-01-17 MED ORDER — MOMETASONE FURO-FORMOTEROL FUM 200-5 MCG/ACT IN AERO
2.0000 | INHALATION_SPRAY | Freq: Two times a day (BID) | RESPIRATORY_TRACT | Status: DC
  Filled 2018-01-17: qty 8.8

## 2018-01-17 MED ORDER — LABETALOL HCL 5 MG/ML IV SOLN: 10.0000 mg | INTRAVENOUS | Status: DC | PRN

## 2018-01-17 MED ORDER — ALBUTEROL SULFATE (2.5 MG/3ML) 0.083% IN NEBU: 2.5000 mg | INHALATION_SOLUTION | Freq: Four times a day (QID) | RESPIRATORY_TRACT | Status: DC

## 2018-01-17 NOTE — Procedures (Signed)
Arterial Catheter Insertion Procedure Note Greg RobertsJonathan Jones [id number] 04/27/1928  Procedure: Insertion of Arterial Catheter  Indications: Blood pressure monitoring  Procedure Details Consent: Unable to obtain consent because of emergent medical necessity. and patient unresponsive and confused Time Out: Verified patient identification, verified procedure, site/side was marked, verified correct patient position, special equipment/implants available, medications/allergies/relevent history reviewed, required imaging and test results available.  Performed  Maximum sterile technique was used including antiseptics, cap, gloves, gown, hand hygiene, mask and sheet. Skin prep: Chlorhexidine; local anesthetic administered 20 gauge catheter was inserted into right radial artery using the Seldinger technique. ULTRASOUND GUIDANCE USED: NO Evaluation Blood flow good; BP tracing good. Complications: No apparent complications.   Greg Jones P 02/15/2018

## 2018-01-17 NOTE — Progress Notes (Signed)
CRITICAL VALUE ALERT  Critical Value: Lactic Acid 3.8  Date & Time Notied:  01/28/2018, 2127  Provider Notified: Dr. Bruna PotterBlount   Orders Received/Actions taken: Awaiting orders

## 2018-01-17 NOTE — ED Triage Notes (Signed)
[facility] unsure of chief complaints. Abdomen tender upon palpation. Reports that patient was given 2000ml of fluids before arriving.

## 2018-01-17 NOTE — ED Notes (Signed)
CONDOM CATH PUT IN PLACE TO COLLECT SAMPLE

## 2018-01-17 NOTE — Progress Notes (Signed)
eLink Physician-Brief Progress Note Patient Name: Greg RobertsJonathan Jones DOB: 10/13/1927 MRN: [id number]   Date of Service  02/05/2018  HPI/Events of Note  Pt with non-invasively recorded BP of 245/121 [facility]. [facility] not formally consulted.  eICU Interventions  Labetalol 40 mg iv Q 2 hrs prn SBP > 170, arterial line for reliable BP monitoring, Clevidipine was ordered but discontinued prior to administration due to prompt BP response to Labetalol.        Thomasene Lotkoronkwo U Lyann Hagstrom 02/10/2018, 10:57 PM

## 2018-01-17 NOTE — ED Notes (Signed)
SISTER (GUARDIAN) [phone]... [name] [employer]

## 2018-01-17 NOTE — Progress Notes (Signed)
Pharmacy Antibiotic Note  Greg RobertsJonathan Stettler is a 84 y.o. male admitted on 02/13/2018 with abdominal pain.  Pharmacy has been consulted for Cefepime dosing.  Plan: 8/1 Abd CT: Note incr size AAA -Vasc surg consult, diverticulosis.  8/1: No cardiopulm dz on CXray Cefepime 2gm x1, then 500mg  q24 SCr 6.02 gm/dl with Cl < 10 ml/min     Temp (24hrs), Avg:98 F (36.7 C), Min:97 F (36.1 C), Max:99 F (37.2 C)  Recent Labs  Lab 01/14/18 02/13/2018 1217 02/10/2018 1220 01/21/2018 1413  WBC  --  14.9*  --   --   CREATININE 3.9* 6.02*  --   --   LATICACIDVEN  --   --  6.93* 5.43*    Estimated Creatinine Clearance: 8.2 mL/min (A) (by C-G formula based on SCr of 6.02 mg/dL (H)).    Allergies  Allergen Reactions  . Other Other (See Comments)    Oxygen Sensitive  . Penicillins Rash and Other (See Comments)    Any -illins as well as Mycins Has patient had a PCN reaction causing immediate rash, facial/tongue/throat swelling, SOB or lightheadedness with hypotension: Yes Has patient had a PCN reaction causing severe rash involving mucus membranes or skin necrosis: Yes Has patient had a PCN reaction that required hospitalization No Has patient had a PCN reaction occurring within the last 10 years: No If all of the above answers are "NO", then may proceed with Cephalosporin use.   . Vitamin B12 Itching, Rash and Other (See Comments)    injections   Antimicrobials this admission: 8/1 Cefepime >>  8/1 Metronidazole x1  Dose adjustments this admission:  Microbiology results: 8/1 BCx: sent 8/1 UCx: sent   Thank you for allowing pharmacy to be a part of this patient's care.  Otho BellowsGreen, Neria Procter L PharmD Pager [phone] 01/31/2018, 5:24 PM

## 2018-01-17 NOTE — ED Notes (Signed)
ED TO INPATIENT HANDOFF REPORT  Name/Age/Gender Greg Jones 84 y.o. male  Code Status    Code Status Orders  (From admission, onward)        Start     Ordered   01/27/2018 1717  Full code  Continuous     01/22/2018 1716    Code Status History    Date Active Date Inactive Code Status Order ID Comments User Context   11/12/2017 1510 11/17/2017 1842 Full Code [id number]  Edwin Dada, MD ED   10/18/2017 2315 10/25/2017 2105 Full Code [id number]  Karmen Bongo, MD Inpatient   08/14/2017 2209 08/22/2017 1627 Full Code [id number]  Norval Morton, MD ED   09/07/2016 1820 09/11/2016 1721 Full Code [id number]  Dixie Dials, MD Inpatient   04/11/2016 1850 04/12/2016 1950 Full Code [id number]  Florencia Reasons, MD Inpatient   03/15/2016 1839 03/18/2016 1539 Full Code [id number]  Dixie Dials, MD Inpatient   09/07/2015 1554 09/14/2015 1712 Full Code [id number]  Parrett, Fonnie Mu, NP Inpatient      Home/SNF/Other Home  Chief Complaint AMS / possibly septic   Level of Care/Admitting Diagnosis ED Disposition    ED Disposition Condition Comment   [facility]: [facility] [[id number]]  Level of Care: Stepdown [14]  Admit to SDU based on following criteria: Hemodynamic compromise or significant risk of instability:  Patient requiring short term acute titration and management of vasoactive drips, and invasive monitoring (i.e., CVP and Arterial line).  Diagnosis: Sepsis [facility]) [[id number]]  Admitting Physician: Elodia Florence [phone]  Attending Physician: Cephus Slater, A CALDWELL [phone]  Estimated length of stay: past midnight tomorrow  Certification:: I certify this patient will need inpatient services for at least 2 midnights  PT Class (Do Not Modify): Inpatient [101]  PT Acc Code (Do Not Modify): Private [1]       Medical History Past Medical History:  Diagnosis Date  . CHF (congestive heart failure) ([place])   . Chronic kidney disease   . COPD (chronic obstructive  pulmonary disease) ([place])   . GERD (gastroesophageal reflux disease)   . Hypertension   . Respiratory insufficiency     Allergies Allergies  Allergen Reactions  . Other Other (See Comments)    Oxygen Sensitive  . Penicillins Rash and Other (See Comments)    Any -illins as well as Mycins Has patient had a PCN reaction causing immediate rash, facial/tongue/throat swelling, SOB or lightheadedness with hypotension: Yes Has patient had a PCN reaction causing severe rash involving mucus membranes or skin necrosis: Yes Has patient had a PCN reaction that required hospitalization No Has patient had a PCN reaction occurring within the last 10 years: No If all of the above answers are "NO", then may proceed with Cephalosporin use.   . Vitamin B12 Itching, Rash and Other (See Comments)    injections    IV Location/Drains/Wounds Patient Lines/Drains/Airways Status   Active Line/Drains/Airways    Name:   Placement date:   Placement time:   Site:   Days:   Peripheral IV 02/13/2018 Left Hand   02/04/2018    1146    Hand   less than 1          Labs/Imaging Results for orders placed or performed during the hospital encounter of 01/26/2018 (from the past 48 hour(s))  CBC with Differential     Status: Abnormal   Collection Time: 01/19/2018 12:17 PM  Result Value Ref Range   WBC 14.9 (H) 4.0 -  10.5 K/uL   RBC 4.96 4.22 - 5.81 MIL/uL   Hemoglobin 12.8 (L) 13.0 - 17.0 g/dL   HCT 41.2 39.0 - 52.0 %   MCV 83.1 78.0 - 100.0 fL   MCH 25.8 (L) 26.0 - 34.0 pg   MCHC 31.1 30.0 - 36.0 g/dL   RDW 19.0 (H) 11.5 - 15.5 %   Platelets 387 150 - 400 K/uL   Neutrophils Relative % 86 %   Lymphocytes Relative 9 %   Monocytes Relative 4 %   Eosinophils Relative 0 %   Basophils Relative 1 %   Neutro Abs 12.9 (H) 1.7 - 7.7 K/uL   Lymphs Abs 1.3 0.7 - 4.0 K/uL   Monocytes Absolute 0.6 0.1 - 1.0 K/uL   Eosinophils Absolute 0.0 0.0 - 0.7 K/uL   Basophils Absolute 0.1 0.0 - 0.1 K/uL   WBC Morphology MILD LEFT  SHIFT (1-5% METAS, OCC MYELO, OCC BANDS)     Comment: Performed at [facility], [place] [address] [place], [place] [address]  Comprehensive metabolic panel     Status: Abnormal   Collection Time: 02/06/2018 12:17 PM  Result Value Ref Range   Sodium 160 (H) 135 - 145 mmol/L    Comment: REPEATED TO VERIFY   Potassium 6.0 (H) 3.5 - 5.1 mmol/L    Comment: NO VISIBLE HEMOLYSIS   Chloride 114 (H) 98 - 111 mmol/L    Comment: REPEATED TO VERIFY   CO2 23 22 - 32 mmol/L    Comment: REPEATED TO VERIFY   Glucose, Bld 231 (H) 70 - 99 mg/dL   BUN 82 (H) 8 - 23 mg/dL   Creatinine, Ser 6.02 (H) 0.61 - 1.24 mg/dL   Calcium 9.5 8.9 - 10.3 mg/dL   Total Protein 8.0 6.5 - 8.1 g/dL   Albumin 2.5 (L) 3.5 - 5.0 g/dL   AST 80 (H) 15 - 41 U/L   ALT 59 (H) 0 - 44 U/L   Alkaline Phosphatase 259 (H) 38 - 126 U/L   Total Bilirubin 0.7 0.3 - 1.2 mg/dL   GFR calc non Af Amer 7 (L) >60 mL/min   GFR calc Af Amer 9 (L) >60 mL/min    Comment: (NOTE) The eGFR has been calculated using the CKD EPI equation. This calculation has not been validated in all clinical situations. eGFR's persistently <60 mL/min signify possible Chronic Kidney Disease.    Anion gap 23 (H) 5 - 15    Comment: REPEATED TO VERIFY Performed at [facility] [place] [address], [place], [place] [address]   Lipase, blood     Status: None   Collection Time: 01/27/2018 12:17 PM  Result Value Ref Range   Lipase 21 11 - 51 U/L    Comment: Performed at [facility], [place] [address] [place], [place] [address]  I-Stat CG4 Lactic Acid, ED     Status: Abnormal   Collection Time: 02/03/2018 12:20 PM  Result Value Ref Range   Lactic Acid, Venous 6.93 (HH) 0.5 - 1.9 mmol/L   Comment NOTIFIED PHYSICIAN   I-Stat CG4 Lactic Acid, ED     Status: Abnormal   Collection Time: 02/14/2018  2:13 PM  Result Value Ref Range   Lactic Acid, Venous 5.43 (HH) 0.5 - 1.9 mmol/L   Comment NOTIFIED PHYSICIAN   Urinalysis,  Routine w reflex microscopic     Status: Abnormal   Collection Time: 02/04/2018  3:07 PM  Result Value Ref Range   Color, Urine  AMBER (A) YELLOW    Comment: BIOCHEMICALS MAY BE AFFECTED BY COLOR   APPearance CLEAR CLEAR   Specific Gravity, Urine 1.014 1.005 - 1.030   pH 5.0 5.0 - 8.0   Glucose, UA NEGATIVE NEGATIVE mg/dL   Hgb urine dipstick SMALL (A) NEGATIVE   Bilirubin Urine NEGATIVE NEGATIVE   Ketones, ur NEGATIVE NEGATIVE mg/dL   Protein, ur NEGATIVE NEGATIVE mg/dL   Nitrite NEGATIVE NEGATIVE   Leukocytes, UA NEGATIVE NEGATIVE   RBC / HPF 0-5 0 - 5 RBC/hpf   WBC, UA 0-5 0 - 5 WBC/hpf   Bacteria, UA FEW (A) NONE SEEN   Squamous Epithelial / LPF 0-5 0 - 5   Mucus PRESENT    Hyaline Casts, UA PRESENT     Comment: Performed at [facility], [place] [address] [place], [place] [address]   Ct Abdomen Pelvis Wo Contrast  Result Date: 01/21/2018 CLINICAL DATA:  Abdominal pain. Clinical concern for gastroenteritis or colitis. EXAM: CT ABDOMEN AND PELVIS WITHOUT CONTRAST TECHNIQUE: Multidetector CT imaging of the abdomen and pelvis was performed following the standard protocol without IV contrast. COMPARISON:  Abdomen and pelvis radiographs dated 11/17/2017 and abdomen and pelvis CT dated 02/07/2016. Abdomen ultrasound dated 08/19/2017. FINDINGS: Lower chest: Interval dependent atelectasis in the right lower lobe. Hepatobiliary: Mild sludge in the gallbladder. Normal appearing liver. Pancreas: Diffuse atrophy with partial fatty replacement. Spleen: Normal in size without focal abnormality. Adrenals/Urinary Tract: Adrenal glands are unremarkable. Kidneys are normal, without renal calculi, focal lesion, or hydronephrosis. Bladder is unremarkable. Stomach/Bowel: Multiple right colon diverticula without evidence of diverticulitis. Interval mild diffuse rectal wall thickening. Normal appearing appendix, stomach and small bowel. Vascular/Lymphatic: Atheromatous arterial calcifications. And  wrist small dilatation of the proximal infrarenal abdominal aorta with a more prominent focal out pouching posterolaterally on the left. The maximum total diameter, including the focal outpouching, is 4.2 cm, previously 3.6 cm. The focal outpouching is rounded and measures 1.5 cm in maximum diameter on coronal image number 57 and 1.6 cm in maximum diameter on axial image number 29 series 2. Also demonstrated is mild aneurysmal dilatation of the left common iliac artery with a maximum AP diameter 1.8 cm on image number 52 series 2. No enlarged lymph nodes. Reproductive: Mildly and enlarged prostate gland containing coarse calcifications. Other: Small bilateral inguinal hernias containing fat. Musculoskeletal: Bilateral hip degenerative changes. Lumbar and lower thoracic spine degenerative changes. IMPRESSION: 1. 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 The maximum combined diameter is 4.2 cm, increased from 3.6 cm. Recommend vascular surgery consultation due to the increased size of the more focal rounded outpouching, currently measuring 1.6 cm. 2. 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 3. Interval mild diffuse rectal wall thickening, suggesting mild proctitis. Right colon diverticulosis without evidence of diverticulitis. 4. Mild sludge in the gallbladder. 5. Diffuse pancreatic atrophy. 6. Right lower lobe dependent atelectasis. Electronically Signed   By: Claudie Revering M.D.   On: 02/11/2018 13:45   Dg Chest 2 View  Result Date: 01/27/2018 CLINICAL DATA:  Mental status change EXAM: CHEST - 2 VIEW COMPARISON:  11/17/2017 FINDINGS: The heart size and mediastinal contours are within normal limits. Both lungs are clear. The visualized skeletal structures are unremarkable. IMPRESSION: No active cardiopulmonary disease. Electronically Signed   By: Franchot Gallo M.D.    On: 02/12/2018 12:59    Pending Labs  Unresulted Labs (From admission, onward)   Start     Ordered   01/18/18 0500  Comprehensive metabolic panel  Tomorrow morning,   R     01/25/2018 1716   01/18/18 0500  CBC  Tomorrow morning,   R     02/08/2018 1716   01/18/18 0500  Magnesium  Tomorrow morning,   R     02/06/2018 1716   02/13/2018 1721  Lactic acid, plasma  STAT Now then every 3 hours,   R     01/28/2018 1720   02/02/2018 1717  TSH  Add-on,   R     02/04/2018 1716   02/14/2018 2620  Basic metabolic panel  STAT,   R     02/16/2018 1706   02/07/2018 1347  Urine culture  STAT,   STAT     02/04/2018 1347   02/03/2018 1135  Culture, blood (routine x 2)  BLOOD CULTURE X 2,   STAT     01/20/2018 1134      Vitals/Pain Today's Vitals   02/02/2018 1530 01/24/2018 1600 02/04/2018 1630 02/07/2018 1700  BP: 138/70 (!) 137/54 (!) 153/93 (!) 139/91  Pulse: (!) 107 (!) 109 (!) 107 (!) 107  Resp: (!) 23 (!) 30 (!) 25 (!) 25  Temp:      TempSrc:      SpO2: 100% 100% 100% 100%  PainSc:        Isolation Precautions No active isolations  Medications Medications  metroNIDAZOLE (FLAGYL) IVPB 500 mg (has no administration in time range)  albuterol (PROVENTIL) (2.5 MG/3ML) 0.083% nebulizer solution 2.5 mg (has no administration in time range)  mometasone-formoterol (DULERA) 200-5 MCG/ACT inhaler 2 puff (has no administration in time range)  famotidine (PEPCID) IVPB 20 mg premix (has no administration in time range)  tiotropium (SPIRIVA) inhalation capsule 18 mcg (has no administration in time range)  insulin aspart (novoLOG) injection 0-9 Units (has no administration in time range)  insulin aspart (novoLOG) injection 0-5 Units (has no administration in time range)  heparin injection 5,000 Units (has no administration in time range)  fentaNYL (SUBLIMAZE) injection 12.5 mcg (has no administration in time range)  ondansetron (ZOFRAN) tablet 4 mg (has no administration in time range)    Or  ondansetron (ZOFRAN) injection 4  mg (has no administration in time range)  0.45 % sodium chloride infusion ( Intravenous New Bag/Given 01/29/2018 1743)  ceFEPIme (MAXIPIME) 500 mg in dextrose 5 % 50 mL IVPB (has no administration in time range)  albuterol (PROVENTIL) (2.5 MG/3ML) 0.083% nebulizer solution 2.5 mg (has no administration in time range)  morphine 4 MG/ML injection 4 mg (4 mg Intravenous Given 02/09/2018 1223)  sodium chloride 0.9 % bolus 1,000 mL (0 mLs Intravenous Stopped 02/05/2018 1450)  ceFEPIme (MAXIPIME) 2 g in sodium chloride 0.9 % 100 mL IVPB (0 g Intravenous Stopped 02/03/2018 1445)    And  metroNIDAZOLE (FLAGYL) IVPB 500 mg (0 mg Intravenous Stopped 02/04/2018 1458)  sodium chloride 0.9 % bolus 1,000 mL ( Intravenous Stopped 02/10/2018 1713)    Mobility non-ambulatory

## 2018-01-17 NOTE — Progress Notes (Signed)
CRITICAL VALUE ALERT  Critical Value:  Lactic Acid 3.3  Date & Time Notied: 01/21/2018, 1853  Provider Notified: A. Lowell GuitarPowell MD  Orders Received/Actions taken: Awaiting orders

## 2018-01-17 NOTE — ED Notes (Signed)
Bed: WA17 Expected date:  Expected time:  Means of arrival:  Comments: 84 yo m abdominal pain

## 2018-01-17 NOTE — ED Provider Notes (Signed)
[facility]-EMERGENCY DEPT Provider Note   CSN: [id number] Arrival date & time: Feb 03, 2018  1113     History   Chief Complaint Chief Complaint  Patient presents with  . Abdominal Pain    HPI Greg Jones is a 84 y.o. male.  Pt presents to the ED today with abdominal pain.  EMS was not sure of initial complaint, but they did say pt received 2L saline via IV.  Pt is normally awake and conversant, but is not now.   He has seen dermatology for rash, and there is a note from them in his paperwork from [facility].  They said to continue with atarax for itching and hydrocortisone cream.  No biopsy done yet.  Pt is getting oral doxy?  Unclear why.     Past Medical History:  Diagnosis Date  . CHF (congestive heart failure) (HCC)   . Chronic kidney disease   . COPD (chronic obstructive pulmonary disease) (HCC)   . GERD (gastroesophageal reflux disease)   . Hypertension   . Respiratory insufficiency     Patient Active Problem List   Diagnosis Date Noted  . Poor appetite 01/02/2018  . Acute on chronic respiratory failure with hypoxemia (HCC) 11/22/2017  . HCAP (healthcare-associated pneumonia) 11/22/2017  . Aspiration pneumonia (HCC) 11/22/2017  . Macular rash 11/22/2017  . COPD with acute exacerbation (HCC) 10/18/2017  . Type 2 diabetes mellitus with other specified complication (HCC) 10/12/2017  . Pruritic rash 08/14/2017  . Pain and swelling of toe of left foot 08/14/2017  . Pressure injury of skin 04/12/2016  . CKD (chronic kidney disease), stage IV (HCC) 04/11/2016  . QT prolongation 04/11/2016  . Hypokalemia 04/11/2016  . Chronic combined systolic and diastolic CHF (congestive heart failure) (HCC) 01/11/2016  . GERD (gastroesophageal reflux disease) 05/28/2015  . COPD (chronic obstructive pulmonary disease) (HCC) 05/10/2015  . Essential hypertension 05/10/2015    History reviewed. No pertinent surgical history.      Home Medications    Prior to  Admission medications   Medication Sig Start Date End Date Taking? Authorizing Provider  albuterol (PROVENTIL) (2.5 MG/3ML) 0.083% nebulizer solution Take 3 mLs (2.5 mg total) by nebulization every 6 (six) hours. DX:  J44.9 03/23/17  Yes Oretha Milch, MD  allopurinol (ZYLOPRIM) 100 MG tablet Take 100 mg by mouth daily. X 1 week then beginning on 01/18/18 give 2 tablets (200 mg) by mouth daily 01/10/18 03-Feb-2018 Yes [provider]  amLODipine (NORVASC) 10 MG tablet Take 10 mg by mouth daily.   Yes [provider]  aspirin EC 81 MG tablet Take 81 mg by mouth daily.   Yes [provider]  budesonide-formoterol (SYMBICORT) 160-4.5 MCG/ACT inhaler Inhale 2 puffs into the lungs 2 (two) times daily.   Yes [provider]  calcitRIOL (ROCALTROL) 0.25 MCG capsule Take 0.25 mcg by mouth every other day.   Yes [provider]  cetirizine (ZYRTEC ALLERGY) 10 MG tablet Take 10 mg by mouth daily.   Yes [provider]  colchicine 0.6 MG tablet Take 0.6 mg by mouth daily. 01/11/18  Yes [provider]  doxycycline (DORYX) 100 MG EC tablet Take 100 mg by mouth 2 (two) times daily. X 7 days 01/11/18 2018/02/03 Yes [provider]  fluconazole (DIFLUCAN) 100 MG tablet Take 100 mg by mouth daily.   Yes [provider]  furosemide (LASIX) 20 MG tablet Take 20 mg by mouth. take 3 tabs (60 mg) po daily for CHF  Yes [provider]  GLUCERNA (GLUCERNA) LIQD Take 237 mLs by mouth 3 (three) times daily between meals. GIVE 1 CAN (237 Ml) WITH EACH MEAL AS SUPPLEMENT    Yes [provider]  hydrOXYzine (ATARAX/VISTARIL) 25 MG tablet Take 25 mg by mouth every 6 (six) hours. Itching   Yes [provider]  insulin aspart (NOVOLOG) 100 UNIT/ML injection Inject 2-15 Units into the skin 3 (three) times daily before meals. SQ: 121-150= 2 units, 151-200= 3 units, 201-250= 5 units, 251-300= 8 units, 301-350= 11 units, 351-400=15 units,  Greater than 400=15 units and Call MD   Yes [provider]  ipratropium-albuterol (DUONEB) 0.5-2.5 (3) MG/3ML SOLN Take 3 mLs by nebulization 2 (two) times daily.    Yes [provider]  isosorbide mononitrate (IMDUR) 30 MG 24 hr tablet Take 30 mg by mouth daily.   Yes [provider]  LORazepam (ATIVAN) 0.5 MG tablet Take 0.5 mg by mouth 2 (two) times daily.   Yes [provider]  Multiple Vitamins-Minerals (MULTIVITAMIN WITH MINERALS) tablet Take 1 tablet by mouth daily. Give 1 tablet by mouth qd to promote wound healing   Yes [provider]  omeprazole (PRILOSEC) 20 MG capsule Take 20 mg by mouth daily.   Yes [provider]  ranitidine (ZANTAC) 75 MG tablet Take 75 mg by mouth 2 (two) times daily. Take 2 tablets (300 mg) by mouth daily for itching   Yes [provider]  senna (SENOKOT) 8.6 MG TABS tablet Take 2 tablets by mouth every morning.   Yes [provider]  tiotropium (SPIRIVA) 18 MCG inhalation capsule Place 18 mcg into inhaler and inhale daily.   Yes [provider]  triamcinolone cream (KENALOG) 0.1 % Apply 1 application topically. apply to affected areas two times daily. Apply to affected areas of trunk and extremities   Yes [provider]    Family History Family History  Problem Relation Age of Onset  . Tuberculosis Mother   . Diabetes Father     Social History Social History   Tobacco Use  . Smoking status: Former Smoker    Packs/day: 0.50    Years: 70.00    Pack years: 35.00    Types: Cigarettes    Last attempt to quit: 04/19/2013    Years since quitting: 4.7  . Smokeless tobacco: Never Used  Substance Use Topics  . Alcohol use: No    Alcohol/week: 0.0 oz  . Drug use: No     Allergies   Other; Penicillins; and Vitamin b12   Review of Systems Review of Systems  Unable to perform ROS: Mental status change  All other systems reviewed and are  negative.    Physical Exam Updated Vital Signs BP (!) 117/92   Pulse (!) 107   Temp 99 F (37.2 C) (Rectal)   Resp (!) 23   SpO2 100%   Physical Exam  Constitutional: He appears ill.  HENT:  Head: Normocephalic and atraumatic.  Mouth/Throat: Oropharynx is clear and moist.  Eyes: Pupils are equal, round, and reactive to light. EOM are normal.  Cardiovascular: Regular rhythm. Tachycardia present.  Abdominal: Normal appearance. There is generalized tenderness.  Musculoskeletal: Normal range of motion.  Neurological: He is alert.  Skin: Skin is warm. Rash noted.  Red, sloughing rash See picture.  Psychiatric:  Unable to assess  Nursing note and vitals reviewed.       ED Treatments / Results  Labs (all labs ordered are listed, but  only abnormal results are displayed) Labs Reviewed  CBC WITH DIFFERENTIAL/PLATELET - Abnormal; Notable for the following components:      Result Value   WBC 14.9 (*)    Hemoglobin 12.8 (*)    MCH 25.8 (*)    RDW 19.0 (*)    Neutro Abs 12.9 (*)    All other components within normal limits  COMPREHENSIVE METABOLIC PANEL - Abnormal; Notable for the following components:   Sodium 160 (*)    Potassium 6.0 (*)    Chloride 114 (*)    Glucose, Bld 231 (*)    BUN 82 (*)    Creatinine, Ser 6.02 (*)    Albumin 2.5 (*)    AST 80 (*)    ALT 59 (*)    Alkaline Phosphatase 259 (*)    GFR calc non Af Amer 7 (*)    GFR calc Af Amer 9 (*)    Anion gap 23 (*)    All other components within normal limits  URINALYSIS, ROUTINE W REFLEX MICROSCOPIC - Abnormal; Notable for the following components:   Color, Urine AMBER (*)    Hgb urine dipstick SMALL (*)    Bacteria, UA FEW (*)    All other components within normal limits  I-STAT CG4 LACTIC ACID, ED - Abnormal; Notable for the following components:   Lactic Acid, Venous 6.93 (*)    All other components within normal limits  I-STAT CG4 LACTIC ACID, ED - Abnormal; Notable for the following components:    Lactic Acid, Venous 5.43 (*)    All other components within normal limits  CULTURE, BLOOD (ROUTINE X 2)  CULTURE, BLOOD (ROUTINE X 2)  URINE CULTURE  LIPASE, BLOOD    EKG EKG Interpretation  Date/Time:  Thursday February 11, 2018 11:43:44 EDT Ventricular Rate:  117 PR Interval:    QRS Duration: 97 QT Interval:  335 QTC Calculation: 458 R Axis:   -46 Text Interpretation:  Paired ventricular premature complexes Low voltage, extremity leads Consider inferior infarct Borderline ST depression, inferior leads Abnormal T, consider ischemia, diffuse leads ST elevation, consider lateral injury Poor data quality pt is tremulous No significant change since last tracing Confirmed by Jacalyn Lefevre [phone]) on 11-Feb-2018 12:16:23 PM   Radiology Ct Abdomen Pelvis Wo Contrast  Result Date: 02/11/18 CLINICAL DATA:  Abdominal pain. Clinical concern for gastroenteritis or colitis. EXAM: CT ABDOMEN AND PELVIS WITHOUT CONTRAST TECHNIQUE: Multidetector CT imaging of the abdomen and pelvis was performed following the standard protocol without IV contrast. COMPARISON:  Abdomen and pelvis radiographs dated 11/17/2017 and abdomen and pelvis CT dated 02/07/2016. Abdomen ultrasound dated 08/19/2017. FINDINGS: Lower chest: Interval dependent atelectasis in the right lower lobe. Hepatobiliary: Mild sludge in the gallbladder. Normal appearing liver. Pancreas: Diffuse atrophy with partial fatty replacement. Spleen: Normal in size without focal abnormality. Adrenals/Urinary Tract: Adrenal glands are unremarkable. Kidneys are normal, without renal calculi, focal lesion, or hydronephrosis. Bladder is unremarkable. Stomach/Bowel: Multiple right colon diverticula without evidence of diverticulitis. Interval mild diffuse rectal wall thickening. Normal appearing appendix, stomach and small bowel. Vascular/Lymphatic: Atheromatous arterial calcifications. And wrist small dilatation of the proximal infrarenal abdominal aorta with a  more prominent focal out pouching posterolaterally on the left. The maximum total diameter, including the focal outpouching, is 4.2 cm, previously 3.6 cm. The focal outpouching is rounded and measures 1.5 cm in maximum diameter on coronal image number 57 and 1.6 cm in maximum diameter on axial image number 29 series 2. Also demonstrated is mild aneurysmal dilatation  of the left common iliac artery with a maximum AP diameter 1.8 cm on image number 52 series 2. No enlarged lymph nodes. Reproductive: Mildly and enlarged prostate gland containing coarse calcifications. Other: Small bilateral inguinal hernias containing fat. Musculoskeletal: Bilateral hip degenerative changes. Lumbar and lower thoracic spine degenerative changes. IMPRESSION: 1. 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 The maximum combined diameter is 4.2 cm, increased from 3.6 cm. Recommend vascular surgery consultation due to the increased size of the more focal rounded outpouching, currently measuring 1.6 cm. 2. 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 3. Interval mild diffuse rectal wall thickening, suggesting mild proctitis. Right colon diverticulosis without evidence of diverticulitis. 4. Mild sludge in the gallbladder. 5. Diffuse pancreatic atrophy. 6. Right lower lobe dependent atelectasis. Electronically Signed   By: Beckie Salts M.D.   On: Feb 01, 2018 13:45   Dg Chest 2 View  Result Date: 02/01/2018 CLINICAL DATA:  Mental status change EXAM: CHEST - 2 VIEW COMPARISON:  11/17/2017 FINDINGS: The heart size and mediastinal contours are within normal limits. Both lungs are clear. The visualized skeletal structures are unremarkable. IMPRESSION: No active cardiopulmonary disease. Electronically Signed   By: Marlan Palau M.D.   On: 2018-02-01 12:59    Procedures Procedures (including critical  care time)  Medications Ordered in ED Medications  sodium chloride 0.9 % bolus 1,000 mL (has no administration in time range)  morphine 4 MG/ML injection 4 mg (4 mg Intravenous Given 2018/02/01 1223)  sodium chloride 0.9 % bolus 1,000 mL (0 mLs Intravenous Stopped 02-01-18 1450)  ceFEPIme (MAXIPIME) 2 g in sodium chloride 0.9 % 100 mL IVPB (0 g Intravenous Stopped 02-01-2018 1445)    And  metroNIDAZOLE (FLAGYL) IVPB 500 mg (0 mg Intravenous Stopped 02/01/2018 1458)     Initial Impression / Assessment and Plan / ED Course  I have reviewed the triage vital signs and the nursing notes.  Pertinent labs & imaging results that were available during my care of the patient were reviewed by me and considered in my medical decision making (see chart for details).    Pt not immediately given fluids upon arrival here b/c he received 2L pta.  He does have a hx of CHF.  After cxr clear, he was given 2L NS.  IV maxipime and flagyl given for proctitis.  CRITICAL CARE Performed by: Jacalyn Lefevre   Total critical care time: 60 minutes  Critical care time was exclusive of separately billable procedures and treating other patients.  Critical care was necessary to treat or prevent imminent or life-threatening deterioration.  Critical care was time spent personally by me on the following activities: development of treatment plan with patient and/or surrogate as well as nursing, discussions with consultants, evaluation of patient's response to treatment, examination of patient, obtaining history from patient or surrogate, ordering and performing treatments and interventions, ordering and review of laboratory studies, ordering and review of radiographic studies, pulse oximetry and re-evaluation of patient's condition.  Pt d/w Dr. Lowell Guitar (triad) for admission.   Final Clinical Impressions(s) / ED Diagnoses   Final diagnoses:  Acute renal failure, unspecified acute renal failure type (HCC)  Hypernatremia  Proctitis   Abdominal aortic aneurysm (AAA) without rupture (HCC)  Acute metabolic encephalopathy  Rash and nonspecific skin eruption    ED Discharge Orders    None       Jacalyn Lefevre,  MD 01/27/2018 1545

## 2018-01-17 NOTE — ED Notes (Signed)
Patient transported to CT 

## 2018-01-17 NOTE — H&P (Addendum)
History and Physical    Greg RobertsJonathan Jones NFA:[id number] DOB: 07/31/1927 DOA: 02/16/2018  PCP: Margit HanksAlexander, Anne D, MD  [facility]  I have personally briefly [facility]  Chief Complaint: abdominal pain  HPI: Greg RobertsJonathan Jones is Greg Jones 84 y.o. male with medical history significant of CKD, COPD, HTN, GERD presenting with sepsis with abdominal pain with CT evidence of proctitis, dehydration, acute kidney injury, and hypernatremia.  History is limited due to the patient's mental status.  Discussed with staff who note he's been declining over past several days.  His lasix was stopped due to abnormal labs on 7/30.  Today, he was noted to have pain on abdominal exam and was tachycardic.  He was given 2 L bolus and sent to ED.  His sister notes that she was with him yesterday.  He was talking then, but quiet, difficult to make sense of what he was saying.  He has Greg Jones chronic rash since April/May.  He's followed up with dermatology for this and has pending follow up for consideration of biopsy.  Triamcinolone seems to have made this worse.  Staff notes that he's had poor PO intake over the past 2 months, but seems worse recently.  ED Course: 2 L NS, labs, EKG, imaging, antibiotics.  Admit for sepsis, dehydration, hypernatremia, AKI, proctitis.  Review of Systems: Unable to perform due to pt mental status  Past Medical History:  Diagnosis Date  . CHF (congestive heart failure) (HCC)   . Chronic kidney disease   . COPD (chronic obstructive pulmonary disease) (HCC)   . GERD (gastroesophageal reflux disease)   . Hypertension   . Respiratory insufficiency     History reviewed. No pertinent surgical history.   reports that he quit smoking about 4 years ago. His smoking use included cigarettes. He has Greg Jones 35.00 pack-year smoking history. He has never used smokeless tobacco. He reports that he does not drink alcohol or use drugs.  Allergies    Allergen Reactions  . Other Other (See Comments)    Oxygen Sensitive  . Penicillins Rash and Other (See Comments)    Any -illins as well as Mycins Has patient had Kennedie Pardoe PCN reaction causing immediate rash, facial/tongue/throat swelling, SOB or lightheadedness with hypotension: Yes Has patient had Quetzaly Ebner PCN reaction causing severe rash involving mucus membranes or skin necrosis: Yes Has patient had Jametta Moorehead PCN reaction that required hospitalization No Has patient had Marylyn Appenzeller PCN reaction occurring within the last 10 years: No If all of the above answers are "NO", then may proceed with Cephalosporin use.   . Vitamin B12 Itching, Rash and Other (See Comments)    injections    Family History  Problem Relation Age of Onset  . Tuberculosis Mother   . Diabetes Father    Prior to Admission medications   Medication Sig Start Date End Date Taking? Authorizing Provider  albuterol (PROVENTIL) (2.5 MG/3ML) 0.083% nebulizer solution Take 3 mLs (2.5 mg total) by nebulization every 6 (six) hours. DX:  J44.9 03/23/17  Yes Oretha MilchAlva, Rakesh V, MD  allopurinol (ZYLOPRIM) 100 MG tablet Take 100 mg by mouth daily. X 1 week then beginning on 01/18/18 give 2 tablets (200 mg) by mouth daily 01/10/18 01/24/2018 Yes [provider]  amLODipine (NORVASC) 10 MG tablet Take 10 mg by mouth daily.   Yes [provider]  aspirin EC 81 MG tablet Take 81 mg by mouth daily.   Yes [provider]  budesonide-formoterol (SYMBICORT)  160-4.5 MCG/ACT inhaler Inhale 2 puffs into the lungs 2 (two) times daily.   Yes [provider]  calcitRIOL (ROCALTROL) 0.25 MCG capsule Take 0.25 mcg by mouth every other day.   Yes [provider]  cetirizine (ZYRTEC ALLERGY) 10 MG tablet Take 10 mg by mouth daily.   Yes [provider]  colchicine 0.6 MG tablet Take 0.6 mg by mouth daily. 01/11/18  Yes [provider]  doxycycline (DORYX) 100 MG EC tablet Take 100 mg by mouth 2 (two) times daily. X 7 days  01/11/18 01/29/2018 Yes [provider]  fluconazole (DIFLUCAN) 100 MG tablet Take 100 mg by mouth daily.   Yes [provider]  furosemide (LASIX) 20 MG tablet Take 20 mg by mouth. take 3 tabs (60 mg) po daily for CHF   Yes [provider]  GLUCERNA (GLUCERNA) LIQD Take 237 mLs by mouth 3 (three) times daily between meals. GIVE 1 CAN (237 Ml) WITH EACH MEAL AS SUPPLEMENT    Yes [provider]  hydrOXYzine (ATARAX/VISTARIL) 25 MG tablet Take 25 mg by mouth every 6 (six) hours. Itching   Yes [provider]  insulin aspart (NOVOLOG) 100 UNIT/ML injection Inject 2-15 Units into the skin 3 (three) times daily before meals. SQ: 121-150= 2 units, 151-200= 3 units, 201-250= 5 units, 251-300= 8 units, 301-350= 11 units, 351-400=15 units, Greater than 400=15 units and Call MD   Yes [provider]  ipratropium-albuterol (DUONEB) 0.5-2.5 (3) MG/3ML SOLN Take 3 mLs by nebulization 2 (two) times daily.    Yes [provider]  isosorbide mononitrate (IMDUR) 30 MG 24 hr tablet Take 30 mg by mouth daily.   Yes [provider]  LORazepam (ATIVAN) 0.5 MG tablet Take 0.5 mg by mouth 2 (two) times daily.   Yes [provider]  Multiple Vitamins-Minerals (MULTIVITAMIN WITH MINERALS) tablet Take 1 tablet by mouth daily. Give 1 tablet by mouth qd to promote wound healing   Yes [provider]  omeprazole (PRILOSEC) 20 MG capsule Take 20 mg by mouth daily.   Yes [provider]  ranitidine (ZANTAC) 75 MG tablet Take 75 mg by mouth 2 (two) times daily. Take 2 tablets (300 mg) by mouth daily for itching   Yes [provider]  senna (SENOKOT) 8.6 MG TABS tablet Take 2 tablets by mouth every morning.   Yes [provider]  tiotropium (SPIRIVA) 18 MCG inhalation capsule Place 18 mcg into inhaler and inhale daily.   Yes [provider]  triamcinolone cream (KENALOG) 0.1 % Apply 1 application topically. apply  to affected areas two times daily. Apply to affected areas of trunk and extremities   Yes [provider]    Physical Exam: Vitals:   02/12/2018 1420 02/10/2018 1430 01/21/2018 1530 01/25/2018 1600  BP: (!) 152/80 (!) 117/92 138/70 (!) 137/54  Pulse: (!) 108 (!) 107 (!) 107 (!) 109  Resp: (!) 24 (!) 23 (!) 23 (!) 30  Temp:      TempSrc:      SpO2: 100% 100% 100% 100%    Constitutional: appears uncomfortable Vitals:   02/13/2018 1420 01/22/2018 1430 02/12/2018 1530 01/19/2018 1600  BP: (!) 152/80 (!) 117/92 138/70 (!) 137/54  Pulse: (!) 108 (!) 107 (!) 107 (!) 109  Resp: (!) 24 (!) 23 (!) 23 (!) 30  Temp:      TempSrc:      SpO2: 100% 100% 100% 100%   Eyes: PERRL, lids and conjunctivae normal  ENMT: Mucous membranes are dry. Posterior pharynx clear of any exudate or lesions.Normal dentition.  Neck: normal, supple, no masses, no thyromegaly Respiratory: coarse breath sounds bilaterally, tachypneic  Cardiovascular: tachycardic rate and rhythm, no murmurs / rubs / gallops. No extremity edema.  Abdomen: TTP in lower quadrants, distended, but not tense, soft.   Musculoskeletal: no clubbing / cyanosis. No joint deformity upper and lower extremities. Good ROM, no contractures. Normal muscle tone.  Skin: diffuse blanching erythematous macular rash with overlying flaking scale.  Bilateral pressure ulcers to heels bilaterally.  Palpable DP pulse (faint). Neurologic: CN 2-12 grossly intact. Moves all extremities.  Attends to voice.  Withdraws from pain. Psychiatric: Unable to assess, disoriented, attends to voice  Labs on Admission: I have personally reviewed following labs and imaging studies  CBC: Recent Labs  Lab 02/04/2018 1217  WBC 14.9*  NEUTROABS 12.9*  HGB 12.8*  HCT 41.2  MCV 83.1  PLT 387   Basic Metabolic Panel: Recent Labs  Lab 01/14/18 02/05/2018 1217  NA 150* 160*  K 3.9 6.0*  CL  --  114*  CO2  --  23  GLUCOSE  --  231*  BUN 50* 82*  CREATININE 3.9* 6.02*  CALCIUM   --  9.5   GFR: Estimated Creatinine Clearance: 8.2 mL/min (Delma Villalva) (by C-G formula based on SCr of 6.02 mg/dL (H)). Liver Function Tests: Recent Labs  Lab 01/30/2018 1217  AST 80*  ALT 59*  ALKPHOS 259*  BILITOT 0.7  PROT 8.0  ALBUMIN 2.5*   Recent Labs  Lab 01/18/2018 1217  LIPASE 21   No results for input(s): AMMONIA in the last 168 hours. Coagulation Profile: No results for input(s): INR, PROTIME in the last 168 hours. Cardiac Enzymes: No results for input(s): CKTOTAL, CKMB, CKMBINDEX, TROPONINI in the last 168 hours. BNP (last 3 results) No results for input(s): PROBNP in the last 8760 hours. HbA1C: No results for input(s): HGBA1C in the last 72 hours. CBG: No results for input(s): GLUCAP in the last 168 hours. Lipid Profile: No results for input(s): CHOL, HDL, LDLCALC, TRIG, CHOLHDL, LDLDIRECT in the last 72 hours. Thyroid Function Tests: No results for input(s): TSH, T4TOTAL, FREET4, T3FREE, THYROIDAB in the last 72 hours. Anemia Panel: No results for input(s): VITAMINB12, FOLATE, FERRITIN, TIBC, IRON, RETICCTPCT in the last 72 hours. Urine analysis:    Component Value Date/Time   COLORURINE AMBER (Avaiyah Strubel) 01/31/2018 1507   APPEARANCEUR CLEAR 01/18/2018 1507   LABSPEC 1.014 02/15/2018 1507   PHURINE 5.0 01/26/2018 1507   GLUCOSEU NEGATIVE 02/12/2018 1507   HGBUR SMALL (Dariyon Urquilla) 02/13/2018 1507   BILIRUBINUR NEGATIVE 02/11/2018 1507   KETONESUR NEGATIVE 02/13/2018 1507   PROTEINUR NEGATIVE 02/07/2018 1507   NITRITE NEGATIVE 01/20/2018 1507   LEUKOCYTESUR NEGATIVE 02/05/2018 1507    Radiological Exams on Admission: Ct Abdomen Pelvis Wo Contrast  Result Date: 02/01/2018 CLINICAL DATA:  Abdominal pain. Clinical concern for gastroenteritis or colitis. EXAM: CT ABDOMEN AND PELVIS WITHOUT CONTRAST TECHNIQUE: Multidetector CT imaging of the abdomen and pelvis was performed following the standard protocol without IV contrast. COMPARISON:  Abdomen and pelvis radiographs dated  11/17/2017 and abdomen and pelvis CT dated 02/07/2016. Abdomen ultrasound dated 08/19/2017. FINDINGS: Lower chest: Interval dependent atelectasis in the right lower lobe. Hepatobiliary: Mild sludge in the gallbladder. Normal appearing liver. Pancreas: Diffuse atrophy with partial fatty replacement. Spleen: Normal in size without focal abnormality. Adrenals/Urinary Tract: Adrenal glands are unremarkable. Kidneys are normal, without renal calculi, focal lesion, or hydronephrosis. Bladder is unremarkable. Stomach/Bowel:  Multiple right colon diverticula without evidence of diverticulitis. Interval mild diffuse rectal wall thickening. Normal appearing appendix, stomach and small bowel. Vascular/Lymphatic: Atheromatous arterial calcifications. And wrist small dilatation of the proximal infrarenal abdominal aorta with Joyell Emami more prominent focal out pouching posterolaterally on the left. The maximum total diameter, including the focal outpouching, is 4.2 cm, previously 3.6 cm. The focal outpouching is rounded and measures 1.5 cm in maximum diameter on coronal image number 57 and 1.6 cm in maximum diameter on axial image number 29 series 2. Also demonstrated is mild aneurysmal dilatation of the left common iliac artery with Alexes Menchaca maximum AP diameter 1.8 cm on image number 52 series 2. No enlarged lymph nodes. Reproductive: Mildly and enlarged prostate gland containing coarse calcifications. Other: Small bilateral inguinal hernias containing fat. Musculoskeletal: Bilateral hip degenerative changes. Lumbar and lower thoracic spine degenerative changes. IMPRESSION: 1. Progressive aneurysmal dilatation of the proximal infrarenal abdominal aorta due to Lavergne Hiltunen more prominent focal rounded outpouching on the left. The maximum combined diameter is 4.2 cm, increased from 3.6 cm. Recommend vascular surgery consultation due to the increased size of the more focal rounded outpouching, currently measuring 1.6 cm. 2. 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 3. Interval mild diffuse rectal wall thickening, suggesting mild proctitis. Right colon diverticulosis without evidence of diverticulitis. 4. Mild sludge in the gallbladder. 5. Diffuse pancreatic atrophy. 6. Right lower lobe dependent atelectasis. Electronically Signed   By: Beckie Salts M.D.   On: 02/16/2018 13:45   Dg Chest 2 View  Result Date: 02/07/2018 CLINICAL DATA:  Mental status change EXAM: CHEST - 2 VIEW COMPARISON:  11/17/2017 FINDINGS: The heart size and mediastinal contours are within normal limits. Both lungs are clear. The visualized skeletal structures are unremarkable. IMPRESSION: No active cardiopulmonary disease. Electronically Signed   By: Marlan Palau M.D.   On: 02/11/2018 12:59    EKG: Independently reviewed. Poor quality as pt tremulous.  Basline poor.  Tachycardic.  Sinus. Appears similar to priors.  Old q's in III and aVF.  Possible new q in lead II.     Assessment/Plan Active Problems:   Sepsis (HCC)  Sepsis  Proctitis  Abdominal Pain: AMS, elevated WBC, tachycardia with evidence of proctitis on imaging.  CXR without active dz.  UA bland.  Significantly elevated lactate on presentation.  Received 2 L at facility and received additional 2 L here. Continue IVF Flagyl/cefepime for proctitis Follow blood and urine cx Trend lactate Fentanyl prn pain zofran prn NPO   Dehydration  Hypernatremia: Lasix has been held over past few days due to this.  On discussion with staff from facility, sounds like he's had decreased PO intake as well (sounds like this is worse recently, but chronic). 5.4 L free water deficit Repeat BMP now S/p 4 L in boluses at facility and in ED Start 1/2 NS at 200 cc/hr and follow AM sodium  Acute Kidney Injury on CKD  Hyperkalemia: baseline creatinine appears to be around 2, but worsening over past month.  6.02 on presentation.  Likely due to dehydration.  No evidence of hydro on  imaging.  Urine bland. Hyperkalemia in setting of acute kidney injury.  Follow repeat BMP now.  No EKG changes appreciated. Follow with IVF Strict I/O Hold lasix Renally dose meds Place foley for accurate I/O's  Elevated Liver Enzymes:  Suspect 2/2 dehydration as well.  Follow with IVF.  Follow hepatitis panel.  Acute Encephalopathy: Likely 2/2 above.  At baseline disoriented, but alert and able to converse.  Now attends to voice, and groans with painful stimuli (examination of abdomen).  Follow with treatment of above.  Appears to be protecting airway at present time, GCS 9.  No chart history of dementia, but suspect this is the case since he has guardian and it sounds like he's disoriented at baseline.  Follow head CT Speech evaluation for swallow  Rash: diffuse erythematous macular rash with peeling/scaling.  On discussion with family, staff, and review of chart, this seems chronic.  Present since April, waxing, waning.  Saw dermatology recently who trialed triamcinolone, but on discussion with staff, got worse with this.  Sounds like plan was to return for biopsy. Follow  Pressure Ulcers: to bilateral heels.  Will check arterial studies.  Wound consult.   COPD: dulera, spiriva. Prn albuterol.  Gout: hold allopurinol, would decrease colchicine when pt able to take PO given renal function  Hypertension: holding amlodipine, imdur due to sepsis above.  Holding lasix.  T2DM: SSI  HFpEF: grade 1 dd on echo from 10/2017.  Follow with IVF.  Appears dry.   Abdominal Aortic Aneurysm: will need f/u with vascular surgery as outpatient (discussed findings with vascular)  Of note, pt has midline on L arm.  Not sure when this was placed or what the indication was for this.  Would contact [facility] about this tomorrow if possible.  Will use other access for now.  DVT prophylaxis: heparin Code Status: full code, confirmed with his sister who is his guardian  Family Communication: sister,  [name]  Disposition Plan: pending improvement  Consults called: none Admission status: stepdown    50 minutes of critical care time.  Pt critically ill with AKI, hyperkalemia, hypernatremia, AMS.    Lacretia Nicks MD [facility] Pager [phone]  If 7PM-7AM, please contact night-coverage [email or web address] Password [facility]  02/05/2018, 5:21 PM

## 2018-01-18 ENCOUNTER — Inpatient Hospital Stay (HOSPITAL_COMMUNITY): Payer: Medicare Other

## 2018-01-18 DIAGNOSIS — Z888 Allergy status to other drugs, medicaments and biological substances status: Secondary | ICD-10-CM

## 2018-01-18 DIAGNOSIS — I5030 Unspecified diastolic (congestive) heart failure: Secondary | ICD-10-CM

## 2018-01-18 DIAGNOSIS — I10 Essential (primary) hypertension: Secondary | ICD-10-CM

## 2018-01-18 DIAGNOSIS — Z515 Encounter for palliative care: Secondary | ICD-10-CM

## 2018-01-18 DIAGNOSIS — K6289 Other specified diseases of anus and rectum: Secondary | ICD-10-CM

## 2018-01-18 DIAGNOSIS — E870 Hyperosmolality and hypernatremia: Secondary | ICD-10-CM

## 2018-01-18 DIAGNOSIS — R234 Changes in skin texture: Secondary | ICD-10-CM

## 2018-01-18 DIAGNOSIS — Z794 Long term (current) use of insulin: Secondary | ICD-10-CM

## 2018-01-18 DIAGNOSIS — R109 Unspecified abdominal pain: Secondary | ICD-10-CM

## 2018-01-18 DIAGNOSIS — R4182 Altered mental status, unspecified: Secondary | ICD-10-CM

## 2018-01-18 DIAGNOSIS — K5790 Diverticulosis of intestine, part unspecified, without perforation or abscess without bleeding: Secondary | ICD-10-CM

## 2018-01-18 DIAGNOSIS — K839 Disease of biliary tract, unspecified: Secondary | ICD-10-CM

## 2018-01-18 DIAGNOSIS — E1169 Type 2 diabetes mellitus with other specified complication: Secondary | ICD-10-CM

## 2018-01-18 DIAGNOSIS — Z7189 Other specified counseling: Secondary | ICD-10-CM

## 2018-01-18 DIAGNOSIS — N400 Enlarged prostate without lower urinary tract symptoms: Secondary | ICD-10-CM

## 2018-01-18 DIAGNOSIS — Z880 Allergy status to penicillin: Secondary | ICD-10-CM

## 2018-01-18 DIAGNOSIS — G92 Toxic encephalopathy: Secondary | ICD-10-CM

## 2018-01-18 DIAGNOSIS — I5042 Chronic combined systolic (congestive) and diastolic (congestive) heart failure: Secondary | ICD-10-CM

## 2018-01-18 DIAGNOSIS — A419 Sepsis, unspecified organism: Secondary | ICD-10-CM

## 2018-01-18 DIAGNOSIS — R7401 Elevation of levels of liver transaminase levels: Secondary | ICD-10-CM

## 2018-01-18 DIAGNOSIS — N183 Chronic kidney disease, stage 3 (moderate): Secondary | ICD-10-CM

## 2018-01-18 DIAGNOSIS — R740 Nonspecific elevation of levels of transaminase and lactic acid dehydrogenase [LDH]: Secondary | ICD-10-CM

## 2018-01-18 DIAGNOSIS — Z91048 Other nonmedicinal substance allergy status: Secondary | ICD-10-CM

## 2018-01-18 DIAGNOSIS — R21 Rash and other nonspecific skin eruption: Secondary | ICD-10-CM

## 2018-01-18 DIAGNOSIS — N189 Chronic kidney disease, unspecified: Secondary | ICD-10-CM

## 2018-01-18 DIAGNOSIS — G9341 Metabolic encephalopathy: Secondary | ICD-10-CM

## 2018-01-18 DIAGNOSIS — M109 Gout, unspecified: Secondary | ICD-10-CM

## 2018-01-18 DIAGNOSIS — A4102 Sepsis due to Methicillin resistant Staphylococcus aureus: Principal | ICD-10-CM

## 2018-01-18 DIAGNOSIS — N179 Acute kidney failure, unspecified: Secondary | ICD-10-CM

## 2018-01-18 DIAGNOSIS — J449 Chronic obstructive pulmonary disease, unspecified: Secondary | ICD-10-CM

## 2018-01-18 DIAGNOSIS — I739 Peripheral vascular disease, unspecified: Secondary | ICD-10-CM

## 2018-01-18 DIAGNOSIS — E875 Hyperkalemia: Secondary | ICD-10-CM

## 2018-01-18 DIAGNOSIS — R7881 Bacteremia: Secondary | ICD-10-CM

## 2018-01-18 DIAGNOSIS — I130 Hypertensive heart and chronic kidney disease with heart failure and stage 1 through stage 4 chronic kidney disease, or unspecified chronic kidney disease: Secondary | ICD-10-CM

## 2018-01-18 DIAGNOSIS — B9562 Methicillin resistant Staphylococcus aureus infection as the cause of diseases classified elsewhere: Secondary | ICD-10-CM

## 2018-01-18 DIAGNOSIS — G929 Unspecified toxic encephalopathy: Secondary | ICD-10-CM

## 2018-01-18 DIAGNOSIS — Z87891 Personal history of nicotine dependence: Secondary | ICD-10-CM

## 2018-01-18 LAB — GLUCOSE, CAPILLARY
GLUCOSE-CAPILLARY: 112 mg/dL — AB (ref 70–99)
GLUCOSE-CAPILLARY: 78 mg/dL (ref 70–99)
Glucose-Capillary: 120 mg/dL — ABNORMAL HIGH (ref 70–99)
Glucose-Capillary: 147 mg/dL — ABNORMAL HIGH (ref 70–99)

## 2018-01-18 LAB — COMPREHENSIVE METABOLIC PANEL
ALBUMIN: 2.1 g/dL — AB (ref 3.5–5.0)
ALK PHOS: 184 U/L — AB (ref 38–126)
ALT: 47 U/L — ABNORMAL HIGH (ref 0–44)
ANION GAP: 17 — AB (ref 5–15)
AST: 74 U/L — ABNORMAL HIGH (ref 15–41)
BILIRUBIN TOTAL: 0.4 mg/dL (ref 0.3–1.2)
BUN: 86 mg/dL — ABNORMAL HIGH (ref 8–23)
CO2: 20 mmol/L — ABNORMAL LOW (ref 22–32)
Calcium: 8.3 mg/dL — ABNORMAL LOW (ref 8.9–10.3)
Chloride: 121 mmol/L — ABNORMAL HIGH (ref 98–111)
Creatinine, Ser: 5.55 mg/dL — ABNORMAL HIGH (ref 0.61–1.24)
GFR calc non Af Amer: 8 mL/min — ABNORMAL LOW (ref >60)
GFR, EST AFRICAN AMERICAN: 9 mL/min — AB (ref >60)
GLUCOSE: 128 mg/dL — AB (ref 70–99)
Potassium: 5 mmol/L (ref 3.5–5.1)
Sodium: 158 mmol/L — ABNORMAL HIGH (ref 135–145)
TOTAL PROTEIN: 6.5 g/dL (ref 6.5–8.1)

## 2018-01-18 LAB — URINE CULTURE

## 2018-01-18 LAB — CBC
HCT: 33.1 % — ABNORMAL LOW (ref 39.0–52.0)
HEMOGLOBIN: 10.4 g/dL — AB (ref 13.0–17.0)
MCH: 25.4 pg — ABNORMAL LOW (ref 26.0–34.0)
MCHC: 31.4 g/dL (ref 30.0–36.0)
MCV: 80.9 fL (ref 78.0–100.0)
Platelets: 288 10*3/uL (ref 150–400)
RBC: 4.09 MIL/uL — ABNORMAL LOW (ref 4.22–5.81)
RDW: 19 % — ABNORMAL HIGH (ref 11.5–15.5)
WBC: 9.5 10*3/uL (ref 4.0–10.5)

## 2018-01-18 LAB — BLOOD CULTURE ID PANEL (REFLEXED)
Acinetobacter baumannii: NOT DETECTED
CANDIDA ALBICANS: NOT DETECTED
CANDIDA TROPICALIS: NOT DETECTED
Candida glabrata: NOT DETECTED
Candida krusei: NOT DETECTED
Candida parapsilosis: NOT DETECTED
ENTEROBACTER CLOACAE COMPLEX: NOT DETECTED
ENTEROBACTERIACEAE SPECIES: NOT DETECTED
Enterococcus species: NOT DETECTED
Escherichia coli: NOT DETECTED
Haemophilus influenzae: NOT DETECTED
KLEBSIELLA PNEUMONIAE: NOT DETECTED
Klebsiella oxytoca: NOT DETECTED
Listeria monocytogenes: NOT DETECTED
Methicillin resistance: DETECTED — AB
NEISSERIA MENINGITIDIS: NOT DETECTED
PROTEUS SPECIES: NOT DETECTED
Pseudomonas aeruginosa: NOT DETECTED
STAPHYLOCOCCUS SPECIES: DETECTED — AB
Serratia marcescens: NOT DETECTED
Staphylococcus aureus (BCID): DETECTED — AB
Streptococcus agalactiae: NOT DETECTED
Streptococcus pneumoniae: NOT DETECTED
Streptococcus pyogenes: NOT DETECTED
Streptococcus species: NOT DETECTED

## 2018-01-18 LAB — ECHOCARDIOGRAM COMPLETE
Height: 68 in
WEIGHTICAEL: 2448 [oz_av]

## 2018-01-18 LAB — MAGNESIUM: Magnesium: 2.4 mg/dL (ref 1.7–2.4)

## 2018-01-18 MED ORDER — LABETALOL HCL 5 MG/ML IV SOLN
10.0000 mg | INTRAVENOUS | Status: DC | PRN
  Filled 2018-01-18: qty 4

## 2018-01-18 MED ORDER — VANCOMYCIN HCL 500 MG IV SOLR
500.0000 mg | Freq: Once | INTRAVENOUS | Status: AC
  Administered 2018-01-18: 500 mg via INTRAVENOUS
  Filled 2018-01-18: qty 500

## 2018-01-18 MED ORDER — DEXTROSE-NACL 5-0.45 % IV SOLN
INTRAVENOUS | Status: DC
  Administered 2018-01-18 – 2018-01-19 (×3): via INTRAVENOUS

## 2018-01-18 MED ORDER — ORAL CARE MOUTH RINSE
15.0000 mL | Freq: Two times a day (BID) | OROMUCOSAL | Status: DC
  Administered 2018-01-18 – 2018-01-22 (×8): 15 mL via OROMUCOSAL

## 2018-01-18 MED ORDER — CHLORHEXIDINE GLUCONATE CLOTH 2 % EX PADS
6.0000 | MEDICATED_PAD | Freq: Every day | CUTANEOUS | Status: AC
  Administered 2018-01-19 – 2018-01-21 (×3): 6 via TOPICAL

## 2018-01-18 MED ORDER — FENTANYL CITRATE (PF) 100 MCG/2ML IJ SOLN
12.5000 ug | INTRAMUSCULAR | Status: DC | PRN
  Administered 2018-01-18 – 2018-01-19 (×7): 25 ug via INTRAVENOUS
  Filled 2018-01-18 (×7): qty 2

## 2018-01-18 MED ORDER — MUPIROCIN 2 % EX OINT
1.0000 "application " | TOPICAL_OINTMENT | Freq: Two times a day (BID) | CUTANEOUS | Status: AC
  Administered 2018-01-18 – 2018-01-22 (×10): 1 via NASAL
  Filled 2018-01-18 (×2): qty 22

## 2018-01-18 MED ORDER — CHLORHEXIDINE GLUCONATE 0.12 % MT SOLN
15.0000 mL | Freq: Two times a day (BID) | OROMUCOSAL | Status: DC
  Administered 2018-01-18 – 2018-01-22 (×9): 15 mL via OROMUCOSAL
  Filled 2018-01-18 (×7): qty 15

## 2018-01-18 NOTE — Progress Notes (Signed)
SLP Cancellation Note  Patient Details Name: Greg RobertsJonathan Kindel MRN: [id number] DOB: 06/02/1928   Cancelled treatment:       Reason Eval/Treat Not Completed: Fatigue/lethargy limiting ability to participate;Medical issues which prohibited therapy;Other (comment)(Nursing deferred; decreased LOA)   Tressie StalkerPat [facility], M.S., CCC-SLP 01/18/2018, 2:31 PM

## 2018-01-18 NOTE — Consult Note (Addendum)
[facility]  Total days of antibiotics 2        Day 2 cefepime/metro        Day 1 vanco        Reason for Consult: MRSA bacteremia   Referring Physician: feliz-ortiz  Active Problems:   Essential hypertension   Chronic combined systolic and diastolic CHF (congestive heart failure) (HCC)   CKD (chronic kidney disease), stage IV (HCC)   Stage I pressure ulcer   Type 2 diabetes mellitus with other specified complication (HCC)   Sepsis (HCC)   Proctitis   Hypernatremia   Acute kidney injury superimposed on CKD (HCC)   Hyperkalemia   Transaminitis   Toxic encephalopathy   Gout    HPI: Greg Jones is a 84 y.o. male with severe COPD, hx of CKD 3, CHF, HTN, long-term care resident admitted on 8/1 for abdominal pain but found to have lactic acidosis, signs of dehydration with serum sodium of 160, andAcute on Chronic kidney injury with Cr 6, leukocytosis of 14.9 with mild left shift. Complained of abdominal pain, and was started on empiric course of cefepime and metronidazole. His abdominal CT showed gallbladder sludge, enlarged prostate. His infectious work up revealed 3 of 4 bottles + MRSA. Interestingly, he was seen on 7/30, 2 days prior to admit by healthcare provider at SNF, without any concerning symptoms.  The patient has altered mental status and is not able to provide much description to his HPI.   He had COPD exacerbation in May and June requiring hospital admission -Given a 7 day course of doxycycline at each hospitalization. Rash was present at those times described as maculopapular- rash noted since February  Past Medical History:  Diagnosis Date  . CHF (congestive heart failure) ([place])   . Chronic kidney disease   . COPD (chronic obstructive pulmonary disease) ([place])   . GERD (gastroesophageal reflux disease)   . Hypertension   . Respiratory insufficiency     Allergies:  Allergies  Allergen Reactions  . Other Other (See Comments)   Oxygen Sensitive  . Penicillins Rash and Other (See Comments)    Any -illins as well as Mycins Has patient had a PCN reaction causing immediate rash, facial/tongue/throat swelling, SOB or lightheadedness with hypotension: Yes Has patient had a PCN reaction causing severe rash involving mucus membranes or skin necrosis: Yes Has patient had a PCN reaction that required hospitalization No Has patient had a PCN reaction occurring within the last 10 years: No If all of the above answers are "NO", then may proceed with Cephalosporin use.   . Vitamin B12 Itching, Rash and Other (See Comments)    injections     MEDICATIONS: . albuterol  2.5 mg Nebulization Q6H  . ceFEPime (MAXIPIME) IV  500 mg Intravenous Q24H  . chlorhexidine  15 mL Mouth Rinse BID  . Chlorhexidine Gluconate Cloth  6 each Topical Q0600  . heparin  5,000 Units Subcutaneous Q8H  . insulin aspart  0-5 Units Subcutaneous QHS  . insulin aspart  0-9 Units Subcutaneous TID WC  . mouth rinse  15 mL Mouth Rinse q12n4p  . mometasone-formoterol  2 puff Inhalation BID  . mupirocin ointment  1 application Nasal BID  . tiotropium  18 mcg Inhalation Q24H    Social History   Tobacco Use  . Smoking status: Former Smoker    Packs/day: 0.50    Years: 70.00    Pack years: 35.00    Types: Cigarettes  Last attempt to quit: 04/19/2013    Years since quitting: 4.7  . Smokeless tobacco: Never Used  Substance Use Topics  . Alcohol use: No    Alcohol/week: 0.0 oz  . Drug use: No    Family History  Problem Relation Age of Onset  . Tuberculosis Mother   . Diabetes Father     Review of Systems -  Unable to obtain due to altered mental status  OBJECTIVE: Temp:  [97.3 F (36.3 C)-98.6 F (37 C)] 98.1 F (36.7 C) (08/02 1200) Pulse Rate:  [75-110] 86 (08/02 1000) Resp:  [18-32] 21 (08/02 1019) BP: (116-166)/(54-121) 165/121 (08/01 2100) SpO2:  [100 %] 100 % (08/02 1200) Arterial Line BP: (104-135)/(34-48) 110/35 (08/02  1200) Weight:  [153 lb (69.4 kg)] 153 lb (69.4 kg) (08/02 1019)  Constitutional: He attempts to open eyes to verbal stimuli. only. He appears chronically ill and well-nourished. In mild distress-groaning occasionally. Exfoliating rash from head to toe HENT:  Mouth/Throat: Oropharynx is clear and moist. No oropharyngeal exudate.  Cardiovascular: tachy, regular rhythm and normal heart sounds. Exam reveals no gallop and no friction rub.  No murmur heard.  Pulmonary/Chest: Effort normal and breath sounds normal. No respiratory distress. He has no wheezes.  Abdominal: Soft. Bowel sounds are soft, He exhibits  distension. There is mild tenderness.  Skin: Skin is warm but has signs of exfoliation and erythroderma from head to toe. Bilateral heels eschar  LABS: Results for orders placed or performed during the hospital encounter of 02/03/2018 (from the past 48 hour(s))  Culture, blood (routine x 2)     Status: None (Preliminary result)   Collection Time: 02/04/2018 11:40 AM  Result Value Ref Range   Specimen Description      BLOOD RIGHT ANTECUBITAL Performed at [facility] [address] [place], [place] [address]    Special Requests      BOTTLES DRAWN AEROBIC AND ANAEROBIC Blood Culture adequate volume Performed at [facility] [address], [place], [place] [address]    Culture  Setup Time      IN BOTH AEROBIC AND ANAEROBIC BOTTLES GRAM POSITIVE COCCI CRITICAL RESULT CALLED TO, READ BACK BY AND VERIFIED WITH: PHARMD J GADHIA 01/18/18 AT 823 AM BY CM Performed at [facility], [place] [address], [place], [place] [address]    Culture GRAM POSITIVE COCCI    Report Status PENDING   Culture, blood (routine x 2)     Status: None (Preliminary result)   Collection Time: 01/27/2018 11:40 AM  Result Value Ref Range   Specimen Description      BLOOD BLOOD RIGHT FOREARM Performed at [facility] [address], [place], [place]  [address]    Special Requests      BOTTLES DRAWN AEROBIC ONLY Blood Culture adequate volume Performed at [facility] [address], [place], [place] [address]    Culture  Setup Time      GRAM POSITIVE COCCI AEROBIC BOTTLE ONLY CRITICAL VALUE NOTED.  VALUE IS CONSISTENT WITH PREVIOUSLY REPORTED AND CALLED VALUE. Performed at [facility], [place] [address] [place], [place] [address]    Culture GRAM POSITIVE COCCI    Report Status PENDING   Blood Culture ID Panel (Reflexed)     Status: Abnormal   Collection Time: 02/06/2018 11:40 AM  Result Value Ref Range   Enterococcus species NOT DETECTED NOT DETECTED   Listeria monocytogenes NOT DETECTED NOT DETECTED   Staphylococcus  species DETECTED (A) NOT DETECTED    Comment: CRITICAL RESULT CALLED TO, READ BACK BY AND VERIFIED WITH: PHARMD J GADHIA 01/18/18 AT 825 AM BYCM    Staphylococcus aureus DETECTED (A) NOT DETECTED    Comment: Methicillin (oxacillin)-resistant Staphylococcus aureus (MRSA). MRSA is predictably resistant to beta-lactam antibiotics (except ceftaroline). Preferred therapy is vancomycin unless clinically contraindicated. Patient requires contact precautions if  hospitalized. CRITICAL RESULT CALLED TO, READ BACK BY AND VERIFIED WITH: PHARMD J GADHIA 01/18/18 AT 825 AM BY CM    Methicillin resistance DETECTED (A) NOT DETECTED    Comment: CRITICAL RESULT CALLED TO, READ BACK BY AND VERIFIED WITH: PHARMD J GADHIA 01/18/18 AT 825 BY CM    Streptococcus species NOT DETECTED NOT DETECTED   Streptococcus agalactiae NOT DETECTED NOT DETECTED   Streptococcus pneumoniae NOT DETECTED NOT DETECTED   Streptococcus pyogenes NOT DETECTED NOT DETECTED   Acinetobacter baumannii NOT DETECTED NOT DETECTED   Enterobacteriaceae species NOT DETECTED NOT DETECTED   Enterobacter cloacae complex NOT DETECTED NOT DETECTED   Escherichia coli NOT DETECTED NOT DETECTED   Klebsiella oxytoca NOT DETECTED NOT DETECTED   Klebsiella  pneumoniae NOT DETECTED NOT DETECTED   Proteus species NOT DETECTED NOT DETECTED   Serratia marcescens NOT DETECTED NOT DETECTED   Haemophilus influenzae NOT DETECTED NOT DETECTED   Neisseria meningitidis NOT DETECTED NOT DETECTED   Pseudomonas aeruginosa NOT DETECTED NOT DETECTED   Candida albicans NOT DETECTED NOT DETECTED   Candida glabrata NOT DETECTED NOT DETECTED   Candida krusei NOT DETECTED NOT DETECTED   Candida parapsilosis NOT DETECTED NOT DETECTED   Candida tropicalis NOT DETECTED NOT DETECTED    Comment: Performed at [facility] [address], [place], [place] [address]  CBC with Differential     Status: Abnormal   Collection Time: 01/31/2018 12:17 PM  Result Value Ref Range   WBC 14.9 (H) 4.0 - 10.5 K/uL   RBC 4.96 4.22 - 5.81 MIL/uL   Hemoglobin 12.8 (L) 13.0 - 17.0 g/dL   HCT 41.2 39.0 - 52.0 %   MCV 83.1 78.0 - 100.0 fL   MCH 25.8 (L) 26.0 - 34.0 pg   MCHC 31.1 30.0 - 36.0 g/dL   RDW 19.0 (H) 11.5 - 15.5 %   Platelets 387 150 - 400 K/uL   Neutrophils Relative % 86 %   Lymphocytes Relative 9 %   Monocytes Relative 4 %   Eosinophils Relative 0 %   Basophils Relative 1 %   Neutro Abs 12.9 (H) 1.7 - 7.7 K/uL   Lymphs Abs 1.3 0.7 - 4.0 K/uL   Monocytes Absolute 0.6 0.1 - 1.0 K/uL   Eosinophils Absolute 0.0 0.0 - 0.7 K/uL   Basophils Absolute 0.1 0.0 - 0.1 K/uL   WBC Morphology MILD LEFT SHIFT (1-5% METAS, OCC MYELO, OCC BANDS)     Comment: Performed at [facility], [place] [address], [place], [place] [address]  Comprehensive metabolic panel     Status: Abnormal   Collection Time: 02/04/2018 12:17 PM  Result Value Ref Range   Sodium 160 (H) 135 - 145 mmol/L    Comment: REPEATED TO VERIFY   Potassium 6.0 (H) 3.5 - 5.1 mmol/L    Comment: NO VISIBLE HEMOLYSIS   Chloride 114 (H) 98 - 111 mmol/L    Comment: REPEATED TO VERIFY   CO2 23 22 - 32 mmol/L    Comment: REPEATED TO VERIFY   Glucose, Bld 231 (H) 70 - 99 mg/dL  BUN 82 (H) 8 - 23 mg/dL    Creatinine, Ser 6.02 (H) 0.61 - 1.24 mg/dL   Calcium 9.5 8.9 - 10.3 mg/dL   Total Protein 8.0 6.5 - 8.1 g/dL   Albumin 2.5 (L) 3.5 - 5.0 g/dL   AST 80 (H) 15 - 41 U/L   ALT 59 (H) 0 - 44 U/L   Alkaline Phosphatase 259 (H) 38 - 126 U/L   Total Bilirubin 0.7 0.3 - 1.2 mg/dL   GFR calc non Af Amer 7 (L) >60 mL/min   GFR calc Af Amer 9 (L) >60 mL/min    Comment: (NOTE) The eGFR has been calculated using the CKD EPI equation. This calculation has not been validated in all clinical situations. eGFR's persistently <60 mL/min signify possible Chronic Kidney Disease.    Anion gap 23 (H) 5 - 15    Comment: REPEATED TO VERIFY Performed at [facility], [place] [address] [place], [place] [address]   Lipase, blood     Status: None   Collection Time: 02/10/2018 12:17 PM  Result Value Ref Range   Lipase 21 11 - 51 U/L    Comment: Performed at [facility], [place] [address], [place], [place] [address]  I-Stat CG4 Lactic Acid, ED     Status: Abnormal   Collection Time: 02/04/2018 12:20 PM  Result Value Ref Range   Lactic Acid, Venous 6.93 (HH) 0.5 - 1.9 mmol/L   Comment NOTIFIED PHYSICIAN   I-Stat CG4 Lactic Acid, ED     Status: Abnormal   Collection Time: 02/11/2018  2:13 PM  Result Value Ref Range   Lactic Acid, Venous 5.43 (HH) 0.5 - 1.9 mmol/L   Comment NOTIFIED PHYSICIAN   Urinalysis, Routine w reflex microscopic     Status: Abnormal   Collection Time: 02/04/2018  3:07 PM  Result Value Ref Range   Color, Urine AMBER (A) YELLOW    Comment: BIOCHEMICALS MAY BE AFFECTED BY COLOR   APPearance CLEAR CLEAR   Specific Gravity, Urine 1.014 1.005 - 1.030   pH 5.0 5.0 - 8.0   Glucose, UA NEGATIVE NEGATIVE mg/dL   Hgb urine dipstick SMALL (A) NEGATIVE   Bilirubin Urine NEGATIVE NEGATIVE   Ketones, ur NEGATIVE NEGATIVE mg/dL   Protein, ur NEGATIVE NEGATIVE mg/dL   Nitrite NEGATIVE NEGATIVE   Leukocytes, UA NEGATIVE NEGATIVE   RBC / HPF 0-5 0 - 5 RBC/hpf   WBC,  UA 0-5 0 - 5 WBC/hpf   Bacteria, UA FEW (A) NONE SEEN   Squamous Epithelial / LPF 0-5 0 - 5   Mucus PRESENT    Hyaline Casts, UA PRESENT     Comment: Performed at [facility], [place] [address], [place], [place] [address]  Urine culture     Status: Abnormal   Collection Time: 02/11/2018  3:07 PM  Result Value Ref Range   Specimen Description      URINE, RANDOM Performed at [facility] [address], [place], [place] [address]    Special Requests      NONE Performed at [facility], [place] [address], [place], [place] [address]    Culture MULTIPLE SPECIES PRESENT, SUGGEST RECOLLECTION (A)    Report Status 01/18/2018 FINAL   Basic metabolic panel     Status: Abnormal   Collection Time: 02/14/2018  5:34 PM  Result Value Ref Range   Sodium 158 (H) 135 - 145 mmol/L   Potassium 5.5 (H) 3.5 - 5.1  mmol/L   Chloride 120 (H) 98 - 111 mmol/L   CO2 22 22 - 32 mmol/L   Glucose, Bld 192 (H) 70 - 99 mg/dL   BUN 80 (H) 8 - 23 mg/dL   Creatinine, Ser 5.71 (H) 0.61 - 1.24 mg/dL   Calcium 8.4 (L) 8.9 - 10.3 mg/dL   GFR calc non Af Amer 8 (L) >60 mL/min   GFR calc Af Amer 9 (L) >60 mL/min    Comment: (NOTE) The eGFR has been calculated using the CKD EPI equation. This calculation has not been validated in all clinical situations. eGFR's persistently <60 mL/min signify possible Chronic Kidney Disease.    Anion gap 16 (H) 5 - 15    Comment: Performed at [facility], [place] [address] [place], [place] [address]  Lactic acid, plasma     Status: Abnormal   Collection Time: 02/13/2018  5:34 PM  Result Value Ref Range   Lactic Acid, Venous 3.3 (HH) 0.5 - 1.9 mmol/L    Comment: CRITICAL RESULT CALLED TO, READ BACK BY AND VERIFIED WITH: MATHAI,E AT 1818 ON 01/25/2018 BY MOSLEY,J Performed at [facility], [place] [address], [place], [place] [address]   MRSA PCR Screening     Status: Abnormal   Collection Time:  01/20/2018  6:45 PM  Result Value Ref Range   MRSA by PCR POSITIVE (A) NEGATIVE    Comment:        The GeneXpert MRSA Assay (FDA approved for NASAL specimens only), is one component of a comprehensive MRSA colonization surveillance program. It is not intended to diagnose MRSA infection nor to guide or monitor treatment for MRSA infections. RESULT CALLED TO, READ BACK BY AND VERIFIED WITH: MCMILLIAN,S AT 2005 ON 02/11/2018 BY MOSLEY,J Performed at [facility], [place] [address], [place], [place] [address]   TSH     Status: None   Collection Time: 02/09/2018  8:39 PM  Result Value Ref Range   TSH 2.720 0.350 - 4.500 uIU/mL    Comment: Performed by a 3rd Generation assay with a functional sensitivity of <=0.01 uIU/mL. Performed at [facility], [place] [address], [place], [place] [address]   Lactic acid, plasma     Status: Abnormal   Collection Time: 01/28/2018  8:39 PM  Result Value Ref Range   Lactic Acid, Venous 3.8 (HH) 0.5 - 1.9 mmol/L    Comment: CRITICAL RESULT CALLED TO, READ BACK BY AND VERIFIED WITH: MCMILLIAN,S AT 2120 ON 02/04/2018 BY MOSLEY,J Performed at [facility], [place] [address] [place], [place] [address]   Glucose, capillary     Status: Abnormal   Collection Time: 01/27/2018  9:34 PM  Result Value Ref Range   Glucose-Capillary 162 (H) 70 - 99 mg/dL   Comment 1 Notify RN   Comprehensive metabolic panel     Status: Abnormal   Collection Time: 01/18/18  2:53 AM  Result Value Ref Range   Sodium 158 (H) 135 - 145 mmol/L   Potassium 5.0 3.5 - 5.1 mmol/L   Chloride 121 (H) 98 - 111 mmol/L   CO2 20 (L) 22 - 32 mmol/L   Glucose, Bld 128 (H) 70 - 99 mg/dL   BUN 86 (H) 8 - 23 mg/dL   Creatinine, Ser 5.55 (H) 0.61 - 1.24 mg/dL   Calcium 8.3 (L) 8.9 - 10.3 mg/dL   Total Protein 6.5 6.5 - 8.1 g/dL   Albumin 2.1 (L) 3.5 - 5.0 g/dL  AST 74 (H) 15 - 41 U/L   ALT 47 (H) 0 - 44 U/L   Alkaline Phosphatase 184 (H) 38 - 126 U/L    Total Bilirubin 0.4 0.3 - 1.2 mg/dL   GFR calc non Af Amer 8 (L) >60 mL/min   GFR calc Af Amer 9 (L) >60 mL/min    Comment: (NOTE) The eGFR has been calculated using the CKD EPI equation. This calculation has not been validated in all clinical situations. eGFR's persistently <60 mL/min signify possible Chronic Kidney Disease.    Anion gap 17 (H) 5 - 15    Comment: Performed at [facility], [place] [address] [place], [place] [address]  CBC     Status: Abnormal   Collection Time: 01/18/18  2:53 AM  Result Value Ref Range   WBC 9.5 4.0 - 10.5 K/uL   RBC 4.09 (L) 4.22 - 5.81 MIL/uL   Hemoglobin 10.4 (L) 13.0 - 17.0 g/dL   HCT 33.1 (L) 39.0 - 52.0 %   MCV 80.9 78.0 - 100.0 fL   MCH 25.4 (L) 26.0 - 34.0 pg   MCHC 31.4 30.0 - 36.0 g/dL   RDW 19.0 (H) 11.5 - 15.5 %   Platelets 288 150 - 400 K/uL    Comment: Performed at [facility], [place] [address], [place], [place] [address]  Magnesium     Status: None   Collection Time: 01/18/18  2:53 AM  Result Value Ref Range   Magnesium 2.4 1.7 - 2.4 mg/dL    Comment: Performed at [facility], [place] [address] [place], [place] [address]  Glucose, capillary     Status: Abnormal   Collection Time: 01/18/18  8:31 AM  Result Value Ref Range   Glucose-Capillary 112 (H) 70 - 99 mg/dL   Comment 1 Notify RN    Comment 2 Document in Chart   Glucose, capillary     Status: Abnormal   Collection Time: 01/18/18  1:12 PM  Result Value Ref Range   Glucose-Capillary 120 (H) 70 - 99 mg/dL   Comment 1 Notify RN    Comment 2 Document in Chart     MICRO: 8/1 blood cx 3/4 MRSA 8/2 blood cx pending IMAGING: Ct Abdomen Pelvis Wo Contrast  Result Date: 02/13/2018 CLINICAL DATA:  Abdominal pain. Clinical concern for gastroenteritis or colitis. EXAM: CT ABDOMEN AND PELVIS WITHOUT CONTRAST TECHNIQUE: Multidetector CT imaging of the abdomen and pelvis was performed following the standard protocol without IV  contrast. COMPARISON:  Abdomen and pelvis radiographs dated 11/17/2017 and abdomen and pelvis CT dated 02/07/2016. Abdomen ultrasound dated 08/19/2017. FINDINGS: Lower chest: Interval dependent atelectasis in the right lower lobe. Hepatobiliary: Mild sludge in the gallbladder. Normal appearing liver. Pancreas: Diffuse atrophy with partial fatty replacement. Spleen: Normal in size without focal abnormality. Adrenals/Urinary Tract: Adrenal glands are unremarkable. Kidneys are normal, without renal calculi, focal lesion, or hydronephrosis. Bladder is unremarkable. Stomach/Bowel: Multiple right colon diverticula without evidence of diverticulitis. Interval mild diffuse rectal wall thickening. Normal appearing appendix, stomach and small bowel. Vascular/Lymphatic: Atheromatous arterial calcifications. And wrist small dilatation of the proximal infrarenal abdominal aorta with a more prominent focal out pouching posterolaterally on the left. The maximum total diameter, including the focal outpouching, is 4.2 cm, previously 3.6 cm. The focal outpouching is rounded and measures 1.5 cm in maximum diameter on coronal image number 57 and 1.6 cm in maximum diameter on axial image number 29 series 2. Also demonstrated is mild aneurysmal dilatation  of the left common iliac artery with a maximum AP diameter 1.8 cm on image number 52 series 2. No enlarged lymph nodes. Reproductive: Mildly and enlarged prostate gland containing coarse calcifications. Other: Small bilateral inguinal hernias containing fat. Musculoskeletal: Bilateral hip degenerative changes. Lumbar and lower thoracic spine degenerative changes. IMPRESSION: 1. 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 The maximum combined diameter is 4.2 cm, increased from 3.6 cm. Recommend vascular surgery consultation due to the increased size of the more focal rounded outpouching,  currently measuring 1.6 cm. 2. 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 3. Interval mild diffuse rectal wall thickening, suggesting mild proctitis. Right colon diverticulosis without evidence of diverticulitis. 4. Mild sludge in the gallbladder. 5. Diffuse pancreatic atrophy. 6. Right lower lobe dependent atelectasis. Electronically Signed   By: Claudie Revering M.D.   On: 01/30/2018 13:45   Dg Chest 2 View  Result Date: 01/21/2018 CLINICAL DATA:  Mental status change EXAM: CHEST - 2 VIEW COMPARISON:  11/17/2017 FINDINGS: The heart size and mediastinal contours are within normal limits. Both lungs are clear. The visualized skeletal structures are unremarkable. IMPRESSION: No active cardiopulmonary disease. Electronically Signed   By: Franchot Gallo M.D.   On: 02/12/2018 12:59   Ct Head Wo Contrast  Result Date: 02/05/2018 CLINICAL DATA:  Unresponsive, encephalopathy EXAM: CT HEAD WITHOUT CONTRAST TECHNIQUE: Contiguous axial images were obtained from the base of the skull through the vertex without intravenous contrast. COMPARISON:  None. FINDINGS: Brain: No evidence of acute infarction, hemorrhage, extra-axial collection or mass lesion/mass effect. Subcortical white matter and periventricular small vessel ischemic changes. Cortical and central atrophy.  No ventriculomegaly. Vascular: Mild intracranial atherosclerosis. Skull: Normal. Negative for fracture or focal lesion. Sinuses/Orbits: The visualized paranasal sinuses are essentially clear. The mastoid air cells are unopacified. Other: None. IMPRESSION: No evidence of acute intracranial abnormality. Atrophy with small vessel ischemic changes. Electronically Signed   By: Julian Hy M.D.   On: 02/05/2018 18:25   Dg Chest Port 1 View  Result Date: 01/18/2018 CLINICAL DATA:  Sepsis and shortness of breath. EXAM: PORTABLE CHEST 1 VIEW COMPARISON:  02/13/2018 FINDINGS: The heart size is stable and normal.  Stable tortuosity of the thoracic aorta. Mild new opacity at the right lung base represents either atelectasis or developing infiltrate. There is no evidence of pulmonary edema, pneumothorax, nodule or pleural fluid. Both lungs are clear. The visualized skeletal structures are unremarkable. IMPRESSION: Mild new opacity at the right lung base representing either atelectasis or developing pneumonia. Electronically Signed   By: Aletta Edouard M.D.   On: 01/18/2018 08:34   Assessment/Plan:  84yo M with CHF, CKD admitted for AMS, abdominal pain, dehydration found to have hypernatremia, AKI, and MRSA bacteremia - not convinced that he has proctitis. Would expect that he presents with rectal pain. Imaging shows only enlarged prostate which would expect in octogenarian. However, his exam he does have distention and abdominal discomfort  - source of MRSA bacteremia could potentially be his skin-interrupted integument from his rash - records suggests it has been there from february. It has the appearance of being drug allergy based rash.  - if no further abdominal symptoms noted tomorrow, recommend to scale back his abtx to just vancomycin - based on how he does over night.  AMS = due to bacteremia, possibly his hypernatremia. Continue to monitor  Abdominal pain = CT  shows signs of diverticulosis but not diverticulitis. If no new source of GI related infection, would narrow his abtx tomorrow  MRSA bacteremia = continue on vancomycin for now. Repeat blood cx are pending. Await read on his TTE.  Desquamation/rash = would ask wound care if patient would benefit from moisture barrier. It does not appear to be related to antibiotics. Recommend to get records from dermatology to see if they did skin biopsy or cause for rash. May consider switching to a diuretic sans sulfa-moiety (lasix related to sulfa)  Overall prognosis is guarded  Dr Tommy Medal to see tomorrow to provide further recs

## 2018-01-18 NOTE — Progress Notes (Signed)
PHARMACY - PHYSICIAN COMMUNICATION CRITICAL VALUE ALERT - BLOOD CULTURE IDENTIFICATION (BCID)  Greg Jones is an 84 y.o. male who [facility] with a chief complaint of abdominal pain, proctitis  Assessment:  MRSA bacteremia  Name of physician (or Provider) ContactedRadonna Jones: Feliz  Current antibiotics: Cefepime and Flagyl  Changes to prescribed antibiotics recommended:  Add vancomycin per pharmacy protocol, continue cefepime/flagyl  Results for orders placed or performed during the hospital encounter of 11-13-2017  Blood Culture ID Panel (Reflexed) (Collected: 02/13/2018 11:40 AM)  Result Value Ref Range   Enterococcus species NOT DETECTED NOT DETECTED   Listeria monocytogenes NOT DETECTED NOT DETECTED   Staphylococcus species DETECTED (A) NOT DETECTED   Staphylococcus aureus DETECTED (A) NOT DETECTED   Methicillin resistance DETECTED (A) NOT DETECTED   Streptococcus species NOT DETECTED NOT DETECTED   Streptococcus agalactiae NOT DETECTED NOT DETECTED   Streptococcus pneumoniae NOT DETECTED NOT DETECTED   Streptococcus pyogenes NOT DETECTED NOT DETECTED   Acinetobacter baumannii NOT DETECTED NOT DETECTED   Enterobacteriaceae species NOT DETECTED NOT DETECTED   Enterobacter cloacae complex NOT DETECTED NOT DETECTED   Escherichia coli NOT DETECTED NOT DETECTED   Klebsiella oxytoca NOT DETECTED NOT DETECTED   Klebsiella pneumoniae NOT DETECTED NOT DETECTED   Proteus species NOT DETECTED NOT DETECTED   Serratia marcescens NOT DETECTED NOT DETECTED   Haemophilus influenzae NOT DETECTED NOT DETECTED   Neisseria meningitidis NOT DETECTED NOT DETECTED   Pseudomonas aeruginosa NOT DETECTED NOT DETECTED   Candida albicans NOT DETECTED NOT DETECTED   Candida glabrata NOT DETECTED NOT DETECTED   Candida krusei NOT DETECTED NOT DETECTED   Candida parapsilosis NOT DETECTED NOT DETECTED   Candida tropicalis NOT DETECTED NOT DETECTED    Berkley HarveyLegge, Madgie Dhaliwal Marshall 01/18/2018   9:04 AM

## 2018-01-18 NOTE — Consult Note (Signed)
WOC Nurse wound consult note Reason for Consult: "Stage 1 pressure injuries" bilateral heels per MD referral LE area cool and has some purplish discoloration over the entire body Wound type: Peeling skin with partial thickness skin loss in several areas Unstageable pressure injuries left and right heel  Deep tissue pressure injury sacrum Concern for SJS, however it is noted that has had a macular papular rash as outpatient and was on triamcinolone cream.   Pressure Injury POA: yes  Measurement: Sacrum: 5cm x 7cm x 0.1 100% non blanchable purple tissue with small area of partial thickness skin loss centrally. Right heel: 100% black stable eschar; 3cm x 3.5cm x 0cm  Left heel: 100% eschar; 5cm x 3.5cm x 0cm Wound bed: see above Drainage (amount, consistency, odor) none, however he has some weeping from some of the skin loss from this systemic rash Periwound: entire body is affected with superficial skin peeling and loss  Dressing procedure/placement/frequency: Off load heels with Prevalon boots bilaterally Paint heels with betadine, poor perfusion. Unable to obtain ABI/TBI.  Sacral foam, assess for acute changes under dressing each shift.  Patient is unresponsive to me but jerks and moans with movement, grimaces and opens eyes widely.  No verbal response beside moaning.  Palliative care meeting scheduled.    Discussed POC with bedside nurse, no family in the room  Re consult if needed, will not follow at this time. Thanks  Marcio Hoque M.D.C. [employer] MSN, RN,CWOCN, CNS, CWON-AP [phone])

## 2018-01-18 NOTE — Progress Notes (Signed)
  Echocardiogram 2D Echocardiogram has been performed.  Janalyn HarderWest, Dauntae Derusha R 01/18/2018, 2:36 PM

## 2018-01-18 NOTE — Progress Notes (Signed)
Initial Nutrition Assessment  DOCUMENTATION CODES:   (Will monitor for malnutrition at follow-up.)  INTERVENTION:  - Diet advancement as medically feasible. - Will order appropriate oral nutrition supplements with diet advancement.   NUTRITION DIAGNOSIS:   Inadequate oral intake related to inability to eat as evidenced by NPO status.  GOAL:   Patient will meet greater than or equal to 90% of their needs  MONITOR:   Diet advancement, Weight trends, Labs, Skin  REASON FOR ASSESSMENT:   Low Braden  ASSESSMENT:   84 y.o. male past medical history of chronic kidney disease, COPD hypertension presented with sepsis and abdominal pain in the ED a CT scan was done that showed evidence of proctitis  BMI indicates normal weight. Patient has been NPO since admission. No family/visitors at bedside and patient noted to be disoriented, not following commands, with poor attention/concentration, and incomprehensible speech. RN reports that patient has pain when touched and hollers out. Two RNs at bedside at this time attempting to place Foley.   Per Dr. Louanne BeltonFeliz's note this AM: sepsis d/t proctitis, plan to advance diet, AKI on CKD stage 3, Lasix being held, not a candidate for dialysis, transaminitis d/t dehydration and sepsis, acute toxic encephalopathy, chronic rash.  Medications reviewed; 10 mg IV Pepcid BID, sliding scale Novolog.  Labs reviewed; CBG: 112 mg/dL this AM, Na: 161158 mmol/L, Cl: 121 mmol/L, BUN: 86 mg/dL, creatinine: 0.965.55 mg/dL, Ca: 8.3 mg/dL, GFR: 8 mL/min.  IVF: D5-1/2 NS @ 150 mL/hr (612 kcal).    NUTRITION - FOCUSED PHYSICAL EXAM:  Unable to complete at this time; will monitor for ability at follow-up.   Diet Order:   Diet Order           Diet NPO time specified  Diet effective now          EDUCATION NEEDS:   Not appropriate for education at this time  Skin:  Skin Assessment: Skin Integrity Issues: Skin Integrity Issues:: Stage II, Unstageable Stage II:  sacrum Unstageable: full thickness to bilateral heels  Last BM:  8/2  Height:   Ht Readings from Last 1 Encounters:  01/18/18 5\' 8"  (1.727 m)    Weight:   Wt Readings from Last 1 Encounters:  01/18/18 153 lb (69.4 kg)    Ideal Body Weight:  70 kg  BMI:  Body mass index is 23.26 kg/m.  Estimated Nutritional Needs:   Kcal:  1735-1945 (25-28 kcal/kg)  Protein:  90-105 grams (1.3-1.5 grams/kg)  Fluid:  >/= 2 L/day     Trenton GammonJessica Joan Herschberger, MS, RD, LDN, [facility] Inpatient Clinical Dietitian Pager # [phone] After hours/weekend pager # [phone]

## 2018-01-18 NOTE — Consult Note (Signed)
Consultation Note Date: 01/18/2018   Patient Name: Greg Jones  DOB: Jun 15, 1928  MRN: [id number]  Age / Sex: 84 y.o., male  PCP: Margit Hanks, MD Referring Physician: Marinda Elk, MD  Reason for Consultation: Establishing goals of care  HPI/Patient Profile: 84 y.o. male  with past medical history of COPD, CKD, CHF, HTN long term care resident admitted on 01/25/2018 with altered mental status, hypernatremia, AKI, and MRSA bacteremia.   Clinical Assessment and Goals of Care: Chart reviewed including pertinent labs and imaging. Discussed at length with bedside RN.  Patient seen and examined.  Not able to give history but intermittent moaning.  I called and discussed with his sister, Greg Jones.  Discussed my concern that he is uncomfortable and discussed plan to increase pain medication.  We have made paln for family meeting tomorrow afternoon.  Family to call/have nurse page when they arrive.  SUMMARY OF RECOMMENDATIONS   - Plan for family meeting tomorrow.  Code Status/Advance Care Planning:  DNR   Symptom Management:   Pain: increase fentanyl to 12.5 to Q2 hours as needed.  Prognosis:   guarded  Discharge Planning: To Be Determined      Primary Diagnoses: Present on Admission: . Sepsis (HCC) . Type 2 diabetes mellitus with other specified complication (HCC) . CKD (chronic kidney disease), stage IV (HCC) . Chronic combined systolic and diastolic CHF (congestive heart failure) (HCC) . Essential hypertension   I have reviewed the medical record, interviewed the patient and family, and examined the patient. The following aspects are pertinent.  Past Medical History:  Diagnosis Date  . CHF (congestive heart failure) (HCC)   . Chronic kidney disease   . COPD (chronic obstructive pulmonary disease) (HCC)   . GERD (gastroesophageal reflux disease)   . Hypertension    . Respiratory insufficiency    Social History   Socioeconomic History  . Marital status: Widowed    Spouse name: Not on file  . Number of children: Not on file  . Years of education: Not on file  . Highest education level: Not on file  Occupational History  . Not on file  Social Needs  . Financial resource strain: Not on file  . Food insecurity:    Worry: Not on file    Inability: Not on file  . Transportation needs:    Medical: Not on file    Non-medical: Not on file  Tobacco Use  . Smoking status: Former Smoker    Packs/day: 0.50    Years: 70.00    Pack years: 35.00    Types: Cigarettes    Last attempt to quit: 04/19/2013    Years since quitting: 4.7  . Smokeless tobacco: Never Used  Substance and Sexual Activity  . Alcohol use: No    Alcohol/week: 0.0 oz  . Drug use: No  . Sexual activity: Never  Lifestyle  . Physical activity:    Days per week: Not on file    Minutes per session: Not on file  . Stress: Not  on file  Relationships  . Social connections:    Talks on phone: Not on file    Gets together: Not on file    Attends religious service: Not on file    Active member of club or organization: Not on file    Attends meetings of clubs or organizations: Not on file    Relationship status: Not on file  Other Topics Concern  . Not on file  Social History Narrative   Widowed.     Former smoker, quit in 2014.   Ambulates with a rollator.     Family History  Problem Relation Age of Onset  . Tuberculosis Mother   . Diabetes Father    Scheduled Meds: . albuterol  2.5 mg Nebulization Q6H  . ceFEPime (MAXIPIME) IV  500 mg Intravenous Q24H  . chlorhexidine  15 mL Mouth Rinse BID  . Chlorhexidine Gluconate Cloth  6 each Topical Q0600  . heparin  5,000 Units Subcutaneous Q8H  . insulin aspart  0-5 Units Subcutaneous QHS  . insulin aspart  0-9 Units Subcutaneous TID WC  . mouth rinse  15 mL Mouth Rinse q12n4p  . mometasone-formoterol  2 puff Inhalation BID    . mupirocin ointment  1 application Nasal BID  . tiotropium  18 mcg Inhalation Q24H   Continuous Infusions: . dextrose 5 % and 0.45% NaCl 150 mL/hr at 01/18/18 1839  . famotidine (PEPCID) IV Stopped (01/18/18 1337)  . metronidazole Stopped (01/18/18 1501)   PRN Meds:.albuterol, fentaNYL (SUBLIMAZE) injection, labetalol, ondansetron **OR** ondansetron (ZOFRAN) IV Medications Prior to Admission:  Prior to Admission medications   Medication Sig Start Date End Date Taking? Authorizing Provider  albuterol (PROVENTIL) (2.5 MG/3ML) 0.083% nebulizer solution Take 3 mLs (2.5 mg total) by nebulization every 6 (six) hours. DX:  J44.9 03/23/17  Yes Oretha MilchAlva, Rakesh V, MD  allopurinol (ZYLOPRIM) 100 MG tablet Take 100 mg by mouth daily. X 1 week then beginning on 01/18/18 give 2 tablets (200 mg) by mouth daily 01/10/18 01/18/2018 Yes [provider]  amLODipine (NORVASC) 10 MG tablet Take 10 mg by mouth daily.   Yes [provider]  aspirin EC 81 MG tablet Take 81 mg by mouth daily.   Yes [provider]  budesonide-formoterol (SYMBICORT) 160-4.5 MCG/ACT inhaler Inhale 2 puffs into the lungs 2 (two) times daily.   Yes [provider]  calcitRIOL (ROCALTROL) 0.25 MCG capsule Take 0.25 mcg by mouth every other day.   Yes [provider]  cetirizine (ZYRTEC ALLERGY) 10 MG tablet Take 10 mg by mouth daily.   Yes [provider]  colchicine 0.6 MG tablet Take 0.6 mg by mouth daily. 01/11/18  Yes [provider]  fluconazole (DIFLUCAN) 100 MG tablet Take 100 mg by mouth daily.   Yes [provider]  furosemide (LASIX) 20 MG tablet Take 20 mg by mouth. take 3 tabs (60 mg) po daily for CHF   Yes [provider]  GLUCERNA (GLUCERNA) LIQD Take 237 mLs by mouth 3 (three) times daily between meals. GIVE 1 CAN (237 Ml) WITH EACH MEAL AS SUPPLEMENT    Yes [provider]  hydrOXYzine (ATARAX/VISTARIL) 25 MG tablet Take 25 mg by mouth every 6  (six) hours. Itching   Yes [provider]  insulin aspart (NOVOLOG) 100 UNIT/ML injection Inject 2-15 Units into the skin 3 (three) times daily before meals. SQ: 121-150= 2 units, 151-200= 3 units, 201-250= 5 units, 251-300= 8 units, 301-350= 11 units, 351-400=15 units, Greater  than 400=15 units and Call MD   Yes [provider]  ipratropium-albuterol (DUONEB) 0.5-2.5 (3) MG/3ML SOLN Take 3 mLs by nebulization 2 (two) times daily.    Yes [provider]  isosorbide mononitrate (IMDUR) 30 MG 24 hr tablet Take 30 mg by mouth daily.   Yes [provider]  LORazepam (ATIVAN) 0.5 MG tablet Take 0.5 mg by mouth 2 (two) times daily.   Yes [provider]  Multiple Vitamins-Minerals (MULTIVITAMIN WITH MINERALS) tablet Take 1 tablet by mouth daily. Give 1 tablet by mouth qd to promote wound healing   Yes [provider]  omeprazole (PRILOSEC) 20 MG capsule Take 20 mg by mouth daily.   Yes [provider]  ranitidine (ZANTAC) 75 MG tablet Take 75 mg by mouth 2 (two) times daily. Take 2 tablets (300 mg) by mouth daily for itching   Yes [provider]  senna (SENOKOT) 8.6 MG TABS tablet Take 2 tablets by mouth every morning.   Yes [provider]  tiotropium (SPIRIVA) 18 MCG inhalation capsule Place 18 mcg into inhaler and inhale daily.   Yes [provider]  triamcinolone cream (KENALOG) 0.1 % Apply 1 application topically. apply to affected areas two times daily. Apply to affected areas of trunk and extremities   Yes [provider]   Allergies  Allergen Reactions  . Other Other (See Comments)    Oxygen Sensitive  . Penicillins Rash and Other (See Comments)    Any -illins as well as Mycins Has patient had a PCN reaction causing immediate rash, facial/tongue/throat swelling, SOB or lightheadedness with hypotension: Yes Has patient had a PCN reaction causing severe rash involving mucus membranes or skin  necrosis: Yes Has patient had a PCN reaction that required hospitalization No Has patient had a PCN reaction occurring within the last 10 years: No If all of the above answers are "NO", then may proceed with Cephalosporin use.   . Vitamin B12 Itching, Rash and Other (See Comments)    injections   Review of Systems Unable to obtain  Physical Exam  General: Intermittent moaning.  HEENT: No bruits, no goiter, no JVD Heart: Regular rate and rhythm. No murmur appreciated. Lungs: Good air movement, clear Abdomen: Soft, mild tender, distended, positive bowel sounds.  Ext: No significant edema Skin: Warm and dry. Exfoliating skin rash. Neuro: Grossly intact, nonfocal.   Vital Signs: BP (!) 165/121   Pulse 93   Temp (!) 97.3 F (36.3 C) (Axillary)   Resp (!) 30   Ht 5\' 8"  (1.727 m)   Wt 69.4 kg (153 lb)   SpO2 100%   BMI 23.26 kg/m  Pain Scale: PAINAD   Pain Score: Asleep   SpO2: SpO2: 100 % O2 Device:SpO2: 100 % O2 Flow Rate: .O2 Flow Rate (L/min): 2 L/min  IO: Intake/output summary:   Intake/Output Summary (Last 24 hours) at 01/18/2018 2138 Last data filed at 01/18/2018 1900 Gross per 24 hour  Intake 2168.5 ml  Output 250 ml  Net 1918.5 ml    LBM: Last BM Date: (UTA) Baseline Weight: Weight: 69.4 kg (153 lb) Most recent weight: Weight: 69.4 kg (153 lb)     Palliative Assessment/Data:   Flowsheet Rows     Most Recent Value  Intake Tab  Referral Department  Hospitalist  Unit at Time of Referral  ICU  Palliative Care Primary Diagnosis  Sepsis/Infectious Disease  Date Notified  01/18/18  Palliative Care Type  New Palliative care  Reason for referral  Clarify Goals of Care  Date of Admission  02/10/2018  Date first seen by Palliative Care  01/18/18  # of days Palliative referral response time  0 Day(s)  # of days IP prior to Palliative referral  1  Clinical Assessment  Palliative Performance Scale Score  10%  Psychosocial & Spiritual Assessment  Palliative  Care Outcomes     Time Total: 55 minutes Greater than 50%  of this time was spent counseling and coordinating care related to the above assessment and plan.  Signed by: Romie Minus, MD   Please contact Palliative Medicine Team phone at [phone] for questions and concerns.  For individual provider: See Loretha Stapler

## 2018-01-18 NOTE — Progress Notes (Signed)
Pharmacy Antibiotic Note  Greg Jones is a 84 y.o. male admitted on 02/08/2018 with proctitis and MRSA bacteremiaPharmacy has been consulted for Vancomycin dosing.  Plan: 1) Continue current cefepime dosing 2) Vancomycin 500mg  IV x 1, will order a random vanc level for tomorrow AM to assess if patient actually clearing drug in setting of acute renal failure 3) Could consider Zyvox in this setting, will await ID note  Weight: 153 lb (69.4 kg)  Temp (24hrs), Avg:97.9 F (36.6 C), Min:97 F (36.1 C), Max:99 F (37.2 C)  Recent Labs  Lab 01/14/18 02/07/2018 1217 01/25/2018 1220 02/11/2018 1413 02/04/2018 1734 02/07/2018 2039 01/18/18 0253  WBC  --  14.9*  --   --   --   --  9.5  CREATININE 3.9* 6.02*  --   --  5.71*  --  5.55*  LATICACIDVEN  --   --  6.93* 5.43* 3.3* 3.8*  --     Estimated Creatinine Clearance: 8.9 mL/min (A) (by C-G formula based on SCr of 5.55 mg/dL (H)).    Allergies  Allergen Reactions  . Other Other (See Comments)    Oxygen Sensitive  . Penicillins Rash and Other (See Comments)    Any -illins as well as Mycins Has patient had a PCN reaction causing immediate rash, facial/tongue/throat swelling, SOB or lightheadedness with hypotension: Yes Has patient had a PCN reaction causing severe rash involving mucus membranes or skin necrosis: Yes Has patient had a PCN reaction that required hospitalization No Has patient had a PCN reaction occurring within the last 10 years: No If all of the above answers are "NO", then may proceed with Cephalosporin use.   . Vitamin B12 Itching, Rash and Other (See Comments)    injections    Thank you for allowing pharmacy to be a part of this patient's care.  Berkley HarveyLegge, Achsah Mcquade Marshall 01/18/2018 9:15 AM

## 2018-01-18 NOTE — Progress Notes (Signed)
[facility] PROGRESS NOTE    Progress Note  Greg Jones  ZOX:[id number] DOB: 1928-03-19 DOA: 02-16-18 PCP: Margit Hanks, MD     Brief Narrative:   Greg Jones is an 84 y.o. male past medical history of chronic kidney disease, COPD hypertension presented with sepsis and abdominal pain in the ED a CT scan was done that showed evidence of proctitis  Assessment/Plan:   Sepsis due to Proctitis: Started empirically on sepsis pathway on IV antibiotics and IV fluids. I agree with IV cefepime and Flagyl, culture data is pending.  Allow diet. Lactic acidosis is slowly improving, his leukocytosis is also improving. He has remained afebrile.  Acute kidney injury on chronic kidney disease stage III/: Likely prerenal in the setting of sepsis and ongoing Lasix use. Lasix was held, his baseline creatinine is around 2.0 on admission 6.0, after IV fluid hydration his creatinine is 5.5.  He is not a candidate for dialysis will continue IV fluid hydration. Insert Foley continue strict I's and O's hold all nephrotoxic drugs.  Transaminitis: The setting of dehydration and sepsis.  Acute toxic encephalopathy: Likely due to sepsis, he continues to be confused unresponsive. As per family member he is a DNR/DNI. But his condition has been deteriorating over the last month. At baseline he can carry on a conversation and is lucid.  Rash: Diffuse macular rash as per family and documentation it seems to be chronic.  Is on triamcinolone cream.  Pressure ulcer bilateral of the heels: Wound care has been consulted to try to relieve pressure.  Diabetes mellitus type 2: Continue sliding scale insulin. Last A1c was 8 on 12/2017.  Chronic diastolic and systolic heart failure: Continue strict I's and O's he appears to be dry, making urine but poor urine output recorded. He is contracted cannot evaluate his fluid status will place a PICC line check CVP every 12's. Abdominal aortic  aneurysm: We will need to follow-up with vascular as an outpatient.  Essential hypertension Seems to be slightly high we will try to keep it on the high side due to his new acute renal failure, will use labetalol for systolic blood pressure greater than 190.  Stage I pressure ulcer of heels: WOC    Hypovolemic hypernatremia Slowly improving, will change his IV fluids to half-normal saline with D5.  Keep him n.p.o.   DVT prophylaxis: lovenox Family Communication:Sister Disposition Plan/Barrier to D/C: unable to determine Code Status:     Code Status Orders  (From admission, onward)        Start     Ordered   Feb 16, 2018 1717  Full code  Continuous     02-16-2018 1716    Code Status History    Date Active Date Inactive Code Status Order ID Comments User Context   11/12/2017 1510 11/17/2017 1842 Full Code [id number]  Alberteen Sam, MD ED   10/18/2017 2315 10/25/2017 2105 Full Code [id number]  Jonah Blue, MD Inpatient   08/14/2017 2209 08/22/2017 1627 Full Code [id number]  Clydie Braun, MD ED   09/07/2016 1820 09/11/2016 1721 Full Code [id number]  Orpah Cobb, MD Inpatient   04/11/2016 1850 04/12/2016 1950 Full Code [id number]  Albertine Grates, MD Inpatient   03/15/2016 1839 03/18/2016 1539 Full Code [id number]  Orpah Cobb, MD Inpatient   09/07/2015 1554 09/14/2015 1712 Full Code [id number]  Parrett, Virgel Bouquet, NP Inpatient        IV Access:    Peripheral IV   Procedures and diagnostic studies:   Ct Abdomen  Pelvis Wo Contrast  Result Date: 01/20/2018 CLINICAL DATA:  Abdominal pain. Clinical concern for gastroenteritis or colitis. EXAM: CT ABDOMEN AND PELVIS WITHOUT CONTRAST TECHNIQUE: Multidetector CT imaging of the abdomen and pelvis was performed following the standard protocol without IV contrast. COMPARISON:  Abdomen and pelvis radiographs dated 11/17/2017 and abdomen and pelvis CT dated 02/07/2016. Abdomen ultrasound dated 08/19/2017. FINDINGS: Lower chest: Interval dependent  atelectasis in the right lower lobe. Hepatobiliary: Mild sludge in the gallbladder. Normal appearing liver. Pancreas: Diffuse atrophy with partial fatty replacement. Spleen: Normal in size without focal abnormality. Adrenals/Urinary Tract: Adrenal glands are unremarkable. Kidneys are normal, without renal calculi, focal lesion, or hydronephrosis. Bladder is unremarkable. Stomach/Bowel: Multiple right colon diverticula without evidence of diverticulitis. Interval mild diffuse rectal wall thickening. Normal appearing appendix, stomach and small bowel. Vascular/Lymphatic: Atheromatous arterial calcifications. And wrist small dilatation of the proximal infrarenal abdominal aorta with a more prominent focal out pouching posterolaterally on the left. The maximum total diameter, including the focal outpouching, is 4.2 cm, previously 3.6 cm. The focal outpouching is rounded and measures 1.5 cm in maximum diameter on coronal image number 57 and 1.6 cm in maximum diameter on axial image number 29 series 2. Also demonstrated is mild aneurysmal dilatation of the left common iliac artery with a maximum AP diameter 1.8 cm on image number 52 series 2. No enlarged lymph nodes. Reproductive: Mildly and enlarged prostate gland containing coarse calcifications. Other: Small bilateral inguinal hernias containing fat. Musculoskeletal: Bilateral hip degenerative changes. Lumbar and lower thoracic spine degenerative changes. IMPRESSION: 1. 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 The maximum combined diameter is 4.2 cm, increased from 3.6 cm. Recommend vascular surgery consultation due to the increased size of the more focal rounded outpouching, currently measuring 1.6 cm. 2. 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 3. Interval mild diffuse rectal wall thickening, suggesting mild  proctitis. Right colon diverticulosis without evidence of diverticulitis. 4. Mild sludge in the gallbladder. 5. Diffuse pancreatic atrophy. 6. Right lower lobe dependent atelectasis. Electronically Signed   By: Beckie Salts M.D.   On: 01/27/2018 13:45   Dg Chest 2 View  Result Date: 02/13/2018 CLINICAL DATA:  Mental status change EXAM: CHEST - 2 VIEW COMPARISON:  11/17/2017 FINDINGS: The heart size and mediastinal contours are within normal limits. Both lungs are clear. The visualized skeletal structures are unremarkable. IMPRESSION: No active cardiopulmonary disease. Electronically Signed   By: Marlan Palau M.D.   On: 01/20/2018 12:59   Ct Head Wo Contrast  Result Date: 02/15/2018 CLINICAL DATA:  Unresponsive, encephalopathy EXAM: CT HEAD WITHOUT CONTRAST TECHNIQUE: Contiguous axial images were obtained from the base of the skull through the vertex without intravenous contrast. COMPARISON:  None. FINDINGS: Brain: No evidence of acute infarction, hemorrhage, extra-axial collection or mass lesion/mass effect. Subcortical white matter and periventricular small vessel ischemic changes. Cortical and central atrophy.  No ventriculomegaly. Vascular: Mild intracranial atherosclerosis. Skull: Normal. Negative for fracture or focal lesion. Sinuses/Orbits: The visualized paranasal sinuses are essentially clear. The mastoid air cells are unopacified. Other: None. IMPRESSION: No evidence of acute intracranial abnormality. Atrophy with small vessel ischemic changes. Electronically Signed   By: Charline Bills M.D.   On: 01/18/2018 18:25     Medical Consultants:    None.  Anti-Infectives:   IV cefepime and flagyl  Subjective:    Cherlyn Roberts non verbal  Objective:  Vitals:   01/18/18 0042 01/18/18 0338 01/18/18 0400 01/18/18 0500  BP:      Pulse:   76   Resp:   20   Temp:  (!) 97.4 F (36.3 C)    TempSrc:  Axillary    SpO2: 100%  100%   Weight:    69.4 kg (153 lb)    Intake/Output  Summary (Last 24 hours) at 01/18/2018 0701 Last data filed at 01/18/2018 0000 Gross per 24 hour  Intake 3016.66 ml  Output 0 ml  Net 3016.66 ml   Filed Weights   01/18/18 0500  Weight: 69.4 kg (153 lb)    Exam: General exam: In no acute distress. Respiratory system: Good air movement and clear to auscultation. Cardiovascular system: S1 & S2 heard, RRR. No JVD. Gastrointestinal system: Abdomen is nondistended, soft and tenderness. Central nervous system: Alert and oriented. No focal neurological deficits. Extremities: No pedal edema. Skin: scaling abd desquamative. Psychiatry: Judgement and insight appear normal. Mood & affect appropriate.    Data Reviewed:    Labs: Basic Metabolic Panel: Recent Labs  Lab 01/14/18 02/10/2018 1217 02/07/2018 1734 01/18/18 0253  NA 150* 160* 158* 158*  K 3.9 6.0* 5.5* 5.0  CL  --  114* 120* 121*  CO2  --  23 22 20*  GLUCOSE  --  231* 192* 128*  BUN 50* 82* 80* 86*  CREATININE 3.9* 6.02* 5.71* 5.55*  CALCIUM  --  9.5 8.4* 8.3*  MG  --   --   --  2.4   GFR Estimated Creatinine Clearance: 8.9 mL/min (A) (by C-G formula based on SCr of 5.55 mg/dL (H)). Liver Function Tests: Recent Labs  Lab 02/08/2018 1217 01/18/18 0253  AST 80* 74*  ALT 59* 47*  ALKPHOS 259* 184*  BILITOT 0.7 0.4  PROT 8.0 6.5  ALBUMIN 2.5* 2.1*   Recent Labs  Lab 01/28/2018 1217  LIPASE 21   No results for input(s): AMMONIA in the last 168 hours. Coagulation profile No results for input(s): INR, PROTIME in the last 168 hours.  CBC: Recent Labs  Lab 01/28/2018 1217 01/18/18 0253  WBC 14.9* 9.5  NEUTROABS 12.9*  --   HGB 12.8* 10.4*  HCT 41.2 33.1*  MCV 83.1 80.9  PLT 387 288   Cardiac Enzymes: No results for input(s): CKTOTAL, CKMB, CKMBINDEX, TROPONINI in the last 168 hours. BNP (last 3 results) No results for input(s): PROBNP in the last 8760 hours. CBG: Recent Labs  Lab 02/03/2018 11-10-32  GLUCAP 162*   D-Dimer: No results for input(s): DDIMER in the  last 72 hours. Hgb A1c: No results for input(s): HGBA1C in the last 72 hours. Lipid Profile: No results for input(s): CHOL, HDL, LDLCALC, TRIG, CHOLHDL, LDLDIRECT in the last 72 hours. Thyroid function studies: Recent Labs    02/08/2018 Nov 10, 2037  TSH 2.720   Anemia work up: No results for input(s): VITAMINB12, FOLATE, FERRITIN, TIBC, IRON, RETICCTPCT in the last 72 hours. Sepsis Labs: Recent Labs  Lab 02/11/2018 1217 01/21/2018 1220 01/22/2018 1413 01/22/2018 1734 02/11/2018 11/10/37 01/18/18 0253  WBC 14.9*  --   --   --   --  9.5  LATICACIDVEN  --  6.93* 5.43* 3.3* 3.8*  --    Microbiology Recent Results (from the past 240 hour(s))  Culture, blood (routine x 2)     Status: None (Preliminary result)   Collection Time: 01/18/2018 11:40 AM  Result Value Ref Range Status   Specimen Description   Final    BLOOD  RIGHT ANTECUBITAL Performed at [facility], [address] [place], [place] [address]    Special Requests   Final    BOTTLES DRAWN AEROBIC AND ANAEROBIC Blood Culture adequate volume Performed at [facility], [address] [place], [place] [address]    Culture  Setup Time   Final    AEROBIC BOTTLE ONLY GRAM POSITIVE COCCI Organism ID to follow Performed at [facility], [address] [place], [place] [address]    Culture PENDING  Incomplete   Report Status PENDING  Incomplete  MRSA PCR Screening     Status: Abnormal   Collection Time: 2018/01/04  6:45 PM  Result Value Ref Range Status   MRSA by PCR POSITIVE (A) NEGATIVE Final    Comment:        The GeneXpert MRSA Assay (FDA approved for NASAL specimens only), is one component of a comprehensive MRSA colonization surveillance program. It is not intended to diagnose MRSA infection nor to guide or monitor treatment for MRSA infections. RESULT CALLED TO, READ BACK BY AND VERIFIED WITH: MCMILLIAN,S AT 2005 ON 02/02/2018 BY MOSLEY,J Performed at [facility], [address] [place], [place] [address]      Medications:   . albuterol  2.5 mg Nebulization Q6H  . ceFEPime (MAXIPIME) IV  500 mg Intravenous Q24H  . chlorhexidine  15 mL Mouth Rinse BID  . heparin  5,000 Units Subcutaneous Q8H  . insulin aspart  0-5 Units Subcutaneous QHS  . insulin aspart  0-9 Units Subcutaneous TID WC  . mouth rinse  15 mL Mouth Rinse q12n4p  . mometasone-formoterol  2 puff Inhalation BID  . tiotropium  18 mcg Inhalation Q24H   Continuous Infusions: . sodium chloride 200 mL/hr at 01/18/18 0425  . famotidine (PEPCID) IV Stopped (2018/01/04 2301)  . metronidazole Stopped (01/18/18 [id number])      LOS: 1 day   Marinda ElkAbraham Feliz Ortiz  [facility] Pager [phone]  *Please refer to [email or web address], password [email or web address] to get updated schedule on who will round on this patient, as hospitalists switch teams weekly. If 7PM-7AM, please contact night-coverage at [email or web address], password [email or web address] for any overnight needs.  01/18/2018, 7:01 AM

## 2018-01-18 NOTE — Progress Notes (Signed)
VASCULAR LAB PRELIMINARY  ARTERIAL  ABI completed:    RIGHT    LEFT    PRESSURE WAVEFORM  PRESSURE WAVEFORM  BRACHIAL 114 Triphasic BRACHIAL Unable to obtain due to bending of the arm ? Monophasic versus sounding biphasic  DP 0 absent DP 0 absent  AT 0 absent AT 0 absent  PT 0 absent PT 0 absent  GREAT TOE 0 absent GREAT TOE 0 absent    RIGHT LEFT  ABI 0/0 0/0   Bilateral ABIs and TBIs could not be obtain due to no audible Doppler signal throughout. There was constant involuntary movement of the leg g which may have adversely affected the results.  Thereasa Iannello, RVS 01/18/2018, 12:55 PM

## 2018-01-18 NOTE — Progress Notes (Signed)
Pt assessed through RT assessment protocol.  Pt scored a 9 which is a severity score of 2, but nebulized albuterol treatments will remain q6h due to severity of Pt wheezing.  This will be reassessed by [facility] RT as Pt condition changes.

## 2018-01-19 ENCOUNTER — Inpatient Hospital Stay (HOSPITAL_COMMUNITY): Payer: Medicare Other

## 2018-01-19 DIAGNOSIS — R7881 Bacteremia: Secondary | ICD-10-CM

## 2018-01-19 DIAGNOSIS — B9562 Methicillin resistant Staphylococcus aureus infection as the cause of diseases classified elsewhere: Secondary | ICD-10-CM

## 2018-01-19 DIAGNOSIS — K6289 Other specified diseases of anus and rectum: Secondary | ICD-10-CM

## 2018-01-19 DIAGNOSIS — I714 Abdominal aortic aneurysm, without rupture, unspecified: Secondary | ICD-10-CM

## 2018-01-19 LAB — BASIC METABOLIC PANEL
Anion gap: 15 (ref 5–15)
BUN: 86 mg/dL — AB (ref 8–23)
CALCIUM: 8.1 mg/dL — AB (ref 8.9–10.3)
CO2: 17 mmol/L — ABNORMAL LOW (ref 22–32)
Chloride: 125 mmol/L — ABNORMAL HIGH (ref 98–111)
Creatinine, Ser: 5.05 mg/dL — ABNORMAL HIGH (ref 0.61–1.24)
GFR calc Af Amer: 11 mL/min — ABNORMAL LOW (ref >60)
GFR, EST NON AFRICAN AMERICAN: 9 mL/min — AB (ref >60)
GLUCOSE: 215 mg/dL — AB (ref 70–99)
Potassium: 4.3 mmol/L (ref 3.5–5.1)
Sodium: 157 mmol/L — ABNORMAL HIGH (ref 135–145)

## 2018-01-19 LAB — GLUCOSE, CAPILLARY
GLUCOSE-CAPILLARY: 157 mg/dL — AB (ref 70–99)
GLUCOSE-CAPILLARY: 121 mg/dL — AB (ref 70–99)
Glucose-Capillary: 157 mg/dL — ABNORMAL HIGH (ref 70–99)
Glucose-Capillary: 96 mg/dL (ref 70–99)

## 2018-01-19 LAB — VANCOMYCIN, RANDOM: Vancomycin Rm: 7

## 2018-01-19 LAB — HEPATITIS PANEL, ACUTE
HCV Ab: 0.1 s/co ratio (ref 0.0–0.9)
HEP A IGM: NEGATIVE
HEP B C IGM: NEGATIVE
Hepatitis B Surface Ag: NEGATIVE

## 2018-01-19 MED ORDER — SODIUM CHLORIDE 0.45 % IV BOLUS
500.0000 mL | Freq: Once | INTRAVENOUS | Status: AC
  Administered 2018-01-19: 500 mL via INTRAVENOUS

## 2018-01-19 MED ORDER — FENTANYL CITRATE (PF) 100 MCG/2ML IJ SOLN
25.0000 ug | INTRAMUSCULAR | Status: DC | PRN
  Administered 2018-01-19 – 2018-01-20 (×4): 50 ug via INTRAVENOUS
  Filled 2018-01-19 (×5): qty 2

## 2018-01-19 MED ORDER — DEXTROSE 5 % IV SOLN
INTRAVENOUS | Status: DC
  Administered 2018-01-19 – 2018-01-20 (×3): via INTRAVENOUS

## 2018-01-19 MED ORDER — GLYCOPYRROLATE 0.2 MG/ML IJ SOLN
0.2000 mg | INTRAMUSCULAR | Status: AC
  Administered 2018-01-19 – 2018-01-22 (×15): 0.2 mg via INTRAVENOUS
  Filled 2018-01-19 (×19): qty 1

## 2018-01-19 MED ORDER — VANCOMYCIN VARIABLE DOSE PER UNSTABLE RENAL FUNCTION (PHARMACIST DOSING): Status: DC

## 2018-01-19 MED ORDER — VANCOMYCIN HCL IN DEXTROSE 1-5 GM/200ML-% IV SOLN
1000.0000 mg | Freq: Once | INTRAVENOUS | Status: AC
  Administered 2018-01-19: 1000 mg via INTRAVENOUS
  Filled 2018-01-19: qty 200

## 2018-01-19 NOTE — Progress Notes (Signed)
Subjective: Pt unresponsive   Antibiotics:  Anti-infectives (From admission, onward)   Start     Dose/Rate Route Frequency Ordered Stop   01/19/18 1000  vancomycin (VANCOCIN) IVPB 1000 mg/200 mL premix     1,000 mg 200 mL/hr over 60 Minutes Intravenous  Once 01/19/18 0745 01/19/18 1033   01/19/18 0740  vancomycin variable dose per unstable renal function (pharmacist dosing)      Does not apply See admin instructions 01/19/18 0740     01/18/18 2200  ceFEPIme (MAXIPIME) 500 mg in dextrose 5 % 50 mL IVPB  Status:  Discontinued     500 mg 100 mL/hr over 30 Minutes Intravenous Every 24 hours 2018/02/13 1727 01/19/18 0653   01/18/18 1000  vancomycin (VANCOCIN) 500 mg in sodium chloride 0.9 % 100 mL IVPB     500 mg 100 mL/hr over 60 Minutes Intravenous  Once 01/18/18 0918 01/18/18 1304   13-Feb-2018 2200  metroNIDAZOLE (FLAGYL) IVPB 500 mg     500 mg 100 mL/hr over 60 Minutes Intravenous Every 8 hours 02-13-2018 1716     Feb 13, 2018 1415  ceFEPIme (MAXIPIME) 2 g in sodium chloride 0.9 % 100 mL IVPB     2 g 200 mL/hr over 30 Minutes Intravenous  Once February 13, 2018 1406 13-Feb-2018 1445   02-13-18 1415  metroNIDAZOLE (FLAGYL) IVPB 500 mg     500 mg 100 mL/hr over 60 Minutes Intravenous  Once 02/13/2018 1406 2018/02/13 1458      Medications: Scheduled Meds: . albuterol  2.5 mg Nebulization Q6H  . chlorhexidine  15 mL Mouth Rinse BID  . Chlorhexidine Gluconate Cloth  6 each Topical Q0600  . heparin  5,000 Units Subcutaneous Q8H  . insulin aspart  0-5 Units Subcutaneous QHS  . insulin aspart  0-9 Units Subcutaneous TID WC  . mouth rinse  15 mL Mouth Rinse q12n4p  . mometasone-formoterol  2 puff Inhalation BID  . mupirocin ointment  1 application Nasal BID  . tiotropium  18 mcg Inhalation Q24H  . vancomycin variable dose per unstable renal function (pharmacist dosing)   Does not apply See admin instructions   Continuous Infusions: . dextrose Stopped (01/19/18 1035)  . famotidine (PEPCID) IV  Stopped (01/19/18 0956)  . metronidazole Stopped (01/19/18 1442)   PRN Meds:.albuterol, fentaNYL (SUBLIMAZE) injection, labetalol, ondansetron **OR** ondansetron (ZOFRAN) IV    Objective: Weight change:  (0 kg)  Intake/Output Summary (Last 24 hours) at 01/19/2018 1544 Last data filed at 01/19/2018 1500 Gross per 24 hour  Intake 2423.91 ml  Output 1150 ml  Net 1273.91 ml   Blood pressure (!) 165/121, pulse 88, temperature (!) 96.3 F (35.7 C), temperature source Axillary, resp. rate (!) 23, height 5\' 8"  (1.727 m), weight 153 lb (69.4 kg), SpO2 100 %. Temp:  [96.3 F (35.7 C)-98.6 F (37 C)] 96.3 F (35.7 C) (08/03 1151) Pulse Rate:  [83-97] 88 (08/03 1500) Resp:  [18-35] 23 (08/03 1500) SpO2:  [97 %-100 %] 100 % (08/03 1500) Arterial Line BP: (98-154)/(32-51) 112/33 (08/03 1500)  Physical Exam: General: obtunded, rhonchorous airway sounds HEENT:[place]/AT [facility] regular rate, normal  No mgr Chest: , rhonchorous airway sounds Abdomen: soft non-distended,  Extremities/Skin:  01/19/18:      Neuro: nonfocal  CBC:    BMET Recent Labs    01/18/18 0253 01/19/18 0445  NA 158* 157*  K 5.0 4.3  CL 121* 125*  CO2 20* 17*  GLUCOSE 128* 215*  BUN 86* 86*  CREATININE  5.55* 5.05*  CALCIUM 8.3* 8.1*     Liver Panel  Recent Labs    09/11/2017 1217 01/18/18 0253  PROT 8.0 6.5  ALBUMIN 2.5* 2.1*  AST 80* 74*  ALT 59* 47*  ALKPHOS 259* 184*  BILITOT 0.7 0.4       Sedimentation Rate No results for input(s): ESRSEDRATE in the last 72 hours. C-Reactive Protein No results for input(s): CRP in the last 72 hours.  Micro Results: Recent Results (from the past 720 hour(s))  Culture, blood (routine x 2)     Status: Abnormal (Preliminary result)   Collection Time: 09/11/2017 11:40 AM  Result Value Ref Range Status   Specimen Description   Final    BLOOD RIGHT ANTECUBITAL Performed at [facility], [address] [place], [place] [address]    Special  Requests   Final    BOTTLES DRAWN AEROBIC AND ANAEROBIC Blood Culture adequate volume Performed at [facility], [address] [place], [place] [address]    Culture  Setup Time   Final    IN BOTH AEROBIC AND ANAEROBIC BOTTLES GRAM POSITIVE COCCI CRITICAL RESULT CALLED TO, READ BACK BY AND VERIFIED WITH: PHARMD J GADHIA 01/18/18 AT 823 AM BY CM    Culture (A)  Final    STAPHYLOCOCCUS AUREUS SUSCEPTIBILITIES TO FOLLOW Performed at [facility], [address] [place], [place] [address]    Report Status PENDING  Incomplete  Culture, blood (routine x 2)     Status: Abnormal (Preliminary result)   Collection Time: 09/11/2017 11:40 AM  Result Value Ref Range Status   Specimen Description   Final    BLOOD BLOOD RIGHT FOREARM Performed at [facility], [address] [place], [place] [address]    Special Requests   Final    BOTTLES DRAWN AEROBIC ONLY Blood Culture adequate volume Performed at [facility], [address] [place], [place] [address]    Culture  Setup Time   Final    GRAM POSITIVE COCCI AEROBIC BOTTLE ONLY CRITICAL VALUE NOTED.  VALUE IS CONSISTENT WITH PREVIOUSLY REPORTED AND CALLED VALUE. Performed at [facility], [address] [place], [place] [address]    Culture STAPHYLOCOCCUS AUREUS (A)  Final   Report Status PENDING  Incomplete  Blood Culture ID Panel (Reflexed)     Status: Abnormal   Collection Time: 09/11/2017 11:40 AM  Result Value Ref Range Status   Enterococcus species NOT DETECTED NOT DETECTED Final   Listeria monocytogenes NOT DETECTED NOT DETECTED Final   Staphylococcus species DETECTED (A) NOT DETECTED Final    Comment: CRITICAL RESULT CALLED TO, READ BACK BY AND VERIFIED WITH: PHARMD J GADHIA 01/18/18 AT 825 AM BYCM    Staphylococcus aureus DETECTED (A) NOT DETECTED Final    Comment: Methicillin (oxacillin)-resistant Staphylococcus aureus (MRSA). MRSA is predictably resistant to  beta-lactam antibiotics (except ceftaroline). Preferred therapy is vancomycin unless clinically contraindicated. Patient requires contact precautions if  hospitalized. CRITICAL RESULT CALLED TO, READ BACK BY AND VERIFIED WITH: PHARMD J GADHIA 01/18/18 AT 825 AM BY CM    Methicillin resistance DETECTED (A) NOT DETECTED Final    Comment: CRITICAL RESULT CALLED TO, READ BACK BY AND VERIFIED WITH: PHARMD J GADHIA 01/18/18 AT 825 BY CM    Streptococcus species NOT DETECTED NOT DETECTED Final   Streptococcus agalactiae NOT DETECTED NOT DETECTED Final   Streptococcus pneumoniae NOT DETECTED NOT DETECTED Final   Streptococcus pyogenes NOT DETECTED NOT  DETECTED Final   Acinetobacter baumannii NOT DETECTED NOT DETECTED Final   Enterobacteriaceae species NOT DETECTED NOT DETECTED Final   Enterobacter cloacae complex NOT DETECTED NOT DETECTED Final   Escherichia coli NOT DETECTED NOT DETECTED Final   Klebsiella oxytoca NOT DETECTED NOT DETECTED Final   Klebsiella pneumoniae NOT DETECTED NOT DETECTED Final   Proteus species NOT DETECTED NOT DETECTED Final   Serratia marcescens NOT DETECTED NOT DETECTED Final   Haemophilus influenzae NOT DETECTED NOT DETECTED Final   Neisseria meningitidis NOT DETECTED NOT DETECTED Final   Pseudomonas aeruginosa NOT DETECTED NOT DETECTED Final   Candida albicans NOT DETECTED NOT DETECTED Final   Candida glabrata NOT DETECTED NOT DETECTED Final   Candida krusei NOT DETECTED NOT DETECTED Final   Candida parapsilosis NOT DETECTED NOT DETECTED Final   Candida tropicalis NOT DETECTED NOT DETECTED Final    Comment: Performed at [facility], [address] [place], [place] [address]  Urine culture     Status: Abnormal   Collection Time: 2018/02/06  3:07 PM  Result Value Ref Range Status   Specimen Description   Final    URINE, RANDOM Performed at [facility], [address], [place], [place] [address]    Special Requests   Final     NONE Performed at [facility], [address] [place], [place] [address]    Culture MULTIPLE SPECIES PRESENT, SUGGEST RECOLLECTION (A)  Final   Report Status 01/18/2018 FINAL  Final  MRSA PCR Screening     Status: Abnormal   Collection Time: 06-Feb-2018  6:45 PM  Result Value Ref Range Status   MRSA by PCR POSITIVE (A) NEGATIVE Final    Comment:        The GeneXpert MRSA Assay (FDA approved for NASAL specimens only), is one component of a comprehensive MRSA colonization surveillance program. It is not intended to diagnose MRSA infection nor to guide or monitor treatment for MRSA infections. RESULT CALLED TO, READ BACK BY AND VERIFIED WITH: MCMILLIAN,S AT 2005 ON 02-06-18 BY MOSLEY,J Performed at [facility], [address] [place], [place] [address]   Culture, blood (Routine X 2) w Reflex to ID Panel     Status: None (Preliminary result)   Collection Time: 01/18/18  9:48 AM  Result Value Ref Range Status   Specimen Description   Final    BLOOD LEFT HAND Performed at [facility], [address], [place], [place] [address]    Special Requests   Final    BOTTLES DRAWN AEROBIC ONLY Blood Culture results may not be optimal due to an inadequate volume of blood received in culture bottles Performed at [facility], [address] [place], [place] [address]    Culture  Setup Time   Final    AEROBIC BOTTLE ONLY GRAM POSITIVE COCCI CRITICAL VALUE NOTED.  VALUE IS CONSISTENT WITH PREVIOUSLY REPORTED AND CALLED VALUE.    Culture   Final    NO GROWTH 1 DAY Performed at [facility], [address] [place], [place] [address]    Report Status PENDING  Incomplete  Culture, blood (Routine X 2) w Reflex to ID Panel     Status: None (Preliminary result)   Collection Time: 01/18/18  9:58 AM  Result Value Ref Range Status   Specimen Description   Final    BLOOD LEFT ARM Performed at [facility], [address] [place], [place]  [address]    Special Requests   Final    BOTTLES DRAWN AEROBIC ONLY Blood Culture results may not be optimal due to an inadequate volume of blood received in culture bottles Performed at [facility], [address], [place], [place] [address]    Culture  Setup Time   Final    AEROBIC BOTTLE ONLY GRAM POSITIVE COCCI CRITICAL VALUE NOTED.  VALUE IS CONSISTENT WITH PREVIOUSLY REPORTED AND CALLED VALUE.    Culture   Final    NO GROWTH 1 DAY Performed at [facility], [address] [place], [place] [address]    Report Status PENDING  Incomplete    Studies/Results: Ct Head Wo Contrast  Result Date: 02/03/2018 CLINICAL DATA:  Unresponsive, encephalopathy EXAM: CT HEAD WITHOUT CONTRAST TECHNIQUE: Contiguous axial images were obtained from the base of the skull through the vertex without intravenous contrast. COMPARISON:  None. FINDINGS: Brain: No evidence of acute infarction, hemorrhage, extra-axial collection or mass lesion/mass effect. Subcortical white matter and periventricular small vessel ischemic changes. Cortical and central atrophy.  No ventriculomegaly. Vascular: Mild intracranial atherosclerosis. Skull: Normal. Negative for fracture or focal lesion. Sinuses/Orbits: The visualized paranasal sinuses are essentially clear. The mastoid air cells are unopacified. Other: None. IMPRESSION: No evidence of acute intracranial abnormality. Atrophy with small vessel ischemic changes. Electronically Signed   By: Charline Bills M.D.   On: 02/04/2018 18:25   Dg Chest Port 1 View  Result Date: 01/19/2018 CLINICAL DATA:  Shortness of breath. EXAM: PORTABLE CHEST 1 VIEW COMPARISON:  01/18/2018 FINDINGS: The patient is mildly rotated to the right. The cardiomediastinal silhouette is unchanged mild right basilar opacity is unchanged. The left lung remains clear. No sizable pleural effusion or pneumothorax is identified. IMPRESSION:  Unchanged right basilar opacity which could reflect pneumonia or atelectasis. Electronically Signed   By: Sebastian Ache M.D.   On: 01/19/2018 07:19   Dg Chest Port 1 View  Result Date: 01/18/2018 CLINICAL DATA:  Sepsis and shortness of breath. EXAM: PORTABLE CHEST 1 VIEW COMPARISON:  01/22/2018 FINDINGS: The heart size is stable and normal. Stable tortuosity of the thoracic aorta. Mild new opacity at the right lung base represents either atelectasis or developing infiltrate. There is no evidence of pulmonary edema, pneumothorax, nodule or pleural fluid. Both lungs are clear. The visualized skeletal structures are unremarkable. IMPRESSION: Mild new opacity at the right lung base representing either atelectasis or developing pneumonia. Electronically Signed   By: Irish Lack M.D.   On: 01/18/2018 08:34      Assessment/Plan:  INTERVAL HISTORY: pts repeat blood cultures prior to vancomycin being started are w [facility]   Active Problems:   Essential hypertension   Chronic combined systolic and diastolic CHF (congestive heart failure) (HCC)   CKD (chronic kidney disease), stage IV (HCC)   Stage I pressure ulcer   Type 2 diabetes mellitus with other specified complication (HCC)   Sepsis (HCC)   Proctitis   Hypernatremia   Acute kidney injury superimposed on CKD (HCC)   Hyperkalemia   Transaminitis   Toxic encephalopathy   Gout    Greg Jones is a 84 y.o. male with  Severe COPD, CKD3 with chronic rash admitted with obtundation and septis due to MRSA bacteremia. He was also being treated for proctitis based on imaging  #1 MRSA bacteremia:  His prognosis appears grim  For completeness sake SAB checklist follows       [facility] Staphylococcus aureus bacteremia  Staphylococcus aureus bacteremia (SAB) is associated with a high rate of complications and mortality.  Specific aspects of clinical management are critical to optimizing the outcome of  patients with SAB.  Therefore, the [facility]imicrobial Management Team [facility]) has initiated an intervention aimed at improving the management of SAB at [facility].  To do so, Infectious Diseases physicians are providing an evidence-based consult for the management of all patients with SAB.     Yes No Comments  Perform follow-up blood cultures (even if the patient is afebrile) to ensure clearance of bacteremia [x]  []  Repeat today again  Remove vascular catheter and obtain follow-up blood cultures after the removal of the catheter [x]  []    Perform echocardiography to evaluate for endocarditis (transthoracic ECHO is 40-50% sensitive, TEE is > 90% sensitive) [x]  []  Please keep in mind, that neither test can definitively EXCLUDE endocarditis, and that should clinical suspicion remain high for endocarditis the patient should then still be treated with an "endocarditis" duration of therapy = 6 weeks  TEE too dangerous  Consult electrophysiologist to evaluate implanted cardiac device (pacemaker, ICD) []  []    Ensure source control []  []  Have all abscesses been drained effectively? Have deep seeded infections (septic joints or osteomyelitis) had appropriate surgical debridement? Skin possibly  Investigate for "metastatic" sites of infection []  []  Does the patient have ANY symptom or physical exam finding that would suggest a deeper infection (back or neck pain that may be suggestive of vertebral osteomyelitis or epidural abscess, muscle pain that could be a symptom of pyomyositis)?  Keep in mind that for deep seeded infections MRI imaging with contrast is preferred rather than other often insensitive tests such as plain x-rays, especially early in a patient's presentation.  Change antibiotic therapy to vancomycin [x]  []  Beta-lactam antibiotics are preferred for MSSA due to higher cure rates.   If on Vancomycin, goal trough should be 15 - 20 mcg/mL  Estimated duration of IV antibiotic therapy:  6 weeks  if he survives []  []  Consult case management for probably prolonged outpatient IV antibiotic therapy   #2 Proctitis: does not seem likely. DC non MRSA abx  #3 Acute on CKD: would not advocate for HD  #4 Goals of care: I would STRONGLY encourage to push towards pure palliativee care.   LOS: 2 days   Acey Lav 01/19/2018, 3:44 PM

## 2018-01-19 NOTE — Progress Notes (Signed)
Palliative care progress note  Reason for consult: Goals of care in light of MRSA bacteremia  I met today with his sister, Greg Jones.  I introduced palliative care as specialized medical care for people living with serious illness. It focuses on providing relief from the symptoms and stress of a serious illness. The goal is to improve quality of life for both the patient and the family.  She reports that the most important things to her brother have always been his family.  He was married twice and has multiple children (though talking with her it seems that they are all adopted or stepchildren rather than any biological children).  Had 7 brothers and sisters and 3 of them are still living.  Is originally from the [place] area but moved to [place] to be closer to his sister.  She is his legal guardian and reports family looks to her to make decisions independently.  We discussed clinical course as well as wishes moving forward in regard to care this hospitalization discussed.  Discussed his multiple medical problems and poor clinical status.   I shared with her my concern that her brother is transitioning to actively dying and I am concerned that he is likely to die regardless of interventions moving forward.  We discussed difference between a aggressive medical intervention path and a palliative, comfort focused care path.     His sister reports that she does not want him to suffer but is overwhelmed by everything going on.  Discussed plan to increase his pain medication as he still appears to be restless.  She reports needing time to think but being appreciative of our conversation.  Questions and concerns addressed.   PMT will continue to support.  - His sister/guardian reports needing time to discuss with family.  She has my card and will call me or notify bedside RN once family has made a decision.  Continue current care for now.  Increased pain medication to 25-21mg fentanyl as  needed.  Total time: 55 minutes Greater than 50%  of this time was spent counseling and coordinating care related to the above assessment and plan.  GMicheline Rough MD [facility] [phone]

## 2018-01-19 NOTE — Progress Notes (Signed)
SLP Cancellation Note  Patient Details Name: Greg RobertsJonathan Lackman MRN: [id number] DOB: 10/30/1927   Cancelled treatment:       Reason Eval/Treat Not Completed: Fatigue/lethargy limiting ability to participate;Medical issues which prohibited therapy;Other (comment). Family meeting pending. Will follow up next date.  Rondel BatonMary Beth Gennifer Potenza, [place], CCC-SLP Speech-Language Pathologist [phone]    Arlana LindauMary E Kaisley Stiverson 01/19/2018, 2:07 PM

## 2018-01-19 NOTE — Progress Notes (Signed)
Pharmacy Antibiotic Note  Greg RobertsJonathan Jones is a 84 y.o. male admitted on 01/18/2018 with proctitis and MRSA bacteremiaPharmacy has been consulted for Vancomycin dosing.  Today, 01/19/18  Random vanc level 7 this AM at 0455, dose of 500mg  given 8/2 at 1204  SCr 5.05 improved from 5.55.   Afebrile  Plan: Day 2 Vancomycin 1) Will redose vanomycin at 1g today, recheck vanc random tomorrow AM 2) Daily SCr   Height: 5\' 8"  (172.7 cm) Weight: 153 lb (69.4 kg) IBW/kg (Calculated) : 68.4  Temp (24hrs), Avg:98 F (36.7 C), Min:97.3 F (36.3 C), Max:98.6 F (37 C)  Recent Labs  Lab 01/14/18 01/30/2018 1217 02/13/2018 1220 02/09/2018 1413 01/20/2018 1734 01/31/2018 2039 01/18/18 0253 01/19/18 0445  WBC  --  14.9*  --   --   --   --  9.5  --   CREATININE 3.9* 6.02*  --   --  5.71*  --  5.55* 5.05*  LATICACIDVEN  --   --  6.93* 5.43* 3.3* 3.8*  --   --   VANCORANDOM  --   --   --   --   --   --   --  7    Estimated Creatinine Clearance: 9.8 mL/min (A) (by C-G formula based on SCr of 5.05 mg/dL (H)).    Allergies  Allergen Reactions  . Other Other (See Comments)    Oxygen Sensitive  . Penicillins Rash and Other (See Comments)    Any -illins as well as Mycins Has patient had a PCN reaction causing immediate rash, facial/tongue/throat swelling, SOB or lightheadedness with hypotension: Yes Has patient had a PCN reaction causing severe rash involving mucus membranes or skin necrosis: Yes Has patient had a PCN reaction that required hospitalization No Has patient had a PCN reaction occurring within the last 10 years: No If all of the above answers are "NO", then may proceed with Cephalosporin use.   . Vitamin B12 Itching, Rash and Other (See Comments)    injections    Thank you for allowing pharmacy to be a part of this patient's care.  Berkley HarveyLegge, Greg Jones 01/19/2018 7:41 AM

## 2018-01-19 NOTE — Progress Notes (Signed)
[facility] PROGRESS NOTE    Progress Note  Talan Gildner  WGN:[id number] DOB: 04-21-1928 DOA: 02/09/2018 PCP: Margit Hanks, MD     Brief Narrative:   Abir Craine is an 84 y.o. male past medical history of chronic kidney disease, COPD hypertension presented with sepsis and abdominal pain in the ED a CT scan was done that showed evidence of proctitis  Assessment/Plan:   Sepsis due MRSA pneumonia possibly proctitis Started empirically on sepsis pathway on IV antibiotics and IV fluids. PCR ID reflex panel show MRSA. ID was consulted recommended to discontinue IV cefepime and continue IV vancomycin, will also continue IV Flagyl. We will repeat blood cultures on 01/19/2018. He has remained afebrile.  Acute kidney injury on chronic kidney disease stage III: Likely prerenal in the setting of sepsis and ongoing Lasix use. Baseline creatinine around 2. Improving with IV fluids.  Creatinine has improved to 5.0, he is about 4 L positive.  Hypovolemic hypernatremia: Improving slowly but half-normal saline with D5 causing some electrolyte derangements. We will change him to D5W aggressively recheck a basic metabolic panel in the morning.  Transaminitis: The setting of dehydration and sepsis, improving  Acute toxic encephalopathy: Likely due to sepsis and probably hypernatremia As per family member he is a DNR/DNI. But his condition has been deteriorating over the last month. Hospice and palliative care to meet with family today.  Rash: Diffuse desquamative rash seems to be like a drug reaction.  Pressure ulcer bilateral of the heels: Wound care has been consulted to try to relieve pressure.  Diabetes mellitus type 2: Continue sliding scale insulin. Last A1c was 8 on 12/2017.  Chronic diastolic and systolic heart failure: Continue strict I's and O's he appears to be dry, making urine but poor urine output recorded. He is contracted cannot evaluate his fluid status  will place a PICC line check CVP every 12's. Abdominal aortic aneurysm: We will need to follow-up with vascular as an outpatient.  Essential hypertension Seems to be slightly high we will try to keep it on the high side due to his new acute renal failure, will use labetalol for systolic blood pressure greater than 190.  Stage I pressure ulcer of heels: WOC      DVT prophylaxis: lovenox Family Communication:Sister Disposition Plan/Barrier to D/C: unable to determine Code Status:     Code Status Orders  (From admission, onward)        Start     Ordered   01/22/2018 1717  Full code  Continuous     02/16/2018 1716    Code Status History    Date Active Date Inactive Code Status Order ID Comments User Context   11/12/2017 1510 11/17/2017 1842 Full Code [id number]  Alberteen Sam, MD ED   10/18/2017 2315 10/25/2017 2105 Full Code [id number]  Jonah Blue, MD Inpatient   08/14/2017 2209 08/22/2017 1627 Full Code [id number]  Clydie Braun, MD ED   09/07/2016 1820 09/11/2016 1721 Full Code [id number]  Orpah Cobb, MD Inpatient   04/11/2016 1850 04/12/2016 1950 Full Code [id number]  Albertine Grates, MD Inpatient   03/15/2016 1839 03/18/2016 1539 Full Code [id number]  Orpah Cobb, MD Inpatient   09/07/2015 1554 09/14/2015 1712 Full Code [id number]  Parrett, Virgel Bouquet, NP Inpatient        IV Access:    Peripheral IV   Procedures and diagnostic studies:   Ct Abdomen Pelvis Wo Contrast  Result Date: 02/12/2018 CLINICAL DATA:  Abdominal pain. Clinical concern for gastroenteritis or  colitis. EXAM: CT ABDOMEN AND PELVIS WITHOUT CONTRAST TECHNIQUE: Multidetector CT imaging of the abdomen and pelvis was performed following the standard protocol without IV contrast. COMPARISON:  Abdomen and pelvis radiographs dated 11/17/2017 and abdomen and pelvis CT dated 02/07/2016. Abdomen ultrasound dated 08/19/2017. FINDINGS: Lower chest: Interval dependent atelectasis in the right lower lobe. Hepatobiliary: Mild  sludge in the gallbladder. Normal appearing liver. Pancreas: Diffuse atrophy with partial fatty replacement. Spleen: Normal in size without focal abnormality. Adrenals/Urinary Tract: Adrenal glands are unremarkable. Kidneys are normal, without renal calculi, focal lesion, or hydronephrosis. Bladder is unremarkable. Stomach/Bowel: Multiple right colon diverticula without evidence of diverticulitis. Interval mild diffuse rectal wall thickening. Normal appearing appendix, stomach and small bowel. Vascular/Lymphatic: Atheromatous arterial calcifications. And wrist small dilatation of the proximal infrarenal abdominal aorta with a more prominent focal out pouching posterolaterally on the left. The maximum total diameter, including the focal outpouching, is 4.2 cm, previously 3.6 cm. The focal outpouching is rounded and measures 1.5 cm in maximum diameter on coronal image number 57 and 1.6 cm in maximum diameter on axial image number 29 series 2. Also demonstrated is mild aneurysmal dilatation of the left common iliac artery with a maximum AP diameter 1.8 cm on image number 52 series 2. No enlarged lymph nodes. Reproductive: Mildly and enlarged prostate gland containing coarse calcifications. Other: Small bilateral inguinal hernias containing fat. Musculoskeletal: Bilateral hip degenerative changes. Lumbar and lower thoracic spine degenerative changes. IMPRESSION: 1. 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 The maximum combined diameter is 4.2 cm, increased from 3.6 cm. Recommend vascular surgery consultation due to the increased size of the more focal rounded outpouching, currently measuring 1.6 cm. 2. 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 3. Interval mild diffuse rectal wall thickening, suggesting mild proctitis. Right colon diverticulosis without evidence of  diverticulitis. 4. Mild sludge in the gallbladder. 5. Diffuse pancreatic atrophy. 6. Right lower lobe dependent atelectasis. Electronically Signed   By: Beckie Salts M.D.   On: 01/27/2018 13:45   Dg Chest 2 View  Result Date: 02/16/2018 CLINICAL DATA:  Mental status change EXAM: CHEST - 2 VIEW COMPARISON:  11/17/2017 FINDINGS: The heart size and mediastinal contours are within normal limits. Both lungs are clear. The visualized skeletal structures are unremarkable. IMPRESSION: No active cardiopulmonary disease. Electronically Signed   By: Marlan Palau M.D.   On: 02/07/2018 12:59   Ct Head Wo Contrast  Result Date: 02/13/2018 CLINICAL DATA:  Unresponsive, encephalopathy EXAM: CT HEAD WITHOUT CONTRAST TECHNIQUE: Contiguous axial images were obtained from the base of the skull through the vertex without intravenous contrast. COMPARISON:  None. FINDINGS: Brain: No evidence of acute infarction, hemorrhage, extra-axial collection or mass lesion/mass effect. Subcortical white matter and periventricular small vessel ischemic changes. Cortical and central atrophy.  No ventriculomegaly. Vascular: Mild intracranial atherosclerosis. Skull: Normal. Negative for fracture or focal lesion. Sinuses/Orbits: The visualized paranasal sinuses are essentially clear. The mastoid air cells are unopacified. Other: None. IMPRESSION: No evidence of acute intracranial abnormality. Atrophy with small vessel ischemic changes. Electronically Signed   By: Charline Bills M.D.   On: 01/28/2018 18:25   Dg Chest Port 1 View  Result Date: 01/19/2018 CLINICAL DATA:  Shortness of breath. EXAM: PORTABLE CHEST 1 VIEW COMPARISON:  01/18/2018 FINDINGS: The patient is mildly rotated to the right. The cardiomediastinal silhouette is unchanged mild right basilar opacity is unchanged. The  left lung remains clear. No sizable pleural effusion or pneumothorax is identified. IMPRESSION: Unchanged right basilar opacity which could reflect pneumonia or  atelectasis. Electronically Signed   By: Sebastian Ache M.D.   On: 01/19/2018 07:19   Dg Chest Port 1 View  Result Date: 01/18/2018 CLINICAL DATA:  Sepsis and shortness of breath. EXAM: PORTABLE CHEST 1 VIEW COMPARISON:  01/18/2018 FINDINGS: The heart size is stable and normal. Stable tortuosity of the thoracic aorta. Mild new opacity at the right lung base represents either atelectasis or developing infiltrate. There is no evidence of pulmonary edema, pneumothorax, nodule or pleural fluid. Both lungs are clear. The visualized skeletal structures are unremarkable. IMPRESSION: Mild new opacity at the right lung base representing either atelectasis or developing pneumonia. Electronically Signed   By: Irish Lack M.D.   On: 01/18/2018 08:34     Medical Consultants:    None.  Anti-Infectives:   IV cefepime and flagyl  Subjective:    Greg Jones patient continues to be nonverbal moaning and groaning.  Objective:    Vitals:   01/19/18 0300 01/19/18 0352 01/19/18 0400 01/19/18 0500  BP:      Pulse: 88  88 88  Resp: (!) 24  (!) 27 (!) 29  Temp:  97.9 F (36.6 C)    TempSrc:  Axillary    SpO2: 100%  100% 100%  Weight:      Height:        Intake/Output Summary (Last 24 hours) at 01/19/2018 0726 Last data filed at 01/19/2018 0513 Gross per 24 hour  Intake 2876.54 ml  Output 1025 ml  Net 1851.54 ml   Filed Weights   01/18/18 0500 01/18/18 1019  Weight: 69.4 kg (153 lb) 69.4 kg (153 lb)    Exam: General exam: In no acute distress. Respiratory system: Good air movement and clear to auscultation. Cardiovascular system: S1 & S2 heard, RRR. No JVD. Gastrointestinal system: Abdomen is nondistended, soft and tenderness. Central nervous system: Not alert or oriented.. No focal neurological deficits. Extremities: No pedal edema. Skin: scaling desquamative.    Data Reviewed:    Labs: Basic Metabolic Panel: Recent Labs  Lab 01/14/18 02/04/2018 1217 02/13/2018 1734  01/18/18 0253 01/19/18 0445  NA 150* 160* 158* 158* 157*  K 3.9 6.0* 5.5* 5.0 4.3  CL  --  114* 120* 121* 125*  CO2  --  23 22 20* 17*  GLUCOSE  --  231* 192* 128* 215*  BUN 50* 82* 80* 86* 86*  CREATININE 3.9* 6.02* 5.71* 5.55* 5.05*  CALCIUM  --  9.5 8.4* 8.3* 8.1*  MG  --   --   --  2.4  --    GFR Estimated Creatinine Clearance: 9.8 mL/min (A) (by C-G formula based on SCr of 5.05 mg/dL (H)). Liver Function Tests: Recent Labs  Lab 01/25/2018 1217 01/18/18 0253  AST 80* 74*  ALT 59* 47*  ALKPHOS 259* 184*  BILITOT 0.7 0.4  PROT 8.0 6.5  ALBUMIN 2.5* 2.1*   Recent Labs  Lab 01/19/2018 1217  LIPASE 21   No results for input(s): AMMONIA in the last 168 hours. Coagulation profile No results for input(s): INR, PROTIME in the last 168 hours.  CBC: Recent Labs  Lab 01/26/2018 1217 01/18/18 0253  WBC 14.9* 9.5  NEUTROABS 12.9*  --   HGB 12.8* 10.4*  HCT 41.2 33.1*  MCV 83.1 80.9  PLT 387 288   Cardiac Enzymes: No results for input(s): CKTOTAL, CKMB, CKMBINDEX, TROPONINI in the last  168 hours. BNP (last 3 results) No results for input(s): PROBNP in the last 8760 hours. CBG: Recent Labs  Lab January 30, 2018 2134 01/18/18 0831 01/18/18 1312 01/18/18 1629 01/18/18 11-06-2152  GLUCAP 162* 112* 120* 78 147*   D-Dimer: No results for input(s): DDIMER in the last 72 hours. Hgb A1c: No results for input(s): HGBA1C in the last 72 hours. Lipid Profile: No results for input(s): CHOL, HDL, LDLCALC, TRIG, CHOLHDL, LDLDIRECT in the last 72 hours. Thyroid function studies: Recent Labs    2018/01/30 11/06/37  TSH 2.720   Anemia work up: No results for input(s): VITAMINB12, FOLATE, FERRITIN, TIBC, IRON, RETICCTPCT in the last 72 hours. Sepsis Labs: Recent Labs  Lab Jan 30, 2018 1217 Jan 30, 2018 1220 Jan 30, 2018 1413 01/30/2018 1734 2018/01/30 2037-11-06 01/18/18 0253  WBC 14.9*  --   --   --   --  9.5  LATICACIDVEN  --  6.93* 5.43* 3.3* 3.8*  --    Microbiology Recent Results (from the past 240  hour(s))  Culture, blood (routine x 2)     Status: None (Preliminary result)   Collection Time: 30-Jan-2018 11:40 AM  Result Value Ref Range Status   Specimen Description   Final    BLOOD RIGHT ANTECUBITAL Performed at [facility], [address] [place], [place] [address]    Special Requests   Final    BOTTLES DRAWN AEROBIC AND ANAEROBIC Blood Culture adequate volume Performed at [facility], [address], [place], [place] [address]    Culture  Setup Time   Final    IN BOTH AEROBIC AND ANAEROBIC BOTTLES GRAM POSITIVE COCCI CRITICAL RESULT CALLED TO, READ BACK BY AND VERIFIED WITH: PHARMD J GADHIA 01/18/18 AT 823 AM BY CM Performed at [facility], [address], [place], [place] [address]    Culture GRAM POSITIVE COCCI  Final   Report Status PENDING  Incomplete  Culture, blood (routine x 2)     Status: None (Preliminary result)   Collection Time: January 30, 2018 11:40 AM  Result Value Ref Range Status   Specimen Description   Final    BLOOD BLOOD RIGHT FOREARM Performed at [facility] [address] [place], [place] [address]    Special Requests   Final    BOTTLES DRAWN AEROBIC ONLY Blood Culture adequate volume Performed at [facility], [address] [place], [place] [address]    Culture  Setup Time   Final    GRAM POSITIVE COCCI AEROBIC BOTTLE ONLY CRITICAL VALUE NOTED.  VALUE IS CONSISTENT WITH PREVIOUSLY REPORTED AND CALLED VALUE. Performed at [facility], [address], [place], [place] [address]    Culture GRAM POSITIVE COCCI  Final   Report Status PENDING  Incomplete  Blood Culture ID Panel (Reflexed)     Status: Abnormal   Collection Time: 01-30-18 11:40 AM  Result Value Ref Range Status   Enterococcus species NOT DETECTED NOT DETECTED Final   Listeria monocytogenes NOT DETECTED NOT DETECTED Final   Staphylococcus species DETECTED (A) NOT DETECTED Final    Comment: CRITICAL  RESULT CALLED TO, READ BACK BY AND VERIFIED WITH: PHARMD J GADHIA 01/18/18 AT 825 AM BYCM    Staphylococcus aureus DETECTED (A) NOT DETECTED Final    Comment: Methicillin (oxacillin)-resistant Staphylococcus aureus (MRSA). MRSA is predictably resistant to beta-lactam antibiotics (except ceftaroline). Preferred therapy is vancomycin unless clinically contraindicated. Patient requires contact precautions if  hospitalized. CRITICAL RESULT CALLED TO, READ BACK BY AND VERIFIED WITH:  PHARMD J GADHIA 01/18/18 AT 825 AM BY CM    Methicillin resistance DETECTED (A) NOT DETECTED Final    Comment: CRITICAL RESULT CALLED TO, READ BACK BY AND VERIFIED WITH: PHARMD J GADHIA 01/18/18 AT 825 BY CM    Streptococcus species NOT DETECTED NOT DETECTED Final   Streptococcus agalactiae NOT DETECTED NOT DETECTED Final   Streptococcus pneumoniae NOT DETECTED NOT DETECTED Final   Streptococcus pyogenes NOT DETECTED NOT DETECTED Final   Acinetobacter baumannii NOT DETECTED NOT DETECTED Final   Enterobacteriaceae species NOT DETECTED NOT DETECTED Final   Enterobacter cloacae complex NOT DETECTED NOT DETECTED Final   Escherichia coli NOT DETECTED NOT DETECTED Final   Klebsiella oxytoca NOT DETECTED NOT DETECTED Final   Klebsiella pneumoniae NOT DETECTED NOT DETECTED Final   Proteus species NOT DETECTED NOT DETECTED Final   Serratia marcescens NOT DETECTED NOT DETECTED Final   Haemophilus influenzae NOT DETECTED NOT DETECTED Final   Neisseria meningitidis NOT DETECTED NOT DETECTED Final   Pseudomonas aeruginosa NOT DETECTED NOT DETECTED Final   Candida albicans NOT DETECTED NOT DETECTED Final   Candida glabrata NOT DETECTED NOT DETECTED Final   Candida krusei NOT DETECTED NOT DETECTED Final   Candida parapsilosis NOT DETECTED NOT DETECTED Final   Candida tropicalis NOT DETECTED NOT DETECTED Final    Comment: Performed at [facility], [address] [place], [place] [address]  Urine culture     Status:  Abnormal   Collection Time: 01-24-2018  3:07 PM  Result Value Ref Range Status   Specimen Description   Final    URINE, RANDOM Performed at [facility], [address] [place], [place] [address]    Special Requests   Final    NONE Performed at [facility], [address] [place], [place] [address]    Culture MULTIPLE SPECIES PRESENT, SUGGEST RECOLLECTION (A)  Final   Report Status 01/18/2018 FINAL  Final  MRSA PCR Screening     Status: Abnormal   Collection Time: 01-24-2018  6:45 PM  Result Value Ref Range Status   MRSA by PCR POSITIVE (A) NEGATIVE Final    Comment:        The GeneXpert MRSA Assay (FDA approved for NASAL specimens only), is one component of a comprehensive MRSA colonization surveillance program. It is not intended to diagnose MRSA infection nor to guide or monitor treatment for MRSA infections. RESULT CALLED TO, READ BACK BY AND VERIFIED WITH: MCMILLIAN,S AT 2005 ON 02/02/2018 BY MOSLEY,J Performed at [facility], [address] [place], [place] [address]   Culture, blood (Routine X 2) w Reflex to ID Panel     Status: None (Preliminary result)   Collection Time: 01/18/18  9:58 AM  Result Value Ref Range Status   Specimen Description   Final    BLOOD LEFT ARM Performed at [facility], [address] [place], [place] [address]    Special Requests   Final    BOTTLES DRAWN AEROBIC ONLY Blood Culture results may not be optimal due to an inadequate volume of blood received in culture bottles Performed at [facility], [address] [place], [place] [address]    Culture  Setup Time   Final    AEROBIC BOTTLE ONLY GRAM POSITIVE COCCI CRITICAL VALUE NOTED.  VALUE IS CONSISTENT WITH PREVIOUSLY REPORTED AND CALLED VALUE. Performed at [facility], [address] [place], [place] [address]    Culture PENDING  Incomplete   Report Status PENDING  Incomplete      Medications:   . albuterol  2.5 mg Nebulization Q6H  . chlorhexidine  15 mL Mouth Rinse BID  . Chlorhexidine Gluconate Cloth  6 each Topical Q0600  . heparin  5,000 Units Subcutaneous Q8H  . insulin aspart  0-5 Units Subcutaneous QHS  . insulin aspart  0-9 Units Subcutaneous TID WC  . mouth rinse  15 mL Mouth Rinse q12n4p  . mometasone-formoterol  2 puff Inhalation BID  . mupirocin ointment  1 application Nasal BID  . tiotropium  18 mcg Inhalation Q24H   Continuous Infusions: . dextrose    . famotidine (PEPCID) IV Stopped (01/18/18 2215)  . metronidazole Stopped (01/19/18 1914)      LOS: 2 days   Marinda Elk  [facility] Pager [phone]  *Please refer to [email or web address], password [email or web address] to get updated schedule on who will round on this patient, as hospitalists switch teams weekly. If 7PM-7AM, please contact night-coverage at [email or web address], password [email or web address] for any overnight needs.  01/19/2018, 7:26 AM

## 2018-01-20 ENCOUNTER — Inpatient Hospital Stay (HOSPITAL_COMMUNITY): Payer: Medicare Other

## 2018-01-20 DIAGNOSIS — G9341 Metabolic encephalopathy: Secondary | ICD-10-CM

## 2018-01-20 DIAGNOSIS — N184 Chronic kidney disease, stage 4 (severe): Secondary | ICD-10-CM

## 2018-01-20 DIAGNOSIS — N179 Acute kidney failure, unspecified: Secondary | ICD-10-CM | POA: Insufficient documentation

## 2018-01-20 LAB — BASIC METABOLIC PANEL
ANION GAP: 14 (ref 5–15)
BUN: 77 mg/dL — AB (ref 8–23)
CHLORIDE: 119 mmol/L — AB (ref 98–111)
CO2: 20 mmol/L — AB (ref 22–32)
Calcium: 8.3 mg/dL — ABNORMAL LOW (ref 8.9–10.3)
Creatinine, Ser: 4.53 mg/dL — ABNORMAL HIGH (ref 0.61–1.24)
GFR calc Af Amer: 12 mL/min — ABNORMAL LOW (ref >60)
GFR calc non Af Amer: 10 mL/min — ABNORMAL LOW (ref >60)
GLUCOSE: 150 mg/dL — AB (ref 70–99)
POTASSIUM: 3.8 mmol/L (ref 3.5–5.1)
Sodium: 153 mmol/L — ABNORMAL HIGH (ref 135–145)

## 2018-01-20 LAB — CULTURE, BLOOD (ROUTINE X 2)
SPECIAL REQUESTS: ADEQUATE
Special Requests: ADEQUATE

## 2018-01-20 LAB — GLUCOSE, CAPILLARY
GLUCOSE-CAPILLARY: 96 mg/dL (ref 70–99)
GLUCOSE-CAPILLARY: 110 mg/dL — AB (ref 70–99)
GLUCOSE-CAPILLARY: 128 mg/dL — AB (ref 70–99)
Glucose-Capillary: 125 mg/dL — ABNORMAL HIGH (ref 70–99)

## 2018-01-20 LAB — VANCOMYCIN, RANDOM: Vancomycin Rm: 19

## 2018-01-20 MED ORDER — VANCOMYCIN HCL 500 MG IV SOLR
500.0000 mg | INTRAVENOUS | Status: DC
  Administered 2018-01-20 – 2018-01-22 (×2): 500 mg via INTRAVENOUS
  Filled 2018-01-20 (×2): qty 500

## 2018-01-20 MED ORDER — FENTANYL CITRATE (PF) 100 MCG/2ML IJ SOLN
50.0000 ug | INTRAMUSCULAR | Status: DC | PRN
  Administered 2018-01-20 – 2018-01-23 (×15): 100 ug via INTRAVENOUS
  Filled 2018-01-20 (×16): qty 2

## 2018-01-20 NOTE — Progress Notes (Signed)
Daily Progress Note   Patient Name: Greg Jones       Date: 01/20/2018 DOB: 02/12/1928  Age: 84 y.o. MRN#: [id number] Attending Physician: Charlynne Cousins, MD Primary Care Physician: Hennie Duos, MD Admit Date: 02/02/2018  Reason for Consultation/Follow-up: Establishing goals of care  Subjective: I met today with patient's sister and niece.    Discussed again regarding his clinical course and plan moving forward in regard to care this hospitalization.   See below:  Length of Stay: 3  Current Medications: Scheduled Meds:  . albuterol  2.5 mg Nebulization Q6H  . chlorhexidine  15 mL Mouth Rinse BID  . Chlorhexidine Gluconate Cloth  6 each Topical Q0600  . glycopyrrolate  0.2 mg Intravenous Q4H  . heparin  5,000 Units Subcutaneous Q8H  . insulin aspart  0-5 Units Subcutaneous QHS  . insulin aspart  0-9 Units Subcutaneous TID WC  . mouth rinse  15 mL Mouth Rinse q12n4p  . mometasone-formoterol  2 puff Inhalation BID  . mupirocin ointment  1 application Nasal BID  . tiotropium  18 mcg Inhalation Q24H    Continuous Infusions: . dextrose 50 mL/hr (01/20/18 0737)  . famotidine (PEPCID) IV Stopped (01/20/18 1042)  . metronidazole 500 mg (01/20/18 1429)  . vancomycin      PRN Meds: albuterol, fentaNYL (SUBLIMAZE) injection, labetalol, ondansetron **OR** ondansetron (ZOFRAN) IV  Physical Exam         General: Intermittent moaning.  HEENT: No bruits, no goiter, no JVD Heart: Regular rate and rhythm. No murmur appreciated. Lungs: Good air movement, clear Abdomen: Soft, mild tender, distended, positive bowel sounds.  Ext: No significant edema Skin: Warm and dry. Exfoliating skin rash. Neuro: Grossly intact, nonfocal.  Vital Signs: BP 97/73   Pulse 94   Temp (!)  97.5 F (36.4 C) (Axillary)   Resp (!) 21   Ht 5' 8"  (1.727 m)   Wt 70 kg (154 lb 5.2 oz)   SpO2 100%   BMI 23.46 kg/m  SpO2: SpO2: 100 % O2 Device: O2 Device: Room Air O2 Flow Rate: O2 Flow Rate (L/min): 2 L/min  Intake/output summary:   Intake/Output Summary (Last 24 hours) at 01/20/2018 1441 Last data filed at 01/20/2018 0600 Gross per 24 hour  Intake 1701.09 ml  Output 200 ml  Net 1501.09 ml  LBM: Last BM Date: 01/19/18 Baseline Weight: Weight: 69.4 kg (153 lb) Most recent weight: Weight: 70 kg (154 lb 5.2 oz)       Palliative Assessment/Data:    Flowsheet Rows     Most Recent Value  Intake Tab  Referral Department  Hospitalist  Unit at Time of Referral  ICU  Palliative Care Primary Diagnosis  Sepsis/Infectious Disease  Date Notified  01/18/18  Palliative Care Type  New Palliative care  Reason for referral  Clarify Goals of Care  Date of Admission  02/01/2018  Date first seen by Palliative Care  01/18/18  # of days Palliative referral response time  0 Day(s)  # of days IP prior to Palliative referral  1  Clinical Assessment  Palliative Performance Scale Score  10%  Psychosocial & Spiritual Assessment  Palliative Care Outcomes      Patient Active Problem List   Diagnosis Date Noted  . Abdominal aortic aneurysm (AAA) without rupture ([place])   . MRSA bacteremia   . Proctitis 01/18/2018  . Hypernatremia 01/18/2018  . Acute kidney injury superimposed on CKD ([place]) 01/18/2018  . Hyperkalemia 01/18/2018  . Transaminitis 01/18/2018  . Toxic encephalopathy 01/18/2018  . Gout 01/18/2018  . Sepsis ([place]) 01/25/2018  . Acute renal failure ([place])   . Poor appetite 01/02/2018  . Acute on chronic respiratory failure with hypoxemia ([place]) 11/22/2017  . HCAP (healthcare-associated pneumonia) 11/22/2017  . Aspiration pneumonia ([place]) 11/22/2017  . Rash and nonspecific skin eruption 11/22/2017  . COPD with acute exacerbation ([place]) 10/18/2017  . Type 2 diabetes mellitus with  other specified complication ([place]) 93/23/5573  . Pruritic rash 08/14/2017  . Pain and swelling of toe of left foot 08/14/2017  . Stage I pressure ulcer 04/12/2016  . CKD (chronic kidney disease), stage IV ([place]) 04/11/2016  . QT prolongation 04/11/2016  . Hypokalemia 04/11/2016  . Chronic combined systolic and diastolic CHF (congestive heart failure) ([place]) 01/11/2016  . GERD (gastroesophageal reflux disease) 05/28/2015  . COPD (chronic obstructive pulmonary disease) ([place]) 05/10/2015  . Essential hypertension 05/10/2015    Palliative Care Assessment & Plan   Patient Profile: 84 y.o. male  with past medical history of COPD, CKD, CHF, HTN long term care resident admitted on 02/08/2018 with altered mental status, hypernatremia, AKI, and MRSA bacteremia.   Assessment: Patient Active Problem List   Diagnosis Date Noted  . Abdominal aortic aneurysm (AAA) without rupture ([place])   . MRSA bacteremia   . Proctitis 01/18/2018  . Hypernatremia 01/18/2018  . Acute kidney injury superimposed on CKD ([place]) 01/18/2018  . Hyperkalemia 01/18/2018  . Transaminitis 01/18/2018  . Toxic encephalopathy 01/18/2018  . Gout 01/18/2018  . Sepsis ([place]) 02/13/2018  . Acute renal failure ([place])   . Poor appetite 01/02/2018  . Acute on chronic respiratory failure with hypoxemia ([place]) 11/22/2017  . HCAP (healthcare-associated pneumonia) 11/22/2017  . Aspiration pneumonia ([place]) 11/22/2017  . Rash and nonspecific skin eruption 11/22/2017  . COPD with acute exacerbation ([place]) 10/18/2017  . Type 2 diabetes mellitus with other specified complication ([place]) 22/07/5425  . Pruritic rash 08/14/2017  . Pain and swelling of toe of left foot 08/14/2017  . Stage I pressure ulcer 04/12/2016  . CKD (chronic kidney disease), stage IV ([place]) 04/11/2016  . QT prolongation 04/11/2016  . Hypokalemia 04/11/2016  . Chronic combined systolic and diastolic CHF (congestive heart failure) ([place]) 01/11/2016  . GERD (gastroesophageal  reflux disease) 05/28/2015  . COPD (chronic obstructive pulmonary disease) ([place]) 05/10/2015  . Essential hypertension  05/10/2015    Recommendations/Plan: His sister reports understanding his multiple medical problems and poor clinical status.   She would like to continue with current therapies but understands that this will likely be a terminal event.  She seems at peace that he may die, but is struggling with decision to transition to comfort focus only.  She is very clear at the same time that she does not want him to suffer.  We discussed plan to continue current therapies, no escalation of care in the event of decompensation, and use medication for pain and SOB regardless of potential side effects including hypotension and respiratory depression if needed to ensure he is comfortable.  Discussed with bedside RN who reports he has been responding to fentanyl, but will likely benefit from increase in dose.  Changed fentanyl to 50-185mg Q 1 hour as needed.  Goals of Care and Additional Recommendations:  Limitations on Scope of Treatment: No escalation of care/no aggressive interventions  Code Status:    Code Status Orders  (From admission, onward)        Start     Ordered   01/18/18 0721  Do not attempt resuscitation (DNR)  Continuous     01/18/18 0721    Code Status History    Date Active Date Inactive Code Status Order ID Comments User Context   02/04/2018 1717 01/18/2018 0721 Full Code [id number] PElodia Florence, MD ED   11/12/2017 1510 11/17/2017 1842 Full Code [id number] DEdwin Dada MD ED   10/18/2017 2315 10/25/2017 2105 Full Code [id number] YKarmen Bongo MD Inpatient   08/14/2017 2209 08/22/2017 1627 Full Code [id number] SNorval Morton MD ED   09/07/2016 1820 09/11/2016 1721 Full Code [id number] KDixie Dials MD Inpatient   04/11/2016 1850 04/12/2016 1950 Full Code [id number] XFlorencia Reasons MD Inpatient   03/15/2016 1839 03/18/2016 1539 Full Code [id number] KDixie Dials  MD Inpatient   09/07/2015 1554 09/14/2015 1712 Full Code [id number] [place], TFonnie Mu NP Inpatient       Prognosis:   Poor  Discharge Planning:  To Be Determined- I believe this is very likely to be a terminal admission  Care plan was discussed with RN, Dr. FVenetia Constable Thank you for allowing the Palliative Medicine Team to assist in the care of this patient.   Time In: 1400 Time Out: 1450 Total Time 50 Prolonged Time Billed No      Greater than 50%  of this time was spent counseling and coordinating care related to the above assessment and plan.  GMicheline Rough MD  Please contact Palliative Medicine Team phone at [phone] questions and concerns.

## 2018-01-20 NOTE — Progress Notes (Signed)
Pharmacy Antibiotic Note  Greg RobertsJonathan Jones is a 84 y.o. male admitted on 02/05/2018 with proctitis and MRSA bacteremiaPharmacy has been consulted for Vancomycin dosing.  Today, 01/20/18  Random vanc level 19 this AM at 0400, dose of 1000mg  given 8/3 at 0930  SCr 4.53 improved from 5.05  Afebrile  Plan: Day 3 Vancomycin 1) Based on random vanc levels taken last 2 days and improving SCr, will start vancomycin 500mg  IV q36 - goal AUC 400-550 2) Daily SCr and check vanc levels prn   Height: 5\' 8"  (172.7 cm) Weight: 154 lb 5.2 oz (70 kg) IBW/kg (Calculated) : 68.4  Temp (24hrs), Avg:97.1 F (36.2 C), Min:96.3 F (35.7 C), Max:97.7 F (36.5 C)  Recent Labs  Lab 03/11/2018 1217 03/11/2018 1220 03/11/2018 1413 03/11/2018 1734 03/11/2018 2039 01/18/18 0253 01/19/18 0445 01/20/18 0402  WBC 14.9*  --   --   --   --  9.5  --   --   CREATININE 6.02*  --   --  5.71*  --  5.55* 5.05* 4.53*  LATICACIDVEN  --  6.93* 5.43* 3.3* 3.8*  --   --   --   VANCORANDOM  --   --   --   --   --   --  7 19    Estimated Creatinine Clearance: 10.9 mL/min (A) (by C-G formula based on SCr of 4.53 mg/dL (H)).    Allergies  Allergen Reactions  . Other Other (See Comments)    Oxygen Sensitive  . Penicillins Rash and Other (See Comments)    Any -illins as well as Mycins Has patient had a PCN reaction causing immediate rash, facial/tongue/throat swelling, SOB or lightheadedness with hypotension: Yes Has patient had a PCN reaction causing severe rash involving mucus membranes or skin necrosis: Yes Has patient had a PCN reaction that required hospitalization No Has patient had a PCN reaction occurring within the last 10 years: No If all of the above answers are "NO", then may proceed with Cephalosporin use.   . Vitamin B12 Itching, Rash and Other (See Comments)    injections    Thank you for allowing pharmacy to be a part of this patient's care.  Greg Jones, Greg Jones 01/20/2018 10:03 AM

## 2018-01-20 NOTE — Progress Notes (Signed)
Subjective: Pt unresponsive   Antibiotics:  Anti-infectives (From admission, onward)   Start     Dose/Rate Route Frequency Ordered Stop   01/20/18 2100  vancomycin (VANCOCIN) 500 mg in sodium chloride 0.9 % 100 mL IVPB     500 mg 100 mL/hr over 60 Minutes Intravenous Every 36 hours 01/20/18 1006     01/19/18 1000  vancomycin (VANCOCIN) IVPB 1000 mg/200 mL premix     1,000 mg 200 mL/hr over 60 Minutes Intravenous  Once 01/19/18 0745 01/19/18 1033   01/19/18 0740  vancomycin variable dose per unstable renal function (pharmacist dosing)  Status:  Discontinued      Does not apply See admin instructions 01/19/18 0740 01/20/18 1006   01/18/18 2200  ceFEPIme (MAXIPIME) 500 mg in dextrose 5 % 50 mL IVPB  Status:  Discontinued     500 mg 100 mL/hr over 30 Minutes Intravenous Every 24 hours 03-Feb-2018 1727 01/19/18 0653   01/18/18 1000  vancomycin (VANCOCIN) 500 mg in sodium chloride 0.9 % 100 mL IVPB     500 mg 100 mL/hr over 60 Minutes Intravenous  Once 01/18/18 0918 01/18/18 1304   02-03-2018 2200  metroNIDAZOLE (FLAGYL) IVPB 500 mg     500 mg 100 mL/hr over 60 Minutes Intravenous Every 8 hours 02/03/2018 1716     03-Feb-2018 1415  ceFEPIme (MAXIPIME) 2 g in sodium chloride 0.9 % 100 mL IVPB     2 g 200 mL/hr over 30 Minutes Intravenous  Once 02/03/18 1406 02-03-2018 1445   03-Feb-2018 1415  metroNIDAZOLE (FLAGYL) IVPB 500 mg     500 mg 100 mL/hr over 60 Minutes Intravenous  Once 2018/02/03 1406 2018/02/03 1458      Medications: Scheduled Meds: . albuterol  2.5 mg Nebulization Q6H  . chlorhexidine  15 mL Mouth Rinse BID  . Chlorhexidine Gluconate Cloth  6 each Topical Q0600  . glycopyrrolate  0.2 mg Intravenous Q4H  . heparin  5,000 Units Subcutaneous Q8H  . insulin aspart  0-5 Units Subcutaneous QHS  . insulin aspart  0-9 Units Subcutaneous TID WC  . mouth rinse  15 mL Mouth Rinse q12n4p  . mometasone-formoterol  2 puff Inhalation BID  . mupirocin ointment  1 application Nasal BID    . tiotropium  18 mcg Inhalation Q24H   Continuous Infusions: . dextrose 50 mL/hr (01/20/18 0737)  . famotidine (PEPCID) IV Stopped (01/20/18 1042)  . metronidazole Stopped (01/20/18 1529)  . vancomycin     PRN Meds:.albuterol, fentaNYL (SUBLIMAZE) injection, labetalol, ondansetron **OR** ondansetron (ZOFRAN) IV    Objective: Weight change: 1 lb 5.2 oz (0.6 kg)  Intake/Output Summary (Last 24 hours) at 01/20/2018 1704 Last data filed at 01/20/2018 0600 Gross per 24 hour  Intake 1446.05 ml  Output -  Net 1446.05 ml   Blood pressure 93/65, pulse 94, temperature 97.8 F (36.6 C), temperature source Axillary, resp. rate (!) 21, height 5\' 8"  (1.727 m), weight 154 lb 5.2 oz (70 kg), SpO2 100 %. Temp:  [96.9 F (36.1 C)-97.8 F (36.6 C)] 97.8 F (36.6 C) (08/04 1600) Pulse Rate:  [90-105] 94 (08/04 1400) Resp:  [15-26] 21 (08/04 0630) BP: (93-170)/(60-140) 93/65 (08/04 1400) SpO2:  [99 %-100 %] 100 % (08/04 1400) Arterial Line BP: (77-143)/(48-118) 80/64 (08/04 1400) Weight:  [154 lb 5.2 oz (70 kg)] 154 lb 5.2 oz (70 kg) (08/04 0500)  Physical Exam: General: obtunded, rhonchorous airway sounds HEENT:[place]/AT [facility] regular rate, normal  No mgr Chest: ,  rhonchorous airway sounds Abdomen: soft non-distended,  Extremities/Skin:  01/19/18:      Neuro: nonfocal  CBC:    BMET Recent Labs    01/19/18 0445 01/20/18 0402  NA 157* 153*  K 4.3 3.8  CL 125* 119*  CO2 17* 20*  GLUCOSE 215* 150*  BUN 86* 77*  CREATININE 5.05* 4.53*  CALCIUM 8.1* 8.3*     Liver Panel  Recent Labs    01/18/18 0253  PROT 6.5  ALBUMIN 2.1*  AST 74*  ALT 47*  ALKPHOS 184*  BILITOT 0.4       Sedimentation Rate No results for input(s): ESRSEDRATE in the last 72 hours. C-Reactive Protein No results for input(s): CRP in the last 72 hours.  Micro Results: Recent Results (from the past 720 hour(s))  Culture, blood (routine x 2)     Status: Abnormal   Collection Time: 02/15/2018 11:40  AM  Result Value Ref Range Status   Specimen Description   Final    BLOOD RIGHT ANTECUBITAL Performed at [facility], [address] [place], [place] [address]    Special Requests   Final    BOTTLES DRAWN AEROBIC AND ANAEROBIC Blood Culture adequate volume Performed at [facility], [address] [place], [place] [address]    Culture  Setup Time   Final    IN BOTH AEROBIC AND ANAEROBIC BOTTLES GRAM POSITIVE COCCI CRITICAL RESULT CALLED TO, READ BACK BY AND VERIFIED WITH: PHARMD J GADHIA 01/18/18 AT 823 AM BY CM Performed at [facility], [address], [place], [place] [address]    Culture METHICILLIN RESISTANT STAPHYLOCOCCUS AUREUS (A)  Final   Report Status 01/20/2018 FINAL  Final   Organism ID, Bacteria METHICILLIN RESISTANT STAPHYLOCOCCUS AUREUS  Final      Susceptibility   Methicillin resistant staphylococcus aureus - MIC*    CIPROFLOXACIN >=8 RESISTANT Resistant     ERYTHROMYCIN >=8 RESISTANT Resistant     GENTAMICIN <=0.5 SENSITIVE Sensitive     OXACILLIN >=4 RESISTANT Resistant     TETRACYCLINE >=16 RESISTANT Resistant     VANCOMYCIN 1 SENSITIVE Sensitive     TRIMETH/SULFA >=320 RESISTANT Resistant     CLINDAMYCIN >=8 RESISTANT Resistant     RIFAMPIN <=0.5 SENSITIVE Sensitive     Inducible Clindamycin NEGATIVE Sensitive     * METHICILLIN RESISTANT STAPHYLOCOCCUS AUREUS  Culture, blood (routine x 2)     Status: Abnormal   Collection Time: 01/27/2018 11:40 AM  Result Value Ref Range Status   Specimen Description   Final    BLOOD BLOOD RIGHT FOREARM Performed at [facility], [address], [place], [place] [address]    Special Requests   Final    BOTTLES DRAWN AEROBIC ONLY Blood Culture adequate volume Performed at [facility], [address], [place], [place] [address]    Culture  Setup Time   Final    GRAM POSITIVE COCCI AEROBIC BOTTLE ONLY CRITICAL VALUE NOTED.  VALUE IS  CONSISTENT WITH PREVIOUSLY REPORTED AND CALLED VALUE.    Culture (A)  Final    STAPHYLOCOCCUS AUREUS SUSCEPTIBILITIES PERFORMED ON PREVIOUS CULTURE WITHIN THE LAST 5 DAYS. Performed at [facility], [address], [place], [place] [address]    Report Status 01/20/2018 FINAL  Final  Blood Culture ID Panel (Reflexed)     Status: Abnormal   Collection Time: 01/19/2018 11:40 AM  Result Value Ref Range Status   Enterococcus species NOT DETECTED NOT  DETECTED Final   Listeria monocytogenes NOT DETECTED NOT DETECTED Final   Staphylococcus species DETECTED (A) NOT DETECTED Final    Comment: CRITICAL RESULT CALLED TO, READ BACK BY AND VERIFIED WITH: PHARMD J GADHIA 01/18/18 AT 825 AM BYCM    Staphylococcus aureus DETECTED (A) NOT DETECTED Final    Comment: Methicillin (oxacillin)-resistant Staphylococcus aureus (MRSA). MRSA is predictably resistant to beta-lactam antibiotics (except ceftaroline). Preferred therapy is vancomycin unless clinically contraindicated. Patient requires contact precautions if  hospitalized. CRITICAL RESULT CALLED TO, READ BACK BY AND VERIFIED WITH: PHARMD J GADHIA 01/18/18 AT 825 AM BY CM    Methicillin resistance DETECTED (A) NOT DETECTED Final    Comment: CRITICAL RESULT CALLED TO, READ BACK BY AND VERIFIED WITH: PHARMD J GADHIA 01/18/18 AT 825 BY CM    Streptococcus species NOT DETECTED NOT DETECTED Final   Streptococcus agalactiae NOT DETECTED NOT DETECTED Final   Streptococcus pneumoniae NOT DETECTED NOT DETECTED Final   Streptococcus pyogenes NOT DETECTED NOT DETECTED Final   Acinetobacter baumannii NOT DETECTED NOT DETECTED Final   Enterobacteriaceae species NOT DETECTED NOT DETECTED Final   Enterobacter cloacae complex NOT DETECTED NOT DETECTED Final   Escherichia coli NOT DETECTED NOT DETECTED Final   Klebsiella oxytoca NOT DETECTED NOT DETECTED Final   Klebsiella pneumoniae NOT DETECTED NOT DETECTED Final   Proteus species NOT DETECTED NOT DETECTED  Final   Serratia marcescens NOT DETECTED NOT DETECTED Final   Haemophilus influenzae NOT DETECTED NOT DETECTED Final   Neisseria meningitidis NOT DETECTED NOT DETECTED Final   Pseudomonas aeruginosa NOT DETECTED NOT DETECTED Final   Candida albicans NOT DETECTED NOT DETECTED Final   Candida glabrata NOT DETECTED NOT DETECTED Final   Candida krusei NOT DETECTED NOT DETECTED Final   Candida parapsilosis NOT DETECTED NOT DETECTED Final   Candida tropicalis NOT DETECTED NOT DETECTED Final    Comment: Performed at [facility], [address], [place], [place] [address]  Urine culture     Status: Abnormal   Collection Time: 2018-01-18  3:07 PM  Result Value Ref Range Status   Specimen Description   Final    URINE, RANDOM Performed at [facility], [address] [place], [place] [address]    Special Requests   Final    NONE Performed at [facility], [address] [place], [place] [address]    Culture MULTIPLE SPECIES PRESENT, SUGGEST RECOLLECTION (A)  Final   Report Status 01/18/2018 FINAL  Final  MRSA PCR Screening     Status: Abnormal   Collection Time: 01-18-2018  6:45 PM  Result Value Ref Range Status   MRSA by PCR POSITIVE (A) NEGATIVE Final    Comment:        The GeneXpert MRSA Assay (FDA approved for NASAL specimens only), is one component of a comprehensive MRSA colonization surveillance program. It is not intended to diagnose MRSA infection nor to guide or monitor treatment for MRSA infections. RESULT CALLED TO, READ BACK BY AND VERIFIED WITH: MCMILLIAN,S AT 2005 ON 01/18/18 BY MOSLEY,J Performed at [facility], [address], [place], [place] [address]   Culture, blood (Routine X 2) w Reflex to ID Panel     Status: Abnormal (Preliminary result)   Collection Time: 01/18/18  9:48 AM  Result Value Ref Range Status   Specimen Description   Final    BLOOD LEFT HAND Performed at [facility], [address] [place], [place] [address]  Special Requests   Final    BOTTLES DRAWN AEROBIC ONLY Blood Culture results may not be optimal due to an inadequate volume of blood received in culture bottles Performed at [facility], [address] [place], [place] [address]    Culture  Setup Time   Final    AEROBIC BOTTLE ONLY GRAM POSITIVE COCCI CRITICAL VALUE NOTED.  VALUE IS CONSISTENT WITH PREVIOUSLY REPORTED AND CALLED VALUE. Performed at [facility], [address] [place], [place] [address]    Culture STAPHYLOCOCCUS AUREUS (A)  Final   Report Status PENDING  Incomplete  Culture, blood (Routine X 2) w Reflex to ID Panel     Status: Abnormal (Preliminary result)   Collection Time: 01/18/18  9:58 AM  Result Value Ref Range Status   Specimen Description   Final    BLOOD LEFT ARM Performed at [facility], [address] [place], [place] [address]    Special Requests   Final    BOTTLES DRAWN AEROBIC ONLY Blood Culture results may not be optimal due to an inadequate volume of blood received in culture bottles Performed at [facility], [address] [place], [place] [address]    Culture  Setup Time   Final    AEROBIC BOTTLE ONLY GRAM POSITIVE COCCI CRITICAL VALUE NOTED.  VALUE IS CONSISTENT WITH PREVIOUSLY REPORTED AND CALLED VALUE. Performed at [facility], [address] [place], [place] [address]    Culture STAPHYLOCOCCUS AUREUS (A)  Final   Report Status PENDING  Incomplete  Culture, blood (Routine X 2) w Reflex to ID Panel     Status: None (Preliminary result)   Collection Time: 01/19/18  8:25 AM  Result Value Ref Range Status   Specimen Description BLOOD BLOOD RIGHT HAND  Final   Special Requests   Final    BOTTLES DRAWN AEROBIC ONLY Blood Culture results may not be optimal due to an inadequate volume of blood received in culture bottles   Culture   Final    NO GROWTH 1 DAY Performed  at [facility], [address] [place], [place] [address]    Report Status PENDING  Incomplete  Culture, blood (Routine X 2) w Reflex to ID Panel     Status: None (Preliminary result)   Collection Time: 01/19/18  8:26 AM  Result Value Ref Range Status   Specimen Description   Final    BLOOD LEFT HAND Performed at [facility], [address] [place], [place] [address]    Special Requests   Final    BOTTLES DRAWN AEROBIC ONLY Blood Culture adequate volume Performed at [facility], [address] [place], [place] [address]    Culture   Final    NO GROWTH 1 DAY Performed at [facility], [address] [place], [place] [address]    Report Status PENDING  Incomplete    Studies/Results: [place] Renal  Result Date: 01/20/2018 CLINICAL DATA:  Acute kidney injury. EXAM: RENAL / URINARY TRACT ULTRASOUND COMPLETE COMPARISON:  CT abdomen and pelvis 03-Mar-2018 FINDINGS: Right Kidney: Length: 9.1 cm. Mild cortical thinning. Borderline to mildly increased parenchymal echogenicity. No mass or hydronephrosis visualized. Left Kidney: Length: 9.1 cm. Mild cortical thinning and increased parenchymal echogenicity. No hydronephrosis. 1.6 cm upper pole cyst. Bladder: Appears normal for degree of bladder distention. IMPRESSION: Findings of medical renal disease.  No hydronephrosis. Electronically Signed   By: Sebastian AcheAllen  Grady  M.D.   On: 01/20/2018 12:05   Dg Chest Port 1 View  Result Date: 01/19/2018 CLINICAL DATA:  Shortness of breath. EXAM: PORTABLE CHEST 1 VIEW COMPARISON:  01/18/2018 FINDINGS: The patient is mildly rotated to the right. The cardiomediastinal silhouette is unchanged mild right basilar opacity is unchanged. The left lung remains clear. No sizable pleural effusion or pneumothorax is identified. IMPRESSION: Unchanged right basilar opacity which could reflect pneumonia or atelectasis. Electronically Signed   By: Sebastian Ache M.D.   On: 01/19/2018  07:19      Assessment/Plan:  INTERVAL HISTORY: pts repeat blood cultures  01/19/18 no growth  Active Problems:   Essential hypertension   Chronic combined systolic and diastolic CHF (congestive heart failure) (HCC)   CKD (chronic kidney disease), stage IV (HCC)   Stage I pressure ulcer   Type 2 diabetes mellitus with other specified complication (HCC)   Rash and nonspecific skin eruption   Sepsis (HCC)   Proctitis   Hypernatremia   Acute kidney injury superimposed on CKD (HCC)   Hyperkalemia   Transaminitis   Toxic encephalopathy   Gout   Abdominal aortic aneurysm (AAA) without rupture (HCC)   MRSA bacteremia    Greg Jones is a 84 y.o. male with  Severe COPD, CKD3 with chronic rash admitted with obtundation and septis due to MRSA bacteremia. He was also being treated for proctitis based on imaging  #1 MRSA bacteremia:  His prognosis appears grim  For completeness sake SAB checklist follows       [place] [facility] Staphylococcus aureus bacteremia   Staphylococcus aureus bacteremia (SAB) is associated with a high rate of complications and mortality.  Specific aspects of clinical management are critical to optimizing the outcome of patients with SAB.  Therefore, the [facility] Antimicrobial Management Team [facility]) has initiated an intervention aimed at improving the management of SAB at [facility].  To do so, Infectious Diseases physicians are providing an evidence-based consult for the management of all patients with SAB.     Yes No Comments  Perform follow-up blood cultures (even if the patient is afebrile) to ensure clearance of bacteremia [x]  []   yesterday's cultures are without growth so far  Remove vascular catheter and obtain follow-up blood cultures after the removal of the catheter [x]  []    Perform echocardiography to evaluate for endocarditis (transthoracic ECHO is 40-50% sensitive, TEE is > 90% sensitive) [x]  []  Please keep in mind,  that neither test can definitively EXCLUDE endocarditis, and that should clinical suspicion remain high for endocarditis the patient should then still be treated with an "endocarditis" duration of therapy = 6 weeks  TEE too dangerous  Consult electrophysiologist to evaluate implanted cardiac device (pacemaker, ICD) []  []    Ensure source control []  []  Have all abscesses been drained effectively? Have deep seeded infections (septic joints or osteomyelitis) had appropriate surgical debridement? Skin possibly  Investigate for "metastatic" sites of infection []  []  Does the patient have ANY symptom or physical exam finding that would suggest a deeper infection (back or neck pain that may be suggestive of vertebral osteomyelitis or epidural abscess, muscle pain that could be a symptom of pyomyositis)?  Keep in mind that for deep seeded infections MRI imaging with contrast is preferred rather than other often insensitive tests such as plain x-rays, especially early in a patient's presentation.  Change antibiotic therapy to vancomycin [x]  []  Beta-lactam antibiotics are preferred for MSSA due to higher cure rates.   If on Vancomycin, goal  trough should be 15 - 20 mcg/mL  Estimated duration of IV antibiotic therapy:  6 weeks if he survives []  []  Consult case management for probably prolonged outpatient IV antibiotic therapy   #3 Acute on CKD: would not advocate for HD  BMP Latest Ref Rng & Units 01/20/2018 01/19/2018 01/18/2018  Glucose 70 - 99 mg/dL 295(A) 213(Y) 865(H)  BUN 8 - 23 mg/dL 84(O) 96(E) 95(M)  Creatinine 0.61 - 1.24 mg/dL 8.41(L) 2.44(W) 1.02(V)  Sodium 135 - 145 mmol/L 153(H) 157(H) 158(H)  Potassium 3.5 - 5.1 mmol/L 3.8 4.3 5.0  Chloride 98 - 111 mmol/L 119(H) 125(H) 121(H)  CO2 22 - 32 mmol/L 20(L) 17(L) 20(L)  Calcium 8.9 - 10.3 mg/dL 8.3(L) 8.1(L) 8.3(L)     #4 Goals of care: I would STRONGLY encourage to push towards pure palliativee care.   LOS: 3 days   Acey Lav 01/20/2018, 5:04 PM

## 2018-01-20 NOTE — Progress Notes (Signed)
SLP Cancellation Note  Patient Details Name: Greg Jones MRN: [id number] DOB: 01/28/1928   Cancelled treatment:       Reason Eval/Treat Not Completed: Fatigue/lethargy limiting ability to participate; spoke with nursing, will hold evaluation, follow up next date. RN to page if pt alert/appropriate for swallow assessment.  Greg Jones, [place], CCC-SLP Speech-Language Pathologist [phone]    Greg Jones 01/20/2018, 1:46 PM

## 2018-01-20 NOTE — Progress Notes (Signed)
[facility] PROGRESS NOTE    Progress Note  Greg Jones  ZOX:[id number] DOB: July 13, 1927 DOA: 02/03/2018 PCP: Margit Hanks, MD     Brief Narrative:   Greg Jones is an 84 y.o. male past medical history of chronic kidney disease, COPD hypertension presented with sepsis and abdominal pain in the ED a CT scan was done that showed evidence of proctitis  Assessment/Plan:   Sepsis due MRSA bacteremia possibly proctitis Started empirically on sepsis pathway on IV antibiotics and IV fluids. PCR ID reflex panel show MRSA. ID was consulted recommended to discontinue IV cefepime and continue IV vancomycin, will also continue IV Flagyl. We will repeat blood cultures on 01/19/2018. He has remained afebrile.  2D echo showed chronic diastolic heart failure cannot rule out aortic vegetation due to poor image quality we will discuss with ID to see if they want to proceed with TEE. Looking at the overall picture I think he is actively dying, awaiting hospice and palliative care meeting with family results.  Acute kidney injury on chronic kidney disease stage III: Likely prerenal in the setting of sepsis and ongoing Lasix use. Baseline creatinine around 2. Is positive about 5 L he has responded poorly to IV fluid hydration, he is about 6 L positive we will go ahead and decrease IV fluids to normal size vena cava. I am afraid that the options are limited he is not a dialysis candidate and his renal insufficiency is not improving with aggressive treatment.  We will check a new renal ultrasound. We will continue aggressive measures.  Hypovolemic hypernatremia: Improving slowly with D5W continue strict I's and O's continue to follow Bumex closely.  Transaminitis: The setting of dehydration and sepsis, improving.  Acute toxic encephalopathy: Likely due to sepsis and probably hypernatremia As per family member he is a DNR/DNI. Patient condition has been deteriorating over the last  month. Hospice and palliative care to meet with family today.  Rash: Diffuse desquamative rash seems to be like a drug reaction.  Pressure ulcer bilateral of the heels: Wound care has been consulted to try to relieve pressure.  Diabetes mellitus type 2: Continue sliding scale insulin. Last A1c was 8 on 12/2017.  Chronic diastolic and systolic heart failure: Continue strict I's and O's he appears to be dry, making urine but poor urine output recorded. He is contracted cannot evaluate his fluid status will place a PICC line check CVP every 12's. Abdominal aortic aneurysm: We will need to follow-up with vascular as an outpatient.  Essential hypertension Seems to be slightly high we will try to keep it on the high side due to his new acute renal failure, will use labetalol for systolic blood pressure greater than 190.  Stage I pressure ulcer of heels: WOC      DVT prophylaxis: lovenox Family Communication:Sister Disposition Plan/Barrier to D/C: unable to determine Code Status:     Code Status Orders  (From admission, onward)        Start     Ordered   01/29/2018 1717  Full code  Continuous     02/02/2018 1716    Code Status History    Date Active Date Inactive Code Status Order ID Comments User Context   11/12/2017 1510 11/17/2017 1842 Full Code [id number]  Alberteen Sam, MD ED   10/18/2017 2315 10/25/2017 2105 Full Code [id number]  Jonah Blue, MD Inpatient   08/14/2017 2209 08/22/2017 1627 Full Code [id number]  Clydie Braun, MD ED   09/07/2016 1820 09/11/2016 1721  Full Code [id number]  Orpah Cobb, MD Inpatient   04/11/2016 1850 04/12/2016 1950 Full Code [id number]  Albertine Grates, MD Inpatient   03/15/2016 1839 03/18/2016 1539 Full Code [id number]  Orpah Cobb, MD Inpatient   09/07/2015 1554 09/14/2015 1712 Full Code [id number]  Julio Sicks, NP Inpatient        IV Access:    Peripheral IV   Procedures and diagnostic studies:   Dg Chest Port 1 View  Result  Date: 01/19/2018 CLINICAL DATA:  Shortness of breath. EXAM: PORTABLE CHEST 1 VIEW COMPARISON:  01/18/2018 FINDINGS: The patient is mildly rotated to the right. The cardiomediastinal silhouette is unchanged mild right basilar opacity is unchanged. The left lung remains clear. No sizable pleural effusion or pneumothorax is identified. IMPRESSION: Unchanged right basilar opacity which could reflect pneumonia or atelectasis. Electronically Signed   By: Sebastian Ache M.D.   On: 01/19/2018 07:19   Dg Chest Port 1 View  Result Date: 01/18/2018 CLINICAL DATA:  Sepsis and shortness of breath. EXAM: PORTABLE CHEST 1 VIEW COMPARISON:  2018-01-30 FINDINGS: The heart size is stable and normal. Stable tortuosity of the thoracic aorta. Mild new opacity at the right lung base represents either atelectasis or developing infiltrate. There is no evidence of pulmonary edema, pneumothorax, nodule or pleural fluid. Both lungs are clear. The visualized skeletal structures are unremarkable. IMPRESSION: Mild new opacity at the right lung base representing either atelectasis or developing pneumonia. Electronically Signed   By: Irish Lack M.D.   On: 01/18/2018 08:34     Medical Consultants:    None.  Anti-Infectives:   IV cefepime and flagyl  Subjective:    Greg Jones patient continues to be nonverbal moaning and groaning.  Objective:    Vitals:   01/20/18 0530 01/20/18 0600 01/20/18 0630 01/20/18 0700  BP:      Pulse: 96 94 90 97  Resp: 16 (!) 26 (!) 21   Temp:      TempSrc:      SpO2: 100% 100% 100% 100%  Weight:      Height:        Intake/Output Summary (Last 24 hours) at 01/20/2018 0705 Last data filed at 01/20/2018 0600 Gross per 24 hour  Intake 1854.69 ml  Output 325 ml  Net 1529.69 ml   Filed Weights   01/18/18 0500 01/18/18 1019 01/20/18 0500  Weight: 69.4 kg (153 lb) 69.4 kg (153 lb) 70 kg (154 lb 5.2 oz)    Exam: General exam: In no acute distress. Respiratory system: Good air  movement and clear to auscultation. Cardiovascular system: S1 & S2 heard, RRR. No JVD. Gastrointestinal system: Abdomen is nondistended, soft and tenderness. Central nervous system: Not alert or oriented.. No focal neurological deficits. Extremities: No pedal edema. Skin: scaling desquamative.    Data Reviewed:    Labs: Basic Metabolic Panel: Recent Labs  Lab January 30, 2018 1217 01-30-18 1734 01/18/18 0253 01/19/18 0445 01/20/18 0402  NA 160* 158* 158* 157* 153*  K 6.0* 5.5* 5.0 4.3 3.8  CL 114* 120* 121* 125* 119*  CO2 23 22 20* 17* 20*  GLUCOSE 231* 192* 128* 215* 150*  BUN 82* 80* 86* 86* 77*  CREATININE 6.02* 5.71* 5.55* 5.05* 4.53*  CALCIUM 9.5 8.4* 8.3* 8.1* 8.3*  MG  --   --  2.4  --   --    GFR Estimated Creatinine Clearance: 10.9 mL/min (A) (by C-G formula based on SCr of 4.53 mg/dL (H)). Liver Function Tests: Recent Labs  Lab 2018/01/30  1215-11-06 01/18/18 0253  AST 80* 74*  ALT 59* 47*  ALKPHOS 259* 184*  BILITOT 0.7 0.4  PROT 8.0 6.5  ALBUMIN 2.5* 2.1*   Recent Labs  Lab 01/20/2018 1217  LIPASE 21   No results for input(s): AMMONIA in the last 168 hours. Coagulation profile No results for input(s): INR, PROTIME in the last 168 hours.  CBC: Recent Labs  Lab 01/28/2018 1217 01/18/18 0253  WBC 14.9* 9.5  NEUTROABS 12.9*  --   HGB 12.8* 10.4*  HCT 41.2 33.1*  MCV 83.1 80.9  PLT 387 288   Cardiac Enzymes: No results for input(s): CKTOTAL, CKMB, CKMBINDEX, TROPONINI in the last 168 hours. BNP (last 3 results) No results for input(s): PROBNP in the last 8760 hours. CBG: Recent Labs  Lab 01/18/18 11-05-52 01/19/18 0757 01/19/18 1133 01/19/18 1513 01/19/18 Nov 06, 2119  GLUCAP 147* 157* 157* 121* 96   D-Dimer: No results for input(s): DDIMER in the last 72 hours. Hgb A1c: No results for input(s): HGBA1C in the last 72 hours. Lipid Profile: No results for input(s): CHOL, HDL, LDLCALC, TRIG, CHOLHDL, LDLDIRECT in the last 72 hours. Thyroid function  studies: Recent Labs    02/06/2018 11/05/37  TSH 2.720   Anemia work up: No results for input(s): VITAMINB12, FOLATE, FERRITIN, TIBC, IRON, RETICCTPCT in the last 72 hours. Sepsis Labs: Recent Labs  Lab 01/22/2018 1217 02/10/2018 1220 01/21/2018 1413 02/12/2018 1734 01/22/2018 05-Nov-2037 01/18/18 0253  WBC 14.9*  --   --   --   --  9.5  LATICACIDVEN  --  6.93* 5.43* 3.3* 3.8*  --    Microbiology Recent Results (from the past 240 hour(s))  Culture, blood (routine x 2)     Status: Abnormal (Preliminary result)   Collection Time: 02/11/2018 11:40 AM  Result Value Ref Range Status   Specimen Description   Final    BLOOD RIGHT ANTECUBITAL Performed at [facility], [address] [place], [place] [address]    Special Requests   Final    BOTTLES DRAWN AEROBIC AND ANAEROBIC Blood Culture adequate volume Performed at [facility], [address] [place], [place] [address]    Culture  Setup Time   Final    IN BOTH AEROBIC AND ANAEROBIC BOTTLES GRAM POSITIVE COCCI CRITICAL RESULT CALLED TO, READ BACK BY AND VERIFIED WITH: PHARMD J GADHIA 01/18/18 AT 823 AM BY CM    Culture (A)  Final    STAPHYLOCOCCUS AUREUS SUSCEPTIBILITIES TO FOLLOW Performed at [facility], [address], [place], [place] [address]    Report Status PENDING  Incomplete  Culture, blood (routine x 2)     Status: Abnormal (Preliminary result)   Collection Time: 01/30/2018 11:40 AM  Result Value Ref Range Status   Specimen Description   Final    BLOOD BLOOD RIGHT FOREARM Performed at [facility], [address], [place], [place] [address]    Special Requests   Final    BOTTLES DRAWN AEROBIC ONLY Blood Culture adequate volume Performed at [facility], [address], [place], [place] [address]    Culture  Setup Time   Final    GRAM POSITIVE COCCI AEROBIC BOTTLE ONLY CRITICAL VALUE NOTED.  VALUE IS CONSISTENT WITH PREVIOUSLY REPORTED AND CALLED  VALUE. Performed at [facility], [address], [place], [place] [address]    Culture STAPHYLOCOCCUS AUREUS (A)  Final   Report Status PENDING  Incomplete  Blood Culture ID Panel (  Reflexed)     Status: Abnormal   Collection Time: 02/09/2018 11:40 AM  Result Value Ref Range Status   Enterococcus species NOT DETECTED NOT DETECTED Final   Listeria monocytogenes NOT DETECTED NOT DETECTED Final   Staphylococcus species DETECTED (A) NOT DETECTED Final    Comment: CRITICAL RESULT CALLED TO, READ BACK BY AND VERIFIED WITH: PHARMD J GADHIA 01/18/18 AT 825 AM BYCM    Staphylococcus aureus DETECTED (A) NOT DETECTED Final    Comment: Methicillin (oxacillin)-resistant Staphylococcus aureus (MRSA). MRSA is predictably resistant to beta-lactam antibiotics (except ceftaroline). Preferred therapy is vancomycin unless clinically contraindicated. Patient requires contact precautions if  hospitalized. CRITICAL RESULT CALLED TO, READ BACK BY AND VERIFIED WITH: PHARMD J GADHIA 01/18/18 AT 825 AM BY CM    Methicillin resistance DETECTED (A) NOT DETECTED Final    Comment: CRITICAL RESULT CALLED TO, READ BACK BY AND VERIFIED WITH: PHARMD J GADHIA 01/18/18 AT 825 BY CM    Streptococcus species NOT DETECTED NOT DETECTED Final   Streptococcus agalactiae NOT DETECTED NOT DETECTED Final   Streptococcus pneumoniae NOT DETECTED NOT DETECTED Final   Streptococcus pyogenes NOT DETECTED NOT DETECTED Final   Acinetobacter baumannii NOT DETECTED NOT DETECTED Final   Enterobacteriaceae species NOT DETECTED NOT DETECTED Final   Enterobacter cloacae complex NOT DETECTED NOT DETECTED Final   Escherichia coli NOT DETECTED NOT DETECTED Final   Klebsiella oxytoca NOT DETECTED NOT DETECTED Final   Klebsiella pneumoniae NOT DETECTED NOT DETECTED Final   Proteus species NOT DETECTED NOT DETECTED Final   Serratia marcescens NOT DETECTED NOT DETECTED Final   Haemophilus influenzae NOT DETECTED NOT DETECTED Final   Neisseria  meningitidis NOT DETECTED NOT DETECTED Final   Pseudomonas aeruginosa NOT DETECTED NOT DETECTED Final   Candida albicans NOT DETECTED NOT DETECTED Final   Candida glabrata NOT DETECTED NOT DETECTED Final   Candida krusei NOT DETECTED NOT DETECTED Final   Candida parapsilosis NOT DETECTED NOT DETECTED Final   Candida tropicalis NOT DETECTED NOT DETECTED Final    Comment: Performed at [facility], [address], [place], [place] [address]  Urine culture     Status: Abnormal   Collection Time: 02/14/2018  3:07 PM  Result Value Ref Range Status   Specimen Description   Final    URINE, RANDOM Performed at [facility], [address], [place], [place] [address]    Special Requests   Final    NONE Performed at [facility], [address], [place], [place] [address]    Culture MULTIPLE SPECIES PRESENT, SUGGEST RECOLLECTION (A)  Final   Report Status 01/18/2018 FINAL  Final  MRSA PCR Screening     Status: Abnormal   Collection Time: 02/02/2018  6:45 PM  Result Value Ref Range Status   MRSA by PCR POSITIVE (A) NEGATIVE Final    Comment:        The GeneXpert MRSA Assay (FDA approved for NASAL specimens only), is one component of a comprehensive MRSA colonization surveillance program. It is not intended to diagnose MRSA infection nor to guide or monitor treatment for MRSA infections. RESULT CALLED TO, READ BACK BY AND VERIFIED WITH: MCMILLIAN,S AT 2005 ON 02/12/2018 BY MOSLEY,J Performed at [facility], [address], [place], [place] [address]   Culture, blood (Routine X 2) w Reflex to ID Panel     Status: None (Preliminary result)   Collection Time: 01/18/18  9:48 AM  Result Value Ref Range Status  Specimen Description   Final    BLOOD LEFT HAND Performed at [facility], [address] [place], [place] [address]    Special Requests   Final    BOTTLES DRAWN AEROBIC ONLY Blood Culture results may  not be optimal due to an inadequate volume of blood received in culture bottles Performed at [facility], [address] [place], [place] [address]    Culture  Setup Time   Final    AEROBIC BOTTLE ONLY GRAM POSITIVE COCCI CRITICAL VALUE NOTED.  VALUE IS CONSISTENT WITH PREVIOUSLY REPORTED AND CALLED VALUE.    Culture   Final    NO GROWTH 1 DAY Performed at [facility], [address] [place], [place] [address]    Report Status PENDING  Incomplete  Culture, blood (Routine X 2) w Reflex to ID Panel     Status: None (Preliminary result)   Collection Time: 01/18/18  9:58 AM  Result Value Ref Range Status   Specimen Description   Final    BLOOD LEFT ARM Performed at [facility], [address] [place], [place] [address]    Special Requests   Final    BOTTLES DRAWN AEROBIC ONLY Blood Culture results may not be optimal due to an inadequate volume of blood received in culture bottles Performed at [facility], [address] [place], [place] [address]    Culture  Setup Time   Final    AEROBIC BOTTLE ONLY GRAM POSITIVE COCCI CRITICAL VALUE NOTED.  VALUE IS CONSISTENT WITH PREVIOUSLY REPORTED AND CALLED VALUE.    Culture   Final    NO GROWTH 1 DAY Performed at [facility], [address] [place], [place] [address]    Report Status PENDING  Incomplete     Medications:   . albuterol  2.5 mg Nebulization Q6H  . chlorhexidine  15 mL Mouth Rinse BID  . Chlorhexidine Gluconate Cloth  6 each Topical Q0600  . glycopyrrolate  0.2 mg Intravenous Q4H  . heparin  5,000 Units Subcutaneous Q8H  . insulin aspart  0-5 Units Subcutaneous QHS  . insulin aspart  0-9 Units Subcutaneous TID WC  . mouth rinse  15 mL Mouth Rinse q12n4p  . mometasone-formoterol  2 puff Inhalation BID  . mupirocin ointment  1 application Nasal BID  . tiotropium  18 mcg Inhalation Q24H  . vancomycin variable dose per unstable renal function  (pharmacist dosing)   Does not apply See admin instructions   Continuous Infusions: . dextrose 125 mL/hr at 01/19/18 1921  . famotidine (PEPCID) IV Stopped (01/20/18 0021)  . metronidazole 500 mg (01/20/18 0610)      LOS: 3 days   Marinda ElkAbraham Feliz Ortiz  [facility] Pager [phone]  *Please refer to [email or web address], password [email or web address] to get updated schedule on who will round on this patient, as hospitalists switch teams weekly. If 7PM-7AM, please contact night-coverage at [email or web address], password [email or web address] for any overnight needs.  01/20/2018, 7:05 AM

## 2018-01-21 DIAGNOSIS — K7200 Acute and subacute hepatic failure without coma: Secondary | ICD-10-CM

## 2018-01-21 DIAGNOSIS — L89623 Pressure ulcer of left heel, stage 3: Secondary | ICD-10-CM

## 2018-01-21 DIAGNOSIS — L89613 Pressure ulcer of right heel, stage 3: Secondary | ICD-10-CM

## 2018-01-21 DIAGNOSIS — N170 Acute kidney failure with tubular necrosis: Secondary | ICD-10-CM

## 2018-01-21 LAB — CULTURE, BLOOD (ROUTINE X 2)

## 2018-01-21 LAB — GLUCOSE, CAPILLARY
GLUCOSE-CAPILLARY: 117 mg/dL — AB (ref 70–99)
GLUCOSE-CAPILLARY: 99 mg/dL (ref 70–99)
Glucose-Capillary: 117 mg/dL — ABNORMAL HIGH (ref 70–99)
Glucose-Capillary: 98 mg/dL (ref 70–99)

## 2018-01-21 LAB — BASIC METABOLIC PANEL
ANION GAP: 18 — AB (ref 5–15)
BUN: 70 mg/dL — ABNORMAL HIGH (ref 8–23)
CALCIUM: 8.7 mg/dL — AB (ref 8.9–10.3)
CO2: 14 mmol/L — ABNORMAL LOW (ref 22–32)
Chloride: 121 mmol/L — ABNORMAL HIGH (ref 98–111)
Creatinine, Ser: 4.54 mg/dL — ABNORMAL HIGH (ref 0.61–1.24)
GFR, EST AFRICAN AMERICAN: 12 mL/min — AB (ref >60)
GFR, EST NON AFRICAN AMERICAN: 10 mL/min — AB (ref >60)
Glucose, Bld: 127 mg/dL — ABNORMAL HIGH (ref 70–99)
Potassium: 4.7 mmol/L (ref 3.5–5.1)
Sodium: 153 mmol/L — ABNORMAL HIGH (ref 135–145)

## 2018-01-21 MED ORDER — SODIUM CHLORIDE 0.9 % IV SOLN
INTRAVENOUS | Status: DC
  Administered 2018-01-21 – 2018-01-22 (×3): via INTRAVENOUS

## 2018-01-21 MED ORDER — FUROSEMIDE 10 MG/ML IJ SOLN
80.0000 mg | Freq: Once | INTRAMUSCULAR | Status: AC
  Administered 2018-01-21: 80 mg via INTRAVENOUS
  Filled 2018-01-21: qty 8

## 2018-01-21 MED ORDER — FAMOTIDINE IN NACL 20-0.9 MG/50ML-% IV SOLN
20.0000 mg | INTRAVENOUS | Status: DC
  Administered 2018-01-21 – 2018-01-22 (×2): 20 mg via INTRAVENOUS
  Filled 2018-01-21 (×2): qty 50

## 2018-01-21 MED ORDER — SODIUM CHLORIDE 0.9 % IV BOLUS
1000.0000 mL | Freq: Once | INTRAVENOUS | Status: AC
  Administered 2018-01-21: 1000 mL via INTRAVENOUS

## 2018-01-21 MED ORDER — METRONIDAZOLE IN NACL 5-0.79 MG/ML-% IV SOLN: 500.0000 mg | Freq: Two times a day (BID) | INTRAVENOUS | Status: DC

## 2018-01-21 NOTE — Progress Notes (Addendum)
[facility] PROGRESS NOTE    Progress Note  [name] Bertone  HYI:[id number] DOB: [birth year] DOA: 01/18/2018 PCP: Hennie Duos, MD     Brief Narrative:   Greg Jones is an 84 y.o. male past medical history of chronic kidney disease, COPD hypertension presented with sepsis and abdominal pain in the ED a CT scan was done that showed evidence of proctitis  Assessment/Plan:   Sepsis due MRSA bacteremia possibly proctitis Started empirically on sepsis pathway on IV antibiotics and IV fluids. PCR ID reflex panel show MRSA. Repeated blood cultures on 01/19/1999 19 can remain positive. Hospice and palliative care met with family and family does not feel comfortable withdrawing care, she is clear that she does not want him to suffer she would like continue to continue current therapy but would like not to escalate care.  Acute kidney injury on chronic kidney disease stage III: Now possibly ATN as his creatinine is not improving he is not a dialysis candidate Baseline creatinine around 2. Is positive about 7 L he has not responded to IV fluid hydration. I am afraid that the options are limited he is not a dialysis candidate and his renal insufficiency is not improving with aggressive treatment.   We will give him IV Lasix to see if there is some improvement. Renal ultrasound showed chronic medical renal disease  Hypovolemic hypernatremia: Improving despite IV fluid hydration, will give him a single dose of IV Lasix and see the response.  Transaminitis/Shock liver:  The setting of dehydration and sepsis, improving.  Acute toxic encephalopathy: Likely due to sepsis and probably hypernatremia As per family member he is a DNR/DNI. Patient condition has been deteriorating over the last month. Hospice and palliative care to meet with family, they would like not to escalate treatment.  Rash: Diffuse desquamative rash seems to be like a drug reaction.  Pressure ulcer bilateral of  the heels: Wound care has been consulted to try to relieve pressure.  Diabetes mellitus type 2: Continue sliding scale insulin. Last A1c was 8 on 12/2017.  Chronic diastolic and systolic heart failure: Continue strict I's and O's he appears to be dry, making urine but poor urine output recorded. He is contracted cannot evaluate his fluid status will place a PICC line check CVP every 12's. Abdominal aortic aneurysm We will need to follow-up with vascular as an outpatient.  Essential hypertension Seems to be slightly high we will try to keep it on the high side due to his new acute renal failure, will use labetalol for systolic blood pressure greater than 190.  Unstageable pressure ulcer of left and right heels: WOC      DVT prophylaxis: lovenox Family Communication:Sister Disposition Plan/Barrier to D/C: unable to determine Code Status:     Code Status Orders  (From admission, onward)        Start     Ordered   01/24/2018 1717  Full code  Continuous     02/15/2018 1716    Code Status History    Date Active Date Inactive Code Status Order ID Comments User Context   11/12/2017 1510 11/17/2017 1842 Full Code [id number]  Edwin Dada, MD ED   10/18/2017 2315 10/25/2017 2105 Full Code [id number]  Karmen Bongo, MD Inpatient   08/14/2017 2209 08/22/2017 1627 Full Code [id number]  Norval Morton, MD ED   09/07/2016 1820 09/11/2016 1721 Full Code [id number]  Dixie Dials, MD Inpatient   04/11/2016 1850 04/12/2016 1950 Full Code [id number]  Florencia Reasons, MD Inpatient  03/15/2016 1839 03/18/2016 1539 Full Code [id number]  Dixie Dials, MD Inpatient   09/07/2015 1554 09/14/2015 1712 Full Code [id number]  Melvenia Needles, NP Inpatient        IV Access:    Peripheral IV   Procedures and diagnostic studies:   US Renal  Result Date: 01/20/2018 CLINICAL DATA:  Acute kidney injury. EXAM: RENAL / URINARY TRACT ULTRASOUND COMPLETE COMPARISON:  CT abdomen and pelvis 02/14/2018 FINDINGS: Right  Kidney: Length: 9.1 cm. Mild cortical thinning. Borderline to mildly increased parenchymal echogenicity. No mass or hydronephrosis visualized. Left Kidney: Length: 9.1 cm. Mild cortical thinning and increased parenchymal echogenicity. No hydronephrosis. 1.6 cm upper pole cyst. Bladder: Appears normal for degree of bladder distention. IMPRESSION: Findings of medical renal disease.  No hydronephrosis. Electronically Signed   By: Logan Bores M.D.   On: 01/20/2018 12:05     Medical Consultants:    None.  Anti-Infectives:   IV cefepime and flagyl  Subjective:    Authur Cubit patient continues to be nonverbal moaning and groaning.  Objective:    Vitals:   01/21/18 0000 01/21/18 0334 01/21/18 0400 01/21/18 0500  BP: (!) 167/142  (!) 100/28   Pulse: 99  100   Resp:      Temp:  98.5 F (36.9 C)    TempSrc:  Axillary    SpO2: 100%  100%   Weight:    70.2 kg (154 lb 12.2 oz)  Height:        Intake/Output Summary (Last 24 hours) at 01/21/2018 0729 Last data filed at 01/21/2018 0500 Gross per 24 hour  Intake 1698.6 ml  Output 300 ml  Net 1398.6 ml   Filed Weights   01/18/18 1019 01/20/18 0500 01/21/18 0500  Weight: 69.4 kg (153 lb) 70 kg (154 lb 5.2 oz) 70.2 kg (154 lb 12.2 oz)    Exam: General exam: In no acute distress. Respiratory system: Good air movement and clear to auscultation. Cardiovascular system: S1 & S2 heard, RRR. No JVD. Gastrointestinal system: Abdomen is nondistended, soft and tenderness. Central nervous system: Not alert or oriented.. Only responsive to pain. Extremities: No pedal edema. Skin: scaling desquamative.    Data Reviewed:    Labs: Basic Metabolic Panel: Recent Labs  Lab 02/07/2018 1734 01/18/18 0253 01/19/18 0445 01/20/18 0402 01/21/18 0412  NA 158* 158* 157* 153* 153*  K 5.5* 5.0 4.3 3.8 4.7  CL 120* 121* 125* 119* 121*  CO2 22 20* 17* 20* 14*  GLUCOSE 192* 128* 215* 150* 127*  BUN 80* 86* 86* 77* 70*  CREATININE 5.71* 5.55*  5.05* 4.53* 4.54*  CALCIUM 8.4* 8.3* 8.1* 8.3* 8.7*  MG  --  2.4  --   --   --    GFR Estimated Creatinine Clearance: 10.9 mL/min (A) (by C-G formula based on SCr of 4.54 mg/dL (H)). Liver Function Tests: Recent Labs  Lab 01/24/2018 1217 01/18/18 0253  AST 80* 74*  ALT 59* 47*  ALKPHOS 259* 184*  BILITOT 0.7 0.4  PROT 8.0 6.5  ALBUMIN 2.5* 2.1*   Recent Labs  Lab 01/27/2018 1217  LIPASE 21   No results for input(s): AMMONIA in the last 168 hours. Coagulation profile No results for input(s): INR, PROTIME in the last 168 hours.  CBC: Recent Labs  Lab 01/27/2018 1217 01/18/18 0253  WBC 14.9* 9.5  NEUTROABS 12.9*  --   HGB 12.8* 10.4*  HCT 41.2 33.1*  MCV 83.1 80.9  PLT 387 288   Cardiac Enzymes: No results  for input(s): CKTOTAL, CKMB, CKMBINDEX, TROPONINI in the last 168 hours. BNP (last 3 results) No results for input(s): PROBNP in the last 8760 hours. CBG: Recent Labs  Lab 01/19/18 2121 01/20/18 0804 01/20/18 1234 01/20/18 1614 01/20/18 2134  GLUCAP 96 125* 96 110* 128*   D-Dimer: No results for input(s): DDIMER in the last 72 hours. Hgb A1c: No results for input(s): HGBA1C in the last 72 hours. Lipid Profile: No results for input(s): CHOL, HDL, LDLCALC, TRIG, CHOLHDL, LDLDIRECT in the last 72 hours. Thyroid function studies: No results for input(s): TSH, T4TOTAL, T3FREE, THYROIDAB in the last 72 hours.  Invalid input(s): FREET3 Anemia work up: No results for input(s): VITAMINB12, FOLATE, FERRITIN, TIBC, IRON, RETICCTPCT in the last 72 hours. Sepsis Labs: Recent Labs  Lab 01/28/2018 1217 01/30/2018 1220 01/20/2018 1413 02/08/2018 1734 01/22/2018 2039 01/18/18 0253  WBC 14.9*  --   --   --   --  9.5  LATICACIDVEN  --  6.93* 5.43* 3.3* 3.8*  --    Microbiology Recent Results (from the past 240 hour(s))  Culture, blood (routine x 2)     Status: Abnormal   Collection Time: 02/09/2018 11:40 AM  Result Value Ref Range Status   Specimen Description   Final     BLOOD RIGHT ANTECUBITAL Performed at [facility] [address] [place], [place] [address]    Special Requests   Final    BOTTLES DRAWN AEROBIC AND ANAEROBIC Blood Culture adequate volume Performed at [facility] [address], [place], [place] [address]    Culture  Setup Time   Final    IN BOTH AEROBIC AND ANAEROBIC BOTTLES GRAM POSITIVE COCCI CRITICAL RESULT CALLED TO, READ BACK BY AND VERIFIED WITH: PHARMD J GADHIA 01/18/18 AT 823 AM BY CM Performed at [facility], [place] [address], [place], [place] [address]    Culture METHICILLIN RESISTANT STAPHYLOCOCCUS AUREUS (A)  Final   Report Status 01/20/2018 FINAL  Final   Organism ID, Bacteria METHICILLIN RESISTANT STAPHYLOCOCCUS AUREUS  Final      Susceptibility   Methicillin resistant staphylococcus aureus - MIC*    CIPROFLOXACIN >=8 RESISTANT Resistant     ERYTHROMYCIN >=8 RESISTANT Resistant     GENTAMICIN <=0.5 SENSITIVE Sensitive     OXACILLIN >=4 RESISTANT Resistant     TETRACYCLINE >=16 RESISTANT Resistant     VANCOMYCIN 1 SENSITIVE Sensitive     TRIMETH/SULFA >=320 RESISTANT Resistant     CLINDAMYCIN >=8 RESISTANT Resistant     RIFAMPIN <=0.5 SENSITIVE Sensitive     Inducible Clindamycin NEGATIVE Sensitive     * METHICILLIN RESISTANT STAPHYLOCOCCUS AUREUS  Culture, blood (routine x 2)     Status: Abnormal   Collection Time: 02/04/2018 11:40 AM  Result Value Ref Range Status   Specimen Description   Final    BLOOD BLOOD RIGHT FOREARM Performed at [facility] [address], [place], [place] [address]    Special Requests   Final    BOTTLES DRAWN AEROBIC ONLY Blood Culture adequate volume Performed at [facility] [address] [place], [place] [address]    Culture  Setup Time   Final    GRAM POSITIVE COCCI AEROBIC BOTTLE ONLY CRITICAL VALUE NOTED.  VALUE IS CONSISTENT WITH PREVIOUSLY REPORTED AND CALLED VALUE.    Culture (A)  Final      STAPHYLOCOCCUS AUREUS SUSCEPTIBILITIES PERFORMED ON PREVIOUS CULTURE WITHIN THE LAST 5 DAYS. Performed at [facility],  [address] [place], [place] [address]    Report Status 01/20/2018 FINAL  Final  Blood Culture ID Panel (Reflexed)     Status: Abnormal   Collection Time: 02/15/2018 11:40 AM  Result Value Ref Range Status   Enterococcus species NOT DETECTED NOT DETECTED Final   Listeria monocytogenes NOT DETECTED NOT DETECTED Final   Staphylococcus species DETECTED (A) NOT DETECTED Final    Comment: CRITICAL RESULT CALLED TO, READ BACK BY AND VERIFIED WITH: PHARMD J GADHIA 01/18/18 AT 825 AM BYCM    Staphylococcus aureus DETECTED (A) NOT DETECTED Final    Comment: Methicillin (oxacillin)-resistant Staphylococcus aureus (MRSA). MRSA is predictably resistant to beta-lactam antibiotics (except ceftaroline). Preferred therapy is vancomycin unless clinically contraindicated. Patient requires contact precautions if  hospitalized. CRITICAL RESULT CALLED TO, READ BACK BY AND VERIFIED WITH: PHARMD J GADHIA 01/18/18 AT 825 AM BY CM    Methicillin resistance DETECTED (A) NOT DETECTED Final    Comment: CRITICAL RESULT CALLED TO, READ BACK BY AND VERIFIED WITH: PHARMD J GADHIA 01/18/18 AT 825 BY CM    Streptococcus species NOT DETECTED NOT DETECTED Final   Streptococcus agalactiae NOT DETECTED NOT DETECTED Final   Streptococcus pneumoniae NOT DETECTED NOT DETECTED Final   Streptococcus pyogenes NOT DETECTED NOT DETECTED Final   Acinetobacter baumannii NOT DETECTED NOT DETECTED Final   Enterobacteriaceae species NOT DETECTED NOT DETECTED Final   Enterobacter cloacae complex NOT DETECTED NOT DETECTED Final   Escherichia coli NOT DETECTED NOT DETECTED Final   Klebsiella oxytoca NOT DETECTED NOT DETECTED Final   Klebsiella pneumoniae NOT DETECTED NOT DETECTED Final   Proteus species NOT DETECTED NOT DETECTED Final   Serratia marcescens NOT DETECTED NOT DETECTED Final   Haemophilus  influenzae NOT DETECTED NOT DETECTED Final   Neisseria meningitidis NOT DETECTED NOT DETECTED Final   Pseudomonas aeruginosa NOT DETECTED NOT DETECTED Final   Candida albicans NOT DETECTED NOT DETECTED Final   Candida glabrata NOT DETECTED NOT DETECTED Final   Candida krusei NOT DETECTED NOT DETECTED Final   Candida parapsilosis NOT DETECTED NOT DETECTED Final   Candida tropicalis NOT DETECTED NOT DETECTED Final    Comment: Performed at [facility], [place]. [address] [place], [place] [address]  Urine culture     Status: Abnormal   Collection Time: 02/10/2018  3:07 PM  Result Value Ref Range Status   Specimen Description   Final    URINE, RANDOM Performed at [facility] [address], [place], [place] [address]    Special Requests   Final    NONE Performed at [facility], [place] [address] [place], [place] [address]    Culture MULTIPLE SPECIES PRESENT, SUGGEST RECOLLECTION (A)  Final   Report Status 01/18/2018 FINAL  Final  MRSA PCR Screening     Status: Abnormal   Collection Time: 02/03/2018  6:45 PM  Result Value Ref Range Status   MRSA by PCR POSITIVE (A) NEGATIVE Final    Comment:        The GeneXpert MRSA Assay (FDA approved for NASAL specimens only), is one component of a comprehensive MRSA colonization surveillance program. It is not intended to diagnose MRSA infection nor to guide or monitor treatment for MRSA infections. RESULT CALLED TO, READ BACK BY AND VERIFIED WITH: MCMILLIAN,S AT 2005 ON 02/05/2018 BY MOSLEY,J Performed at [facility], [place] [address] [place],  [address]   Culture, blood (Routine X 2) w Reflex to ID Panel  Status: Abnormal (Preliminary result)   Collection Time: 01/18/18  9:48 AM  Result Value Ref Range Status   Specimen Description   Final    BLOOD LEFT HAND Performed at [facility] [address] [place], [place] [address]    Special Requests    Final    BOTTLES DRAWN AEROBIC ONLY Blood Culture results may not be optimal due to an inadequate volume of blood received in culture bottles Performed at [facility] [address] [place], [place] [address]    Culture  Setup Time   Final    AEROBIC BOTTLE ONLY GRAM POSITIVE COCCI CRITICAL VALUE NOTED.  VALUE IS CONSISTENT WITH PREVIOUSLY REPORTED AND CALLED VALUE. Performed at [facility], [place] [address] [place], [place] [address]    Culture STAPHYLOCOCCUS AUREUS (A)  Final   Report Status PENDING  Incomplete  Culture, blood (Routine X 2) w Reflex to ID Panel     Status: Abnormal (Preliminary result)   Collection Time: 01/18/18  9:58 AM  Result Value Ref Range Status   Specimen Description   Final    BLOOD LEFT ARM Performed at [facility] [address], [place], [place] [address]    Special Requests   Final    BOTTLES DRAWN AEROBIC ONLY Blood Culture results may not be optimal due to an inadequate volume of blood received in culture bottles Performed at [facility] [address], [place], [place] [address]    Culture  Setup Time   Final    AEROBIC BOTTLE ONLY GRAM POSITIVE COCCI CRITICAL VALUE NOTED.  VALUE IS CONSISTENT WITH PREVIOUSLY REPORTED AND CALLED VALUE. Performed at [facility], [place] [address] [place], [place] [address]    Culture STAPHYLOCOCCUS AUREUS (A)  Final   Report Status PENDING  Incomplete  Culture, blood (Routine X 2) w Reflex to ID Panel     Status: None (Preliminary result)   Collection Time: 01/19/18  8:25 AM  Result Value Ref Range Status   Specimen Description BLOOD BLOOD RIGHT HAND  Final   Special Requests   Final    BOTTLES DRAWN AEROBIC ONLY Blood Culture results may not be optimal due to an inadequate volume of blood received in culture bottles   Culture   Final    NO GROWTH 1 DAY Performed at [facility], [place] [address], [place], [place] [address]    Report  Status PENDING  Incomplete  Culture, blood (Routine X 2) w Reflex to ID Panel     Status: None (Preliminary result)   Collection Time: 01/19/18  8:26 AM  Result Value Ref Range Status   Specimen Description   Final    BLOOD LEFT HAND Performed at [facility] [address] [place], [place] [address]    Special Requests   Final    BOTTLES DRAWN AEROBIC ONLY Blood Culture adequate volume Performed at [facility] [address], [place], [place] [address]    Culture   Final    NO GROWTH 1 DAY Performed at [facility], [place] [address], [place],  [address]    Report Status PENDING  Incomplete     Medications:   . albuterol  2.5 mg Nebulization Q6H  . chlorhexidine  15 mL Mouth Rinse BID  . Chlorhexidine Gluconate Cloth  6 each Topical Q0600  . glycopyrrolate  0.2 mg Intravenous Q4H  . heparin  5,000  Units Subcutaneous Q8H  . insulin aspart  0-5 Units Subcutaneous QHS  . insulin aspart  0-9 Units Subcutaneous TID WC  . mouth rinse  15 mL Mouth Rinse q12n4p  . mometasone-formoterol  2 puff Inhalation BID  . mupirocin ointment  1 application Nasal BID  . tiotropium  18 mcg Inhalation Q24H   Continuous Infusions: . dextrose 50 mL/hr at 01/21/18 0500  . famotidine (PEPCID) IV Stopped (01/20/18 2227)  . metronidazole Stopped (01/21/18 1438)  . vancomycin Stopped (01/20/18 2254)      LOS: 4 days   [facility] Pager [phone]  *Please refer to [email or web address], password [email or web address] to get updated schedule on who will round on this patient, as hospitalists switch teams weekly. If 7PM-7AM, please contact night-coverage at [email or web address], password [email or web address] for any overnight needs.  01/21/2018, 7:29 AM

## 2018-01-21 NOTE — Progress Notes (Signed)
RN tried to call report to receiving 5E RN at [phone]. Receiving RN currently busy with pt, will call back.

## 2018-01-21 NOTE — Progress Notes (Signed)
PHARMACY NOTE:  ANTIMICROBIAL RENAL DOSAGE ADJUSTMENT  Current antimicrobial regimen includes a mismatch between antimicrobial dosage and estimated renal function. As per policy approved by the Pharmacy & Therapeutics and Medical Executive Committees, the antimicrobial dosage will be adjusted accordingly.  Current antimicrobial and dosage:  Metronidazole 500 mg q8 hr  Indication: IAI, proctitis  Renal Function:   Estimated Creatinine Clearance: 10.9 mL/min (A) (by C-G formula based on SCr of 4.54 mg/dL (H)). []      On intermittent HD, scheduled: []      On CRRT    Antimicrobial dosage has been changed to:  500 mg q12 hr   Additional Comments: n/a   Thank you for allowing pharmacy to be a part of this patient's care.  Bernadene Personrew Shronda Boeh, PharmD, BCPS [phone] 01/21/2018, 12:24 PM

## 2018-01-21 NOTE — Progress Notes (Signed)
SLP Cancellation Note  Patient Details Name: Greg RobertsJonathan Jones MRN: [id number] DOB: 11/12/1927   Cancelled treatment:       Reason Eval/Treat Not Completed: Fatigue/lethargy limiting ability to participate;Medical issues which prohibited therapy;Other (nursing deferred; stated "pt doesn't wake up")   Tressie StalkerPat Adams, M.S., CCC-SLP 01/21/2018, 9:24 AM

## 2018-01-21 NOTE — Progress Notes (Signed)
                                                                                                                                                                                                         Daily Progress Note   Patient Name: Greg Jones       Date: 01/21/2018 DOB: 04/18/1928  Age: 84 y.o. MRN#: [id number] Attending Physician: Feliz Ortiz, Abraham, MD Primary Care Physician: Alexander, Anne D, MD Admit Date: 01/25/2018  Reason for Consultation/Follow-up: Establishing goals of care  Subjective: Greg Jones remains unable to communicate.  Mildly restless with transitioning to new floor.  No family present at bedside.  See below:  Length of Stay: 4  Current Medications: Scheduled Meds:  . albuterol  2.5 mg Nebulization Q6H  . chlorhexidine  15 mL Mouth Rinse BID  . Chlorhexidine Gluconate Cloth  6 each Topical Q0600  . glycopyrrolate  0.2 mg Intravenous Q4H  . heparin  5,000 Units Subcutaneous Q8H  . insulin aspart  0-5 Units Subcutaneous QHS  . insulin aspart  0-9 Units Subcutaneous TID WC  . mouth rinse  15 mL Mouth Rinse q12n4p  . mometasone-formoterol  2 puff Inhalation BID  . mupirocin ointment  1 application Nasal BID  . tiotropium  18 mcg Inhalation Q24H    Continuous Infusions: . dextrose 10 mL/hr at 01/21/18 0836  . famotidine (PEPCID) IV Stopped (01/21/18 1047)  . metronidazole Stopped (01/21/18 0653)  . vancomycin Stopped (01/20/18 2254)    PRN Meds: albuterol, fentaNYL (SUBLIMAZE) injection, labetalol, ondansetron **OR** ondansetron (ZOFRAN) IV  Physical Exam         General: Intermittent moaning.  HEENT: No bruits, no goiter, no JVD Heart: Regular rate and rhythm. No murmur appreciated. Lungs: Diminished air movement, rhochi Abdomen: Soft, mild tender, distended, positive bowel sounds.  Ext: No significant edema Skin: Warm and dry. Exfoliating skin rash.  Vital Signs: BP 100/66 (BP Location: Right Arm)   Pulse 97   Temp 97.7 F (36.5 C)  (Axillary)   Resp 18   Ht 5' 8" (1.727 m)   Wt 70.2 kg (154 lb 12.2 oz)   SpO2 97%   BMI 23.53 kg/m  SpO2: SpO2: 97 % O2 Device: O2 Device: Room Air O2 Flow Rate: O2 Flow Rate (L/min): 2 L/min  Intake/output summary:   Intake/Output Summary (Last 24 hours) at 01/21/2018 1111 Last data filed at 01/21/2018 1000 Gross per 24 hour  Intake 1798.6 ml  Output 300 ml  Net 1498.6 ml   LBM: Last BM Date: 01/20/18 Baseline Weight: Weight:   69.4 kg (153 lb) Most recent weight: Weight: 70.2 kg (154 lb 12.2 oz)       Palliative Assessment/Data:    Flowsheet Rows     Most Recent Value  Intake Tab  Referral Department  Hospitalist  Unit at Time of Referral  ICU  Palliative Care Primary Diagnosis  Sepsis/Infectious Disease  Date Notified  01/18/18  Palliative Care Type  New Palliative care  Reason for referral  Clarify Goals of Care  Date of Admission  02/13/2018  Date first seen by Palliative Care  01/18/18  # of days Palliative referral response time  0 Day(s)  # of days IP prior to Palliative referral  1  Clinical Assessment  Palliative Performance Scale Score  10%  Psychosocial & Spiritual Assessment  Palliative Care Outcomes      Patient Active Problem List   Diagnosis Date Noted  . Shock liver 01/21/2018  . Acute renal failure superimposed on stage 4 chronic kidney disease ([place]) 01/20/2018  . Acute metabolic encephalopathy   . Abdominal aortic aneurysm (AAA) without rupture ([place])   . MRSA bacteremia   . Proctitis 01/18/2018  . Hypernatremia 01/18/2018  . AKI (acute kidney injury) ([place]) 01/18/2018  . Hyperkalemia 01/18/2018  . Transaminitis 01/18/2018  . Toxic encephalopathy 01/18/2018  . Gout 01/18/2018  . Sepsis ([place]) 01/29/2018  . Acute renal failure ([place])   . Poor appetite 01/02/2018  . Acute on chronic respiratory failure with hypoxemia ([place]) 11/22/2017  . HCAP (healthcare-associated pneumonia) 11/22/2017  . Aspiration pneumonia ([place]) 11/22/2017  . Rash and  nonspecific skin eruption 11/22/2017  . COPD with acute exacerbation ([place]) 10/18/2017  . Type 2 diabetes mellitus with other specified complication ([place]) 49/44/9675  . Pruritic rash 08/14/2017  . Pain and swelling of toe of left foot 08/14/2017  . Stage I pressure ulcer 04/12/2016  . CKD (chronic kidney disease), stage IV ([place]) 04/11/2016  . QT prolongation 04/11/2016  . Hypokalemia 04/11/2016  . Chronic combined systolic and diastolic CHF (congestive heart failure) ([place]) 01/11/2016  . GERD (gastroesophageal reflux disease) 05/28/2015  . COPD (chronic obstructive pulmonary disease) ([place]) 05/10/2015  . Essential hypertension 05/10/2015    Palliative Care Assessment & Plan   Patient Profile: 84 y.o. male  with past medical history of COPD, CKD, CHF, HTN long term care resident admitted on 02/11/2018 with altered mental status, hypernatremia, AKI, and MRSA bacteremia.   Assessment: Patient Active Problem List   Diagnosis Date Noted  . Shock liver 01/21/2018  . Acute renal failure superimposed on stage 4 chronic kidney disease ([place]) 01/20/2018  . Acute metabolic encephalopathy   . Abdominal aortic aneurysm (AAA) without rupture ([place])   . MRSA bacteremia   . Proctitis 01/18/2018  . Hypernatremia 01/18/2018  . AKI (acute kidney injury) ([place]) 01/18/2018  . Hyperkalemia 01/18/2018  . Transaminitis 01/18/2018  . Toxic encephalopathy 01/18/2018  . Gout 01/18/2018  . Sepsis ([place]) 01/22/2018  . Acute renal failure ([place])   . Poor appetite 01/02/2018  . Acute on chronic respiratory failure with hypoxemia ([place]) 11/22/2017  . HCAP (healthcare-associated pneumonia) 11/22/2017  . Aspiration pneumonia ([place]) 11/22/2017  . Rash and nonspecific skin eruption 11/22/2017  . COPD with acute exacerbation ([place]) 10/18/2017  . Type 2 diabetes mellitus with other specified complication ([place]) 91/63/8466  . Pruritic rash 08/14/2017  . Pain and swelling of toe of left foot 08/14/2017  . Stage I pressure  ulcer 04/12/2016  . CKD (chronic kidney disease), stage IV ([place]) 04/11/2016  . QT prolongation  04/11/2016  . Hypokalemia 04/11/2016  . Chronic combined systolic and diastolic CHF (congestive heart failure) ([place]) 01/11/2016  . GERD (gastroesophageal reflux disease) 05/28/2015  . COPD (chronic obstructive pulmonary disease) ([place]) 05/10/2015  . Essential hypertension 05/10/2015    Recommendations/Plan: - UOP decreased over last 24 hours.  No family present at bedside today.  Intermittently agitated/restless but this improves with fentanyl.    - Last met with sister on 8/5 who reported understanding his multiple medical problems and poor clinical status.   She would like to continue with current therapies but understands that this will likely be a terminal event.  She seems at peace that he may die, but is struggling with decision to transition to comfort focus only.  She is very clear at the same time that she does not want him to suffer.  We discussed plan to continue current therapies, no escalation of care in the event of decompensation, and use medication for pain and SOB regardless of potential side effects including hypotension and respiratory depression if needed to ensure he is comfortable.  Continue fentanyl 50-163mg Q 1 hour as needed.  Goals of Care and Additional Recommendations:  Limitations on Scope of Treatment: No escalation of care/no aggressive interventions  Code Status:    Code Status Orders  (From admission, onward)        Start     Ordered   01/18/18 0721  Do not attempt resuscitation (DNR)  Continuous     01/18/18 0721    Code Status History    Date Active Date Inactive Code Status Order ID Comments User Context   01/28/2018 1717 01/18/2018 0721 Full Code [id number] PElodia [place], MD ED   11/12/2017 1510 11/17/2017 1842 Full Code [id number] DEdwin Dada MD ED   10/18/2017 2315 10/25/2017 2105 Full Code [id number] YKarmen Bongo MD Inpatient    08/14/2017 2209 08/22/2017 1627 Full Code [id number] SNorval Morton MD ED   09/07/2016 1820 09/11/2016 1721 Full Code [id number] KDixie Dials MD Inpatient   04/11/2016 1850 04/12/2016 1950 Full Code [id number] XFlorencia Reasons MD Inpatient   03/15/2016 1839 03/18/2016 1539 Full Code [id number] KDixie Dials MD Inpatient   09/07/2015 1554 09/14/2015 1712 Full Code [id number] [place], TFonnie Mu NP Inpatient       Prognosis:   Poor  Discharge Planning:  To Be Determined- I believe this is very likely to be a terminal admission  Care plan was discussed with RN, Dr. FVenetia Constable Thank you for allowing the Palliative Medicine Team to assist in the care of this patient.   Time In: 1050 Time Out: 1110 Total Time 20 Prolonged Time Billed No      Greater than 50%  of this time was spent counseling and coordinating care related to the above assessment and plan.  GMicheline Rough MD  Please contact Palliative Medicine Team phone at [phone] questions and concerns.

## 2018-01-21 NOTE — Progress Notes (Signed)
Subjective: Pt unresponsive   Antibiotics:  Anti-infectives (From admission, onward)   Start     Dose/Rate Route Frequency Ordered Stop   01/21/18 1800  metroNIDAZOLE (FLAGYL) IVPB 500 mg  Status:  Discontinued     500 mg 100 mL/hr over 60 Minutes Intravenous Every 12 hours 01/21/18 1223 01/21/18 1555   01/20/18 2100  vancomycin (VANCOCIN) 500 mg in sodium chloride 0.9 % 100 mL IVPB     500 mg 100 mL/hr over 60 Minutes Intravenous Every 36 hours 01/20/18 1006     01/19/18 1000  vancomycin (VANCOCIN) IVPB 1000 mg/200 mL premix     1,000 mg 200 mL/hr over 60 Minutes Intravenous  Once 01/19/18 0745 01/19/18 1033   01/19/18 0740  vancomycin variable dose per unstable renal function (pharmacist dosing)  Status:  Discontinued      Does not apply See admin instructions 01/19/18 0740 01/20/18 1006   01/18/18 2200  ceFEPIme (MAXIPIME) 500 mg in dextrose 5 % 50 mL IVPB  Status:  Discontinued     500 mg 100 mL/hr over 30 Minutes Intravenous Every 24 hours 12/03/17 1727 01/19/18 0653   01/18/18 1000  vancomycin (VANCOCIN) 500 mg in sodium chloride 0.9 % 100 mL IVPB     500 mg 100 mL/hr over 60 Minutes Intravenous  Once 01/18/18 0918 01/18/18 1304   12/03/17 2200  metroNIDAZOLE (FLAGYL) IVPB 500 mg  Status:  Discontinued     500 mg 100 mL/hr over 60 Minutes Intravenous Every 8 hours 12/03/17 1716 01/21/18 1223   12/03/17 1415  ceFEPIme (MAXIPIME) 2 g in sodium chloride 0.9 % 100 mL IVPB     2 g 200 mL/hr over 30 Minutes Intravenous  Once 12/03/17 1406 12/03/17 1445   12/03/17 1415  metroNIDAZOLE (FLAGYL) IVPB 500 mg     500 mg 100 mL/hr over 60 Minutes Intravenous  Once 12/03/17 1406 12/03/17 1458      Medications: Scheduled Meds: . albuterol  2.5 mg Nebulization Q6H  . chlorhexidine  15 mL Mouth Rinse BID  . Chlorhexidine Gluconate Cloth  6 each Topical Q0600  . glycopyrrolate  0.2 mg Intravenous Q4H  . heparin  5,000 Units Subcutaneous Q8H  . insulin aspart  0-5 Units  Subcutaneous QHS  . insulin aspart  0-9 Units Subcutaneous TID WC  . mouth rinse  15 mL Mouth Rinse q12n4p  . mometasone-formoterol  2 puff Inhalation BID  . mupirocin ointment  1 application Nasal BID  . tiotropium  18 mcg Inhalation Q24H   Continuous Infusions: . sodium chloride Stopped (01/21/18 1406)  . dextrose Stopped (01/21/18 1404)  . famotidine (PEPCID) IV Stopped (01/21/18 1025)  . vancomycin Stopped (01/20/18 2254)   PRN Meds:.albuterol, fentaNYL (SUBLIMAZE) injection, labetalol, ondansetron **OR** ondansetron (ZOFRAN) IV    Objective: Weight change: 7.1 oz (0.2 kg)  Intake/Output Summary (Last 24 hours) at 01/21/2018 1654 Last data filed at 01/21/2018 1500 Gross per 24 hour  Intake 2539.04 ml  Output 300 ml  Net 2239.04 ml   Blood pressure 107/66, pulse 96, temperature (!) 96.4 F (35.8 C), temperature source Axillary, resp. rate 18, height 5\' 8"  (1.727 m), weight 154 lb 12.2 oz (70.2 kg), SpO2 97 %. Temp:  [96.4 F (35.8 C)-98.5 F (36.9 C)] 96.4 F (35.8 C) (08/05 1352) Pulse Rate:  [93-100] 96 (08/05 1352) Resp:  [18] 18 (08/05 1103) BP: (73-167)/(28-142) 107/66 (08/05 1532) SpO2:  [97 %-100 %] 97 % (08/05 1450) Weight:  [154 lb  12.2 oz (70.2 kg)] 154 lb 12.2 oz (70.2 kg) (08/05 0500)  Physical Exam: General: obtunded, rhonchorous airway sounds HEENT:[place]/AT [facility] regular rate, normal  No mgr Chest: , rhonchorous airway sounds Abdomen: soft non-distended,  Extremities/Skin:  01/19/18:      Neuro: nonfocal  CBC:    BMET Recent Labs    01/20/18 0402 01/21/18 0412  NA 153* 153*  K 3.8 4.7  CL 119* 121*  CO2 20* 14*  GLUCOSE 150* 127*  BUN 77* 70*  CREATININE 4.53* 4.54*  CALCIUM 8.3* 8.7*     Liver Panel  No results for input(s): PROT, ALBUMIN, AST, ALT, ALKPHOS, BILITOT, BILIDIR, IBILI in the last 72 hours.     Sedimentation Rate No results for input(s): ESRSEDRATE in the last 72 hours. C-Reactive Protein No results for input(s):  CRP in the last 72 hours.  Micro Results: Recent Results (from the past 720 hour(s))  Culture, blood (routine x 2)     Status: Abnormal   Collection Time: 2018-02-15 11:40 AM  Result Value Ref Range Status   Specimen Description   Final    BLOOD RIGHT ANTECUBITAL Performed at [facility], [address] [place], [place] [address]    Special Requests   Final    BOTTLES DRAWN AEROBIC AND ANAEROBIC Blood Culture adequate volume Performed at [facility], [address], [place], [place] [address]    Culture  Setup Time   Final    IN BOTH AEROBIC AND ANAEROBIC BOTTLES GRAM POSITIVE COCCI CRITICAL RESULT CALLED TO, READ BACK BY AND VERIFIED WITH: PHARMD J GADHIA 01/18/18 AT 823 AM BY CM Performed at [facility], [address] [place], [place] [address]    Culture METHICILLIN RESISTANT STAPHYLOCOCCUS AUREUS (A)  Final   Report Status 01/20/2018 FINAL  Final   Organism ID, Bacteria METHICILLIN RESISTANT STAPHYLOCOCCUS AUREUS  Final      Susceptibility   Methicillin resistant staphylococcus aureus - MIC*    CIPROFLOXACIN >=8 RESISTANT Resistant     ERYTHROMYCIN >=8 RESISTANT Resistant     GENTAMICIN <=0.5 SENSITIVE Sensitive     OXACILLIN >=4 RESISTANT Resistant     TETRACYCLINE >=16 RESISTANT Resistant     VANCOMYCIN 1 SENSITIVE Sensitive     TRIMETH/SULFA >=320 RESISTANT Resistant     CLINDAMYCIN >=8 RESISTANT Resistant     RIFAMPIN <=0.5 SENSITIVE Sensitive     Inducible Clindamycin NEGATIVE Sensitive     * METHICILLIN RESISTANT STAPHYLOCOCCUS AUREUS  Culture, blood (routine x 2)     Status: Abnormal   Collection Time: 15-Feb-2018 11:40 AM  Result Value Ref Range Status   Specimen Description   Final    BLOOD BLOOD RIGHT FOREARM Performed at [facility], [address] [place], [place] [address]    Special Requests   Final    BOTTLES DRAWN AEROBIC ONLY Blood Culture adequate volume Performed at [facility], [address] [place], [place] [address]    Culture  Setup Time   Final    GRAM POSITIVE COCCI AEROBIC BOTTLE ONLY CRITICAL VALUE NOTED.  VALUE IS CONSISTENT WITH PREVIOUSLY REPORTED AND CALLED VALUE.    Culture (A)  Final    STAPHYLOCOCCUS AUREUS SUSCEPTIBILITIES PERFORMED ON PREVIOUS CULTURE WITHIN THE LAST 5 DAYS. Performed at [facility], [address] [place], [place] [address]    Report Status 01/20/2018 FINAL  Final  Blood Culture ID Panel (Reflexed)     Status: Abnormal  Collection Time: 02/10/2018 11:40 AM  Result Value Ref Range Status   Enterococcus species NOT DETECTED NOT DETECTED Final   Listeria monocytogenes NOT DETECTED NOT DETECTED Final   Staphylococcus species DETECTED (A) NOT DETECTED Final    Comment: CRITICAL RESULT CALLED TO, READ BACK BY AND VERIFIED WITH: PHARMD J GADHIA 01/18/18 AT 825 AM BYCM    Staphylococcus aureus DETECTED (A) NOT DETECTED Final    Comment: Methicillin (oxacillin)-resistant Staphylococcus aureus (MRSA). MRSA is predictably resistant to beta-lactam antibiotics (except ceftaroline). Preferred therapy is vancomycin unless clinically contraindicated. Patient requires contact precautions if  hospitalized. CRITICAL RESULT CALLED TO, READ BACK BY AND VERIFIED WITH: PHARMD J GADHIA 01/18/18 AT 825 AM BY CM    Methicillin resistance DETECTED (A) NOT DETECTED Final    Comment: CRITICAL RESULT CALLED TO, READ BACK BY AND VERIFIED WITH: PHARMD J GADHIA 01/18/18 AT 825 BY CM    Streptococcus species NOT DETECTED NOT DETECTED Final   Streptococcus agalactiae NOT DETECTED NOT DETECTED Final   Streptococcus pneumoniae NOT DETECTED NOT DETECTED Final   Streptococcus pyogenes NOT DETECTED NOT DETECTED Final   Acinetobacter baumannii NOT DETECTED NOT DETECTED Final   Enterobacteriaceae species NOT DETECTED NOT DETECTED Final   Enterobacter cloacae complex NOT DETECTED NOT DETECTED Final   Escherichia coli NOT DETECTED NOT  DETECTED Final   Klebsiella oxytoca NOT DETECTED NOT DETECTED Final   Klebsiella pneumoniae NOT DETECTED NOT DETECTED Final   Proteus species NOT DETECTED NOT DETECTED Final   Serratia marcescens NOT DETECTED NOT DETECTED Final   Haemophilus influenzae NOT DETECTED NOT DETECTED Final   Neisseria meningitidis NOT DETECTED NOT DETECTED Final   Pseudomonas aeruginosa NOT DETECTED NOT DETECTED Final   Candida albicans NOT DETECTED NOT DETECTED Final   Candida glabrata NOT DETECTED NOT DETECTED Final   Candida krusei NOT DETECTED NOT DETECTED Final   Candida parapsilosis NOT DETECTED NOT DETECTED Final   Candida tropicalis NOT DETECTED NOT DETECTED Final    Comment: Performed at [facility], [address] [place], [place] [address]  Urine culture     Status: Abnormal   Collection Time: 01/19/2018  3:07 PM  Result Value Ref Range Status   Specimen Description   Final    URINE, RANDOM Performed at [facility], [address], [place], [place] [address]    Special Requests   Final    NONE Performed at [facility], [address], [place], [place] [address]    Culture MULTIPLE SPECIES PRESENT, SUGGEST RECOLLECTION (A)  Final   Report Status 01/18/2018 FINAL  Final  MRSA PCR Screening     Status: Abnormal   Collection Time: 02/11/2018  6:45 PM  Result Value Ref Range Status   MRSA by PCR POSITIVE (A) NEGATIVE Final    Comment:        The GeneXpert MRSA Assay (FDA approved for NASAL specimens only), is one component of a comprehensive MRSA colonization surveillance program. It is not intended to diagnose MRSA infection nor to guide or monitor treatment for MRSA infections. RESULT CALLED TO, READ BACK BY AND VERIFIED WITH: MCMILLIAN,S AT 2005 ON 02/02/2018 BY MOSLEY,J Performed at [facility], [address] [place], [place] [address]   Culture, blood (Routine X 2) w Reflex to ID Panel     Status: Abnormal   Collection  Time: 01/18/18  9:48 AM  Result Value Ref Range Status   Specimen Description   Final    BLOOD  LEFT HAND Performed at [facility], [address], [place], [place] [address]    Special Requests   Final    BOTTLES DRAWN AEROBIC ONLY Blood Culture results may not be optimal due to an inadequate volume of blood received in culture bottles Performed at [facility], [address] [place], [place] [address]    Culture  Setup Time   Final    AEROBIC BOTTLE ONLY GRAM POSITIVE COCCI CRITICAL VALUE NOTED.  VALUE IS CONSISTENT WITH PREVIOUSLY REPORTED AND CALLED VALUE.    Culture (A)  Final    STAPHYLOCOCCUS AUREUS SUSCEPTIBILITIES PERFORMED ON PREVIOUS CULTURE WITHIN THE LAST 5 DAYS. Performed at [facility], [address] [place], [place] [address]    Report Status 01/21/2018 FINAL  Final  Culture, blood (Routine X 2) w Reflex to ID Panel     Status: Abnormal   Collection Time: 01/18/18  9:58 AM  Result Value Ref Range Status   Specimen Description   Final    BLOOD LEFT ARM Performed at [facility], [address], [place], [place] [address]    Special Requests   Final    BOTTLES DRAWN AEROBIC ONLY Blood Culture results may not be optimal due to an inadequate volume of blood received in culture bottles Performed at [facility], [address] [place], [place] [address]    Culture  Setup Time   Final    AEROBIC BOTTLE ONLY GRAM POSITIVE COCCI CRITICAL VALUE NOTED.  VALUE IS CONSISTENT WITH PREVIOUSLY REPORTED AND CALLED VALUE.    Culture (A)  Final    STAPHYLOCOCCUS AUREUS SUSCEPTIBILITIES PERFORMED ON PREVIOUS CULTURE WITHIN THE LAST 5 DAYS. Performed at [facility], [address], [place], [place] [address]    Report Status 01/21/2018 FINAL  Final  Culture, blood (Routine X 2) w Reflex to ID Panel     Status: None (Preliminary result)   Collection Time: 01/19/18  8:25 AM  Result  Value Ref Range Status   Specimen Description BLOOD BLOOD RIGHT HAND  Final   Special Requests   Final    BOTTLES DRAWN AEROBIC ONLY Blood Culture results may not be optimal due to an inadequate volume of blood received in culture bottles   Culture   Final    NO GROWTH 2 DAYS Performed at [facility], [address], [place], [place] [address]    Report Status PENDING  Incomplete  Culture, blood (Routine X 2) w Reflex to ID Panel     Status: None (Preliminary result)   Collection Time: 01/19/18  8:26 AM  Result Value Ref Range Status   Specimen Description   Final    BLOOD LEFT HAND Performed at [facility], [address] [place], [place] [address]    Special Requests   Final    BOTTLES DRAWN AEROBIC ONLY Blood Culture adequate volume Performed at [facility], [address], [place], [place] [address]    Culture   Final    NO GROWTH 2 DAYS Performed at [facility], [address], [place], [place] [address]    Report Status PENDING  Incomplete    Studies/Results: US Renal  Result Date: 01/20/2018 CLINICAL DATA:  Acute kidney injury. EXAM: RENAL / URINARY TRACT ULTRASOUND COMPLETE COMPARISON:  CT abdomen and pelvis Jan 24, 2018 FINDINGS: Right Kidney: Length: 9.1 cm. Mild cortical thinning. Borderline to mildly increased parenchymal echogenicity. No mass or  hydronephrosis visualized. Left Kidney: Length: 9.1 cm. Mild cortical thinning and increased parenchymal echogenicity. No hydronephrosis. 1.6 cm upper pole cyst. Bladder: Appears normal for degree of bladder distention. IMPRESSION: Findings of medical renal disease.  No hydronephrosis. Electronically Signed   By: Sebastian Ache M.D.   On: 01/20/2018 12:05      Assessment/Plan:  INTERVAL HISTORY: pts repeat blood cultures  01/19/18 no growth x 2 days  Active Problems:   Essential hypertension   Chronic combined systolic and diastolic CHF (congestive heart failure) (HCC)    CKD (chronic kidney disease), stage IV (HCC)   Stage I pressure ulcer   Type 2 diabetes mellitus with other specified complication (HCC)   Rash and nonspecific skin eruption   Sepsis (HCC)   Proctitis   Hypernatremia   AKI (acute kidney injury) (HCC)   Hyperkalemia   Transaminitis   Toxic encephalopathy   Gout   Abdominal aortic aneurysm (AAA) without rupture (HCC)   MRSA bacteremia   Acute metabolic encephalopathy   Shock liver   Pressure injury of left heel, stage 3 (HCC)   Pressure injury of right heel, stage 3 (HCC)    Greg Jones is a 84 y.o. male with  Severe COPD, CKD3 with chronic rash admitted with obtundation and septis due to MRSA bacteremia. He was also being treated for proctitis based on imaging  #1 MRSA bacteremia:  His prognosis appears grim  For completeness sake SAB checklist follows       [facility] Staphylococcus aureus bacteremia   Staphylococcus aureus bacteremia (SAB) is associated with a high rate of complications and mortality.  Specific aspects of clinical management are critical to optimizing the outcome of patients with SAB.  Therefore, the [facility] Antimicrobial Management Team [facility]) has initiated an intervention aimed at improving the management of SAB at [facility].  To do so, Infectious Diseases physicians are providing an evidence-based consult for the management of all patients with SAB.     Yes No Comments  Perform follow-up blood cultures (even if the patient is afebrile) to ensure clearance of bacteremia [x]  []   yesterday's cultures are without growth so far  Remove vascular catheter and obtain follow-up blood cultures after the removal of the catheter [x]  []    Perform echocardiography to evaluate for endocarditis (transthoracic ECHO is 40-50% sensitive, TEE is > 90% sensitive) [x]  []  Please keep in mind, that neither test can definitively EXCLUDE endocarditis, and that should clinical suspicion remain  high for endocarditis the patient should then still be treated with an "endocarditis" duration of therapy = 6 weeks  TEE too dangerous  Consult electrophysiologist to evaluate implanted cardiac device (pacemaker, ICD) []  []    Ensure source control []  []  Have all abscesses been drained effectively? Have deep seeded infections (septic joints or osteomyelitis) had appropriate surgical debridement? Skin possibly  Investigate for "metastatic" sites of infection []  []  Does the patient have ANY symptom or physical exam finding that would suggest a deeper infection (back or neck pain that may be suggestive of vertebral osteomyelitis or epidural abscess, muscle pain that could be a symptom of pyomyositis)?  Keep in mind that for deep seeded infections MRI imaging with contrast is preferred rather than other often insensitive tests such as plain x-rays, especially early in a patient's presentation.  Change antibiotic therapy to vancomycin [x]  []  Beta-lactam antibiotics are preferred for MSSA due to higher cure rates.   If on Vancomycin, goal trough should be 15 - 20 mcg/mL  Estimated duration of IV antibiotic therapy:  6 weeks if he survives []  []  Consult case management for probably prolonged outpatient IV antibiotic therapy   #3 Acute on CKD: would not advocate for HD  BMP Latest Ref Rng & Units 01/21/2018 01/20/2018 01/19/2018  Glucose 70 - 99 mg/dL 161(W) 960(A) 540(J)  BUN 8 - 23 mg/dL 81(X) 91(Y) 78(G)  Creatinine 0.61 - 1.24 mg/dL 9.56(O) 1.30(Q) 6.57(Q)  Sodium 135 - 145 mmol/L 153(H) 153(H) 157(H)  Potassium 3.5 - 5.1 mmol/L 4.7 3.8 4.3  Chloride 98 - 111 mmol/L 121(H) 119(H) 125(H)  CO2 22 - 32 mmol/L 14(L) 20(L) 17(L)  Calcium 8.9 - 10.3 mg/dL 4.6(N) 8.3(L) 8.1(L)     #4 Goals of care: I agree with Dr. Neale Burly that the patient is actively dying and I STRONGLY encourage shift to pure palliative care.  We will follow peripherally for now.  Dr. Orvan Falconer is available for questions tomorrow while  I am out of town and I will return on Wednesday.  LOS: 4 days   Acey Lav 01/21/2018, 4:54 PM

## 2018-01-22 LAB — BASIC METABOLIC PANEL
Anion gap: 15 (ref 5–15)
BUN: 69 mg/dL — ABNORMAL HIGH (ref 8–23)
CHLORIDE: 128 mmol/L — AB (ref 98–111)
CO2: 17 mmol/L — ABNORMAL LOW (ref 22–32)
Calcium: 8.1 mg/dL — ABNORMAL LOW (ref 8.9–10.3)
Creatinine, Ser: 4.2 mg/dL — ABNORMAL HIGH (ref 0.61–1.24)
GFR, EST AFRICAN AMERICAN: 13 mL/min — AB (ref >60)
GFR, EST NON AFRICAN AMERICAN: 11 mL/min — AB (ref >60)
Glucose, Bld: 73 mg/dL (ref 70–99)
POTASSIUM: 3.6 mmol/L (ref 3.5–5.1)
SODIUM: 160 mmol/L — AB (ref 135–145)

## 2018-01-22 LAB — GLUCOSE, CAPILLARY
GLUCOSE-CAPILLARY: 119 mg/dL — AB (ref 70–99)
Glucose-Capillary: 80 mg/dL (ref 70–99)
Glucose-Capillary: 79 mg/dL (ref 70–99)
Glucose-Capillary: 64 mg/dL — ABNORMAL LOW (ref 70–99)
Glucose-Capillary: 108 mg/dL — ABNORMAL HIGH (ref 70–99)

## 2018-01-22 MED ORDER — DEXTROSE 50 % IV SOLN
INTRAVENOUS | Status: AC
  Administered 2018-01-22: 25 mL
  Filled 2018-01-22: qty 50

## 2018-01-22 MED ORDER — IPRATROPIUM-ALBUTEROL 0.5-2.5 (3) MG/3ML IN SOLN
3.0000 mL | Freq: Three times a day (TID) | RESPIRATORY_TRACT | Status: DC
  Administered 2018-01-22 (×2): 3 mL via RESPIRATORY_TRACT
  Filled 2018-01-22 (×2): qty 3

## 2018-01-22 MED ORDER — POTASSIUM CHLORIDE 2 MEQ/ML IV SOLN
INTRAVENOUS | Status: DC
  Filled 2018-01-22: qty 1000

## 2018-01-22 MED ORDER — POTASSIUM CHLORIDE 2 MEQ/ML IV SOLN
INTRAVENOUS | Status: DC
  Administered 2018-01-22: 19:00:00 via INTRAVENOUS
  Filled 2018-01-22 (×3): qty 1000

## 2018-01-22 NOTE — Progress Notes (Signed)
Daily Progress Note   Patient Name: Greg Jones       Date: 01/22/2018 DOB: [birth year]  Age: 84 y.o. MRN#: [id number] Attending Physician: Marinda Elk, MD Primary Care Physician: Margit Hanks, MD Admit Date: 01/29/18  Reason for Consultation/Follow-up: Establishing goals of care  Subjective: Greg Jones remains unable to communicate.  Mildly restless at times   No family present at bedside.  See below:  Length of Stay: 5  Current Medications: Scheduled Meds:  . chlorhexidine  15 mL Mouth Rinse BID  . Chlorhexidine Gluconate Cloth  6 each Topical Q0600  . glycopyrrolate  0.2 mg Intravenous Q4H  . heparin  5,000 Units Subcutaneous Q8H  . insulin aspart  0-5 Units Subcutaneous QHS  . insulin aspart  0-9 Units Subcutaneous TID WC  . ipratropium-albuterol  3 mL Nebulization TID  . mouth rinse  15 mL Mouth Rinse q12n4p  . mometasone-formoterol  2 puff Inhalation BID  . mupirocin ointment  1 application Nasal BID  . tiotropium  18 mcg Inhalation Q24H    Continuous Infusions: . sodium chloride 100 mL/hr at 01/22/18 1242  . famotidine (PEPCID) IV Stopped (01/22/18 1014)  . vancomycin Stopped (01/22/18 0939)    PRN Meds: albuterol, fentaNYL (SUBLIMAZE) injection, labetalol, ondansetron **OR** ondansetron (ZOFRAN) IV  Physical Exam         General: Intermittent moaning.  HEENT: No bruits, no goiter, no JVD Heart: Regular rate and rhythm. No murmur appreciated. Lungs: Diminished air movement, rhochi Abdomen: Soft, mild tender, distended, positive bowel sounds.  Ext: No significant edema Skin: Warm and dry. Exfoliating skin rash.  Vital Signs: BP (!) 121/57 (BP Location: Right Arm)   Pulse 93   Temp 97.9 F (36.6 C) (Oral)   Resp 20   Ht 5\' 8"  (1.727 m)    Wt 70.2 kg (154 lb 12.2 oz)   SpO2 98%   BMI 23.53 kg/m  SpO2: SpO2: 98 % O2 Device: O2 Device: Nasal Cannula O2 Flow Rate: O2 Flow Rate (L/min): 2 L/min  Intake/output summary:   Intake/Output Summary (Last 24 hours) at 01/22/2018 1611 Last data filed at 01/22/2018 1116 Gross per 24 hour  Intake 688.72 ml  Output 750 ml  Net -61.28 ml   LBM: Last BM Date: 01/20/18 Baseline Weight: Weight: 69.4 kg (153 lb) Most recent  weight: Weight: 70.2 kg (154 lb 12.2 oz)       Palliative Assessment/Data:    Flowsheet Rows     Most Recent Value  Intake Tab  Referral Department  Hospitalist  Unit at Time of Referral  ICU  Palliative Care Primary Diagnosis  Sepsis/Infectious Disease  Date Notified  01/18/18  Palliative Care Type  New Palliative care  Reason for referral  Clarify Goals of Care  Date of Admission  01-25-18  Date first seen by Palliative Care  01/18/18  # of days Palliative referral response time  0 Day(s)  # of days IP prior to Palliative referral  1  Clinical Assessment  Palliative Performance Scale Score  10%  Psychosocial & Spiritual Assessment  Palliative Care Outcomes      Patient Active Problem List   Diagnosis Date Noted  . Shock liver 01/21/2018  . Pressure injury of left heel, stage 3 (HCC) 01/21/2018  . Pressure injury of right heel, stage 3 (HCC) 01/21/2018  . Acute renal failure superimposed on stage 4 chronic kidney disease (HCC) 01/20/2018  . Acute metabolic encephalopathy   . Abdominal aortic aneurysm (AAA) without rupture (HCC)   . MRSA bacteremia   . Proctitis 01/18/2018  . Hypernatremia 01/18/2018  . AKI (acute kidney injury) (HCC) 01/18/2018  . Hyperkalemia 01/18/2018  . Transaminitis 01/18/2018  . Toxic encephalopathy 01/18/2018  . Gout 01/18/2018  . Severe sepsis with septic shock (HCC) 2018-01-25  . Acute renal failure (HCC)   . Poor appetite 01/02/2018  . Acute on chronic respiratory failure with hypoxemia (HCC) 11/22/2017  . HCAP  (healthcare-associated pneumonia) 11/22/2017  . Aspiration pneumonia (HCC) 11/22/2017  . Rash and nonspecific skin eruption 11/22/2017  . COPD with acute exacerbation (HCC) 10/18/2017  . Type 2 diabetes mellitus with other specified complication (HCC) 10/12/2017  . Pruritic rash 08/14/2017  . Pain and swelling of toe of left foot 08/14/2017  . Stage I pressure ulcer 04/12/2016  . CKD (chronic kidney disease), stage IV (HCC) 04/11/2016  . QT prolongation 04/11/2016  . Hypokalemia 04/11/2016  . Chronic combined systolic and diastolic CHF (congestive heart failure) (HCC) 01/11/2016  . GERD (gastroesophageal reflux disease) 05/28/2015  . COPD (chronic obstructive pulmonary disease) (HCC) 05/10/2015  . Essential hypertension 05/10/2015    Palliative Care Assessment & Plan   Patient Profile: 84 y.o. male  with past medical history of COPD, CKD, CHF, HTN long term care resident admitted on January 25, 2018 with altered mental status, hypernatremia, AKI, and MRSA bacteremia.   Assessment: Patient Active Problem List   Diagnosis Date Noted  . Shock liver 01/21/2018  . Pressure injury of left heel, stage 3 (HCC) 01/21/2018  . Pressure injury of right heel, stage 3 (HCC) 01/21/2018  . Acute renal failure superimposed on stage 4 chronic kidney disease (HCC) 01/20/2018  . Acute metabolic encephalopathy   . Abdominal aortic aneurysm (AAA) without rupture (HCC)   . MRSA bacteremia   . Proctitis 01/18/2018  . Hypernatremia 01/18/2018  . AKI (acute kidney injury) (HCC) 01/18/2018  . Hyperkalemia 01/18/2018  . Transaminitis 01/18/2018  . Toxic encephalopathy 01/18/2018  . Gout 01/18/2018  . Severe sepsis with septic shock (HCC) January 25, 2018  . Acute renal failure (HCC)   . Poor appetite 01/02/2018  . Acute on chronic respiratory failure with hypoxemia (HCC) 11/22/2017  . HCAP (healthcare-associated pneumonia) 11/22/2017  . Aspiration pneumonia (HCC) 11/22/2017  . Rash and nonspecific skin eruption  11/22/2017  . COPD with acute exacerbation (HCC) 10/18/2017  . Type  2 diabetes mellitus with other specified complication (HCC) 10/12/2017  . Pruritic rash 08/14/2017  . Pain and swelling of toe of left foot 08/14/2017  . Stage I pressure ulcer 04/12/2016  . CKD (chronic kidney disease), stage IV (HCC) 04/11/2016  . QT prolongation 04/11/2016  . Hypokalemia 04/11/2016  . Chronic combined systolic and diastolic CHF (congestive heart failure) (HCC) 01/11/2016  . GERD (gastroesophageal reflux disease) 05/28/2015  . COPD (chronic obstructive pulmonary disease) (HCC) 05/10/2015  . Essential hypertension 05/10/2015    Recommendations/Plan: -continue current scope of care.  No family at bedside, my colleague Dr Neale BurlyFreeman has discussed with sister about high likelihood of patient not surviving this hospitalization, overall goals are for avoiding suffering.  Continue current PRNs for comfort    Goals of Care and Additional Recommendations:  Limitations on Scope of Treatment: No escalation of care/no aggressive interventions  Code Status:    Code Status Orders  (From admission, onward)        Start     Ordered   01/18/18 0721  Do not attempt resuscitation (DNR)  Continuous     01/18/18 0721    Code Status History    Date Active Date Inactive Code Status Order ID Comments User Context   02/04/2018 1717 01/18/2018 0721 Full Code [id number]  Zigmund DanielPowell, A Caldwell Jr., MD ED   11/12/2017 1510 11/17/2017 1842 Full Code [id number]  Alberteen Samanford, Christopher P, MD ED   10/18/2017 2315 10/25/2017 2105 Full Code [id number]  Jonah BlueYates, Jennifer, MD Inpatient   08/14/2017 2209 08/22/2017 1627 Full Code [id number]  Clydie BraunSmith, Rondell A, MD ED   09/07/2016 1820 09/11/2016 1721 Full Code [id number]  Orpah CobbKadakia, Ajay, MD Inpatient   04/11/2016 1850 04/12/2016 1950 Full Code [id number]  Albertine GratesXu, Fang, MD Inpatient   03/15/2016 1839 03/18/2016 1539 Full Code [id number]  Orpah CobbKadakia, Ajay, MD Inpatient   09/07/2015 1554 09/14/2015 1712 Full Code [id number]   Parrett, Virgel Bouquetammy S, NP Inpatient       Prognosis:   some very limited number of days, in my opinion.   Discharge Planning:  To Be Determined- I believe this is very likely to be a terminal admission  Care plan was discussed with    Thank you for allowing the Palliative Medicine Team to assist in the care of this patient.   Time In: 1050 Time Out: 1105 Total Time 15 Prolonged Time Billed No      Greater than 50%  of this time was spent counseling and coordinating care related to the above assessment and plan.  Rosalin HawkingZeba Raelan Burgoon, MD [phone]  Please contact Palliative Medicine Team phone at [phone] for questions and concerns.

## 2018-01-22 NOTE — Care Management Important Message (Signed)
Important Message  Patient Details IM Letter given to Rhona/Case Manager to present to the Patient Name: Greg RobertsJonathan Jones MRN: [id number] Date of Birth: 08/26/1927   Medicare Important Message Given:  Yes    Caren MacadamFuller, Christabella Alvira 01/22/2018, 10:52 AMImportant Message  Patient Details  Name: Greg RobertsJonathan Jones MRN: [id number] Date of Birth: 05/20/1928   Medicare Important Message Given:  Yes    Caren MacadamFuller, Zayne Draheim 01/22/2018, 10:52 AM

## 2018-01-22 NOTE — Progress Notes (Signed)
Rt changed pt nebs to Duoneb TID due to pt not able to take his Spiriva QD.

## 2018-01-22 NOTE — Progress Notes (Addendum)
[facility] PROGRESS NOTE    Progress Note  Greg Jones  ZOX:[id number] DOB: 08-31-1927 DOA: 02-11-2018 PCP: Margit Hanks, MD     Brief Narrative:   Greg Jones is an 84 y.o. male past medical history of chronic kidney disease, COPD hypertension presented with sepsis and abdominal pain in the ED a CT scan was done that showed evidence of proctitis  Assessment/Plan:   Sepsis due MRSA bacteremia possibly proctitis Started empirically on sepsis pathway on IV antibiotics and IV fluids. PCR ID reflex panel show MRSA. Repeated blood cultures on 01/19/1999 19 can remain positive. Patient has a very poor prognosis.  But the family will not want to move towards comfort care. He did agree to not escalate care.  Acute kidney injury on chronic kidney disease stage III: Now possibly ATN as his creatinine is not improving. He is not a candidate for dialysis. Baseline creatinine around 2. He is positive about 7 L we will KVO IV fluids. I am afraid that the options are limited he is not a dialysis candidate and his renal insufficiency is not improving with aggressive treatment.   Renal ultrasound showed chronic medical renal disease Basic metabolic panel is pending.  Hypovolemic hypernatremia: Basic metabolic panel is pending this morning, he was given  Transaminitis/Shock liver:  The setting of dehydration and sepsis, improving.  Acute toxic encephalopathy: Likely due to sepsis and probably hypernatremia As per family member he is a DNR/DNI. Patient condition has been deteriorating over the last month. Hospice and palliative care to meet with family, they would like not to escalate treatment.  Rash: Diffuse desquamative rash seems to be like a drug reaction.  Pressure ulcer bilateral of the heels: Wound care has been consulted to try to relieve pressure.  Diabetes mellitus type 2: Continue sliding scale insulin. Last A1c was 8 on 12/2017.  Chronic diastolic and  systolic heart failure: Continue strict I's and O's he appears to be dry, urine output about 700 cc. Abdominal aortic aneurysm We will need to follow-up with vascular as an outpatient.  Essential hypertension Seems to be slightly high we will try to keep it on the high side due to his new acute renal failure, will use labetalol for systolic blood pressure greater than 190.  Unstageable pressure ulcer of left and right heels: WOC     DVT prophylaxis: lovenox Family Communication:Sister Disposition Plan/Barrier to D/C: unable to determine Code Status:     Code Status Orders  (From admission, onward)        Start     Ordered   02-11-2018 1717  Full code  Continuous     02/11/2018 1716    Code Status History    Date Active Date Inactive Code Status Order ID Comments User Context   11/12/2017 1510 11/17/2017 1842 Full Code [id number]  Alberteen Sam, MD ED   10/18/2017 2315 10/25/2017 2105 Full Code [id number]  Jonah Blue, MD Inpatient   08/14/2017 2209 08/22/2017 1627 Full Code [id number]  Clydie Braun, MD ED   09/07/2016 1820 09/11/2016 1721 Full Code [id number]  Orpah Cobb, MD Inpatient   04/11/2016 1850 04/12/2016 1950 Full Code [id number]  Albertine Grates, MD Inpatient   03/15/2016 1839 03/18/2016 1539 Full Code [id number]  Orpah Cobb, MD Inpatient   09/07/2015 1554 09/14/2015 1712 Full Code [id number]  Parrett, Virgel Bouquet, NP Inpatient        IV Access:    Peripheral IV   Procedures and diagnostic studies:   No results  found.   Medical Consultants:    None.  Anti-Infectives:   IV cefepime and flagyl  Subjective:    Greg Jones patient continues to be nonverbal moaning and groaning.  Objective:    Vitals:   01/22/18 0121 01/22/18 0433 01/22/18 0820 01/22/18 0822  BP:  124/72    Pulse:  89    Resp:  16    Temp:  98.1 F (36.7 C)    TempSrc:      SpO2: 91% 92% 93% 93%  Weight:      Height:        Intake/Output Summary (Last 24 hours) at 01/22/2018  1209 Last data filed at 01/22/2018 1116 Gross per 24 hour  Intake 1429.16 ml  Output 750 ml  Net 679.16 ml   Filed Weights   01/18/18 1019 01/20/18 0500 01/21/18 0500  Weight: 69.4 kg (153 lb) 70 kg (154 lb 5.2 oz) 70.2 kg (154 lb 12.2 oz)    Exam: General exam: In no acute distress. Respiratory system: Good air movement and clear to auscultation. Cardiovascular system: S1 & S2 heard, RRR. No JVD. Gastrointestinal system: Abdomen is nondistended, soft and tenderness. Central nervous system: Not alert or oriented.. Only responsive to pain. Extremities: No pedal edema. Skin: scaling desquamative.    Data Reviewed:    Labs: Basic Metabolic Panel: Recent Labs  Lab 01/29/2018 1734 01/18/18 0253 01/19/18 0445 01/20/18 0402 01/21/18 0412  NA 158* 158* 157* 153* 153*  K 5.5* 5.0 4.3 3.8 4.7  CL 120* 121* 125* 119* 121*  CO2 22 20* 17* 20* 14*  GLUCOSE 192* 128* 215* 150* 127*  BUN 80* 86* 86* 77* 70*  CREATININE 5.71* 5.55* 5.05* 4.53* 4.54*  CALCIUM 8.4* 8.3* 8.1* 8.3* 8.7*  MG  --  2.4  --   --   --    GFR Estimated Creatinine Clearance: 10.9 mL/min (A) (by C-G formula based on SCr of 4.54 mg/dL (H)). Liver Function Tests: Recent Labs  Lab 02/09/2018 1217 01/18/18 0253  AST 80* 74*  ALT 59* 47*  ALKPHOS 259* 184*  BILITOT 0.7 0.4  PROT 8.0 6.5  ALBUMIN 2.5* 2.1*   Recent Labs  Lab 01/20/2018 1217  LIPASE 21   No results for input(s): AMMONIA in the last 168 hours. Coagulation profile No results for input(s): INR, PROTIME in the last 168 hours.  CBC: Recent Labs  Lab 02/16/2018 1217 01/18/18 0253  WBC 14.9* 9.5  NEUTROABS 12.9*  --   HGB 12.8* 10.4*  HCT 41.2 33.1*  MCV 83.1 80.9  PLT 387 288   Cardiac Enzymes: No results for input(s): CKTOTAL, CKMB, CKMBINDEX, TROPONINI in the last 168 hours. BNP (last 3 results) No results for input(s): PROBNP in the last 8760 hours. CBG: Recent Labs  Lab 01/21/18 0732 01/21/18 1215 01/21/18 1638  01/21/18 2118 01/22/18 0728  GLUCAP 117* 117* 99 98 80   D-Dimer: No results for input(s): DDIMER in the last 72 hours. Hgb A1c: No results for input(s): HGBA1C in the last 72 hours. Lipid Profile: No results for input(s): CHOL, HDL, LDLCALC, TRIG, CHOLHDL, LDLDIRECT in the last 72 hours. Thyroid function studies: No results for input(s): TSH, T4TOTAL, T3FREE, THYROIDAB in the last 72 hours.  Invalid input(s): FREET3 Anemia work up: No results for input(s): VITAMINB12, FOLATE, FERRITIN, TIBC, IRON, RETICCTPCT in the last 72 hours. Sepsis Labs: Recent Labs  Lab 02/11/2018 1217 02/13/2018 1220 01/30/2018 1413 01/21/2018 1734 02/07/2018 2039 01/18/18 0253  WBC 14.9*  --   --   --   --  9.5  LATICACIDVEN  --  6.93* 5.43* 3.3* 3.8*  --    Microbiology Recent Results (from the past 240 hour(s))  Culture, blood (routine x 2)     Status: Abnormal   Collection Time: 01/22/2018 11:40 AM  Result Value Ref Range Status   Specimen Description   Final    BLOOD RIGHT ANTECUBITAL Performed at [facility], [address] [place], [place] [address]    Special Requests   Final    BOTTLES DRAWN AEROBIC AND ANAEROBIC Blood Culture adequate volume Performed at [facility], [address] [place], [place] [address]    Culture  Setup Time   Final    IN BOTH AEROBIC AND ANAEROBIC BOTTLES GRAM POSITIVE COCCI CRITICAL RESULT CALLED TO, READ BACK BY AND VERIFIED WITH: PHARMD J GADHIA 01/18/18 AT 823 AM BY CM Performed at [facility], [address], [place], [place] [address]    Culture METHICILLIN RESISTANT STAPHYLOCOCCUS AUREUS (A)  Final   Report Status 01/20/2018 FINAL  Final   Organism ID, Bacteria METHICILLIN RESISTANT STAPHYLOCOCCUS AUREUS  Final      Susceptibility   Methicillin resistant staphylococcus aureus - MIC*    CIPROFLOXACIN >=8 RESISTANT Resistant     ERYTHROMYCIN >=8 RESISTANT Resistant     GENTAMICIN <=0.5 SENSITIVE Sensitive      OXACILLIN >=4 RESISTANT Resistant     TETRACYCLINE >=16 RESISTANT Resistant     VANCOMYCIN 1 SENSITIVE Sensitive     TRIMETH/SULFA >=320 RESISTANT Resistant     CLINDAMYCIN >=8 RESISTANT Resistant     RIFAMPIN <=0.5 SENSITIVE Sensitive     Inducible Clindamycin NEGATIVE Sensitive     * METHICILLIN RESISTANT STAPHYLOCOCCUS AUREUS  Culture, blood (routine x 2)     Status: Abnormal   Collection Time: 02/04/2018 11:40 AM  Result Value Ref Range Status   Specimen Description   Final    BLOOD BLOOD RIGHT FOREARM Performed at [facility], [address], [place], [place] [address]    Special Requests   Final    BOTTLES DRAWN AEROBIC ONLY Blood Culture adequate volume Performed at [facility], [address] [place], [place] [address]    Culture  Setup Time   Final    GRAM POSITIVE COCCI AEROBIC BOTTLE ONLY CRITICAL VALUE NOTED.  VALUE IS CONSISTENT WITH PREVIOUSLY REPORTED AND CALLED VALUE.    Culture (A)  Final    STAPHYLOCOCCUS AUREUS SUSCEPTIBILITIES PERFORMED ON PREVIOUS CULTURE WITHIN THE LAST 5 DAYS. Performed at [facility], [address], [place], [place] [address]    Report Status 01/20/2018 FINAL  Final  Blood Culture ID Panel (Reflexed)     Status: Abnormal   Collection Time: 02/06/2018 11:40 AM  Result Value Ref Range Status   Enterococcus species NOT DETECTED NOT DETECTED Final   Listeria monocytogenes NOT DETECTED NOT DETECTED Final   Staphylococcus species DETECTED (A) NOT DETECTED Final    Comment: CRITICAL RESULT CALLED TO, READ BACK BY AND VERIFIED WITH: PHARMD J GADHIA 01/18/18 AT 825 AM BYCM    Staphylococcus aureus DETECTED (A) NOT DETECTED Final    Comment: Methicillin (oxacillin)-resistant Staphylococcus aureus (MRSA). MRSA is predictably resistant to beta-lactam antibiotics (except ceftaroline). Preferred therapy is vancomycin unless clinically contraindicated. Patient requires contact precautions if   hospitalized. CRITICAL RESULT CALLED TO, READ BACK BY AND VERIFIED WITH: PHARMD J GADHIA 01/18/18 AT 825 AM BY CM    Methicillin resistance DETECTED (A) NOT DETECTED Final  Comment: CRITICAL RESULT CALLED TO, READ BACK BY AND VERIFIED WITH: PHARMD J GADHIA 01/18/18 AT 825 BY CM    Streptococcus species NOT DETECTED NOT DETECTED Final   Streptococcus agalactiae NOT DETECTED NOT DETECTED Final   Streptococcus pneumoniae NOT DETECTED NOT DETECTED Final   Streptococcus pyogenes NOT DETECTED NOT DETECTED Final   Acinetobacter baumannii NOT DETECTED NOT DETECTED Final   Enterobacteriaceae species NOT DETECTED NOT DETECTED Final   Enterobacter cloacae complex NOT DETECTED NOT DETECTED Final   Escherichia coli NOT DETECTED NOT DETECTED Final   Klebsiella oxytoca NOT DETECTED NOT DETECTED Final   Klebsiella pneumoniae NOT DETECTED NOT DETECTED Final   Proteus species NOT DETECTED NOT DETECTED Final   Serratia marcescens NOT DETECTED NOT DETECTED Final   Haemophilus influenzae NOT DETECTED NOT DETECTED Final   Neisseria meningitidis NOT DETECTED NOT DETECTED Final   Pseudomonas aeruginosa NOT DETECTED NOT DETECTED Final   Candida albicans NOT DETECTED NOT DETECTED Final   Candida glabrata NOT DETECTED NOT DETECTED Final   Candida krusei NOT DETECTED NOT DETECTED Final   Candida parapsilosis NOT DETECTED NOT DETECTED Final   Candida tropicalis NOT DETECTED NOT DETECTED Final    Comment: Performed at [facility], [address] [place], [place] [address]  Urine culture     Status: Abnormal   Collection Time: 02-02-2018  3:07 PM  Result Value Ref Range Status   Specimen Description   Final    URINE, RANDOM Performed at [facility], [address], [place], [place] [address]    Special Requests   Final    NONE Performed at [facility], [address], [place], [place] [address]    Culture MULTIPLE SPECIES PRESENT, SUGGEST RECOLLECTION (A)   Final   Report Status 01/18/2018 FINAL  Final  MRSA PCR Screening     Status: Abnormal   Collection Time: 02/02/18  6:45 PM  Result Value Ref Range Status   MRSA by PCR POSITIVE (A) NEGATIVE Final    Comment:        The GeneXpert MRSA Assay (FDA approved for NASAL specimens only), is one component of a comprehensive MRSA colonization surveillance program. It is not intended to diagnose MRSA infection nor to guide or monitor treatment for MRSA infections. RESULT CALLED TO, READ BACK BY AND VERIFIED WITH: MCMILLIAN,S AT 2005 ON Feb 02, 2018 BY MOSLEY,J Performed at [facility], [address] [place], [place] [address]   Culture, blood (Routine X 2) w Reflex to ID Panel     Status: Abnormal   Collection Time: 01/18/18  9:48 AM  Result Value Ref Range Status   Specimen Description   Final    BLOOD LEFT HAND Performed at [facility], [address] [place], [place] [address]    Special Requests   Final    BOTTLES DRAWN AEROBIC ONLY Blood Culture results may not be optimal due to an inadequate volume of blood received in culture bottles Performed at [facility], [address], [place], [place] [address]    Culture  Setup Time   Final    AEROBIC BOTTLE ONLY GRAM POSITIVE COCCI CRITICAL VALUE NOTED.  VALUE IS CONSISTENT WITH PREVIOUSLY REPORTED AND CALLED VALUE.    Culture (A)  Final    STAPHYLOCOCCUS AUREUS SUSCEPTIBILITIES PERFORMED ON PREVIOUS CULTURE WITHIN THE LAST 5 DAYS. Performed at [facility], [address] [place], [place] [address]    Report Status 01/21/2018 FINAL  Final  Culture, blood (Routine X 2) w Reflex to ID Panel     Status: Abnormal   Collection Time: 01/18/18  9:58 AM  Result Value Ref Range Status   Specimen Description   Final    BLOOD LEFT ARM Performed at [facility], [address] [place], [place] [address]    Special Requests   Final    BOTTLES DRAWN AEROBIC  ONLY Blood Culture results may not be optimal due to an inadequate volume of blood received in culture bottles Performed at [facility], [address], [place], [place] [address]    Culture  Setup Time   Final    AEROBIC BOTTLE ONLY GRAM POSITIVE COCCI CRITICAL VALUE NOTED.  VALUE IS CONSISTENT WITH PREVIOUSLY REPORTED AND CALLED VALUE.    Culture (A)  Final    STAPHYLOCOCCUS AUREUS SUSCEPTIBILITIES PERFORMED ON PREVIOUS CULTURE WITHIN THE LAST 5 DAYS. Performed at [facility], [address] [place], [place] [address]    Report Status 01/21/2018 FINAL  Final  Culture, blood (Routine X 2) w Reflex to ID Panel     Status: None (Preliminary result)   Collection Time: 01/19/18  8:25 AM  Result Value Ref Range Status   Specimen Description BLOOD BLOOD RIGHT HAND  Final   Special Requests   Final    BOTTLES DRAWN AEROBIC ONLY Blood Culture results may not be optimal due to an inadequate volume of blood received in culture bottles   Culture   Final    NO GROWTH 2 DAYS Performed at [facility], [address], [place], [place] [address]    Report Status PENDING  Incomplete  Culture, blood (Routine X 2) w Reflex to ID Panel     Status: None (Preliminary result)   Collection Time: 01/19/18  8:26 AM  Result Value Ref Range Status   Specimen Description   Final    BLOOD LEFT HAND Performed at [facility], [address], [place], [place] [address]    Special Requests   Final    BOTTLES DRAWN AEROBIC ONLY Blood Culture adequate volume Performed at [facility], [address], [place], [place] [address]    Culture   Final    NO GROWTH 2 DAYS Performed at [facility], [address], [place], [place] [address]    Report Status PENDING  Incomplete     Medications:   . chlorhexidine  15 mL Mouth Rinse BID  . Chlorhexidine Gluconate Cloth  6 each Topical Q0600  . glycopyrrolate  0.2 mg Intravenous Q4H  .  heparin  5,000 Units Subcutaneous Q8H  . insulin aspart  0-5 Units Subcutaneous QHS  . insulin aspart  0-9 Units Subcutaneous TID WC  . ipratropium-albuterol  3 mL Nebulization TID  . mouth rinse  15 mL Mouth Rinse q12n4p  . mometasone-formoterol  2 puff Inhalation BID  . mupirocin ointment  1 application Nasal BID  . tiotropium  18 mcg Inhalation Q24H   Continuous Infusions: . sodium chloride Stopped (01/22/18 0839)  . famotidine (PEPCID) IV Stopped (01/22/18 1014)  . vancomycin Stopped (01/22/18 9629)      LOS: 5 days   Marinda Elk  [facility] Pager [phone]  *Please refer to [email or web address], password [email or web address] to get updated schedule on who will round on this patient, as hospitalists switch teams weekly. If 7PM-7AM, please contact night-coverage at [email or web address], password [email or web address] for any overnight needs.  01/22/2018,  12:09 PM

## 2018-01-22 NOTE — Progress Notes (Signed)
SLP Cancellation Note  Patient Details Name: Greg RobertsJonathan Jones MRN: [id number] DOB: 11/14/1927   Cancelled treatment:       Reason Eval/Treat Not Completed: Fatigue/lethargy limiting ability to participate. Per RN, pt's mentation remains unchanged. He is responsive to pain only. He remains unable to participate in swallow evaluation due to his reduced level of alertness. SLP to sign off for now - please reorder when he is able to participate.   Greg Jones, Greg Jones 01/22/2018, 2:37 PM  Greg Jones, Greg Jones [phone]

## 2018-01-23 ENCOUNTER — Inpatient Hospital Stay (HOSPITAL_COMMUNITY): Payer: Medicare Other

## 2018-01-23 MED ORDER — VANCOMYCIN HCL 10 G IV SOLR
1500.0000 mg | Freq: Once | INTRAVENOUS | Status: DC
  Filled 2018-01-23: qty 1500

## 2018-01-23 MED ORDER — METHYLPREDNISOLONE SODIUM SUCC 125 MG IJ SOLR: 60.0000 mg | Freq: Once | INTRAMUSCULAR | Status: DC

## 2018-01-24 LAB — CULTURE, BLOOD (ROUTINE X 2)
CULTURE: NO GROWTH
Culture: NO GROWTH
Special Requests: ADEQUATE

## 2018-01-30 ENCOUNTER — Encounter: Payer: Self-pay | Admitting: Internal Medicine

## 2018-02-17 NOTE — Progress Notes (Signed)
Shift event: RN paged NP because pt was dropping O2 sats into the 70s, increased to the 80s with 6L O2 per [place]. Wheezing. His RR is 30 and he is using accessory muscles. He remains mostly unresponsive, non verbal.  S: pt can not participate in ROS due to mental status. Per RN, the respiratory status just changed. O: very poor appearing male in moderate respiratory distress who appears to be actively dying. NRB applied and O2 sats normal. RR high 20s-30s with use of accessory muscles. He opens his eyes only slightly (and this is questionable) to name and tactile stimulation. He is basically unresponsive and is non verbal. Lungs with rhonchi at the bases. NP hears no wheezing. Card: RRR.  A/P: 1. Hypoxic respiratory failure-he has been seen and sister has spoken to palliative care about the possibility of death in the hospital. NP called sister and told her that the pt has taken a turn for the worse and appears to be nearing the end. Asked permission to d/c treatments except for those that keep him comfortable. She replied "it sounds like it is time". Then she said abruptly, "do not call me in the middle of the night, just call me in the mornings" and hung up the phone. Treatments d/c'd except those for comfort[facility].  DNR. KJKG, NP [facility]

## 2018-02-17 NOTE — Death Summary Note (Signed)
Death Summary  Greg Jones WJX:[id number] DOB: 1927/10/19 DOA: 01/28/2018  PCP: Margit Hanks, MD  Admit date: 01/28/2018 Date of Death: 03-Feb-2018 Time of Death: 7:15 am Notification: Margit Hanks, MD notified of death of 02/03/2018   History of present illness:  Greg Jones is an 84 y.o. male past medical history of chronic kidney disease, COPD hypertension presented with sepsis and abdominal pain. In the ED a CT scan was done that showed evidence of proctitis. Pt was subsequently found to have MRSA bacteremia likely from ?proctitis. Pt was also noted to have acute encephalopathy. Pt was made DNR and palliative and hospice consulted. In agreement with the family, no escalation of treatment, due to very poor prognosis. Pt expired on February 03, 2018 at 7:15am.  Final Diagnoses:  Sepsis due MRSA bacteremia possibly proctitis S/p IV antibiotics and IV fluids PCR ID reflex panel show MRSA Repeated blood cultures on 8/2 remained positive Poor prognosis  Acute kidney injury on chronic kidney disease stage III Likely due to above Worsening, not a candidate for dialysis   Renal ultrasound showed chronic medical renal disease  Hypovolemic hypernatremia  Transaminitis/Shock liver Likely due to sepsis  Acute toxic encephalopathy Likely due to sepsis and probably hypernatremia Patient condition has been deteriorating over the last month Hospice and palliative care to meet with family, they would like not to escalate treatment  Pressure ulcer bilateral of the heels  Diabetes mellitus type 2 Last A1c was 8 on 12/2017  Chronic diastolic and systolic heart failure  Essential hypertension  Unstageable pressure ulcer of left and right heels     The results of significant diagnostics from this hospitalization (including imaging, microbiology, ancillary and laboratory) are listed below for reference.    Significant Diagnostic Studies: Ct Abdomen Pelvis Wo Contrast  Result  Date: 01/28/18 CLINICAL DATA:  Abdominal pain. Clinical concern for gastroenteritis or colitis. EXAM: CT ABDOMEN AND PELVIS WITHOUT CONTRAST TECHNIQUE: Multidetector CT imaging of the abdomen and pelvis was performed following the standard protocol without IV contrast. COMPARISON:  Abdomen and pelvis radiographs dated 11/17/2017 and abdomen and pelvis CT dated 02/07/2016. Abdomen ultrasound dated 08/19/2017. FINDINGS: Lower chest: Interval dependent atelectasis in the right lower lobe. Hepatobiliary: Mild sludge in the gallbladder. Normal appearing liver. Pancreas: Diffuse atrophy with partial fatty replacement. Spleen: Normal in size without focal abnormality. Adrenals/Urinary Tract: Adrenal glands are unremarkable. Kidneys are normal, without renal calculi, focal lesion, or hydronephrosis. Bladder is unremarkable. Stomach/Bowel: Multiple right colon diverticula without evidence of diverticulitis. Interval mild diffuse rectal wall thickening. Normal appearing appendix, stomach and small bowel. Vascular/Lymphatic: Atheromatous arterial calcifications. And wrist small dilatation of the proximal infrarenal abdominal aorta with a more prominent focal out pouching posterolaterally on the left. The maximum total diameter, including the focal outpouching, is 4.2 cm, previously 3.6 cm. The focal outpouching is rounded and measures 1.5 cm in maximum diameter on coronal image number 57 and 1.6 cm in maximum diameter on axial image number 29 series 2. Also demonstrated is mild aneurysmal dilatation of the left common iliac artery with a maximum AP diameter 1.8 cm on image number 52 series 2. No enlarged lymph nodes. Reproductive: Mildly and enlarged prostate gland containing coarse calcifications. Other: Small bilateral inguinal hernias containing fat. Musculoskeletal: Bilateral hip degenerative changes. Lumbar and lower thoracic spine degenerative changes. IMPRESSION: 1. Progressive aneurysmal dilatation of the proximal  infrarenal abdominal aorta due to a more prominent focal rounded outpouching on the left. The maximum combined diameter is 4.2 cm, increased from 3.6 cm.  Recommend vascular surgery consultation due to the increased size of the more focal rounded outpouching, currently measuring 1.6 cm. 2. 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 3. Interval mild diffuse rectal wall thickening, suggesting mild proctitis. Right colon diverticulosis without evidence of diverticulitis. 4. Mild sludge in the gallbladder. 5. Diffuse pancreatic atrophy. 6. Right lower lobe dependent atelectasis. Electronically Signed   By: Beckie Salts M.D.   On: 02/04/2018 13:45   Dg Chest 2 View  Result Date: 02/10/2018 CLINICAL DATA:  Mental status change EXAM: CHEST - 2 VIEW COMPARISON:  11/17/2017 FINDINGS: The heart size and mediastinal contours are within normal limits. Both lungs are clear. The visualized skeletal structures are unremarkable. IMPRESSION: No active cardiopulmonary disease. Electronically Signed   By: Marlan Palau M.D.   On: 02/07/2018 12:59   Ct Head Wo Contrast  Result Date: 02/12/2018 CLINICAL DATA:  Unresponsive, encephalopathy EXAM: CT HEAD WITHOUT CONTRAST TECHNIQUE: Contiguous axial images were obtained from the base of the skull through the vertex without intravenous contrast. COMPARISON:  None. FINDINGS: Brain: No evidence of acute infarction, hemorrhage, extra-axial collection or mass lesion/mass effect. Subcortical white matter and periventricular small vessel ischemic changes. Cortical and central atrophy.  No ventriculomegaly. Vascular: Mild intracranial atherosclerosis. Skull: Normal. Negative for fracture or focal lesion. Sinuses/Orbits: The visualized paranasal sinuses are essentially clear. The mastoid air cells are unopacified. Other: None. IMPRESSION: No evidence of acute intracranial abnormality. Atrophy with small vessel ischemic changes. Electronically  Signed   By: Charline Bills M.D.   On: 02/01/2018 18:25   US Renal  Result Date: 01/20/2018 CLINICAL DATA:  Acute kidney injury. EXAM: RENAL / URINARY TRACT ULTRASOUND COMPLETE COMPARISON:  CT abdomen and pelvis 02/01/2018 FINDINGS: Right Kidney: Length: 9.1 cm. Mild cortical thinning. Borderline to mildly increased parenchymal echogenicity. No mass or hydronephrosis visualized. Left Kidney: Length: 9.1 cm. Mild cortical thinning and increased parenchymal echogenicity. No hydronephrosis. 1.6 cm upper pole cyst. Bladder: Appears normal for degree of bladder distention. IMPRESSION: Findings of medical renal disease.  No hydronephrosis. Electronically Signed   By: Sebastian Ache M.D.   On: 01/20/2018 12:05   Dg Chest Port 1 View  Result Date: 01/19/2018 CLINICAL DATA:  Shortness of breath. EXAM: PORTABLE CHEST 1 VIEW COMPARISON:  01/18/2018 FINDINGS: The patient is mildly rotated to the right. The cardiomediastinal silhouette is unchanged mild right basilar opacity is unchanged. The left lung remains clear. No sizable pleural effusion or pneumothorax is identified. IMPRESSION: Unchanged right basilar opacity which could reflect pneumonia or atelectasis. Electronically Signed   By: Sebastian Ache M.D.   On: 01/19/2018 07:19   Dg Chest Port 1 View  Result Date: 01/18/2018 CLINICAL DATA:  Sepsis and shortness of breath. EXAM: PORTABLE CHEST 1 VIEW COMPARISON:  01/24/2018 FINDINGS: The heart size is stable and normal. Stable tortuosity of the thoracic aorta. Mild new opacity at the right lung base represents either atelectasis or developing infiltrate. There is no evidence of pulmonary edema, pneumothorax, nodule or pleural fluid. Both lungs are clear. The visualized skeletal structures are unremarkable. IMPRESSION: Mild new opacity at the right lung base representing either atelectasis or developing pneumonia. Electronically Signed   By: Irish Lack M.D.   On: 01/18/2018 08:34    Microbiology: Recent  Results (from the past 240 hour(s))  Culture, blood (routine x 2)     Status: Abnormal   Collection Time: 01/22/2018 11:40 AM  Result Value Ref Range Status  Specimen Description   Final    BLOOD RIGHT ANTECUBITAL Performed at [facility], [address], [place], [place] [address]    Special Requests   Final    BOTTLES DRAWN AEROBIC AND ANAEROBIC Blood Culture adequate volume Performed at [facility], [address], [place], [place] [address]    Culture  Setup Time   Final    IN BOTH AEROBIC AND ANAEROBIC BOTTLES GRAM POSITIVE COCCI CRITICAL RESULT CALLED TO, READ BACK BY AND VERIFIED WITH: PHARMD J GADHIA 01/18/18 AT 823 AM BY CM Performed at [facility], [address] [place], [place] [address]    Culture METHICILLIN RESISTANT STAPHYLOCOCCUS AUREUS (A)  Final   Report Status 01/20/2018 FINAL  Final   Organism ID, Bacteria METHICILLIN RESISTANT STAPHYLOCOCCUS AUREUS  Final      Susceptibility   Methicillin resistant staphylococcus aureus - MIC*    CIPROFLOXACIN >=8 RESISTANT Resistant     ERYTHROMYCIN >=8 RESISTANT Resistant     GENTAMICIN <=0.5 SENSITIVE Sensitive     OXACILLIN >=4 RESISTANT Resistant     TETRACYCLINE >=16 RESISTANT Resistant     VANCOMYCIN 1 SENSITIVE Sensitive     TRIMETH/SULFA >=320 RESISTANT Resistant     CLINDAMYCIN >=8 RESISTANT Resistant     RIFAMPIN <=0.5 SENSITIVE Sensitive     Inducible Clindamycin NEGATIVE Sensitive     * METHICILLIN RESISTANT STAPHYLOCOCCUS AUREUS  Culture, blood (routine x 2)     Status: Abnormal   Collection Time: 02/15/2018 11:40 AM  Result Value Ref Range Status   Specimen Description   Final    BLOOD BLOOD RIGHT FOREARM Performed at [facility], [address] [place], [place] [address]    Special Requests   Final    BOTTLES DRAWN AEROBIC ONLY Blood Culture adequate volume Performed at [facility], [address] [place], [place]  [address]    Culture  Setup Time   Final    GRAM POSITIVE COCCI AEROBIC BOTTLE ONLY CRITICAL VALUE NOTED.  VALUE IS CONSISTENT WITH PREVIOUSLY REPORTED AND CALLED VALUE.    Culture (A)  Final    STAPHYLOCOCCUS AUREUS SUSCEPTIBILITIES PERFORMED ON PREVIOUS CULTURE WITHIN THE LAST 5 DAYS. Performed at [facility], [address], [place], [place] [address]    Report Status 01/20/2018 FINAL  Final  Blood Culture ID Panel (Reflexed)     Status: Abnormal   Collection Time: 02/13/2018 11:40 AM  Result Value Ref Range Status   Enterococcus species NOT DETECTED NOT DETECTED Final   Listeria monocytogenes NOT DETECTED NOT DETECTED Final   Staphylococcus species DETECTED (A) NOT DETECTED Final    Comment: CRITICAL RESULT CALLED TO, READ BACK BY AND VERIFIED WITH: PHARMD J GADHIA 01/18/18 AT 825 AM BYCM    Staphylococcus aureus DETECTED (A) NOT DETECTED Final    Comment: Methicillin (oxacillin)-resistant Staphylococcus aureus (MRSA). MRSA is predictably resistant to beta-lactam antibiotics (except ceftaroline). Preferred therapy is vancomycin unless clinically contraindicated. Patient requires contact precautions if  hospitalized. CRITICAL RESULT CALLED TO, READ BACK BY AND VERIFIED WITH: PHARMD J GADHIA 01/18/18 AT 825 AM BY CM    Methicillin resistance DETECTED (A) NOT DETECTED Final    Comment: CRITICAL RESULT CALLED TO, READ BACK BY AND VERIFIED WITH: PHARMD J GADHIA 01/18/18 AT 825 BY CM    Streptococcus species NOT DETECTED NOT DETECTED Final   Streptococcus agalactiae NOT DETECTED NOT DETECTED Final   Streptococcus pneumoniae NOT DETECTED NOT DETECTED Final  Streptococcus pyogenes NOT DETECTED NOT DETECTED Final   Acinetobacter baumannii NOT DETECTED NOT DETECTED Final   Enterobacteriaceae species NOT DETECTED NOT DETECTED Final   Enterobacter cloacae complex NOT DETECTED NOT DETECTED Final   Escherichia coli NOT DETECTED NOT DETECTED Final   Klebsiella oxytoca NOT DETECTED NOT  DETECTED Final   Klebsiella pneumoniae NOT DETECTED NOT DETECTED Final   Proteus species NOT DETECTED NOT DETECTED Final   Serratia marcescens NOT DETECTED NOT DETECTED Final   Haemophilus influenzae NOT DETECTED NOT DETECTED Final   Neisseria meningitidis NOT DETECTED NOT DETECTED Final   Pseudomonas aeruginosa NOT DETECTED NOT DETECTED Final   Candida albicans NOT DETECTED NOT DETECTED Final   Candida glabrata NOT DETECTED NOT DETECTED Final   Candida krusei NOT DETECTED NOT DETECTED Final   Candida parapsilosis NOT DETECTED NOT DETECTED Final   Candida tropicalis NOT DETECTED NOT DETECTED Final    Comment: Performed at [facility], [address] [place], [place] [address]  Urine culture     Status: Abnormal   Collection Time: 01/18/2018  3:07 PM  Result Value Ref Range Status   Specimen Description   Final    URINE, RANDOM Performed at [facility], [address], [place], [place] [address]    Special Requests   Final    NONE Performed at [facility], [address] [place], [place] [address]    Culture MULTIPLE SPECIES PRESENT, SUGGEST RECOLLECTION (A)  Final   Report Status 01/18/2018 FINAL  Final  MRSA PCR Screening     Status: Abnormal   Collection Time: 02/05/2018  6:45 PM  Result Value Ref Range Status   MRSA by PCR POSITIVE (A) NEGATIVE Final    Comment:        The GeneXpert MRSA Assay (FDA approved for NASAL specimens only), is one component of a comprehensive MRSA colonization surveillance program. It is not intended to diagnose MRSA infection nor to guide or monitor treatment for MRSA infections. RESULT CALLED TO, READ BACK BY AND VERIFIED WITH: MCMILLIAN,S AT 2005 ON 02/13/2018 BY MOSLEY,J Performed at [facility], [address], [place], [place] [address]   Culture, blood (Routine X 2) w Reflex to ID Panel     Status: Abnormal   Collection Time: 01/18/18  9:48 AM  Result Value Ref Range  Status   Specimen Description   Final    BLOOD LEFT HAND Performed at [facility], [address] [place], [place] [address]    Special Requests   Final    BOTTLES DRAWN AEROBIC ONLY Blood Culture results may not be optimal due to an inadequate volume of blood received in culture bottles Performed at [facility], [address], [place], [place] [address]    Culture  Setup Time   Final    AEROBIC BOTTLE ONLY GRAM POSITIVE COCCI CRITICAL VALUE NOTED.  VALUE IS CONSISTENT WITH PREVIOUSLY REPORTED AND CALLED VALUE.    Culture (A)  Final    STAPHYLOCOCCUS AUREUS SUSCEPTIBILITIES PERFORMED ON PREVIOUS CULTURE WITHIN THE LAST 5 DAYS. Performed at [facility], [address] [place], [place] [address]    Report Status 01/21/2018 FINAL  Final  Culture, blood (Routine X 2) w Reflex to ID Panel     Status: Abnormal   Collection Time: 01/18/18  9:58 AM  Result Value Ref Range Status   Specimen Description   Final    BLOOD LEFT ARM Performed at  [facility], [address] [place], [place] [address]    Special Requests   Final    BOTTLES DRAWN AEROBIC ONLY Blood Culture results may not be optimal due to an inadequate volume of blood received in culture bottles Performed at [facility], [address] [place], [place] [address]    Culture  Setup Time   Final    AEROBIC BOTTLE ONLY GRAM POSITIVE COCCI CRITICAL VALUE NOTED.  VALUE IS CONSISTENT WITH PREVIOUSLY REPORTED AND CALLED VALUE.    Culture (A)  Final    STAPHYLOCOCCUS AUREUS SUSCEPTIBILITIES PERFORMED ON PREVIOUS CULTURE WITHIN THE LAST 5 DAYS. Performed at [facility], [address] [place], [place] [address]    Report Status 01/21/2018 FINAL  Final  Culture, blood (Routine X 2) w Reflex to ID Panel     Status: None (Preliminary result)   Collection Time: 01/19/18  8:25 AM  Result Value Ref Range Status   Specimen Description BLOOD  BLOOD RIGHT HAND  Final   Special Requests   Final    BOTTLES DRAWN AEROBIC ONLY Blood Culture results may not be optimal due to an inadequate volume of blood received in culture bottles   Culture   Final    NO GROWTH 4 DAYS Performed at [facility], [address] [place], [place] [address]    Report Status PENDING  Incomplete  Culture, blood (Routine X 2) w Reflex to ID Panel     Status: None (Preliminary result)   Collection Time: 01/19/18  8:26 AM  Result Value Ref Range Status   Specimen Description   Final    BLOOD LEFT HAND Performed at [facility], [address] [place], [place] [address]    Special Requests   Final    BOTTLES DRAWN AEROBIC ONLY Blood Culture adequate volume Performed at [facility], [address] [place], [place] [address]    Culture   Final    NO GROWTH 4 DAYS Performed at [facility], [address] [place], [place] [address]    Report Status PENDING  Incomplete     Labs: Basic Metabolic Panel: Recent Labs  Lab 01/18/18 0253 01/19/18 0445 01/20/18 0402 01/21/18 0412 01/22/18 1312  NA 158* 157* 153* 153* 160*  K 5.0 4.3 3.8 4.7 3.6  CL 121* 125* 119* 121* 128*  CO2 20* 17* 20* 14* 17*  GLUCOSE 128* 215* 150* 127* 73  BUN 86* 86* 77* 70* 69*  CREATININE 5.55* 5.05* 4.53* 4.54* 4.20*  CALCIUM 8.3* 8.1* 8.3* 8.7* 8.1*  MG 2.4  --   --   --   --    Liver Function Tests: Recent Labs  Lab 02/12/2018 1217 01/18/18 0253  AST 80* 74*  ALT 59* 47*  ALKPHOS 259* 184*  BILITOT 0.7 0.4  PROT 8.0 6.5  ALBUMIN 2.5* 2.1*   Recent Labs  Lab 02/13/2018 1217  LIPASE 21   No results for input(s): AMMONIA in the last 168 hours. CBC: Recent Labs  Lab 02/09/2018 1217 01/18/18 0253  WBC 14.9* 9.5  NEUTROABS 12.9*  --   HGB 12.8* 10.4*  HCT 41.2 33.1*  MCV 83.1 80.9  PLT 387 288   Cardiac Enzymes: No results for input(s): CKTOTAL, CKMB, CKMBINDEX, TROPONINI in the last 168  hours. D-Dimer No results for input(s): DDIMER in the last 72 hours. BNP: Invalid input(s): POCBNP CBG: Recent Labs  Lab 01/22/18 0728 01/22/18 1127 01/22/18  1702 01/22/18 1742 01/22/18 1944  GLUCAP 80 79 64* 108* 119*   Anemia work up No results for input(s): VITAMINB12, FOLATE, FERRITIN, TIBC, IRON, RETICCTPCT in the last 72 hours. Urinalysis    Component Value Date/Time   COLORURINE AMBER (A) 01/31/2018 1507   APPEARANCEUR CLEAR 01/22/2018 1507   LABSPEC 1.014 01/19/2018 1507   PHURINE 5.0 02/07/2018 1507   GLUCOSEU NEGATIVE 02/16/2018 1507   HGBUR SMALL (A) 02/12/2018 1507   BILIRUBINUR NEGATIVE 01/30/2018 1507   KETONESUR NEGATIVE 02/12/2018 1507   PROTEINUR NEGATIVE 01/30/2018 1507   NITRITE NEGATIVE 02/02/2018 1507   LEUKOCYTESUR NEGATIVE 01/26/2018 1507   Sepsis Labs Invalid input(s): PROCALCITONIN,  WBC,  LACTICIDVEN     SIGNED:  Briant Cedar, MD  [facility] Feb 18, 2018, 6:07 PM Pager   If 7PM-7AM, please contact night-coverage [email or web address] Password [email or web address]

## 2018-02-17 NOTE — Progress Notes (Signed)
Pt determined to be deceased at 0715, verified by Sondra ComeVera J. RN and Consuello ClossKim O. RN. Assessment found patient without a pulse and respirations. Pts sister, Osborne OmanClementine Kham was notified by phone. She provided the information of [facility], [place], [place] [phone]. Dr. Sharolyn DouglasEzenduka, text paged. Sister confirmed her arrival on the unit by 10 am.

## 2018-02-17 DEATH — deceased

## 2018-03-19 NOTE — Progress Notes (Signed)
Opened in error

## 2018-06-20 IMAGING — DX DG ABDOMEN 2V
3 series · 3 of 3 positions shown · non-contrast
Comparison: None.

CLINICAL DATA: Abdominal distention and constipation.

EXAM:
ABDOMEN - 2 VIEW

[abdomen erect]
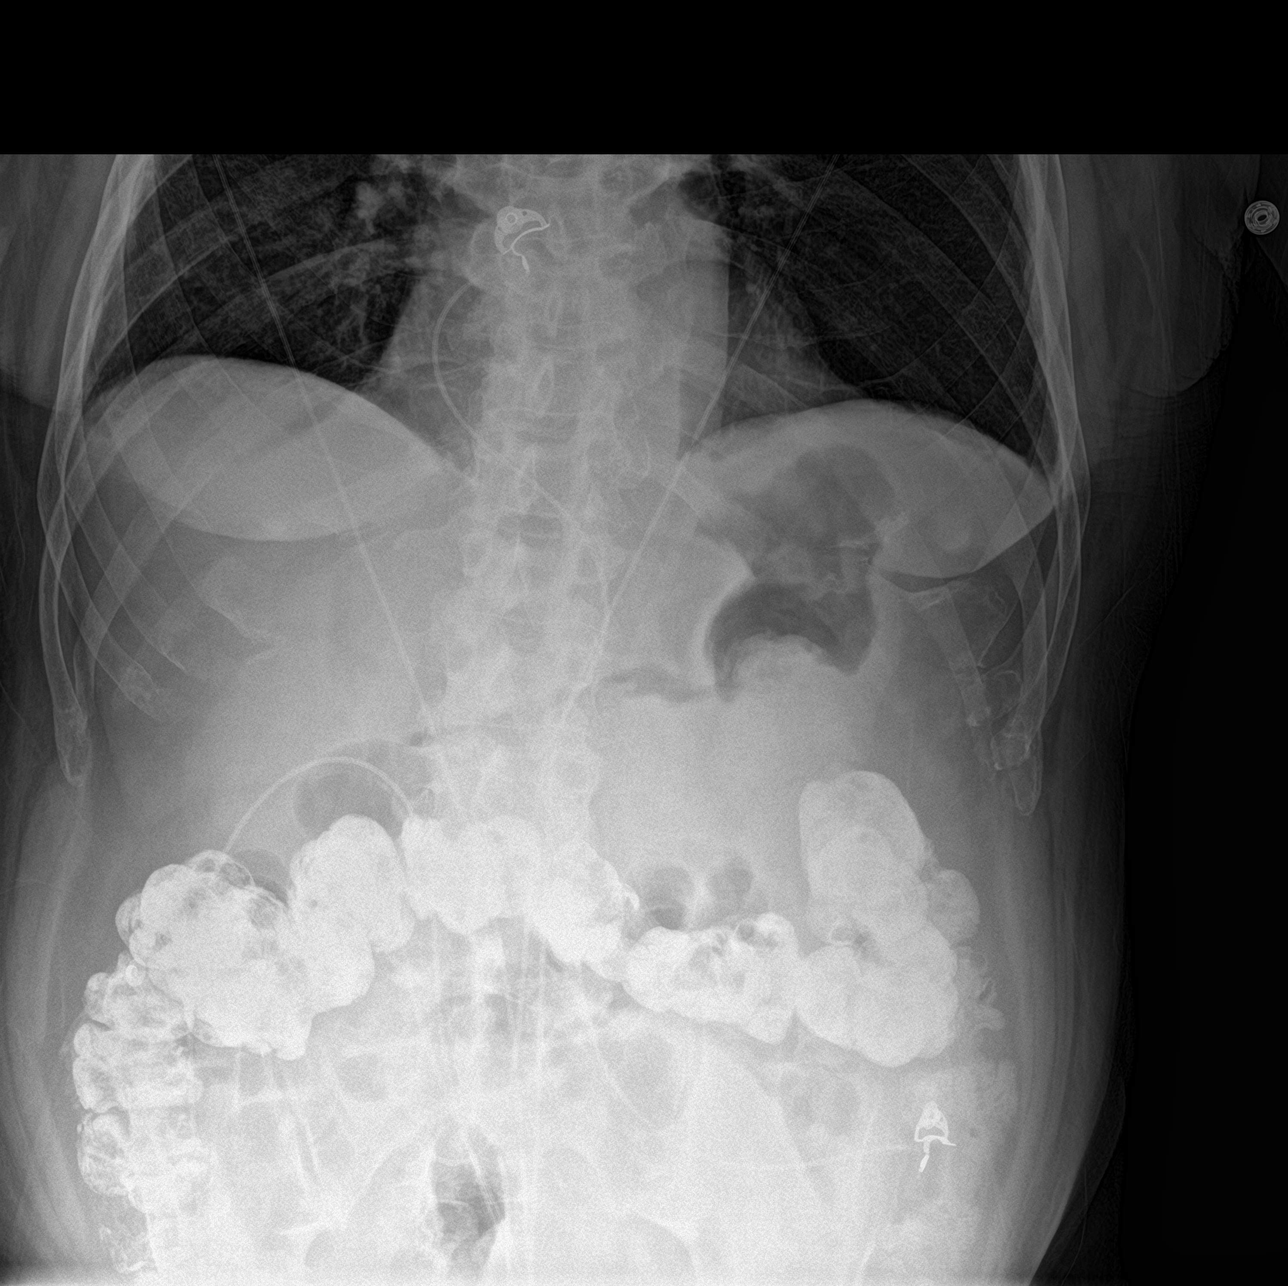

[abdomen supine (1 of 2)]
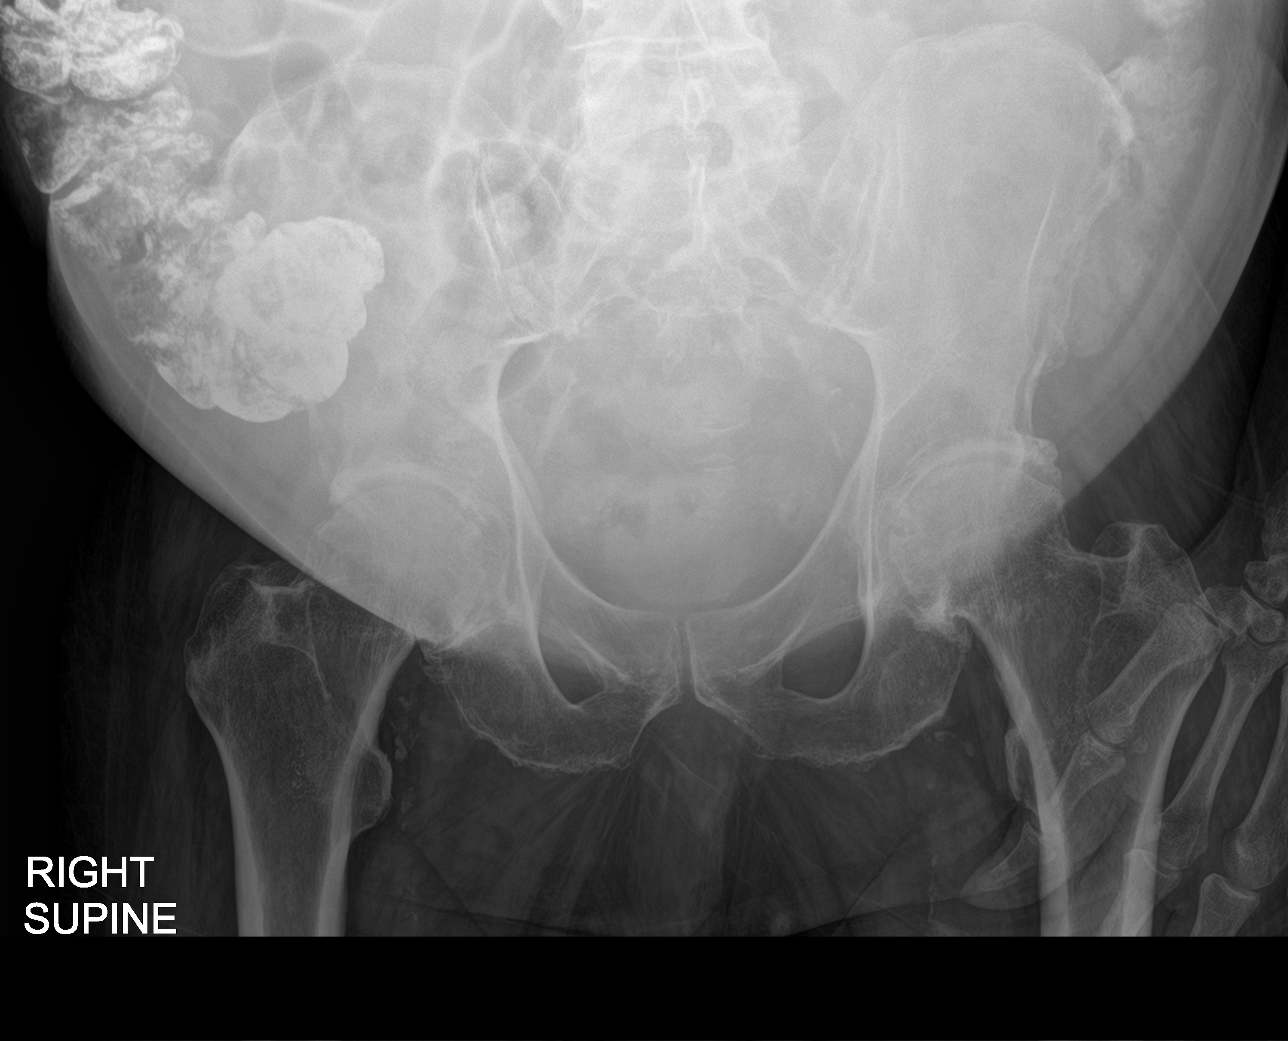

[abdomen supine (2 of 2)]
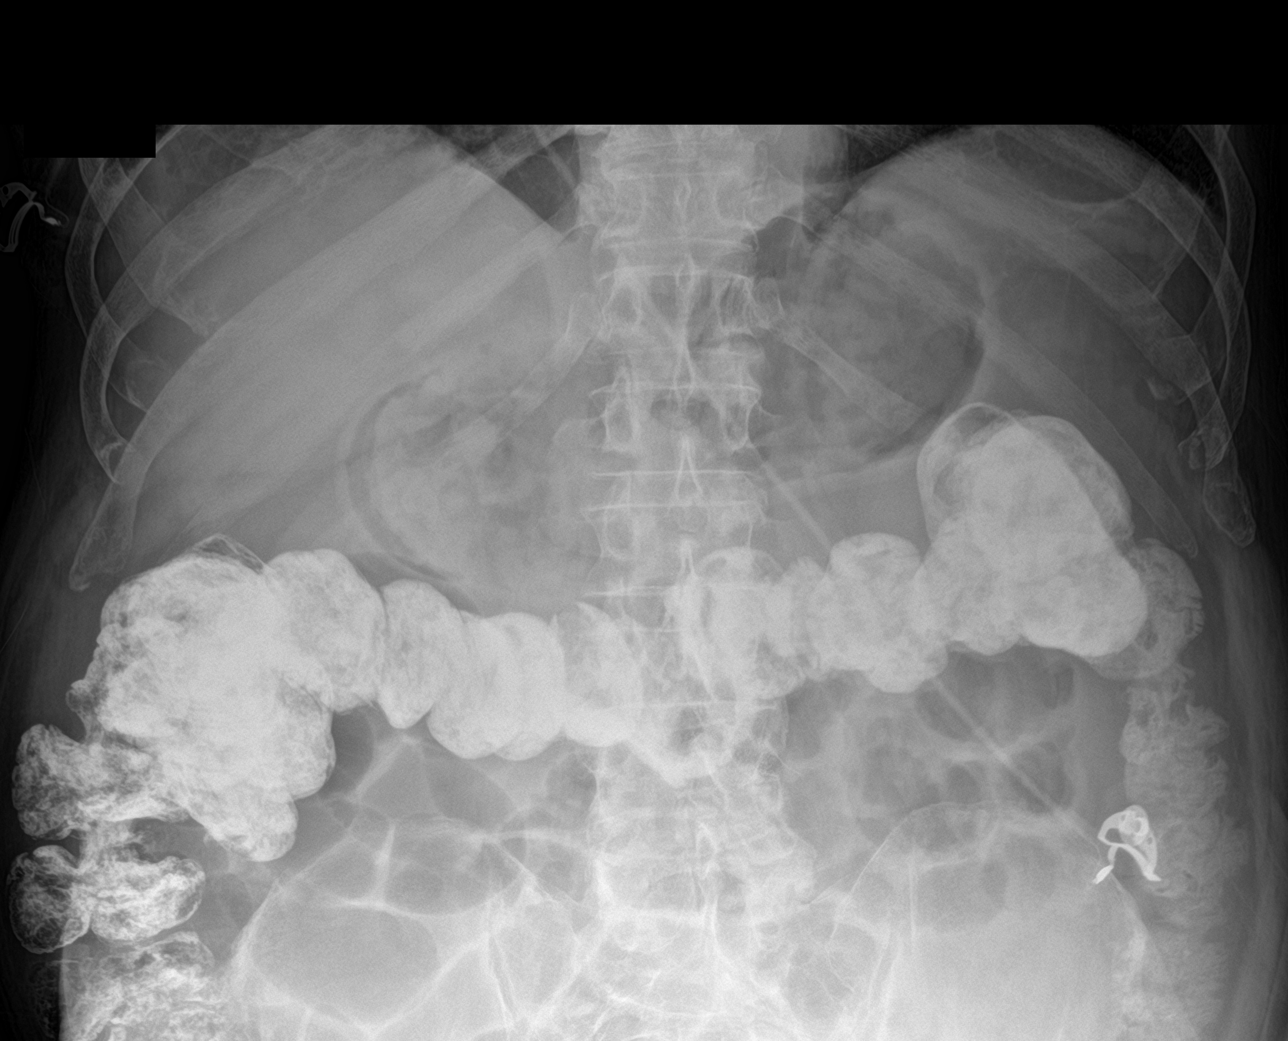

[3 of 3 positions shown; findings below may reference images not displayed]

FINDINGS: The lung bases are normal. Contrast seen throughout the colon. No
bowel obstruction. No free air, portal venous gas, or pneumatosis.
IMPRESSION: Negative.

## 2018-06-20 IMAGING — DX DG CHEST 1V PORT
2 series · 2 of 2 positions shown · non-contrast
Comparison: 11/12/2017.

CLINICAL DATA: Dyspnea.  History of CHF and COPD.  Follow-up exam.

EXAM:
PORTABLE CHEST 1 VIEW

[chest ap (1 of 2)]
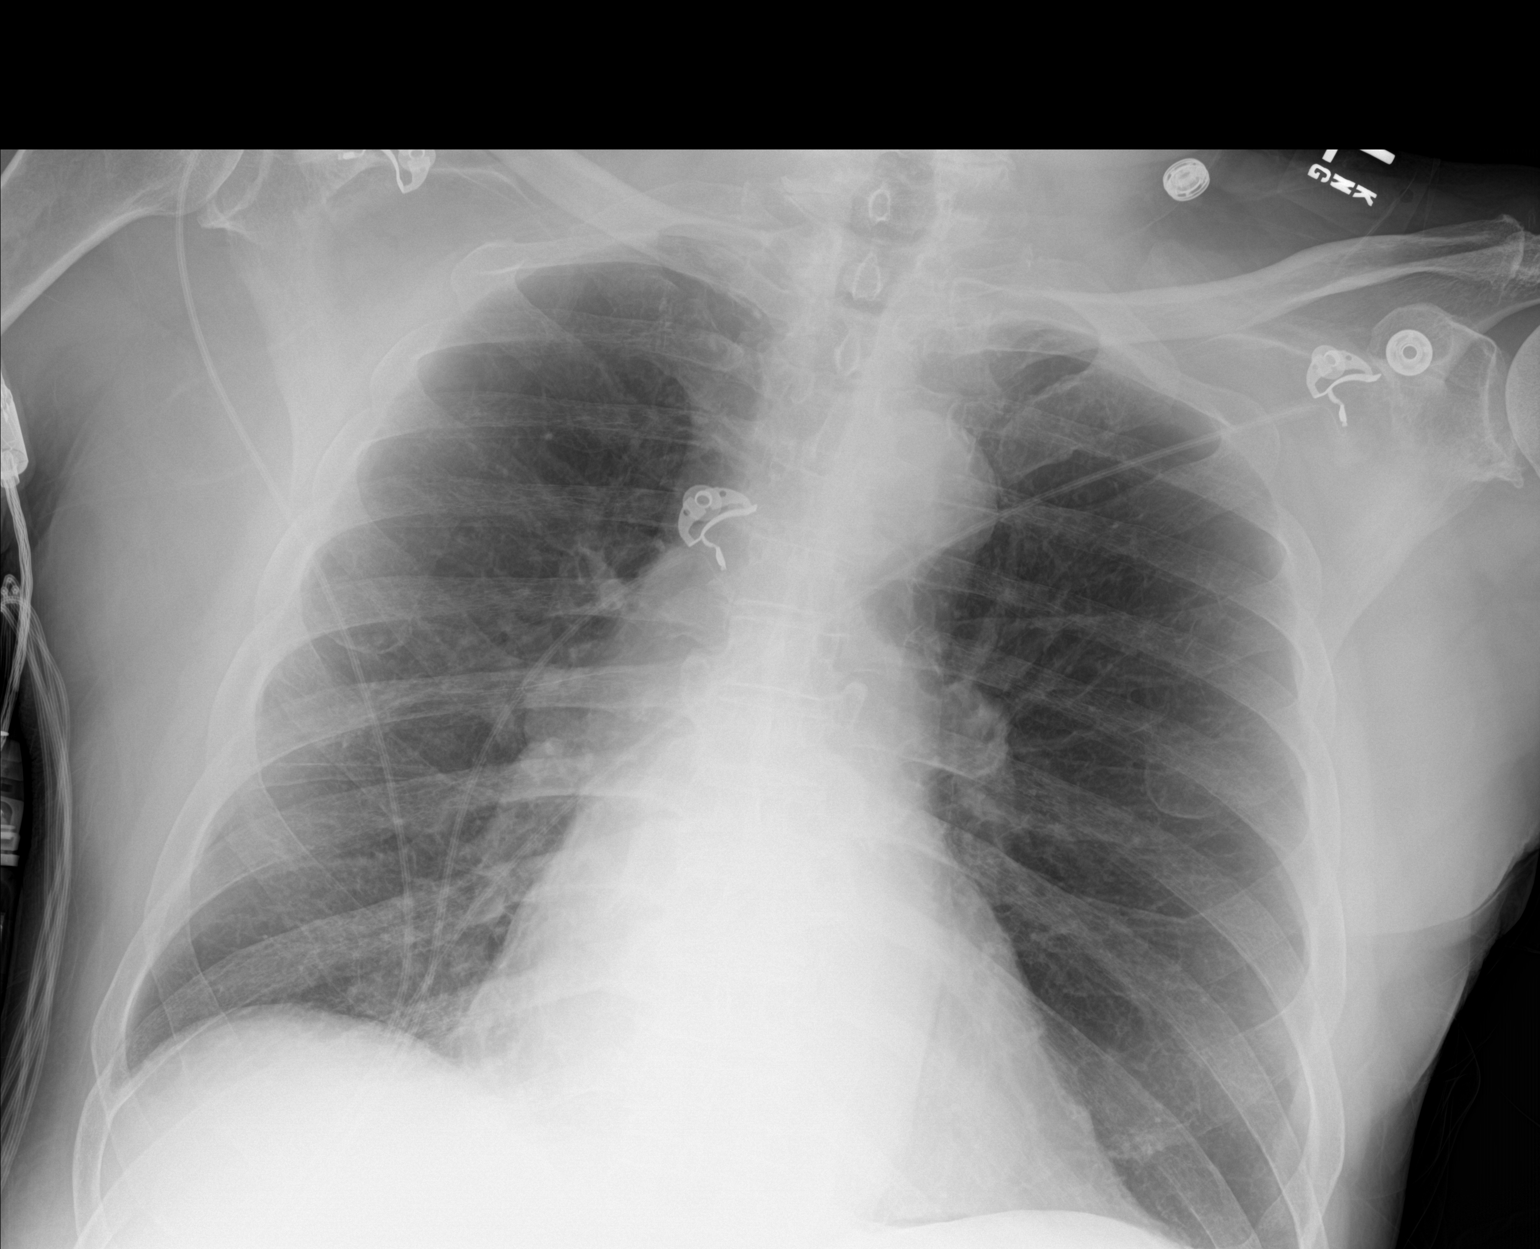

[chest ap (2 of 2)]
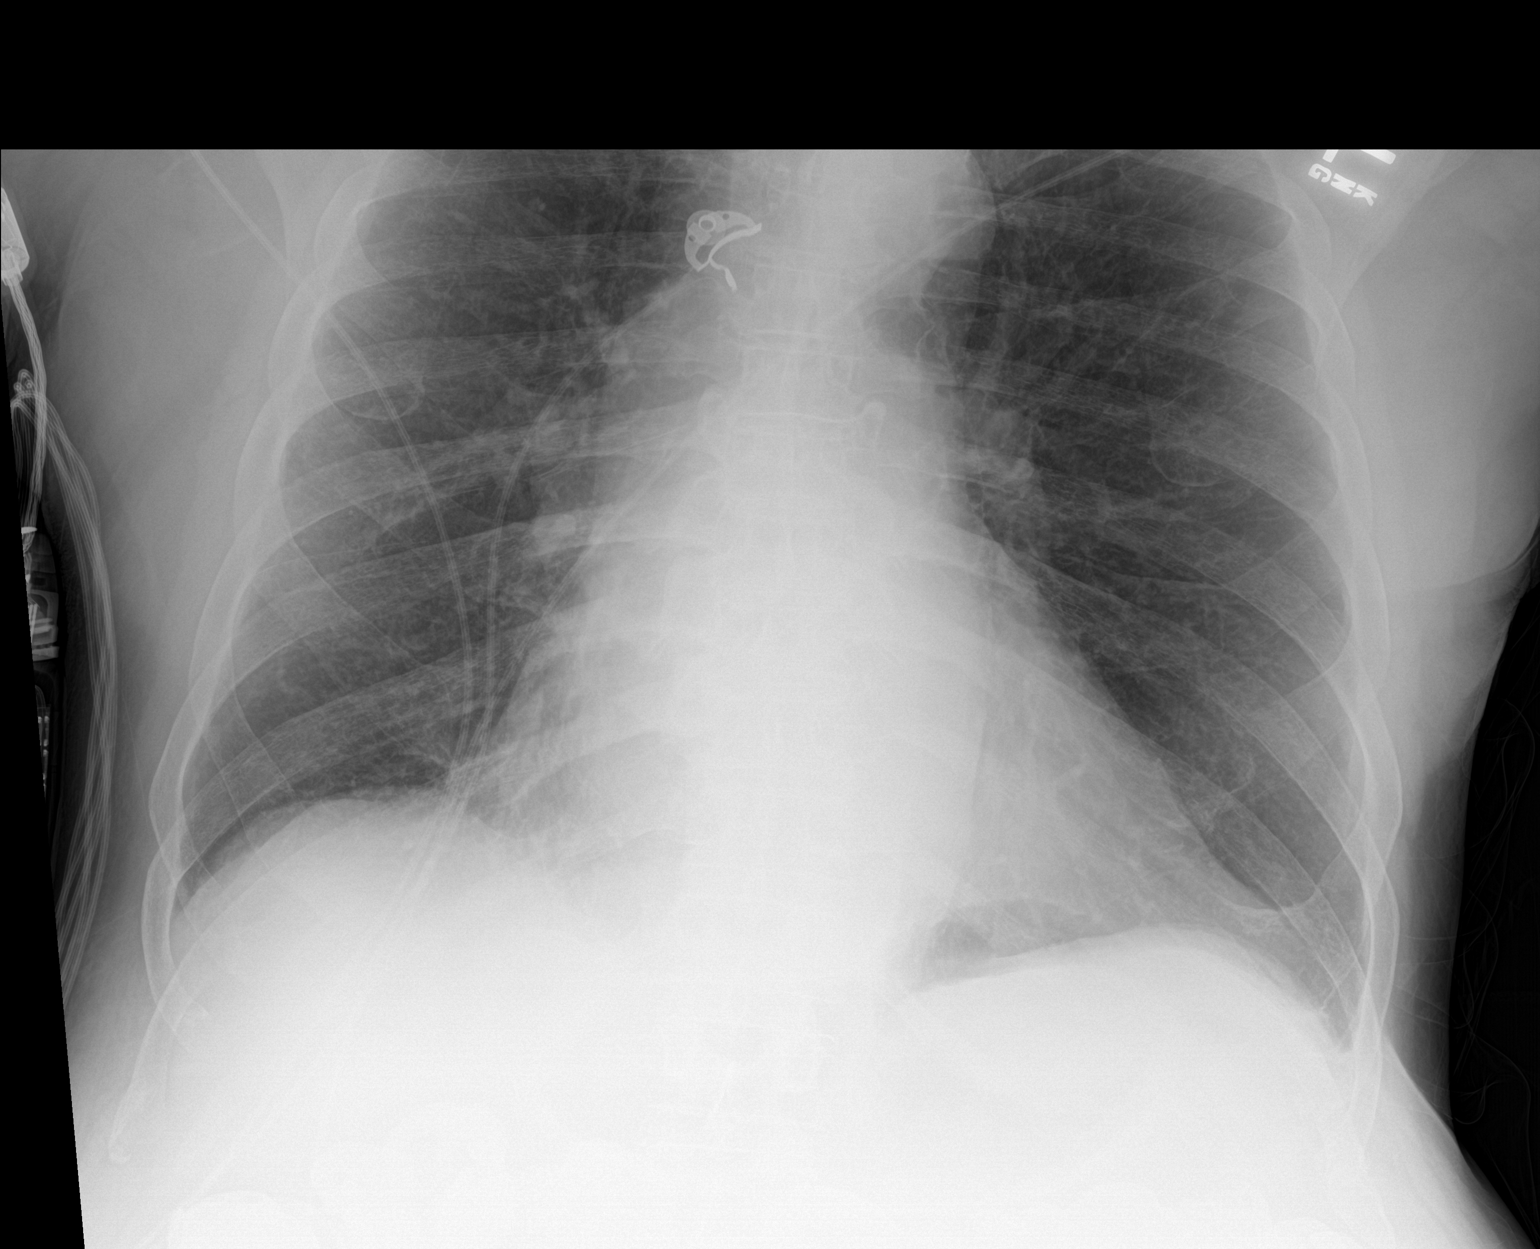

[2 of 2 positions shown; findings below may reference images not displayed]

FINDINGS: Right mid and lower lung opacities have improved from the prior
exam. Remainder of the lungs are clear. Lungs are hyperexpanded.

Cardiac silhouette is top-normal in size. No mediastinal or hilar
masses.

No pleural effusion.  No pneumothorax.
IMPRESSION: 1. Improved airspace lung opacities in the right mid and lower lung
consistent with improved pneumonia.
2. No new lung abnormalities.
3. COPD.
